# Patient Record
Sex: Female | Born: 1935
Health system: Southern US, Community
[De-identification: ages and names within clinical notes are randomized; demographics above are authoritative.]

## PROBLEM LIST (undated history)

## (undated) DIAGNOSIS — M1990 Unspecified osteoarthritis, unspecified site: Secondary | ICD-10-CM

## (undated) DIAGNOSIS — I5022 Chronic systolic (congestive) heart failure: Secondary | ICD-10-CM

## (undated) DIAGNOSIS — N289 Disorder of kidney and ureter, unspecified: Secondary | ICD-10-CM

## (undated) DIAGNOSIS — N183 Chronic kidney disease, stage 3 unspecified: Secondary | ICD-10-CM

## (undated) DIAGNOSIS — K219 Gastro-esophageal reflux disease without esophagitis: Secondary | ICD-10-CM

## (undated) DIAGNOSIS — K5710 Diverticulosis of small intestine without perforation or abscess without bleeding: Secondary | ICD-10-CM

## (undated) DIAGNOSIS — I480 Paroxysmal atrial fibrillation: Secondary | ICD-10-CM

## (undated) DIAGNOSIS — R569 Unspecified convulsions: Secondary | ICD-10-CM

## (undated) DIAGNOSIS — E119 Type 2 diabetes mellitus without complications: Secondary | ICD-10-CM

## (undated) DIAGNOSIS — E669 Obesity, unspecified: Secondary | ICD-10-CM

## (undated) DIAGNOSIS — I10 Essential (primary) hypertension: Secondary | ICD-10-CM

## (undated) DIAGNOSIS — R600 Localized edema: Secondary | ICD-10-CM

## (undated) DIAGNOSIS — K5790 Diverticulosis of intestine, part unspecified, without perforation or abscess without bleeding: Secondary | ICD-10-CM

## (undated) DIAGNOSIS — K8050 Calculus of bile duct without cholangitis or cholecystitis without obstruction: Secondary | ICD-10-CM

## (undated) DIAGNOSIS — M8580 Other specified disorders of bone density and structure, unspecified site: Secondary | ICD-10-CM

## (undated) DIAGNOSIS — D126 Benign neoplasm of colon, unspecified: Secondary | ICD-10-CM

## (undated) DIAGNOSIS — Z8719 Personal history of other diseases of the digestive system: Secondary | ICD-10-CM

## (undated) HISTORY — DX: Diverticulosis of intestine, part unspecified, without perforation or abscess without bleeding: K57.90

## (undated) HISTORY — DX: Chronic kidney disease, stage 3 (moderate): N18.3

## (undated) HISTORY — DX: Chronic kidney disease, stage 3 unspecified: N18.30

## (undated) HISTORY — DX: Localized edema: R60.0

## (undated) HISTORY — DX: Benign neoplasm of colon, unspecified: D12.6

## (undated) HISTORY — DX: Gastro-esophageal reflux disease without esophagitis: K21.9

## (undated) HISTORY — DX: Essential (primary) hypertension: I10

## (undated) HISTORY — DX: Obesity, unspecified: E66.9

## (undated) HISTORY — PX: APPENDECTOMY: SHX54

## (undated) HISTORY — DX: Unspecified convulsions: R56.9

## (undated) HISTORY — DX: Type 2 diabetes mellitus without complications: E11.9

## (undated) HISTORY — PX: TONSILLECTOMY: SUR1361

## (undated) HISTORY — DX: Other specified disorders of bone density and structure, unspecified site: M85.80

## (undated) HISTORY — PX: ABDOMINAL HYSTERECTOMY: SHX81

---

## 1975-07-05 HISTORY — PX: CHOLECYSTECTOMY OPEN: SUR202

## 1998-05-20 ENCOUNTER — Ambulatory Visit (HOSPITAL_COMMUNITY): Admission: RE | Admit: 1998-05-20 | Discharge: 1998-05-20 | Payer: Self-pay | Admitting: Family Medicine

## 1999-07-13 ENCOUNTER — Encounter: Admission: RE | Admit: 1999-07-13 | Discharge: 1999-07-13 | Payer: Self-pay | Admitting: Family Medicine

## 1999-07-13 ENCOUNTER — Encounter: Payer: Self-pay | Admitting: Family Medicine

## 2001-07-19 ENCOUNTER — Encounter: Admission: RE | Admit: 2001-07-19 | Discharge: 2001-07-19 | Payer: Self-pay | Admitting: Family Medicine

## 2001-07-19 ENCOUNTER — Encounter: Payer: Self-pay | Admitting: Family Medicine

## 2001-11-30 ENCOUNTER — Encounter: Payer: Self-pay | Admitting: Family Medicine

## 2001-11-30 ENCOUNTER — Encounter: Admission: RE | Admit: 2001-11-30 | Discharge: 2001-11-30 | Payer: Self-pay | Admitting: Family Medicine

## 2002-05-14 ENCOUNTER — Encounter: Admission: RE | Admit: 2002-05-14 | Discharge: 2002-05-14 | Payer: Self-pay | Admitting: Family Medicine

## 2002-05-14 ENCOUNTER — Encounter: Payer: Self-pay | Admitting: Family Medicine

## 2003-07-17 ENCOUNTER — Encounter: Admission: RE | Admit: 2003-07-17 | Discharge: 2003-07-17 | Payer: Self-pay | Admitting: Family Medicine

## 2004-08-17 ENCOUNTER — Encounter: Admission: RE | Admit: 2004-08-17 | Discharge: 2004-08-17 | Payer: Self-pay | Admitting: Family Medicine

## 2005-09-14 ENCOUNTER — Encounter: Admission: RE | Admit: 2005-09-14 | Discharge: 2005-09-14 | Payer: Self-pay | Admitting: Family Medicine

## 2005-09-30 ENCOUNTER — Encounter: Admission: RE | Admit: 2005-09-30 | Discharge: 2005-09-30 | Payer: Self-pay | Admitting: Orthopaedic Surgery

## 2005-10-14 ENCOUNTER — Encounter: Admission: RE | Admit: 2005-10-14 | Discharge: 2005-10-14 | Payer: Self-pay | Admitting: Orthopaedic Surgery

## 2005-10-19 ENCOUNTER — Encounter: Admission: RE | Admit: 2005-10-19 | Discharge: 2005-10-19 | Payer: Self-pay | Admitting: Orthopaedic Surgery

## 2005-11-02 ENCOUNTER — Encounter: Admission: RE | Admit: 2005-11-02 | Discharge: 2005-11-02 | Payer: Self-pay | Admitting: Orthopaedic Surgery

## 2006-09-14 ENCOUNTER — Encounter: Admission: RE | Admit: 2006-09-14 | Discharge: 2006-09-14 | Payer: Self-pay | Admitting: Family Medicine

## 2007-06-09 ENCOUNTER — Encounter: Admission: RE | Admit: 2007-06-09 | Discharge: 2007-06-09 | Payer: Self-pay | Admitting: Family Medicine

## 2007-07-05 DIAGNOSIS — D126 Benign neoplasm of colon, unspecified: Secondary | ICD-10-CM

## 2007-07-05 HISTORY — DX: Benign neoplasm of colon, unspecified: D12.6

## 2007-10-09 ENCOUNTER — Ambulatory Visit: Payer: Self-pay | Admitting: Gastroenterology

## 2007-11-01 ENCOUNTER — Ambulatory Visit: Payer: Self-pay | Admitting: Gastroenterology

## 2007-11-01 ENCOUNTER — Encounter: Payer: Self-pay | Admitting: Gastroenterology

## 2007-11-07 ENCOUNTER — Encounter: Payer: Self-pay | Admitting: Gastroenterology

## 2008-11-27 ENCOUNTER — Ambulatory Visit: Payer: Self-pay

## 2008-12-09 ENCOUNTER — Ambulatory Visit: Payer: Self-pay | Admitting: Family Medicine

## 2009-06-22 ENCOUNTER — Ambulatory Visit: Payer: Self-pay | Admitting: Family Medicine

## 2012-10-10 ENCOUNTER — Encounter: Payer: Self-pay | Admitting: Gastroenterology

## 2013-09-10 ENCOUNTER — Telehealth: Payer: Self-pay | Admitting: Cardiovascular Disease

## 2013-09-10 ENCOUNTER — Ambulatory Visit (INDEPENDENT_AMBULATORY_CARE_PROVIDER_SITE_OTHER): Payer: Commercial Managed Care - HMO | Admitting: Cardiovascular Disease

## 2013-09-10 ENCOUNTER — Encounter: Payer: Self-pay | Admitting: Cardiovascular Disease

## 2013-09-10 VITALS — BP 142/70 | HR 84 | Ht 62.0 in | Wt 290.0 lb

## 2013-09-10 DIAGNOSIS — I10 Essential (primary) hypertension: Secondary | ICD-10-CM | POA: Insufficient documentation

## 2013-09-10 DIAGNOSIS — R609 Edema, unspecified: Secondary | ICD-10-CM

## 2013-09-10 DIAGNOSIS — R600 Localized edema: Secondary | ICD-10-CM | POA: Insufficient documentation

## 2013-09-10 DIAGNOSIS — E119 Type 2 diabetes mellitus without complications: Secondary | ICD-10-CM

## 2013-09-10 MED ORDER — FUROSEMIDE 20 MG PO TABS: 20.0000 mg | ORAL_TABLET | Freq: Every day | ORAL | Status: DC

## 2013-09-10 MED ORDER — POTASSIUM CHLORIDE ER 10 MEQ PO TBCR: 10.0000 meq | EXTENDED_RELEASE_TABLET | Freq: Every day | ORAL | Status: DC

## 2013-09-10 NOTE — Progress Notes (Signed)
09/10/2013 Pamela [name]   1935-06-12  [id number]  Primary Physician Orpah Melter, MD Primary Cardiologist: Lorretta Harp MD Renae Gloss   HPI:  Pamela [name] is a delightful 79 year old severely overweight married Caucasian female mother of 2 children, grandmother and 2 grandchildren referred by Dr. Trellis Paganini at [facility] for cardiovascular evaluation of left greater than right lower extremity edema. Her cardiac risk factor profile is remarkable for treated diabetes and hypertension. Otherwise she is a remote smoker having stopped 25 years ago. There is no family history of heart disease. She's never had a heart attack or stroke. She denies chest pain but does get some dyspnea on exertion. She developed left greater than right lower extremity edema over the last 3-4 months along with some dyspnea. She also has a superimposed cellulitis on the left which is somewhat pruritic. Dr. Geroge Baseman treated her with an oral antibiotic and compression stockings. She does admit to some dietary indiscretion with regards to salt.   Current Outpatient Prescriptions  Medication Sig Dispense Refill  . Ascorbic Acid (VITAMIN C) 1000 MG tablet Take 1,000 mg by mouth daily.      . Calcium Carb-Cholecalciferol (CALCIUM + D3 PO) Take 1,000 mg by mouth daily.      Marland Kitchen losartan-hydrochlorothiazide (HYZAAR) 50-12.5 MG per tablet Take 1 tablet by mouth daily.      . Omega-3 Fatty Acids (FISH OIL PO) Take 2,000 mg by mouth 2 (two) times daily.      . pioglitazone-metformin (ACTOPLUS MET) 15-500 MG per tablet Take 1 tablet by mouth 2 (two) times daily with a meal.       No current facility-administered medications for this visit.    Not on File  History   Social History  . Marital Status: Married    Spouse Name: N/A    Number of Children: N/A  . Years of Education: N/A   Occupational History  . Not on file.   Social History Main Topics  . Smoking status: Former Research scientist (life sciences)  .  Smokeless tobacco: Not on file  . Alcohol Use: Not on file  . Drug Use: Not on file  . Sexual Activity: Not on file   Other Topics Concern  . Not on file   Social History Narrative  . No narrative on file     Review of Systems: General: negative for chills, fever, night sweats or weight changes.  Cardiovascular: negative for chest pain, dyspnea on exertion, edema, orthopnea, palpitations, paroxysmal nocturnal dyspnea or shortness of breath Dermatological: negative for rash Respiratory: negative for cough or wheezing Urologic: negative for hematuria Abdominal: negative for nausea, vomiting, diarrhea, bright red blood per rectum, melena, or hematemesis Neurologic: negative for visual changes, syncope, or dizziness All other systems reviewed and are otherwise negative except as noted above.    Blood pressure 142/70, pulse 84, height _0  (1.575 m), weight 131.543 kg (290 lb).  General appearance: alert and no distress Neck: no adenopathy, no carotid bruit, no JVD, supple, symmetrical, trachea midline and thyroid not enlarged, symmetric, no tenderness/mass/nodules Lungs: clear to auscultation bilaterally Heart: regular rate and rhythm, S1, S2 normal, no murmur, click, rub or gallop Extremities: 3+ edema left, 1-2+ edema on the right. Some penicillins in the left. ddiminished pedal pulses  EKG normal sinus rhythm at 91 without ST or T wave changes  ASSESSMENT AND PLAN:   Essential hypertension Under good control on current medications  Bilateral lower extremity edema The patient was referred by  Dr. Trellis Paganini at [facility]  center for evaluation of left greater than right lower extremity edema with superimposed cellulitis on the left. Edema began 3-4 months ago. She does have some dyspnea on exertion. She denies claudication. Intimate is worse on the left. She does admit to dietary indiscretion with regard to salt. She does have a superimposed cellulitis of the left for which  he was being treated with an antibiotic by Dr. Geroge Baseman. Compression stockings were also prescribed period I'm going to obtain a 2-D echo as well as lower extremity venous Dopplers to rule out DVT and/or reflux. I'm also going to add a low-dose diuretic.      Lorretta Harp MD FACP,FACC,FAHA, [facility] 09/10/2013 9:41 AM

## 2013-09-10 NOTE — Telephone Encounter (Signed)
Returned call and pt verified x 2 w/ Thayer Jew, pt's husband.  Stated pt seems to think the prescription dosages were switched around.  Stated pt should be on 10 mg of one and 20 mg of the other and the way they were sent to pharmacy is wrong.    RN reviewed OV note from today and directions stated pt to take Lasix 20 mg daily and K+ 10 mEq daily.  Confirmed directions w/ Curt Bears, RN and informed pt/husband.  Verbalized understanding and agreed w/ plan.

## 2013-09-10 NOTE — Telephone Encounter (Signed)
Pt went to pick up her medicine it was the wrong prescription.

## 2013-09-10 NOTE — Assessment & Plan Note (Signed)
The patient was referred by Dr. Trellis Paganini at [facility]  center for evaluation of left greater than right lower extremity edema with superimposed cellulitis on the left. Edema began 3-4 months ago. She does have some dyspnea on exertion. She denies claudication. Intimate is worse on the left. She does admit to dietary indiscretion with regard to salt. She does have a superimposed cellulitis of the left for which he was being treated with an antibiotic by Dr. Geroge Baseman. Compression stockings were also prescribed period I'm going to obtain a 2-D echo as well as lower extremity venous Dopplers to rule out DVT and/or reflux. I'm also going to add a low-dose diuretic.

## 2013-09-10 NOTE — Patient Instructions (Signed)
  Your physician wants you to follow-up with him after the tests.                                                                 You will receive a reminder letter in the mail one month in advance. If you don't receive a letter, please call our office to schedule the follow-up appointment.    Your physician has recommended you make the following change in your medication: Start lasix 20mg  daily and potassium choloride 40meg daily.    Your physician has ordered the following tests: echocardiogram and lower extremity venous dopplers with reflux study.

## 2013-09-10 NOTE — Assessment & Plan Note (Signed)
Under good control on current medications 

## 2013-09-19 ENCOUNTER — Ambulatory Visit (HOSPITAL_COMMUNITY)
Admission: RE | Admit: 2013-09-19 | Discharge: 2013-09-19 | Disposition: A | Discharge status: Home / Self Care | Payer: Medicare HMO | Source: Ambulatory Visit | Attending: Cardiovascular Disease | Admitting: Cardiovascular Disease

## 2013-09-19 ENCOUNTER — Ambulatory Visit (HOSPITAL_BASED_OUTPATIENT_CLINIC_OR_DEPARTMENT_OTHER)
Admission: RE | Admit: 2013-09-19 | Discharge: 2013-09-19 | Disposition: A | Discharge status: Home / Self Care | Payer: Medicare HMO | Source: Ambulatory Visit | Attending: Cardiovascular Disease | Admitting: Cardiovascular Disease

## 2013-09-19 DIAGNOSIS — R0989 Other specified symptoms and signs involving the circulatory and respiratory systems: Principal | ICD-10-CM | POA: Insufficient documentation

## 2013-09-19 DIAGNOSIS — R600 Localized edema: Secondary | ICD-10-CM

## 2013-09-19 DIAGNOSIS — I517 Cardiomegaly: Secondary | ICD-10-CM

## 2013-09-19 DIAGNOSIS — R609 Edema, unspecified: Secondary | ICD-10-CM

## 2013-09-19 DIAGNOSIS — R0609 Other forms of dyspnea: Secondary | ICD-10-CM | POA: Insufficient documentation

## 2013-09-19 DIAGNOSIS — M7989 Other specified soft tissue disorders: Secondary | ICD-10-CM

## 2013-09-19 NOTE — Progress Notes (Signed)
2D Echo Performed 09/19/2013    Tashan Kreitzer, RCS  

## 2013-09-19 NOTE — Progress Notes (Signed)
Venous Duplex Lower Ext. Completed. Negative for DVT and venous insuffiencey. Oda Cogan, BS, RDMS, RVT

## 2013-09-23 ENCOUNTER — Other Ambulatory Visit: Payer: Self-pay | Admitting: Dermatology

## 2014-06-06 ENCOUNTER — Encounter: Payer: Self-pay | Admitting: Gastroenterology

## 2014-07-14 ENCOUNTER — Telehealth: Payer: Self-pay

## 2014-07-14 NOTE — Telephone Encounter (Signed)
Prefer office visit with me or APP to evaluate. I don't see any GI records in Epic.

## 2014-07-14 NOTE — Telephone Encounter (Signed)
Last BMI >50.  If pt still over do you want direct hospital appt for procedure or OV?

## 2014-07-16 ENCOUNTER — Encounter: Payer: Self-pay | Admitting: Gastroenterology

## 2014-07-16 ENCOUNTER — Ambulatory Visit: Payer: Medicare HMO | Admitting: Gastroenterology

## 2014-07-25 ENCOUNTER — Encounter: Payer: Medicare HMO | Admitting: Gastroenterology

## 2014-09-03 ENCOUNTER — Encounter: Payer: Self-pay | Admitting: Gastroenterology

## 2014-09-03 ENCOUNTER — Ambulatory Visit (INDEPENDENT_AMBULATORY_CARE_PROVIDER_SITE_OTHER): Payer: Commercial Managed Care - HMO | Admitting: Gastroenterology

## 2014-09-03 VITALS — BP 132/76 | HR 85 | Ht 62.0 in | Wt 285.0 lb

## 2014-09-03 DIAGNOSIS — Z86010 Personal history of colonic polyps: Secondary | ICD-10-CM | POA: Diagnosis not present

## 2014-09-03 DIAGNOSIS — R194 Change in bowel habit: Secondary | ICD-10-CM

## 2014-09-03 NOTE — Progress Notes (Signed)
History of Present Illness: This is a 80 year old female referred by Orpah Melter, MD for a history of adenomatous colon polyps.  She is accompanied by her husband. She relates a change in bowel habits last week with slightly looser stools for several days. Her bowel habits have now returned to their normal pattern. Denies weight loss, abdominal pain, constipation, diarrhea, change in stool caliber, melena, hematochezia, nausea, vomiting, dysphagia, reflux symptoms, chest pain.   Allergies  Allergen Reactions  . Levaquin [Levofloxacin In D5w] Nausea And Vomiting   Outpatient Prescriptions Prior to Visit  Medication Sig Dispense Refill  . Ascorbic Acid (VITAMIN C) 1000 MG tablet Take 1,000 mg by mouth daily.    . Calcium Carb-Cholecalciferol (CALCIUM + D3 PO) Take 1,000 mg by mouth daily.    Marland Kitchen losartan-hydrochlorothiazide (HYZAAR) 50-12.5 MG per tablet Take 1 tablet by mouth daily.    . Omega-3 Fatty Acids (FISH OIL PO) Take 2,000 mg by mouth 2 (two) times daily.    . pioglitazone-metformin (ACTOPLUS MET) 15-500 MG per tablet Take 1 tablet by mouth 2 (two) times daily with a meal.    . potassium chloride (K-DUR) 10 MEQ tablet Take 1 tablet (10 mEq total) by mouth daily. 30 tablet 11  . furosemide (LASIX) 20 MG tablet Take 1 tablet (20 mg total) by mouth daily. 30 tablet 11   No facility-administered medications prior to visit.   Past Medical History  Diagnosis Date  . Bilateral lower extremity edema     left lower extremity cellulitis  . Hypertension   . Diabetes   . Obesity   . Tubular adenoma of colon 2009  . Kidney disease, chronic, stage III (GFR 30-59 ml/min)   . Degenerative joint disease   . GERD (gastroesophageal reflux disease)   . Osteopenia   . Diverticulosis   . Seizures    History reviewed. No pertinent past surgical history. History   Social History  . Marital Status: Married    Spouse Name: N/A  . Number of Children: N/A  . Years of Education: N/A    Social History Main Topics  . Smoking status: Former Research scientist (life sciences)  . Smokeless tobacco: Not on file  . Alcohol Use: No  . Drug Use: No  . Sexual Activity: Not on file   Other Topics Concern  . None   Social History Narrative   History reviewed. No pertinent family history.     Review of Systems: Pertinent positive and negative review of systems were noted in the above HPI section. All other review of systems were otherwise negative.    Physical Exam: General: Well developed, well nourished, obese, no acute distress Head: Normocephalic and atraumatic Eyes:  sclerae anicteric, EOMI Ears: Normal auditory acuity Mouth: No deformity or lesions Neck: Supple, no masses or thyromegaly Lungs: Clear throughout to auscultation Heart: Regular rate and rhythm; no murmurs, rubs or bruits Abdomen: Soft, non tender and non distended. No masses, hepatosplenomegaly or hernias noted. Normal Bowel sounds Rectal: deferred to colonoscopy Musculoskeletal: Symmetrical with no gross deformities  Skin: No lesions on visible extremities Pulses:  Normal pulses noted Extremities: No clubbing, cyanosis, edema or deformities noted Neurological: Alert oriented x 4, grossly nonfocal Cervical Nodes:  No significant cervical adenopathy Inguinal Nodes: No significant inguinal adenopathy Psychological:  Alert and cooperative. Normal mood and affect  Assessment and Recommendations:  1. Personal history of adenomatous colon polyp. We discussed the option of proceeding with surveillance colonoscopy. Given that she is 43 and had  only one small adenomatous polyp a 5-10 year surveillance interval would be reasonable. She would be 83 at the 10 year interval and she has passed the 5 year interval. After considering the options she would like to proceed with colonoscopy. The risks (including bleeding, perforation, infection, missed lesions, medication reactions and possible hospitalization or surgery if complications  occur), benefits, and alternatives to colonoscopy with possible biopsy and possible polypectomy were discussed with the patient and they consent to proceed.   2. Morbid obesity, BMI=52. Encouraged long-term weight loss program supervised by her PCP. Colonoscopy scheduled at the hospital due to BMI over 50.  3. Transient, self-limited change in bowel habits likely related to viral illness or food sensitivity. These symptoms do not warrant colonoscopy.  cc: Dr. Orpah Melter

## 2014-09-03 NOTE — Patient Instructions (Signed)
You have been scheduled for a colonoscopy. Please follow written instructions given to you at your visit today.  Please pick up your prep supplies at the pharmacy within the next 1-3 days. If you use inhalers (even only as needed), please bring them with you on the day of your procedure.  Thank you for choosing me and [facility].  Malcolm T. Stark, Jr., MD., FACG  

## 2014-09-15 ENCOUNTER — Ambulatory Visit (HOSPITAL_BASED_OUTPATIENT_CLINIC_OR_DEPARTMENT_OTHER)
Admission: RE | Admit: 2014-09-15 | Discharge: 2014-09-15 | Disposition: A | Discharge status: Home / Self Care | Payer: Commercial Managed Care - HMO | Source: Ambulatory Visit | Attending: Family Medicine | Admitting: Family Medicine

## 2014-09-15 ENCOUNTER — Other Ambulatory Visit (HOSPITAL_BASED_OUTPATIENT_CLINIC_OR_DEPARTMENT_OTHER): Payer: Self-pay | Admitting: Family Medicine

## 2014-09-15 DIAGNOSIS — R1030 Lower abdominal pain, unspecified: Secondary | ICD-10-CM | POA: Diagnosis not present

## 2014-09-15 DIAGNOSIS — K625 Hemorrhage of anus and rectum: Secondary | ICD-10-CM | POA: Diagnosis not present

## 2014-09-15 DIAGNOSIS — R109 Unspecified abdominal pain: Secondary | ICD-10-CM | POA: Diagnosis not present

## 2014-09-15 DIAGNOSIS — R140 Abdominal distension (gaseous): Secondary | ICD-10-CM | POA: Insufficient documentation

## 2014-09-15 DIAGNOSIS — R194 Change in bowel habit: Secondary | ICD-10-CM | POA: Diagnosis not present

## 2014-09-23 DIAGNOSIS — R194 Change in bowel habit: Secondary | ICD-10-CM | POA: Diagnosis not present

## 2014-10-03 ENCOUNTER — Encounter (HOSPITAL_COMMUNITY): Payer: Self-pay | Admitting: *Deleted

## 2014-10-16 ENCOUNTER — Encounter (HOSPITAL_COMMUNITY): Payer: Self-pay | Admitting: *Deleted

## 2014-10-16 ENCOUNTER — Ambulatory Visit (HOSPITAL_COMMUNITY): Payer: Commercial Managed Care - HMO | Admitting: Anesthesiology

## 2014-10-16 ENCOUNTER — Encounter (HOSPITAL_COMMUNITY)
Admission: RE | Disposition: A | Discharge status: Home / Self Care | Payer: Self-pay | Source: Ambulatory Visit | Attending: Gastroenterology

## 2014-10-16 ENCOUNTER — Ambulatory Visit (HOSPITAL_COMMUNITY)
Admission: RE | Admit: 2014-10-16 | Discharge: 2014-10-16 | Disposition: A | Discharge status: Home / Self Care | Payer: Commercial Managed Care - HMO | Source: Ambulatory Visit | Attending: Gastroenterology | Admitting: Gastroenterology

## 2014-10-16 DIAGNOSIS — K219 Gastro-esophageal reflux disease without esophagitis: Secondary | ICD-10-CM | POA: Insufficient documentation

## 2014-10-16 DIAGNOSIS — N183 Chronic kidney disease, stage 3 (moderate): Secondary | ICD-10-CM | POA: Diagnosis not present

## 2014-10-16 DIAGNOSIS — Z1211 Encounter for screening for malignant neoplasm of colon: Secondary | ICD-10-CM | POA: Diagnosis not present

## 2014-10-16 DIAGNOSIS — K5790 Diverticulosis of intestine, part unspecified, without perforation or abscess without bleeding: Secondary | ICD-10-CM | POA: Diagnosis not present

## 2014-10-16 DIAGNOSIS — Z86010 Personal history of colonic polyps: Secondary | ICD-10-CM | POA: Diagnosis not present

## 2014-10-16 DIAGNOSIS — Z860101 Personal history of adenomatous and serrated colon polyps: Secondary | ICD-10-CM | POA: Insufficient documentation

## 2014-10-16 DIAGNOSIS — E119 Type 2 diabetes mellitus without complications: Secondary | ICD-10-CM | POA: Insufficient documentation

## 2014-10-16 DIAGNOSIS — I129 Hypertensive chronic kidney disease with stage 1 through stage 4 chronic kidney disease, or unspecified chronic kidney disease: Secondary | ICD-10-CM | POA: Diagnosis not present

## 2014-10-16 DIAGNOSIS — K648 Other hemorrhoids: Secondary | ICD-10-CM | POA: Diagnosis not present

## 2014-10-16 DIAGNOSIS — K640 First degree hemorrhoids: Secondary | ICD-10-CM | POA: Insufficient documentation

## 2014-10-16 DIAGNOSIS — K5730 Diverticulosis of large intestine without perforation or abscess without bleeding: Secondary | ICD-10-CM | POA: Diagnosis not present

## 2014-10-16 DIAGNOSIS — Z87891 Personal history of nicotine dependence: Secondary | ICD-10-CM | POA: Insufficient documentation

## 2014-10-16 DIAGNOSIS — Z6841 Body Mass Index (BMI) 40.0 and over, adult: Secondary | ICD-10-CM | POA: Diagnosis not present

## 2014-10-16 DIAGNOSIS — E669 Obesity, unspecified: Secondary | ICD-10-CM | POA: Diagnosis not present

## 2014-10-16 HISTORY — PX: COLONOSCOPY WITH PROPOFOL: SHX5780

## 2014-10-16 LAB — GLUCOSE, CAPILLARY: GLUCOSE-CAPILLARY: 106 mg/dL — AB (ref 70–99)

## 2014-10-16 SURGERY — COLONOSCOPY WITH PROPOFOL
Anesthesia: Monitor Anesthesia Care

## 2014-10-16 MED ORDER — PROPOFOL 10 MG/ML IV BOLUS
INTRAVENOUS | Status: AC
  Filled 2014-10-16: qty 20

## 2014-10-16 MED ORDER — SODIUM CHLORIDE 0.9 % IV SOLN: INTRAVENOUS | Status: DC

## 2014-10-16 MED ORDER — LACTATED RINGERS IV SOLN
INTRAVENOUS | Status: DC
  Administered 2014-10-16: 10:00:00 via INTRAVENOUS
  Administered 2014-10-16: 1000 mL via INTRAVENOUS

## 2014-10-16 MED ORDER — MIDAZOLAM HCL 5 MG/5ML IJ SOLN
INTRAMUSCULAR | Status: DC | PRN
  Administered 2014-10-16 (×2): 1 mg via INTRAVENOUS

## 2014-10-16 MED ORDER — PROPOFOL 10 MG/ML IV BOLUS
INTRAVENOUS | Status: DC | PRN
  Administered 2014-10-16 (×4): 60 mg via INTRAVENOUS

## 2014-10-16 MED ORDER — MIDAZOLAM HCL 2 MG/2ML IJ SOLN
INTRAMUSCULAR | Status: AC
  Filled 2014-10-16: qty 2

## 2014-10-16 SURGICAL SUPPLY — 22 items

## 2014-10-16 NOTE — Op Note (Signed)
[place], [id number]   COLONOSCOPY PROCEDURE REPORT  PATIENT: Pamela [name], Pamela [name]  MR#: [id number] BIRTHDATE: 11-26-1934 , 4  yrs. old GENDER: female ENDOSCOPIST: [name], MD, [facility] REFERRED YT:[id number] Olen Pel, M.D. PROCEDURE DATE:  10/16/2014 PROCEDURE:   Colonoscopy, surveillance First Screening Colonoscopy - Avg.  risk and is 50 yrs.  old or older - No.  Prior Negative Screening - Now for repeat screening. N/A  History of Adenoma - Now for follow-up colonoscopy & has been > or = to 3 yrs.  Yes hx of adenoma.  Has been 3 or more years since last colonoscopy. ASA CLASS:   Class III INDICATIONS:Surveillance due to prior colonic neoplasia and PH Colon Adenoma. MEDICATIONS: Monitored anesthesia care and Per Anesthesia DESCRIPTION OF PROCEDURE:   After the risks benefits and alternatives of the procedure were thoroughly explained, informed consent was obtained.  The digital rectal exam revealed no abnormalities of the rectum.   The Pentax Ped Colon [id number] endoscope was introduced through the anus and advanced to the cecum, which was identified by both the appendix and ileocecal valve. No adverse events experienced.   The quality of the prep was good.  (MoviPrep was used)  The instrument was then slowly withdrawn as the colon was fully examined.    COLON FINDINGS: There was moderate diverticulosis noted in the sigmoid colon and descending colon.   There was mild diverticulosis noted in the transverse colon.   The examination was otherwise normal.  Retroflexed views revealed internal Grade I hemorrhoids. The time to cecum = 2.9 Withdrawal time = 9.2   The scope was withdrawn and the procedure completed. COMPLICATIONS: There were no immediate complications.  ENDOSCOPIC IMPRESSION: 1.   Moderate diverticulosis in the sigmoid colon and descending colon 2.   Mild diverticulosis in the transverse colon 3.   Grade l internal  hemorrhoids  RECOMMENDATIONS: 1.  High fiber diet with liberal fluid intake. 2.  Given your age, you will not need another colonoscopy for colon cancer screening or polyp surveillance.  These types of tests usually stop around the age 16.  eSigned:  [name], MD, [facility] 10/16/2014 10:31 AM

## 2014-10-16 NOTE — Anesthesia Preprocedure Evaluation (Addendum)
Anesthesia Evaluation  Patient identified by MRN, date of birth, ID band Patient awake    Reviewed: Allergy & Precautions, NPO status , Patient's Chart, lab work & pertinent test results  Airway Mallampati: II  TM Distance: >3 FB Neck ROM: Full    Dental   Pulmonary former smoker,  breath sounds clear to auscultation        Cardiovascular hypertension, Rhythm:Regular Rate:Normal     Neuro/Psych Seizures -,     GI/Hepatic negative GI ROS, Neg liver ROS,   Endo/Other  diabetes  Renal/GU Renal disease     Musculoskeletal   Abdominal   Peds  Hematology   Anesthesia Other Findings   Reproductive/Obstetrics                            Anesthesia Physical Anesthesia Plan  ASA: III  Anesthesia Plan: MAC   Post-op Pain Management:    Induction: Intravenous  Airway Management Planned: Simple Face Mask  Additional Equipment:   Intra-op Plan:   Post-operative Plan:   Informed Consent:   Dental advisory given  Plan Discussed with: Anesthesiologist and CRNA  Anesthesia Plan Comments:         Anesthesia Quick Evaluation

## 2014-10-16 NOTE — Transfer of Care (Signed)
Immediate Anesthesia Transfer of Care Note  Patient: Pamela [name]  Procedure(s) Performed: Procedure(s): COLONOSCOPY WITH PROPOFOL (N/A)  Patient Location: PACU and Endoscopy Unit  Anesthesia Type:MAC  Level of Consciousness: awake, oriented, patient cooperative, lethargic and responds to stimulation  Airway & Oxygen Therapy: Patient Spontanous Breathing and Patient connected to face mask oxygen  Post-op Assessment: Report given to RN, Post -op Vital signs reviewed and stable and Patient moving all extremities  Post vital signs: Reviewed and stable  Last Vitals:  Filed Vitals:   10/16/14 0912  BP: 168/68  Temp: 36.5 C  Resp: 20    Complications: No apparent anesthesia complications

## 2014-10-16 NOTE — H&P (Signed)
History of Present Illness: This is a 81 year old female referred by Orpah Melter, MD for a history of adenomatous colon polyps. She is accompanied by her husband. She relates a change in bowel habits last week with slightly looser stools for several days. Her bowel habits have now returned to their normal pattern. Denies weight loss, abdominal pain, constipation, diarrhea, change in stool caliber, melena, hematochezia, nausea, vomiting, dysphagia, reflux symptoms, chest pain.   Allergies  Allergen Reactions  . Levaquin [Levofloxacin In D5w] Nausea And Vomiting   Outpatient Prescriptions Prior to Visit  Medication Sig Dispense Refill  . Ascorbic Acid (VITAMIN C) 1000 MG tablet Take 1,000 mg by mouth daily.    . Calcium Carb-Cholecalciferol (CALCIUM + D3 PO) Take 1,000 mg by mouth daily.    Marland Kitchen losartan-hydrochlorothiazide (HYZAAR) 50-12.5 MG per tablet Take 1 tablet by mouth daily.    . Omega-3 Fatty Acids (FISH OIL PO) Take 2,000 mg by mouth 2 (two) times daily.    . pioglitazone-metformin (ACTOPLUS MET) 15-500 MG per tablet Take 1 tablet by mouth 2 (two) times daily with a meal.    . potassium chloride (K-DUR) 10 MEQ tablet Take 1 tablet (10 mEq total) by mouth daily. 30 tablet 11  . furosemide (LASIX) 20 MG tablet Take 1 tablet (20 mg total) by mouth daily. 30 tablet 11   No facility-administered medications prior to visit.   Past Medical History  Diagnosis Date  . Bilateral lower extremity edema     left lower extremity cellulitis  . Hypertension   . Diabetes   . Obesity   . Tubular adenoma of colon 2009  . Kidney disease, chronic, stage III (GFR 30-59 ml/min)   . Degenerative joint disease   . GERD (gastroesophageal reflux disease)   . Osteopenia   . Diverticulosis   . Seizures    History reviewed. No pertinent past surgical history. History   Social History  . Marital Status:  Married    Spouse Name: N/A  . Number of Children: N/A  . Years of Education: N/A   Social History Main Topics  . Smoking status: Former Research scientist (life sciences)  . Smokeless tobacco: Not on file  . Alcohol Use: No  . Drug Use: No  . Sexual Activity: Not on file   Other Topics Concern  . None   Social History Narrative   History reviewed. No pertinent family history.    Review of Systems: Pertinent positive and negative review of systems were noted in the above HPI section. All other review of systems were otherwise negative.    Physical Exam: General: Well developed, well nourished, obese, no acute distress Head: Normocephalic and atraumatic Eyes: sclerae anicteric, EOMI Ears: Normal auditory acuity Mouth: No deformity or lesions Neck: Supple, no masses or thyromegaly Lungs: Clear throughout to auscultation Heart: Regular rate and rhythm; no murmurs, rubs or bruits Abdomen: Soft, non tender and non distended. No masses, hepatosplenomegaly or hernias noted. Normal Bowel sounds Rectal: deferred to colonoscopy Musculoskeletal: Symmetrical with no gross deformities  Skin: No lesions on visible extremities Pulses: Normal pulses noted Extremities: No clubbing, cyanosis, edema or deformities noted Neurological: Alert oriented x 4, grossly nonfocal Cervical Nodes: No significant cervical adenopathy Inguinal Nodes: No significant inguinal adenopathy Psychological: Alert and cooperative. Normal mood and affect  Assessment and Recommendations:  1. Personal history of adenomatous colon polyp. We discussed the option of proceeding with surveillance colonoscopy. Given that she is 39 and had only one small adenomatous polyp a 5-10 year  surveillance interval would be reasonable. She would be 83 at the 10 year interval and she has passed the 5 year interval. After considering the options she would like to proceed with colonoscopy. The risks (including  bleeding, perforation, infection, missed lesions, medication reactions and possible hospitalization or surgery if complications occur), benefits, and alternatives to colonoscopy with possible biopsy and possible polypectomy were discussed with the patient and they consent to proceed.   2. Morbid obesity, BMI=52. Encouraged long-term weight loss program supervised by her PCP. Colonoscopy scheduled at the hospital due to BMI over 50.  3. Transient, self-limited change in bowel habits likely related to viral illness or food sensitivity. These symptoms do not warrant colonoscopy.

## 2014-10-16 NOTE — Anesthesia Postprocedure Evaluation (Signed)
  Anesthesia Post-op Note  Patient: Pamela [name]  Procedure(s) Performed: Procedure(s): COLONOSCOPY WITH PROPOFOL (N/A)  Patient Location: PACU  Anesthesia Type:MAC  Level of Consciousness: awake  Airway and Oxygen Therapy: Patient Spontanous Breathing  Post-op Pain: mild  Post-op Assessment: Post-op Vital signs reviewed  Post-op Vital Signs: Reviewed  Last Vitals:  Filed Vitals:   10/16/14 1100  BP: 141/62  Pulse: 74  Temp:   Resp: 16    Complications: No apparent anesthesia complications

## 2014-10-17 ENCOUNTER — Encounter (HOSPITAL_COMMUNITY): Payer: Self-pay | Admitting: Gastroenterology

## 2014-10-20 DIAGNOSIS — N390 Urinary tract infection, site not specified: Secondary | ICD-10-CM | POA: Diagnosis not present

## 2014-10-23 DIAGNOSIS — E119 Type 2 diabetes mellitus without complications: Secondary | ICD-10-CM | POA: Diagnosis not present

## 2014-10-23 DIAGNOSIS — I10 Essential (primary) hypertension: Secondary | ICD-10-CM | POA: Diagnosis not present

## 2014-10-23 DIAGNOSIS — R197 Diarrhea, unspecified: Secondary | ICD-10-CM | POA: Diagnosis not present

## 2014-10-23 DIAGNOSIS — R10817 Generalized abdominal tenderness: Secondary | ICD-10-CM | POA: Diagnosis not present

## 2014-10-23 DIAGNOSIS — R1084 Generalized abdominal pain: Secondary | ICD-10-CM | POA: Diagnosis not present

## 2014-10-23 DIAGNOSIS — Z9049 Acquired absence of other specified parts of digestive tract: Secondary | ICD-10-CM | POA: Diagnosis not present

## 2014-10-23 DIAGNOSIS — K529 Noninfective gastroenteritis and colitis, unspecified: Secondary | ICD-10-CM | POA: Diagnosis not present

## 2014-10-23 DIAGNOSIS — I2510 Atherosclerotic heart disease of native coronary artery without angina pectoris: Secondary | ICD-10-CM | POA: Diagnosis not present

## 2014-10-23 DIAGNOSIS — Z881 Allergy status to other antibiotic agents status: Secondary | ICD-10-CM | POA: Diagnosis not present

## 2014-10-27 DIAGNOSIS — R197 Diarrhea, unspecified: Secondary | ICD-10-CM | POA: Diagnosis not present

## 2014-10-27 DIAGNOSIS — N390 Urinary tract infection, site not specified: Secondary | ICD-10-CM | POA: Diagnosis not present

## 2014-11-03 ENCOUNTER — Telehealth: Payer: Self-pay | Admitting: Gastroenterology

## 2014-11-03 NOTE — Telephone Encounter (Signed)
Patient had a colonoscopy with Dr. Fuller Plan on 10/16/14.  Went on a vacation to [place] after that and was seen at an Urgent care for diarrhea and UTI.  She was treated for a UTI for approx 2 weeks and stopped the antibiotics last week Thursday.  She has seen her primary care and he repeated a culture and it was negative.  She reports that she is having bloating, not able to eat, and "I just feel bad".  It was recommended by her primary to come back here.  She will bring the records from the Urgent Cares in [place].  She will ask her primary care to send copies of their office note from last week (not in Care Everywhere).  She will come in and see Tye Savoy RNP tomorrow at 10:30

## 2014-11-04 ENCOUNTER — Encounter: Payer: Self-pay | Admitting: Nurse Practitioner

## 2014-11-04 ENCOUNTER — Ambulatory Visit (INDEPENDENT_AMBULATORY_CARE_PROVIDER_SITE_OTHER): Payer: Commercial Managed Care - HMO | Admitting: Nurse Practitioner

## 2014-11-04 VITALS — BP 120/60 | HR 84 | Ht 62.0 in | Wt 273.1 lb

## 2014-11-04 DIAGNOSIS — R140 Abdominal distension (gaseous): Secondary | ICD-10-CM | POA: Diagnosis not present

## 2014-11-04 MED ORDER — METRONIDAZOLE 500 MG PO TABS: 500.0000 mg | ORAL_TABLET | Freq: Three times a day (TID) | ORAL | Status: DC

## 2014-11-04 NOTE — Patient Instructions (Signed)
We have sent the following medications to your pharmacy for you to pick up at your convenience: Flagyl  Please purchase the following medications over the counter and take as directed: Align  Please call our office in 10 days with an update on your condition.

## 2014-11-04 NOTE — Progress Notes (Signed)
     History of Present Illness:  Patient is a 80 year old female known to Dr. Fuller Plan. She underwent surveillance colonoscopy for history of polyps on 10/16/14. Findings included revealed diverticulosis and grade 1 internal hemorrhoids.   Patient comes in today for evaluation of bowel changes she believes began following colonoscopy. Two days following colonoscopy patient began having diarrhea followed by lower abdominal pain and bloating. She was vacationing in [place], went to an Urgent Care. Diagnosed with UTI and given Macrobid.  Diarrhea and abdominal pain persisted, patient was seen at [facility] ED in in [place] 10/23/14. Plain films of the abdomen were unremarkable (report reviewed). White count normal but mildly elevated absolute neutrophil count of 12 . Creatinine mildly elevated at 1.55. Liver function studies were normal.  Patient was prescribed Lomotil. Of note, urine culture showed 50-100,000 Escherichia coli sensitive to nitrofurantoin. At some point she was also given Bactrim A urine culture by PCP was negative so Bactrim stopped. Patient has had no diarrhea or abdominal pain in 5 days now. Her main complaint is that of bloating. No nausea or vomiting. Her appetite is poor and her back hurts.  Current Medications, Allergies, Past Medical History, Past Surgical History, Family History and Social History were reviewed in [employer] record.  Physical Exam: General: Pleasant, well developed , white female in no acute distress Head: Normocephalic and atraumatic Eyes:  sclerae anicteric, conjunctiva pink  Ears: Normal auditory acuity Lungs: Clear throughout to auscultation Heart: Regular rate and rhythm Abdomen: Soft, obese, non distended, non-tender. No masses, no hepatomegaly. Normal bowel sounds Musculoskeletal: Symmetrical with no gross deformities  Extremities: No edema  Neurological: Alert oriented x 4, grossly nonfocal Psychological:   Alert and cooperative. Normal mood and affect  Assessment and Recommendations:   80 year old female here for evaluation of diarrhea, abdominal pain and bloating following colonoscopy for 10/16/14.  Of note, I have records from PCP in [facility]. She had a negative stool C. difficile toxin and stool culture  09/27/14. Studies predated colonoscopy and patient does now recall that she was having some loose stools prior to colonoscopy. In fact, at her March appointment with [place] she mentioned loose stools. Diarrhea and abdominal pain have resolved but patient complaining of significant bloating.  Etiology of bloating unclear, post-infectious IBS? Patient is bothered enough by the bloating so will treat empirically with metronidazole. Patient will call us in a week to 10 days if bloating.doesn't improve

## 2014-11-05 ENCOUNTER — Encounter: Payer: Self-pay | Admitting: Nurse Practitioner

## 2014-11-05 NOTE — Progress Notes (Signed)
Reviewed and agree with management plan.  Malcolm T. Stark, MD FACG 

## 2014-11-07 ENCOUNTER — Telehealth: Payer: Self-pay | Admitting: Nurse Practitioner

## 2014-11-07 NOTE — Telephone Encounter (Signed)
I reviewed a bland diet with her husband.  They will call back for any additional questions or concerns

## 2014-11-07 NOTE — Telephone Encounter (Signed)
Patient's husband reports that she is not improved on flagyl.  I explained that they need to give the flagyl a few more days.  She has only taken it for 2 full days.  I advised that she try a bland diet and if no better by next Wed please call back.

## 2014-11-07 NOTE — Telephone Encounter (Signed)
Phone line is busy

## 2014-11-09 NOTE — Telephone Encounter (Signed)
Agree. If not better, please get her in to see primary GI doc. Thanks

## 2014-11-14 ENCOUNTER — Telehealth: Payer: Self-pay | Admitting: Nurse Practitioner

## 2014-11-14 NOTE — Telephone Encounter (Signed)
Patient's husband reports that she has improved with flagyl. The bloating has improved but she has no energy.  She took her last dose today.  All the cramping and bloating has resolved.

## 2014-11-17 NOTE — Telephone Encounter (Signed)
Pamela [name], glad GI symptoms better. I don't why she is tired. Thanks

## 2014-11-21 DIAGNOSIS — E1122 Type 2 diabetes mellitus with diabetic chronic kidney disease: Secondary | ICD-10-CM | POA: Diagnosis not present

## 2014-11-21 DIAGNOSIS — R609 Edema, unspecified: Secondary | ICD-10-CM | POA: Diagnosis not present

## 2014-11-21 DIAGNOSIS — E780 Pure hypercholesterolemia: Secondary | ICD-10-CM | POA: Diagnosis not present

## 2014-11-21 DIAGNOSIS — I129 Hypertensive chronic kidney disease with stage 1 through stage 4 chronic kidney disease, or unspecified chronic kidney disease: Secondary | ICD-10-CM | POA: Diagnosis not present

## 2014-11-21 DIAGNOSIS — N183 Chronic kidney disease, stage 3 (moderate): Secondary | ICD-10-CM | POA: Diagnosis not present

## 2014-11-21 DIAGNOSIS — D631 Anemia in chronic kidney disease: Secondary | ICD-10-CM | POA: Diagnosis not present

## 2014-11-21 DIAGNOSIS — N2581 Secondary hyperparathyroidism of renal origin: Secondary | ICD-10-CM | POA: Diagnosis not present

## 2014-12-17 DIAGNOSIS — N2581 Secondary hyperparathyroidism of renal origin: Secondary | ICD-10-CM | POA: Diagnosis not present

## 2014-12-17 DIAGNOSIS — N189 Chronic kidney disease, unspecified: Secondary | ICD-10-CM | POA: Diagnosis not present

## 2014-12-17 DIAGNOSIS — N183 Chronic kidney disease, stage 3 (moderate): Secondary | ICD-10-CM | POA: Diagnosis not present

## 2014-12-29 ENCOUNTER — Other Ambulatory Visit: Payer: Self-pay

## 2014-12-29 DIAGNOSIS — N2581 Secondary hyperparathyroidism of renal origin: Secondary | ICD-10-CM | POA: Diagnosis not present

## 2014-12-29 DIAGNOSIS — D631 Anemia in chronic kidney disease: Secondary | ICD-10-CM | POA: Diagnosis not present

## 2014-12-29 DIAGNOSIS — N183 Chronic kidney disease, stage 3 (moderate): Secondary | ICD-10-CM | POA: Diagnosis not present

## 2014-12-29 DIAGNOSIS — I129 Hypertensive chronic kidney disease with stage 1 through stage 4 chronic kidney disease, or unspecified chronic kidney disease: Secondary | ICD-10-CM | POA: Diagnosis not present

## 2015-01-28 DIAGNOSIS — E780 Pure hypercholesterolemia: Secondary | ICD-10-CM | POA: Diagnosis not present

## 2015-04-07 DIAGNOSIS — Z23 Encounter for immunization: Secondary | ICD-10-CM | POA: Diagnosis not present

## 2015-04-13 DIAGNOSIS — M1990 Unspecified osteoarthritis, unspecified site: Secondary | ICD-10-CM | POA: Diagnosis not present

## 2015-04-13 DIAGNOSIS — M84375A Stress fracture, left foot, initial encounter for fracture: Secondary | ICD-10-CM | POA: Diagnosis not present

## 2015-04-13 DIAGNOSIS — M659 Synovitis and tenosynovitis, unspecified: Secondary | ICD-10-CM | POA: Diagnosis not present

## 2015-04-13 DIAGNOSIS — M84374A Stress fracture, right foot, initial encounter for fracture: Secondary | ICD-10-CM | POA: Diagnosis not present

## 2015-04-21 DIAGNOSIS — M84375D Stress fracture, left foot, subsequent encounter for fracture with routine healing: Secondary | ICD-10-CM | POA: Diagnosis not present

## 2015-04-21 DIAGNOSIS — M659 Synovitis and tenosynovitis, unspecified: Secondary | ICD-10-CM | POA: Diagnosis not present

## 2015-04-27 DIAGNOSIS — E1122 Type 2 diabetes mellitus with diabetic chronic kidney disease: Secondary | ICD-10-CM | POA: Diagnosis not present

## 2015-05-05 DIAGNOSIS — M84375D Stress fracture, left foot, subsequent encounter for fracture with routine healing: Secondary | ICD-10-CM | POA: Diagnosis not present

## 2015-05-05 DIAGNOSIS — M659 Synovitis and tenosynovitis, unspecified: Secondary | ICD-10-CM | POA: Diagnosis not present

## 2015-05-11 DIAGNOSIS — S8991XA Unspecified injury of right lower leg, initial encounter: Secondary | ICD-10-CM | POA: Diagnosis not present

## 2015-05-11 DIAGNOSIS — I129 Hypertensive chronic kidney disease with stage 1 through stage 4 chronic kidney disease, or unspecified chronic kidney disease: Secondary | ICD-10-CM | POA: Diagnosis not present

## 2015-05-11 DIAGNOSIS — S8391XA Sprain of unspecified site of right knee, initial encounter: Secondary | ICD-10-CM | POA: Diagnosis not present

## 2015-05-20 DIAGNOSIS — Z1389 Encounter for screening for other disorder: Secondary | ICD-10-CM | POA: Diagnosis not present

## 2015-05-20 DIAGNOSIS — I129 Hypertensive chronic kidney disease with stage 1 through stage 4 chronic kidney disease, or unspecified chronic kidney disease: Secondary | ICD-10-CM | POA: Diagnosis not present

## 2015-05-20 DIAGNOSIS — R05 Cough: Secondary | ICD-10-CM | POA: Diagnosis not present

## 2015-05-20 DIAGNOSIS — E1122 Type 2 diabetes mellitus with diabetic chronic kidney disease: Secondary | ICD-10-CM | POA: Diagnosis not present

## 2015-05-20 DIAGNOSIS — E7800 Pure hypercholesterolemia, unspecified: Secondary | ICD-10-CM | POA: Diagnosis not present

## 2015-05-20 DIAGNOSIS — N183 Chronic kidney disease, stage 3 (moderate): Secondary | ICD-10-CM | POA: Diagnosis not present

## 2015-05-25 DIAGNOSIS — M84374D Stress fracture, right foot, subsequent encounter for fracture with routine healing: Secondary | ICD-10-CM | POA: Diagnosis not present

## 2015-05-25 DIAGNOSIS — M84375D Stress fracture, left foot, subsequent encounter for fracture with routine healing: Secondary | ICD-10-CM | POA: Diagnosis not present

## 2015-05-25 DIAGNOSIS — M659 Synovitis and tenosynovitis, unspecified: Secondary | ICD-10-CM | POA: Diagnosis not present

## 2015-06-01 DIAGNOSIS — M659 Synovitis and tenosynovitis, unspecified: Secondary | ICD-10-CM | POA: Diagnosis not present

## 2015-06-01 DIAGNOSIS — M84375D Stress fracture, left foot, subsequent encounter for fracture with routine healing: Secondary | ICD-10-CM | POA: Diagnosis not present

## 2015-06-01 DIAGNOSIS — M84374D Stress fracture, right foot, subsequent encounter for fracture with routine healing: Secondary | ICD-10-CM | POA: Diagnosis not present

## 2015-06-22 DIAGNOSIS — M84375D Stress fracture, left foot, subsequent encounter for fracture with routine healing: Secondary | ICD-10-CM | POA: Diagnosis not present

## 2015-06-22 DIAGNOSIS — M7752 Other enthesopathy of left foot: Secondary | ICD-10-CM | POA: Diagnosis not present

## 2015-06-22 DIAGNOSIS — M7751 Other enthesopathy of right foot: Secondary | ICD-10-CM | POA: Diagnosis not present

## 2015-06-22 DIAGNOSIS — M84374D Stress fracture, right foot, subsequent encounter for fracture with routine healing: Secondary | ICD-10-CM | POA: Diagnosis not present

## 2015-06-22 DIAGNOSIS — M659 Synovitis and tenosynovitis, unspecified: Secondary | ICD-10-CM | POA: Diagnosis not present

## 2015-07-01 DIAGNOSIS — R109 Unspecified abdominal pain: Secondary | ICD-10-CM | POA: Diagnosis not present

## 2015-07-20 DIAGNOSIS — M659 Synovitis and tenosynovitis, unspecified: Secondary | ICD-10-CM | POA: Diagnosis not present

## 2015-08-18 DIAGNOSIS — W19XXXA Unspecified fall, initial encounter: Secondary | ICD-10-CM | POA: Diagnosis not present

## 2015-08-18 DIAGNOSIS — M25562 Pain in left knee: Secondary | ICD-10-CM | POA: Diagnosis not present

## 2015-08-18 DIAGNOSIS — S8990XA Unspecified injury of unspecified lower leg, initial encounter: Secondary | ICD-10-CM | POA: Diagnosis not present

## 2015-08-19 DIAGNOSIS — M659 Synovitis and tenosynovitis, unspecified: Secondary | ICD-10-CM | POA: Diagnosis not present

## 2015-08-27 DIAGNOSIS — B029 Zoster without complications: Secondary | ICD-10-CM | POA: Diagnosis not present

## 2015-10-27 DIAGNOSIS — M20011 Mallet finger of right finger(s): Secondary | ICD-10-CM | POA: Diagnosis not present

## 2015-11-03 DIAGNOSIS — E1142 Type 2 diabetes mellitus with diabetic polyneuropathy: Secondary | ICD-10-CM | POA: Diagnosis not present

## 2015-11-03 DIAGNOSIS — M2012 Hallux valgus (acquired), left foot: Secondary | ICD-10-CM | POA: Diagnosis not present

## 2015-11-03 DIAGNOSIS — L84 Corns and callosities: Secondary | ICD-10-CM | POA: Diagnosis not present

## 2015-11-16 DIAGNOSIS — I700 Atherosclerosis of aorta: Secondary | ICD-10-CM | POA: Diagnosis not present

## 2015-11-16 DIAGNOSIS — M8580 Other specified disorders of bone density and structure, unspecified site: Secondary | ICD-10-CM | POA: Diagnosis not present

## 2015-11-16 DIAGNOSIS — I129 Hypertensive chronic kidney disease with stage 1 through stage 4 chronic kidney disease, or unspecified chronic kidney disease: Secondary | ICD-10-CM | POA: Diagnosis not present

## 2015-11-16 DIAGNOSIS — E7800 Pure hypercholesterolemia, unspecified: Secondary | ICD-10-CM | POA: Diagnosis not present

## 2015-11-16 DIAGNOSIS — N183 Chronic kidney disease, stage 3 (moderate): Secondary | ICD-10-CM | POA: Diagnosis not present

## 2015-11-16 DIAGNOSIS — N2581 Secondary hyperparathyroidism of renal origin: Secondary | ICD-10-CM | POA: Diagnosis not present

## 2015-11-16 DIAGNOSIS — E1122 Type 2 diabetes mellitus with diabetic chronic kidney disease: Secondary | ICD-10-CM | POA: Diagnosis not present

## 2015-11-16 DIAGNOSIS — M20011 Mallet finger of right finger(s): Secondary | ICD-10-CM | POA: Diagnosis not present

## 2016-02-02 DIAGNOSIS — R05 Cough: Secondary | ICD-10-CM | POA: Diagnosis not present

## 2016-02-02 DIAGNOSIS — R350 Frequency of micturition: Secondary | ICD-10-CM | POA: Diagnosis not present

## 2016-02-06 DIAGNOSIS — R5383 Other fatigue: Secondary | ICD-10-CM | POA: Diagnosis not present

## 2016-02-06 DIAGNOSIS — F411 Generalized anxiety disorder: Secondary | ICD-10-CM | POA: Diagnosis not present

## 2016-02-08 DIAGNOSIS — K529 Noninfective gastroenteritis and colitis, unspecified: Secondary | ICD-10-CM | POA: Diagnosis not present

## 2016-02-08 DIAGNOSIS — R195 Other fecal abnormalities: Secondary | ICD-10-CM | POA: Diagnosis not present

## 2016-02-08 DIAGNOSIS — E1122 Type 2 diabetes mellitus with diabetic chronic kidney disease: Secondary | ICD-10-CM | POA: Diagnosis not present

## 2016-02-08 DIAGNOSIS — F419 Anxiety disorder, unspecified: Secondary | ICD-10-CM | POA: Diagnosis not present

## 2016-02-08 DIAGNOSIS — R143 Flatulence: Secondary | ICD-10-CM | POA: Diagnosis not present

## 2016-02-08 DIAGNOSIS — R109 Unspecified abdominal pain: Secondary | ICD-10-CM | POA: Diagnosis not present

## 2016-02-09 DIAGNOSIS — R195 Other fecal abnormalities: Secondary | ICD-10-CM | POA: Diagnosis not present

## 2016-02-09 DIAGNOSIS — R109 Unspecified abdominal pain: Secondary | ICD-10-CM | POA: Diagnosis not present

## 2016-02-09 DIAGNOSIS — R143 Flatulence: Secondary | ICD-10-CM | POA: Diagnosis not present

## 2016-02-11 DIAGNOSIS — N289 Disorder of kidney and ureter, unspecified: Secondary | ICD-10-CM | POA: Diagnosis not present

## 2016-02-15 DIAGNOSIS — E1122 Type 2 diabetes mellitus with diabetic chronic kidney disease: Secondary | ICD-10-CM | POA: Diagnosis not present

## 2016-02-15 DIAGNOSIS — N179 Acute kidney failure, unspecified: Secondary | ICD-10-CM | POA: Diagnosis not present

## 2016-03-22 DIAGNOSIS — N183 Chronic kidney disease, stage 3 (moderate): Secondary | ICD-10-CM | POA: Diagnosis not present

## 2016-03-22 DIAGNOSIS — N2581 Secondary hyperparathyroidism of renal origin: Secondary | ICD-10-CM | POA: Diagnosis not present

## 2016-03-22 DIAGNOSIS — N189 Chronic kidney disease, unspecified: Secondary | ICD-10-CM | POA: Diagnosis not present

## 2016-03-31 DIAGNOSIS — N2581 Secondary hyperparathyroidism of renal origin: Secondary | ICD-10-CM | POA: Diagnosis not present

## 2016-03-31 DIAGNOSIS — N183 Chronic kidney disease, stage 3 (moderate): Secondary | ICD-10-CM | POA: Diagnosis not present

## 2016-03-31 DIAGNOSIS — I129 Hypertensive chronic kidney disease with stage 1 through stage 4 chronic kidney disease, or unspecified chronic kidney disease: Secondary | ICD-10-CM | POA: Diagnosis not present

## 2016-03-31 DIAGNOSIS — Z6841 Body Mass Index (BMI) 40.0 and over, adult: Secondary | ICD-10-CM | POA: Diagnosis not present

## 2016-03-31 DIAGNOSIS — D631 Anemia in chronic kidney disease: Secondary | ICD-10-CM | POA: Diagnosis not present

## 2016-03-31 DIAGNOSIS — N3281 Overactive bladder: Secondary | ICD-10-CM | POA: Diagnosis not present

## 2016-03-31 DIAGNOSIS — Z23 Encounter for immunization: Secondary | ICD-10-CM | POA: Diagnosis not present

## 2016-05-20 DIAGNOSIS — N183 Chronic kidney disease, stage 3 (moderate): Secondary | ICD-10-CM | POA: Diagnosis not present

## 2016-05-20 DIAGNOSIS — I129 Hypertensive chronic kidney disease with stage 1 through stage 4 chronic kidney disease, or unspecified chronic kidney disease: Secondary | ICD-10-CM | POA: Diagnosis not present

## 2016-05-20 DIAGNOSIS — E7800 Pure hypercholesterolemia, unspecified: Secondary | ICD-10-CM | POA: Diagnosis not present

## 2016-05-20 DIAGNOSIS — E1122 Type 2 diabetes mellitus with diabetic chronic kidney disease: Secondary | ICD-10-CM | POA: Diagnosis not present

## 2016-05-20 DIAGNOSIS — G4700 Insomnia, unspecified: Secondary | ICD-10-CM | POA: Diagnosis not present

## 2016-06-20 DIAGNOSIS — M1990 Unspecified osteoarthritis, unspecified site: Secondary | ICD-10-CM | POA: Diagnosis not present

## 2016-06-20 DIAGNOSIS — M5441 Lumbago with sciatica, right side: Secondary | ICD-10-CM | POA: Diagnosis not present

## 2016-08-15 DIAGNOSIS — G4700 Insomnia, unspecified: Secondary | ICD-10-CM | POA: Diagnosis not present

## 2016-08-15 DIAGNOSIS — R05 Cough: Secondary | ICD-10-CM | POA: Diagnosis not present

## 2016-08-15 DIAGNOSIS — M25551 Pain in right hip: Secondary | ICD-10-CM | POA: Diagnosis not present

## 2016-08-15 DIAGNOSIS — G8929 Other chronic pain: Secondary | ICD-10-CM | POA: Diagnosis not present

## 2016-08-15 DIAGNOSIS — M545 Low back pain: Secondary | ICD-10-CM | POA: Diagnosis not present

## 2016-08-18 ENCOUNTER — Other Ambulatory Visit: Payer: Self-pay | Admitting: Family Medicine

## 2016-08-18 ENCOUNTER — Ambulatory Visit
Admission: RE | Admit: 2016-08-18 | Discharge: 2016-08-18 | Disposition: A | Discharge status: Home / Self Care | Payer: Commercial Managed Care - HMO | Source: Ambulatory Visit | Attending: Family Medicine | Admitting: Family Medicine

## 2016-08-18 DIAGNOSIS — M25551 Pain in right hip: Secondary | ICD-10-CM

## 2016-08-18 DIAGNOSIS — M545 Low back pain: Secondary | ICD-10-CM

## 2016-08-26 DIAGNOSIS — M47816 Spondylosis without myelopathy or radiculopathy, lumbar region: Secondary | ICD-10-CM | POA: Diagnosis not present

## 2016-09-06 DIAGNOSIS — M47816 Spondylosis without myelopathy or radiculopathy, lumbar region: Secondary | ICD-10-CM | POA: Diagnosis not present

## 2016-10-06 DIAGNOSIS — Z961 Presence of intraocular lens: Secondary | ICD-10-CM | POA: Diagnosis not present

## 2016-10-06 DIAGNOSIS — H26492 Other secondary cataract, left eye: Secondary | ICD-10-CM | POA: Diagnosis not present

## 2016-10-06 DIAGNOSIS — H524 Presbyopia: Secondary | ICD-10-CM | POA: Diagnosis not present

## 2016-11-10 DIAGNOSIS — M47816 Spondylosis without myelopathy or radiculopathy, lumbar region: Secondary | ICD-10-CM | POA: Diagnosis not present

## 2016-11-23 DIAGNOSIS — N183 Chronic kidney disease, stage 3 (moderate): Secondary | ICD-10-CM | POA: Diagnosis not present

## 2016-11-23 DIAGNOSIS — M8580 Other specified disorders of bone density and structure, unspecified site: Secondary | ICD-10-CM | POA: Diagnosis not present

## 2016-11-23 DIAGNOSIS — G4700 Insomnia, unspecified: Secondary | ICD-10-CM | POA: Diagnosis not present

## 2016-11-23 DIAGNOSIS — Z0000 Encounter for general adult medical examination without abnormal findings: Secondary | ICD-10-CM | POA: Diagnosis not present

## 2016-11-23 DIAGNOSIS — I10 Essential (primary) hypertension: Secondary | ICD-10-CM | POA: Diagnosis not present

## 2016-11-23 DIAGNOSIS — E1122 Type 2 diabetes mellitus with diabetic chronic kidney disease: Secondary | ICD-10-CM | POA: Diagnosis not present

## 2016-11-23 DIAGNOSIS — E7800 Pure hypercholesterolemia, unspecified: Secondary | ICD-10-CM | POA: Diagnosis not present

## 2016-11-23 DIAGNOSIS — M545 Low back pain: Secondary | ICD-10-CM | POA: Diagnosis not present

## 2016-11-23 DIAGNOSIS — Z1159 Encounter for screening for other viral diseases: Secondary | ICD-10-CM | POA: Diagnosis not present

## 2016-12-01 ENCOUNTER — Other Ambulatory Visit: Payer: Self-pay | Admitting: Family Medicine

## 2016-12-01 DIAGNOSIS — Z1231 Encounter for screening mammogram for malignant neoplasm of breast: Secondary | ICD-10-CM

## 2016-12-07 DIAGNOSIS — M47816 Spondylosis without myelopathy or radiculopathy, lumbar region: Secondary | ICD-10-CM | POA: Diagnosis not present

## 2016-12-13 ENCOUNTER — Other Ambulatory Visit: Payer: Self-pay | Admitting: Physical Medicine and Rehabilitation

## 2016-12-13 DIAGNOSIS — M545 Low back pain: Secondary | ICD-10-CM

## 2016-12-23 ENCOUNTER — Ambulatory Visit
Admission: RE | Admit: 2016-12-23 | Discharge: 2016-12-23 | Disposition: A | Discharge status: Home / Self Care | Payer: Commercial Managed Care - HMO | Source: Ambulatory Visit | Attending: Family Medicine | Admitting: Family Medicine

## 2016-12-23 DIAGNOSIS — Z1231 Encounter for screening mammogram for malignant neoplasm of breast: Secondary | ICD-10-CM

## 2016-12-27 ENCOUNTER — Ambulatory Visit
Admission: RE | Admit: 2016-12-27 | Discharge: 2016-12-27 | Disposition: A | Discharge status: Home / Self Care | Payer: Commercial Managed Care - HMO | Source: Ambulatory Visit | Attending: Physical Medicine and Rehabilitation | Admitting: Physical Medicine and Rehabilitation

## 2016-12-27 DIAGNOSIS — M545 Low back pain: Secondary | ICD-10-CM

## 2016-12-27 DIAGNOSIS — M5126 Other intervertebral disc displacement, lumbar region: Secondary | ICD-10-CM | POA: Diagnosis not present

## 2017-01-05 DIAGNOSIS — G4700 Insomnia, unspecified: Secondary | ICD-10-CM | POA: Diagnosis not present

## 2017-01-13 DIAGNOSIS — M47816 Spondylosis without myelopathy or radiculopathy, lumbar region: Secondary | ICD-10-CM | POA: Diagnosis not present

## 2017-01-26 DIAGNOSIS — M47816 Spondylosis without myelopathy or radiculopathy, lumbar region: Secondary | ICD-10-CM | POA: Diagnosis not present

## 2017-02-09 DIAGNOSIS — M47816 Spondylosis without myelopathy or radiculopathy, lumbar region: Secondary | ICD-10-CM | POA: Diagnosis not present

## 2017-02-24 DIAGNOSIS — M47816 Spondylosis without myelopathy or radiculopathy, lumbar region: Secondary | ICD-10-CM | POA: Diagnosis not present

## 2017-03-09 DIAGNOSIS — M47816 Spondylosis without myelopathy or radiculopathy, lumbar region: Secondary | ICD-10-CM | POA: Diagnosis not present

## 2017-03-30 DIAGNOSIS — Z23 Encounter for immunization: Secondary | ICD-10-CM | POA: Diagnosis not present

## 2017-04-04 DIAGNOSIS — M47816 Spondylosis without myelopathy or radiculopathy, lumbar region: Secondary | ICD-10-CM | POA: Diagnosis not present

## 2017-04-19 DIAGNOSIS — N183 Chronic kidney disease, stage 3 (moderate): Secondary | ICD-10-CM | POA: Diagnosis not present

## 2017-04-24 DIAGNOSIS — I129 Hypertensive chronic kidney disease with stage 1 through stage 4 chronic kidney disease, or unspecified chronic kidney disease: Secondary | ICD-10-CM | POA: Diagnosis not present

## 2017-04-24 DIAGNOSIS — N183 Chronic kidney disease, stage 3 (moderate): Secondary | ICD-10-CM | POA: Diagnosis not present

## 2017-04-24 DIAGNOSIS — N2581 Secondary hyperparathyroidism of renal origin: Secondary | ICD-10-CM | POA: Diagnosis not present

## 2017-04-24 DIAGNOSIS — D631 Anemia in chronic kidney disease: Secondary | ICD-10-CM | POA: Diagnosis not present

## 2017-04-29 ENCOUNTER — Encounter (HOSPITAL_BASED_OUTPATIENT_CLINIC_OR_DEPARTMENT_OTHER): Payer: Self-pay

## 2017-04-29 ENCOUNTER — Emergency Department (HOSPITAL_BASED_OUTPATIENT_CLINIC_OR_DEPARTMENT_OTHER): Payer: Medicare HMO

## 2017-04-29 ENCOUNTER — Emergency Department (HOSPITAL_BASED_OUTPATIENT_CLINIC_OR_DEPARTMENT_OTHER)
Admission: EM | Admit: 2017-04-29 | Discharge: 2017-04-29 | Disposition: A | Discharge status: Home / Self Care | Payer: Medicare HMO | Attending: Emergency Medicine | Admitting: Emergency Medicine

## 2017-04-29 DIAGNOSIS — Z87891 Personal history of nicotine dependence: Secondary | ICD-10-CM | POA: Insufficient documentation

## 2017-04-29 DIAGNOSIS — R1013 Epigastric pain: Secondary | ICD-10-CM | POA: Diagnosis not present

## 2017-04-29 DIAGNOSIS — E1122 Type 2 diabetes mellitus with diabetic chronic kidney disease: Secondary | ICD-10-CM | POA: Diagnosis not present

## 2017-04-29 DIAGNOSIS — I129 Hypertensive chronic kidney disease with stage 1 through stage 4 chronic kidney disease, or unspecified chronic kidney disease: Secondary | ICD-10-CM | POA: Diagnosis not present

## 2017-04-29 DIAGNOSIS — Z79899 Other long term (current) drug therapy: Secondary | ICD-10-CM | POA: Diagnosis not present

## 2017-04-29 DIAGNOSIS — N183 Chronic kidney disease, stage 3 (moderate): Secondary | ICD-10-CM | POA: Insufficient documentation

## 2017-04-29 DIAGNOSIS — Z7984 Long term (current) use of oral hypoglycemic drugs: Secondary | ICD-10-CM | POA: Insufficient documentation

## 2017-04-29 DIAGNOSIS — R109 Unspecified abdominal pain: Secondary | ICD-10-CM | POA: Diagnosis not present

## 2017-04-29 HISTORY — DX: Disorder of kidney and ureter, unspecified: N28.9

## 2017-04-29 LAB — COMPREHENSIVE METABOLIC PANEL
ALBUMIN: 3.6 g/dL (ref 3.5–5.0)
ALK PHOS: 160 U/L — AB (ref 38–126)
ALT: 472 U/L — ABNORMAL HIGH (ref 14–54)
ANION GAP: 8 (ref 5–15)
AST: 663 U/L — AB (ref 15–41)
BILIRUBIN TOTAL: 1.8 mg/dL — AB (ref 0.3–1.2)
BUN: 59 mg/dL — AB (ref 6–20)
CO2: 25 mmol/L (ref 22–32)
Calcium: 9 mg/dL (ref 8.9–10.3)
Chloride: 104 mmol/L (ref 101–111)
Creatinine, Ser: 1.59 mg/dL — ABNORMAL HIGH (ref 0.44–1.00)
GFR calc Af Amer: 34 mL/min — ABNORMAL LOW (ref >60)
GFR calc non Af Amer: 30 mL/min — ABNORMAL LOW (ref >60)
GLUCOSE: 179 mg/dL — AB (ref 65–99)
POTASSIUM: 3.9 mmol/L (ref 3.5–5.1)
Sodium: 137 mmol/L (ref 135–145)
TOTAL PROTEIN: 7.5 g/dL (ref 6.5–8.1)

## 2017-04-29 LAB — CBC WITH DIFFERENTIAL/PLATELET
BASOS ABS: 0 10*3/uL (ref 0.0–0.1)
BASOS PCT: 0 %
EOS ABS: 0 10*3/uL (ref 0.0–0.7)
Eosinophils Relative: 0 %
HEMATOCRIT: 39.7 % (ref 36.0–46.0)
HEMOGLOBIN: 13.2 g/dL (ref 12.0–15.0)
Lymphocytes Relative: 4 %
Lymphs Abs: 0.5 10*3/uL — ABNORMAL LOW (ref 0.7–4.0)
MCH: 29.7 pg (ref 26.0–34.0)
MCHC: 33.2 g/dL (ref 30.0–36.0)
MCV: 89.4 fL (ref 78.0–100.0)
Monocytes Absolute: 0.2 10*3/uL (ref 0.1–1.0)
Monocytes Relative: 2 %
NEUTROS ABS: 12.1 10*3/uL — AB (ref 1.7–7.7)
NEUTROS PCT: 94 %
Platelets: 204 10*3/uL (ref 150–400)
RBC: 4.44 MIL/uL (ref 3.87–5.11)
RDW: 14.9 % (ref 11.5–15.5)
WBC: 12.8 10*3/uL — AB (ref 4.0–10.5)

## 2017-04-29 LAB — I-STAT CHEM 8, ED
BUN: 52 mg/dL — ABNORMAL HIGH (ref 6–20)
CALCIUM ION: 1.13 mmol/L — AB (ref 1.15–1.40)
CHLORIDE: 103 mmol/L (ref 101–111)
Creatinine, Ser: 1.5 mg/dL — ABNORMAL HIGH (ref 0.44–1.00)
GLUCOSE: 178 mg/dL — AB (ref 65–99)
HCT: 42 % (ref 36.0–46.0)
HEMOGLOBIN: 14.3 g/dL (ref 12.0–15.0)
POTASSIUM: 3.9 mmol/L (ref 3.5–5.1)
Sodium: 141 mmol/L (ref 135–145)
TCO2: 25 mmol/L (ref 22–32)

## 2017-04-29 LAB — TROPONIN I: Troponin I: <0.03 ng/mL (ref <0.03)

## 2017-04-29 LAB — URINALYSIS, MICROSCOPIC (REFLEX)

## 2017-04-29 LAB — URINALYSIS, ROUTINE W REFLEX MICROSCOPIC
Bilirubin Urine: NEGATIVE
GLUCOSE, UA: NEGATIVE mg/dL
KETONES UR: NEGATIVE mg/dL
Leukocytes, UA: NEGATIVE
NITRITE: NEGATIVE
PROTEIN: NEGATIVE mg/dL
pH: 7 (ref 5.0–8.0)

## 2017-04-29 LAB — I-STAT CG4 LACTIC ACID, ED: Lactic Acid, Venous: 1.6 mmol/L (ref 0.5–1.9)

## 2017-04-29 LAB — LIPASE, BLOOD: LIPASE: 33 U/L (ref 11–51)

## 2017-04-29 MED ORDER — PANTOPRAZOLE SODIUM 40 MG IV SOLR
40.0000 mg | Freq: Once | INTRAVENOUS | Status: AC
  Administered 2017-04-29: 40 mg via INTRAVENOUS
  Filled 2017-04-29: qty 40

## 2017-04-29 MED ORDER — HYDROCODONE-ACETAMINOPHEN 5-325 MG PO TABS: 1.0000 | ORAL_TABLET | ORAL | 0 refills | Status: DC | PRN

## 2017-04-29 MED ORDER — ONDANSETRON 4 MG PO TBDP: 4.0000 mg | ORAL_TABLET | Freq: Three times a day (TID) | ORAL | 0 refills | Status: DC | PRN

## 2017-04-29 MED ORDER — IOPAMIDOL (ISOVUE-370) INJECTION 76%
100.0000 mL | Freq: Once | INTRAVENOUS | Status: AC | PRN
  Administered 2017-04-29: 80 mL via INTRAVENOUS

## 2017-04-29 MED ORDER — FENTANYL CITRATE (PF) 100 MCG/2ML IJ SOLN
25.0000 ug | Freq: Once | INTRAMUSCULAR | Status: AC
  Administered 2017-04-29: 25 ug via INTRAVENOUS
  Filled 2017-04-29: qty 2

## 2017-04-29 MED ORDER — SODIUM CHLORIDE 0.9 % IV BOLUS (SEPSIS): 1000.0000 mL | Freq: Once | INTRAVENOUS | Status: DC

## 2017-04-29 MED ORDER — PANTOPRAZOLE SODIUM 20 MG PO TBEC: 20.0000 mg | DELAYED_RELEASE_TABLET | Freq: Every day | ORAL | 0 refills | Status: DC

## 2017-04-29 MED ORDER — SODIUM CHLORIDE 0.9 % IV BOLUS (SEPSIS)
1000.0000 mL | Freq: Once | INTRAVENOUS | Status: AC
  Administered 2017-04-29: 1000 mL via INTRAVENOUS

## 2017-04-29 NOTE — ED Triage Notes (Signed)
Pt reports abd. pain in umbulicus region that radiates straight through to her back, onset last PM. Pt took pepto with no relief. Pt reports nausea, denies vomiting. Pt looks uncomfortable on assessment.

## 2017-04-29 NOTE — ED Notes (Signed)
ED Provider at bedside. 

## 2017-04-29 NOTE — ED Notes (Signed)
Attempted IV stick to right AC without success

## 2017-04-29 NOTE — ED Notes (Signed)
Was in room getting patient to the restroom - patient urinated on herself. Modesty protect, and patient cleaned up returning her to bed

## 2017-04-29 NOTE — ED Provider Notes (Signed)
Pt signed out by Dr. Thomasene Lot pending lab results and CT scan results.  Labs show some mild renal insufficiency, so pt given 1L NS and reduced dose IV contrast.  Thankfully CT scan was ok.  Pt is feeling much better and is ready to go home.  She will be d/c with lortab (#10), zofran, and protonix.  She is given the number of GI to f/u.  She knows to return if worse.   Isla Pence, MD 04/29/17 [phone]

## 2017-04-29 NOTE — ED Provider Notes (Signed)
[facility]PART[facility] Provider Note   CSN: [id number] Arrival date & time: 04/29/17  0601     History   Chief Complaint Chief Complaint  Patient presents with  . Abdominal Pain    HPI Pamela [name] is a 82 y.o. female.  HPI   Patient is an 83 year old female presenting with abdominal pain radiating to her back.  It started at 10 PM last night.  Patient has history of diverticulosis, normal colonoscopy 2 years ago.  Patient has history of GERD.  She reports this is much more painful.  She has tried [employer] x3 and the pain has gotten worse.  Patient has chronic back pain, multiple steroid injections.  Surgical history significant for appendectomy, cholecystectomy, and hysterectomy.  No vomiting.  Just epigastric pain rating to the back mild nausea no diarrhea.  Past Medical History:  Diagnosis Date  . Bilateral lower extremity edema    left lower extremity cellulitis  . Diabetes ([place])   . Diverticulosis   . Hypertension   . Kidney disease, chronic, stage III (GFR 30-59 ml/min) (HCC)   . Obesity   . Osteopenia   . Seizures ([place]) 22 yrs ago   no cause found  . Tubular adenoma of colon 2009    Patient Active Problem List   Diagnosis Date Noted  . Bloating 11/04/2014  . Hx of adenomatous colonic polyps   . Bilateral lower extremity edema 09/10/2013  . Essential hypertension 09/10/2013  . Diabetes ([place]) 09/10/2013    Past Surgical History:  Procedure Laterality Date  . ABDOMINAL HYSTERECTOMY    . APPENDECTOMY    . CHOLECYSTECTOMY    . COLONOSCOPY WITH PROPOFOL N/A 10/16/2014   Procedure: COLONOSCOPY WITH PROPOFOL;  Surgeon: [name], MD;  Location: [facility];  Service: Endoscopy;  Laterality: N/A;  . TONSILLECTOMY      OB History    No data available       Home Medications    Prior to Admission medications   Medication Sig Start Date End Date Taking? Authorizing Provider  lovastatin (MEVACOR) 20 MG tablet Take 20 mg by  mouth at bedtime.   Yes [provider]  metroNIDAZOLE (FLAGYL) 500 MG tablet Take 1 tablet (500 mg total) by mouth 3 (three) times daily. For 10 days 11/04/14  Yes Willia Craze, NP  omeprazole (PRILOSEC) 10 MG capsule Take 10 mg by mouth daily.   Yes [provider]  potassium chloride (K-DUR) 10 MEQ tablet Take 1 tablet (10 mEq total) by mouth daily. 09/10/13  Yes Lorretta Harp, MD  Calcium Carb-Cholecalciferol (CALCIUM + D3 PO) Take 1,000 mg by mouth 2 (two) times daily.     [provider]  losartan-hydrochlorothiazide (HYZAAR) 50-12.5 MG per tablet Take 1 tablet by mouth every morning.     [provider]  metFORMIN (GLUCOPHAGE) 500 MG tablet Take 500 mg by mouth 2 (two) times daily.    [provider]  Omega-3 Fatty Acids (FISH OIL PO) Take 2,000 mg by mouth every morning.     [provider]    Family History No family history on file.  Social History Social History  Substance Use Topics  . Smoking status: Former Smoker    Packs/day: 1.00    Years: 20.00    Types: Cigarettes  . Smokeless tobacco: Never Used     Comment: quit smoking 25 yrs ago  . Alcohol use No     Allergies   Levaquin [levofloxacin in  d5w]   Review of Systems Review of Systems  Constitutional: Negative for activity change, fatigue and fever.  Respiratory: Negative for shortness of breath.   Cardiovascular: Negative for chest pain.  Gastrointestinal: Positive for abdominal pain and nausea.     Physical Exam Updated Vital Signs BP (!) 165/82 (BP Location: Right Arm)   Pulse 96   Temp (!) 97.5 F (36.4 C) (Oral)   Resp (!) 22   Ht 5\' 2"  (1.575 m)   Wt 128.4 kg (283 lb)   BMI 51.76 kg/m   Physical Exam  Constitutional: She is oriented to person, place, and time. She appears well-developed and well-nourished.  Patient in obvious discomfort, moaning.  HENT:  Head: Normocephalic and atraumatic.  Eyes: Right eye exhibits no discharge.  Left eye exhibits no discharge.  Cardiovascular: Normal rate and regular rhythm.   Pulmonary/Chest: Effort normal.  Abdominal: Soft. There is no tenderness.  Obese abdomen.  Scars present.  She has no real tenderness on exam.  Neurological: She is oriented to person, place, and time.  Skin: Skin is warm and dry. She is not diaphoretic.  Psychiatric: She has a normal mood and affect.  Nursing note and vitals reviewed.    ED Treatments / Results  Labs (all labs ordered are listed, but only abnormal results are displayed) Labs Reviewed  CBC WITH DIFFERENTIAL/PLATELET  COMPREHENSIVE METABOLIC PANEL  TROPONIN I  URINALYSIS, ROUTINE W REFLEX MICROSCOPIC  LIPASE, BLOOD  I-STAT CG4 LACTIC ACID, ED  I-STAT CHEM 8, ED    EKG  EKG Interpretation  Date/Time:  Saturday April 29 2017 06:44:13 EDT Ventricular Rate:  103 PR Interval:  176 QRS Duration: 72 QT Interval:  352 QTC Calculation: 461 R Axis:   -48 Text Interpretation:  Sinus tachycardia with occasional Premature ventricular complexes no evidence of acute ichemia Confirmed by Thomasene Lot, [place] ([id number]) on 04/29/2017 6:44:30 AM       Radiology No results found.  Procedures Procedures (including critical care time)  Medications Ordered in ED Medications - No data to display   Initial Impression / Assessment and Plan / ED Course  I have reviewed the triage vital signs and the nursing notes.  Pertinent labs & imaging results that were available during my care of the patient were reviewed by me and considered in my medical decision making (see chart for details).    Patient is an 82 year old female presenting with abdominal pain radiating to her back.  It started at 10 PM last night.  Patient has history of diverticulosis, normal colonoscopy 2 years ago.  Patient has history of GERD.  She reports this is much more painful.  She has tried [employer] x3 and the pain has gotten worse.  Patient has chronic back pain, multiple  steroid injections.  Surgical history significant for appendectomy, cholecystectomy, and hysterectomy.  No vomiting.  Just epigastric pain rating to the back mild nausea no diarrhea.  6:44 AM  Blood pressures in bilateral arms were roughly comparable.  Good pulses bilaterally.  Given her amount of discomfort ordered CT dissection study after patient was seen. Signed out to oncoming provider  Final Clinical Impressions(s) / ED Diagnoses   Final diagnoses:  None    New Prescriptions New Prescriptions   No medications on file     Macarthur Critchley, MD 05/02/17 2351

## 2017-04-29 NOTE — ED Notes (Signed)
Patient transported to CT 

## 2017-04-30 LAB — URINE CULTURE: Culture: >=100000 — AB

## 2017-05-01 ENCOUNTER — Telehealth: Payer: Self-pay | Admitting: Emergency Medicine

## 2017-05-01 DIAGNOSIS — I129 Hypertensive chronic kidney disease with stage 1 through stage 4 chronic kidney disease, or unspecified chronic kidney disease: Secondary | ICD-10-CM | POA: Diagnosis not present

## 2017-05-01 DIAGNOSIS — R1013 Epigastric pain: Secondary | ICD-10-CM | POA: Diagnosis not present

## 2017-05-01 DIAGNOSIS — I700 Atherosclerosis of aorta: Secondary | ICD-10-CM | POA: Diagnosis not present

## 2017-05-01 DIAGNOSIS — R51 Headache: Secondary | ICD-10-CM | POA: Diagnosis not present

## 2017-05-01 DIAGNOSIS — E1122 Type 2 diabetes mellitus with diabetic chronic kidney disease: Secondary | ICD-10-CM | POA: Diagnosis not present

## 2017-05-01 DIAGNOSIS — Z7984 Long term (current) use of oral hypoglycemic drugs: Secondary | ICD-10-CM | POA: Diagnosis not present

## 2017-05-01 DIAGNOSIS — R7989 Other specified abnormal findings of blood chemistry: Secondary | ICD-10-CM | POA: Diagnosis not present

## 2017-05-01 NOTE — Progress Notes (Signed)
ED Antimicrobial Stewardship Positive Culture Follow Up   Pamela [name] is an 82 y.o. female who presented to [facility] on 04/29/2017 with a chief complaint of  Chief Complaint  Patient presents with  . Abdominal Pain    Recent Results (from the past 720 hour(s))  Urine culture     Status: Abnormal   Collection Time: 04/29/17  8:36 AM  Result Value Ref Range Status   Specimen Description URINE, RANDOM  Final   Special Requests NONE  Final   Culture >=100,000 COLONIES/mL VIRIDANS STREPTOCOCCUS (A)  Final   Report Status 04/30/2017 FINAL  Final   No abx indicated  ED Provider: Gifford Shave 05/01/2017, 8:49 AM Infectious Diseases Pharmacist Phone# [phone]

## 2017-05-01 NOTE — Telephone Encounter (Signed)
Post ED Visit - Positive Culture Follow-up  Culture report reviewed by antimicrobial stewardship pharmacist:  []  Elenor Quinones, Pharm.D. []  Heide Guile, Pharm.D., BCPS AQ-ID [x]  Parks Neptune, Pharm.D., BCPS []  Alycia Rossetti, Pharm.D., BCPS []  [place], Pharm.D., BCPS, AAHIVP []  Legrand Como, Pharm.D., BCPS, AAHIVP []  Salome Arnt, PharmD, BCPS []  Dimitri Ped, PharmD, BCPS []  Vincenza Hews, PharmD, BCPS  Positive urine culture Treated with none, followup with GI,no further patient follow-up is required at this time.  Hazle Nordmann 05/01/2017, 12:04 PM

## 2017-05-16 ENCOUNTER — Other Ambulatory Visit (INDEPENDENT_AMBULATORY_CARE_PROVIDER_SITE_OTHER): Payer: Medicare HMO

## 2017-05-16 ENCOUNTER — Encounter: Payer: Self-pay | Admitting: Nurse Practitioner

## 2017-05-16 ENCOUNTER — Ambulatory Visit: Payer: Medicare HMO | Admitting: Nurse Practitioner

## 2017-05-16 VITALS — BP 134/74 | HR 87 | Ht 62.0 in | Wt 275.0 lb

## 2017-05-16 DIAGNOSIS — R1010 Upper abdominal pain, unspecified: Secondary | ICD-10-CM | POA: Diagnosis not present

## 2017-05-16 DIAGNOSIS — R945 Abnormal results of liver function studies: Secondary | ICD-10-CM

## 2017-05-16 DIAGNOSIS — R7989 Other specified abnormal findings of blood chemistry: Secondary | ICD-10-CM

## 2017-05-16 LAB — CBC WITH DIFFERENTIAL/PLATELET
BASOS ABS: 0.1 10*3/uL (ref 0.0–0.1)
Basophils Relative: 0.9 % (ref 0.0–3.0)
Eosinophils Absolute: 0.2 10*3/uL (ref 0.0–0.7)
Eosinophils Relative: 2.8 % (ref 0.0–5.0)
HCT: 42.3 % (ref 36.0–46.0)
Hemoglobin: 13.9 g/dL (ref 12.0–15.0)
LYMPHS ABS: 1.4 10*3/uL (ref 0.7–4.0)
Lymphocytes Relative: 16.5 % (ref 12.0–46.0)
MCHC: 32.9 g/dL (ref 30.0–36.0)
MCV: 92 fl (ref 78.0–100.0)
MONO ABS: 0.7 10*3/uL (ref 0.1–1.0)
Monocytes Relative: 7.8 % (ref 3.0–12.0)
NEUTROS ABS: 6.1 10*3/uL (ref 1.4–7.7)
Neutrophils Relative %: 72 % (ref 43.0–77.0)
PLATELETS: 313 10*3/uL (ref 150.0–400.0)
RBC: 4.6 Mil/uL (ref 3.87–5.11)
RDW: 15.8 % — ABNORMAL HIGH (ref 11.5–15.5)
WBC: 8.4 10*3/uL (ref 4.0–10.5)

## 2017-05-16 LAB — COMPREHENSIVE METABOLIC PANEL
ALT: 75 U/L — AB (ref 0–35)
AST: 34 U/L (ref 0–37)
Albumin: 3.9 g/dL (ref 3.5–5.2)
Alkaline Phosphatase: 208 U/L — ABNORMAL HIGH (ref 39–117)
BILIRUBIN TOTAL: 1 mg/dL (ref 0.2–1.2)
BUN: 45 mg/dL — AB (ref 6–23)
CO2: 28 meq/L (ref 19–32)
Calcium: 9.9 mg/dL (ref 8.4–10.5)
Chloride: 105 mEq/L (ref 96–112)
Creatinine, Ser: 1.47 mg/dL — ABNORMAL HIGH (ref 0.40–1.20)
GFR: 36.28 mL/min — AB (ref >60.00)
GLUCOSE: 155 mg/dL — AB (ref 70–99)
POTASSIUM: 4.7 meq/L (ref 3.5–5.1)
SODIUM: 141 meq/L (ref 135–145)
TOTAL PROTEIN: 7.8 g/dL (ref 6.0–8.3)

## 2017-05-16 NOTE — Progress Notes (Signed)
Chief Complaint:  Abdominal pain and abnormal liver labs   HPI: Patient is an 82 year old female known to Dr. Fuller Plan for history of adenomatous colon polyps.  She has aged out of polyp surveillance . Patient was seen in the ED 04/29/2017 for acute upper abdominal pain radiating through to her back.  Pain was constant.  She had never had this type of pain before.  She recalls having some associated chills and a few episodes of nausea in the days preceding the pain.  In ED her white count was 12.8, hemoglobin normal.  Lipase normal.  CT angio chest /abdomen / pelvis was negative for acute abnormalities.  There was mild intra-and extrahepatic biliary duct dilation as what might be expected postcholecystectomy. Her LFTs were markedly abnormal with AST of 633 and ALT of 472.  Bili was 1.8, alk phos 160. Patient did not know about this until she had an ED follow-up with her PCP.Discharged from [employer], Zofran and hydrocodone.    Repeat labs were done by PCP at ED follow up.  Her AST was down to112, ALT down to 217.  Her total bili was up from 1.8 to 2.4.  Alkaline phosphatase not reported.  Patient has not had any further abdominal pain  Patient has several general medical complaints, especially significant daytime sleepiness.  She is not sleeping well but did choose to stop her Elavil for fear of dependence.   She is waking up several times during the night, many times to urinate on diuretics.  She is not necessarily fatigued and is not having any shortness of breath.  She has recently developed a productive cough.  Her appetite is poor.  Past Medical History:  Diagnosis Date  . Bilateral lower extremity edema    left lower extremity cellulitis  . Diabetes ([place])   . Diverticulosis   . Hypertension   . Kidney disease, chronic, stage III (GFR 30-59 ml/min) (HCC)   . Obesity   . Osteopenia   . Renal insufficiency   . Seizures ([place]) 22 yrs ago   no cause found  . Tubular adenoma of  colon 2009    Patient's surgical history, family medical history, social history, medications and allergies were all reviewed in Epic    Physical Exam: BP 134/74   Pulse 87   Ht _0  (1.575 m)   Wt 275 lb (124.7 kg)   BMI 50.30 kg/m   GENERAL:  Obese white female in NAD PSYCH: :Pleasant, cooperative, normal affect EENT:  conjunctiva pink, mucous membranes moist, neck supple without masses CARDIAC:  RRR, no murmur heard, no peripheral edema PULM: Normal respiratory effort, lungs CTA bilaterally, no wheezing ABDOMEN:  Nondistended, soft, nontender. No obvious masses, no hepatomegaly,  normal bowel sounds SKIN:  turgor, no lesions seen Musculoskeletal:  Normal muscle tone, normal strength NEURO: Alert and oriented x 3, no focal neurologic deficits  ASSESSMENT and PLAN:  Pleasant 82 year old female with recent episode of severe upper abdominal pain radiating through to back. Pain has resolved. Abnormal liver chemistries in ED, predominantly transaminitis with AST of 663, ALT of 472.  Mild intra-and extra hepatic biliary duct dilation on CT scan. Normal lipase. Repeat labs at PCP's office -transaminases trending down but further increase (mild) in bilirubin.  Remote cholecystectomy -History and labs sound like she experience temporary biliary obstruction. Passed gallstone possible though she is s/p very remote cholecystectomy at age 64.  -cbc, LFTs today. Further recommendations pending above.    Nevin Bloodgood  Chester [name] , NP 05/16/2017, 9:10 AM

## 2017-05-16 NOTE — H&P (View-Only) (Signed)
Chief Complaint:  Abdominal pain and abnormal liver labs   HPI: Patient is an 82 year old female known to Dr. Fuller Plan for history of adenomatous colon polyps.  She has aged out of polyp surveillance . Patient was seen in the ED 04/29/2017 for acute upper abdominal pain radiating through to her back.  Pain was constant.  She had never had this type of pain before.  She recalls having some associated chills and a few episodes of nausea in the days preceding the pain.  In ED her white count was 12.8, hemoglobin normal.  Lipase normal.  CT angio chest /abdomen / pelvis was negative for acute abnormalities.  There was mild intra-and extrahepatic biliary duct dilation as what might be expected postcholecystectomy. Her LFTs were markedly abnormal with AST of 633 and ALT of 472.  Bili was 1.8, alk phos 160. Patient did not know about this until she had an ED follow-up with her PCP.Discharged from [employer], Zofran and hydrocodone.    Repeat labs were done by PCP at ED follow up.  Her AST was down to112, ALT down to 217.  Her total bili was up from 1.8 to 2.4.  Alkaline phosphatase not reported.  Patient has not had any further abdominal pain  Patient has several general medical complaints, especially significant daytime sleepiness.  She is not sleeping well but did choose to stop her Elavil for fear of dependence.   She is waking up several times during the night, many times to urinate on diuretics.  She is not necessarily fatigued and is not having any shortness of breath.  She has recently developed a productive cough.  Her appetite is poor.  Past Medical History:  Diagnosis Date  . Bilateral lower extremity edema    left lower extremity cellulitis  . Diabetes ([place])   . Diverticulosis   . Hypertension   . Kidney disease, chronic, stage III (GFR 30-59 ml/min) (HCC)   . Obesity   . Osteopenia   . Renal insufficiency   . Seizures ([place]) 22 yrs ago   no cause found  . Tubular adenoma of  colon 2009    Patient's surgical history, family medical history, social history, medications and allergies were all reviewed in Epic    Physical Exam: BP 134/74   Pulse 87   Ht _0  (1.575 m)   Wt 275 lb (124.7 kg)   BMI 50.30 kg/m   GENERAL:  Obese white female in NAD PSYCH: :Pleasant, cooperative, normal affect EENT:  conjunctiva pink, mucous membranes moist, neck supple without masses CARDIAC:  RRR, no murmur heard, no peripheral edema PULM: Normal respiratory effort, lungs CTA bilaterally, no wheezing ABDOMEN:  Nondistended, soft, nontender. No obvious masses, no hepatomegaly,  normal bowel sounds SKIN:  turgor, no lesions seen Musculoskeletal:  Normal muscle tone, normal strength NEURO: Alert and oriented x 3, no focal neurologic deficits  ASSESSMENT and PLAN:  Pleasant 82 year old female with recent episode of severe upper abdominal pain radiating through to back. Pain has resolved. Abnormal liver chemistries in ED, predominantly transaminitis with AST of 663, ALT of 472.  Mild intra-and extra hepatic biliary duct dilation on CT scan. Normal lipase. Repeat labs at PCP's office -transaminases trending down but further increase (mild) in bilirubin.  Remote cholecystectomy -History and labs sound like she experience temporary biliary obstruction. Passed gallstone possible though she is s/p very remote cholecystectomy at age 64.  -cbc, LFTs today. Further recommendations pending above.    Nevin Bloodgood  Chester [name] , NP 05/16/2017, 9:10 AM

## 2017-05-16 NOTE — Patient Instructions (Signed)
If you are age 80 or older, your body mass index should be between 23-30. Your Body mass index is 50.3 kg/m. If this is out of the aforementioned range listed, please consider follow up with your Primary Care Provider.  If you are age 73 or younger, your body mass index should be between 19-25. Your Body mass index is 50.3 kg/m. If this is out of the aformentioned range listed, please consider follow up with your Primary Care Provider.   Your physician has requested that you go to the basement for the following lab work before leaving today: CMET CBC w/Diff  We will call you with results.  Thank you for choosing me and [facility].   Tye Savoy, NP

## 2017-05-17 ENCOUNTER — Encounter: Payer: Self-pay | Admitting: Nurse Practitioner

## 2017-05-17 NOTE — Progress Notes (Signed)
Reviewed and agree with management plan. In addition, recommend MRCP to further evaluate.   Pricilla Riffle. Fuller Plan, MD [facility]

## 2017-05-17 NOTE — H&P (View-Only) (Signed)
Reviewed and agree with management plan. In addition, recommend MRCP to further evaluate.   Pricilla Riffle. Fuller Plan, MD [facility]

## 2017-05-18 ENCOUNTER — Telehealth: Payer: Self-pay | Admitting: Nurse Practitioner

## 2017-05-19 NOTE — Telephone Encounter (Signed)
Pamela [name], can you let patient know that her liver enzymes have markedly improved. Alk phos is still up though. She needs an MRCP to evaluate bile ducts to rule out obstructive process. Can you arrange for her to have this please?  Thanks

## 2017-05-19 NOTE — Telephone Encounter (Signed)
Patient husband has questions regarding lab results. Best # [phone]

## 2017-05-19 NOTE — Telephone Encounter (Signed)
Patient can see the results through "My Chart." Waiting for the recommendations.

## 2017-05-22 NOTE — Telephone Encounter (Signed)
I spoke with the patient about her results. She refuses an MRCP. She is "terrified" of that. She declines sedation for anxiety. She wants another option.

## 2017-05-23 ENCOUNTER — Telehealth: Payer: Self-pay | Admitting: Gastroenterology

## 2017-05-23 NOTE — Telephone Encounter (Signed)
Pt is returning your call

## 2017-05-23 NOTE — Telephone Encounter (Signed)
ERCP is only other option to evaluate bile ducts which has the risk of general anesthesia, pancreatitis, etc.  MRCP is simply and MRI so it is much safer as the next step in this evaluation and she should reconsider proceeding with MRCP. I recommend she has an MRCP to further evaluate.

## 2017-05-23 NOTE — Telephone Encounter (Signed)
Message to [facility] and to Dr Fuller Plan.

## 2017-05-23 NOTE — Telephone Encounter (Signed)
The patient's husband is calling now. Are there any other treatment options. She is complaining of abdominal discomfort ongoing.

## 2017-05-23 NOTE — Telephone Encounter (Signed)
Left message on machine to call back  

## 2017-05-24 ENCOUNTER — Other Ambulatory Visit: Payer: Self-pay

## 2017-05-24 ENCOUNTER — Telehealth: Payer: Self-pay

## 2017-05-24 DIAGNOSIS — R945 Abnormal results of liver function studies: Secondary | ICD-10-CM

## 2017-05-24 DIAGNOSIS — R7989 Other specified abnormal findings of blood chemistry: Secondary | ICD-10-CM

## 2017-05-24 MED ORDER — ALPRAZOLAM 0.25 MG PO TABS: ORAL_TABLET | ORAL | 0 refills | Status: DC

## 2017-05-24 NOTE — Telephone Encounter (Signed)
Xanax 0.25 mg po 30-60 min prior to MRCP. No driving or operating any machinery for 8 hours post Xanax.

## 2017-05-24 NOTE — Telephone Encounter (Signed)
Spoke with the husband. The patient will have the MRCP if you can prescribe anti-anxiety for her to take prior to imaging. Her husband will be driving her to and from the appointment. Please advise.

## 2017-05-24 NOTE — Telephone Encounter (Signed)
Agrees to this plan and instruction. Asking for the open bore MRI which is available at [facility]. Insurance is [employer]

## 2017-05-24 NOTE — Telephone Encounter (Signed)
If insurance will pay for the open MRI

## 2017-05-24 NOTE — Telephone Encounter (Signed)
Received fax that order needed to be changed to with and without contrast for patients upcoming MRI abdomen  With MRCP set up to be done 05/29/17 at [facility] on [facility], [place].  Spoke with the scheduling person Kenney Houseman and she told me to fax the order to both of the following #'s: [phone] and [phone].  I spoke with [facility], LPN to make sure this was ok to change since she put in the original order.  I got confirmation that the fax went to both #'s.

## 2017-05-29 DIAGNOSIS — K838 Other specified diseases of biliary tract: Secondary | ICD-10-CM | POA: Diagnosis not present

## 2017-05-29 DIAGNOSIS — K8050 Calculus of bile duct without cholangitis or cholecystitis without obstruction: Secondary | ICD-10-CM | POA: Diagnosis not present

## 2017-05-30 ENCOUNTER — Other Ambulatory Visit: Payer: Self-pay

## 2017-05-30 DIAGNOSIS — K8050 Calculus of bile duct without cholangitis or cholecystitis without obstruction: Secondary | ICD-10-CM

## 2017-05-30 NOTE — Telephone Encounter (Signed)
MRCP shows CBD stone Please schedule ERCP which I could do Wed or Thurs this week at [facility] or [facility]

## 2017-05-30 NOTE — Telephone Encounter (Signed)
The patient has had the MRCP and the results are in Dr Lynne Leader office. The report is in [place].Marland Kitchen

## 2017-05-30 NOTE — Telephone Encounter (Signed)
Left message for patient to call back ERCP arranged for 06/02/17 8:00 am arrival at [facility] (Dr. Fuller Plan aware of change in dates).

## 2017-05-30 NOTE — Telephone Encounter (Signed)
Patient notified of the need for ERCP and risks.  She is notified to arrive at [facility] admitting at 6:00 am on 06/02/17 and be NPO after midnight.  She is also notified to also hold her metformin after midnight

## 2017-06-01 ENCOUNTER — Encounter (HOSPITAL_COMMUNITY): Payer: Self-pay | Admitting: *Deleted

## 2017-06-01 ENCOUNTER — Other Ambulatory Visit: Payer: Self-pay

## 2017-06-01 NOTE — Progress Notes (Signed)
Anesthesia Chart Review:  Pt is a same day work up.   Pt is an 82 year old female scheduled for ERCP on 06/02/2017 with Lucio Edward, MD  - PCP is Orpah Melter, MD  PMH includes:  HTN, DM, CKD (stage III), remote hx seizures. Former smoker.   - ED visit 04/29/17 for abdominal pain radiating to back. AST 663/ALT 472 at that time. Temporary biliary obstruction suspected and is the reason for scheduled ERCP.   Medications include: losartan-hctz, lovastatin, metformin, prilosec, pioglitazone  Labs, if needed, will be obtained day of surgery.   EKG 04/29/17: Sinus tachycardia (103 bpm) with occasional PVCs.    CT angio chest 04/29/17:  1. No evidence of acute aortic syndrome. No dissection or aneurysm. Calcified and noncalcified aortic atherosclerotic vascular disease with prominent noncalcified plaque at the diaphragmatic hiatus. No evidence of penetrating ulcer. 2. No acute intrathoracic or intra-abdominal process.  Echo 09/19/13:  - Left ventricle: The cavity size was normal. Systolicfunction was normal. The estimated ejection fraction wasin the range of 60% to 65%. Doppler parameters areconsistent with abnormal left ventricular relaxation(grade 1 diastolic dysfunction). - Left atrium: The atrium was mildly dilated.  If no changes, I anticipate pt can proceed with surgery as scheduled.   Willeen Cass, FNP-BC [facility]/Anesthesiology Phone: [phone] 06/01/2017 2:56 PM

## 2017-06-01 NOTE — Progress Notes (Signed)
Pt denies SOB, chest pain, and being under the care of a cardiologist. Pt denies having a stress test and cardiac cath. Pt denies having a chest x ray within the last year. Pt made aware to stop taking vitamins, fish oil and herbal medications. Do not take any NSAIDs ie: Ibuprofen, Advil, Naproxen(Aleve), Motrin, BC and Goody Powder. Pt made aware to not take Metformin and Actos DOS. Pt made aware to check BG every 2 hours prior to arrival to hospital on DOS. Pt made aware to treat a BG < 70 with  4 ounces of apple or cranberry juice, wait 15 minutes after drinking juice to recheck BG, if BG remains < 70, call the Endoscopy Unit unit to speak with a nurse. Pt verbalized understanding of all pre-op instructions. Please review anesthesia note.

## 2017-06-01 NOTE — Anesthesia Preprocedure Evaluation (Addendum)
Anesthesia Evaluation  Patient identified by MRN, date of birth, ID band Patient awake    Reviewed: Allergy & Precautions, H&P , NPO status , Patient's Chart, lab work & pertinent test results  Airway Mallampati: II  TM Distance: >3 FB Neck ROM: Full    Dental no notable dental hx. (+) Teeth Intact, Dental Advisory Given   Pulmonary neg pulmonary ROS, former smoker,    Pulmonary exam normal breath sounds clear to auscultation       Cardiovascular Exercise Tolerance: Good hypertension, Pt. on medications  Rhythm:Regular Rate:Normal     Neuro/Psych Seizures -, Well Controlled,  negative psych ROS   GI/Hepatic Neg liver ROS, hiatal hernia, GERD  Controlled,  Endo/Other  diabetes, Type 2, Oral Hypoglycemic Agents  Renal/GU Renal InsufficiencyRenal disease  negative genitourinary   Musculoskeletal  (+) Arthritis , Osteoarthritis,    Abdominal   Peds  Hematology negative hematology ROS (+)   Anesthesia Other Findings   Reproductive/Obstetrics negative OB ROS                            Anesthesia Physical Anesthesia Plan  ASA: III  Anesthesia Plan: General   Post-op Pain Management:    Induction: Intravenous  PONV Risk Score and Plan: 3 and Ondansetron and Dexamethasone  Airway Management Planned: Oral ETT  Additional Equipment:   Intra-op Plan:   Post-operative Plan: Extubation in OR  Informed Consent: I have reviewed the patients History and Physical, chart, labs and discussed the procedure including the risks, benefits and alternatives for the proposed anesthesia with the patient or authorized representative who has indicated his/her understanding and acceptance.   Dental advisory given  Plan Discussed with: CRNA  Anesthesia Plan Comments:         Anesthesia Quick Evaluation

## 2017-06-02 ENCOUNTER — Ambulatory Visit (HOSPITAL_COMMUNITY): Payer: Medicare HMO | Admitting: Emergency Medicine

## 2017-06-02 ENCOUNTER — Ambulatory Visit (HOSPITAL_COMMUNITY): Payer: Medicare HMO

## 2017-06-02 ENCOUNTER — Encounter (HOSPITAL_COMMUNITY)
Admission: RE | Disposition: A | Discharge status: Home / Self Care | Payer: Self-pay | Source: Ambulatory Visit | Attending: Gastroenterology

## 2017-06-02 ENCOUNTER — Encounter (HOSPITAL_COMMUNITY): Payer: Self-pay | Admitting: Certified Registered Nurse Anesthetist

## 2017-06-02 ENCOUNTER — Ambulatory Visit (HOSPITAL_COMMUNITY)
Admission: RE | Admit: 2017-06-02 | Discharge: 2017-06-02 | Disposition: A | Discharge status: Home / Self Care | Payer: Medicare HMO | Source: Ambulatory Visit | Attending: Gastroenterology | Admitting: Gastroenterology

## 2017-06-02 DIAGNOSIS — N183 Chronic kidney disease, stage 3 (moderate): Secondary | ICD-10-CM | POA: Diagnosis not present

## 2017-06-02 DIAGNOSIS — K8050 Calculus of bile duct without cholangitis or cholecystitis without obstruction: Secondary | ICD-10-CM | POA: Diagnosis not present

## 2017-06-02 DIAGNOSIS — Z881 Allergy status to other antibiotic agents status: Secondary | ICD-10-CM | POA: Diagnosis not present

## 2017-06-02 DIAGNOSIS — K449 Diaphragmatic hernia without obstruction or gangrene: Secondary | ICD-10-CM | POA: Diagnosis not present

## 2017-06-02 DIAGNOSIS — Z538 Procedure and treatment not carried out for other reasons: Secondary | ICD-10-CM | POA: Diagnosis not present

## 2017-06-02 DIAGNOSIS — K8020 Calculus of gallbladder without cholecystitis without obstruction: Secondary | ICD-10-CM | POA: Diagnosis not present

## 2017-06-02 DIAGNOSIS — R933 Abnormal findings on diagnostic imaging of other parts of digestive tract: Secondary | ICD-10-CM

## 2017-06-02 DIAGNOSIS — Z7984 Long term (current) use of oral hypoglycemic drugs: Secondary | ICD-10-CM | POA: Diagnosis not present

## 2017-06-02 DIAGNOSIS — R1011 Right upper quadrant pain: Secondary | ICD-10-CM | POA: Diagnosis not present

## 2017-06-02 DIAGNOSIS — R748 Abnormal levels of other serum enzymes: Secondary | ICD-10-CM | POA: Diagnosis not present

## 2017-06-02 DIAGNOSIS — I129 Hypertensive chronic kidney disease with stage 1 through stage 4 chronic kidney disease, or unspecified chronic kidney disease: Secondary | ICD-10-CM | POA: Insufficient documentation

## 2017-06-02 DIAGNOSIS — E119 Type 2 diabetes mellitus without complications: Secondary | ICD-10-CM | POA: Diagnosis not present

## 2017-06-02 DIAGNOSIS — Z86010 Personal history of colonic polyps: Secondary | ICD-10-CM | POA: Diagnosis not present

## 2017-06-02 DIAGNOSIS — Z8719 Personal history of other diseases of the digestive system: Secondary | ICD-10-CM | POA: Insufficient documentation

## 2017-06-02 DIAGNOSIS — R945 Abnormal results of liver function studies: Secondary | ICD-10-CM | POA: Diagnosis not present

## 2017-06-02 DIAGNOSIS — R7989 Other specified abnormal findings of blood chemistry: Secondary | ICD-10-CM

## 2017-06-02 DIAGNOSIS — K5710 Diverticulosis of small intestine without perforation or abscess without bleeding: Secondary | ICD-10-CM | POA: Insufficient documentation

## 2017-06-02 DIAGNOSIS — I10 Essential (primary) hypertension: Secondary | ICD-10-CM | POA: Diagnosis not present

## 2017-06-02 DIAGNOSIS — E1122 Type 2 diabetes mellitus with diabetic chronic kidney disease: Secondary | ICD-10-CM | POA: Insufficient documentation

## 2017-06-02 DIAGNOSIS — K219 Gastro-esophageal reflux disease without esophagitis: Secondary | ICD-10-CM | POA: Insufficient documentation

## 2017-06-02 DIAGNOSIS — Z87891 Personal history of nicotine dependence: Secondary | ICD-10-CM | POA: Diagnosis not present

## 2017-06-02 DIAGNOSIS — Z6841 Body Mass Index (BMI) 40.0 and over, adult: Secondary | ICD-10-CM | POA: Insufficient documentation

## 2017-06-02 DIAGNOSIS — M8580 Other specified disorders of bone density and structure, unspecified site: Secondary | ICD-10-CM | POA: Insufficient documentation

## 2017-06-02 DIAGNOSIS — E669 Obesity, unspecified: Secondary | ICD-10-CM | POA: Diagnosis not present

## 2017-06-02 DIAGNOSIS — M1990 Unspecified osteoarthritis, unspecified site: Secondary | ICD-10-CM | POA: Diagnosis not present

## 2017-06-02 HISTORY — PX: ENDOSCOPIC RETROGRADE CHOLANGIOPANCREATOGRAPHY (ERCP) WITH PROPOFOL: SHX5810

## 2017-06-02 HISTORY — DX: Personal history of other diseases of the digestive system: Z87.19

## 2017-06-02 HISTORY — DX: Unspecified osteoarthritis, unspecified site: M19.90

## 2017-06-02 LAB — GLUCOSE, CAPILLARY: Glucose-Capillary: 98 mg/dL (ref 65–99)

## 2017-06-02 SURGERY — ENDOSCOPIC RETROGRADE CHOLANGIOPANCREATOGRAPHY (ERCP) WITH PROPOFOL
Anesthesia: General

## 2017-06-02 MED ORDER — LACTATED RINGERS IV SOLN
INTRAVENOUS | Status: DC | PRN
  Administered 2017-06-02: 07:00:00 via INTRAVENOUS

## 2017-06-02 MED ORDER — DEXAMETHASONE SODIUM PHOSPHATE 10 MG/ML IJ SOLN
INTRAMUSCULAR | Status: DC | PRN
  Administered 2017-06-02: 5 mg via INTRAVENOUS

## 2017-06-02 MED ORDER — INDOMETHACIN 50 MG RE SUPP
RECTAL | Status: AC
  Filled 2017-06-02: qty 2

## 2017-06-02 MED ORDER — IOPAMIDOL (ISOVUE-300) INJECTION 61%
INTRAVENOUS | Status: AC
  Filled 2017-06-02: qty 50

## 2017-06-02 MED ORDER — ROCURONIUM BROMIDE 100 MG/10ML IV SOLN
INTRAVENOUS | Status: DC | PRN
  Administered 2017-06-02: 40 mg via INTRAVENOUS
  Administered 2017-06-02: 10 mg via INTRAVENOUS

## 2017-06-02 MED ORDER — PHENYLEPHRINE HCL 10 MG/ML IJ SOLN
INTRAMUSCULAR | Status: DC | PRN
  Administered 2017-06-02: 120 ug via INTRAVENOUS
  Administered 2017-06-02 (×2): 80 ug via INTRAVENOUS

## 2017-06-02 MED ORDER — SUGAMMADEX SODIUM 200 MG/2ML IV SOLN
INTRAVENOUS | Status: DC | PRN
  Administered 2017-06-02: 400 mg via INTRAVENOUS

## 2017-06-02 MED ORDER — GLUCAGON HCL RDNA (DIAGNOSTIC) 1 MG IJ SOLR
INTRAMUSCULAR | Status: AC
  Filled 2017-06-02: qty 1

## 2017-06-02 MED ORDER — INDOMETHACIN 50 MG RE SUPP: 100.0000 mg | Freq: Once | RECTAL | Status: DC

## 2017-06-02 MED ORDER — LIDOCAINE HCL (CARDIAC) 20 MG/ML IV SOLN
INTRAVENOUS | Status: DC | PRN
  Administered 2017-06-02: 60 mg via INTRAVENOUS

## 2017-06-02 MED ORDER — SODIUM CHLORIDE 0.9 % IV SOLN: INTRAVENOUS | Status: DC

## 2017-06-02 MED ORDER — AMPICILLIN-SULBACTAM SODIUM 1.5 (1-0.5) G IJ SOLR
1.5000 g | INTRAMUSCULAR | Status: AC
  Administered 2017-06-02: 1.5 g via INTRAVENOUS
  Filled 2017-06-02: qty 1.5

## 2017-06-02 MED ORDER — PROPOFOL 10 MG/ML IV BOLUS
INTRAVENOUS | Status: DC | PRN
  Administered 2017-06-02: 120 mg via INTRAVENOUS
  Administered 2017-06-02: 60 mg via INTRAVENOUS

## 2017-06-02 MED ORDER — FENTANYL CITRATE (PF) 100 MCG/2ML IJ SOLN
INTRAMUSCULAR | Status: DC | PRN
  Administered 2017-06-02: 100 ug via INTRAVENOUS

## 2017-06-02 MED ORDER — ONDANSETRON HCL 4 MG/2ML IJ SOLN
INTRAMUSCULAR | Status: DC | PRN
  Administered 2017-06-02: 4 mg via INTRAVENOUS

## 2017-06-02 MED ORDER — GLUCAGON HCL RDNA (DIAGNOSTIC) 1 MG IJ SOLR
INTRAMUSCULAR | Status: DC | PRN
  Administered 2017-06-02: .25 mg via INTRAVENOUS

## 2017-06-02 NOTE — Discharge Instructions (Signed)
YOU HAD AN ENDOSCOPIC PROCEDURE TODAY: Refer to the procedure report and other information in the discharge instructions given to you for any specific questions about what was found during the examination. If this information does not answer your questions, please call [place] office at [phone] to clarify.   YOU SHOULD EXPECT: Some feelings of bloating in the abdomen. Passage of more gas than usual. Walking can help get rid of the air that was put into your GI tract during the procedure and reduce the bloating. If you had a lower endoscopy (such as a colonoscopy or flexible sigmoidoscopy) you may notice spotting of blood in your stool or on the toilet paper. Some abdominal soreness may be present for a day or two, also.  DIET: Your first meal following the procedure should be a light meal and then it is ok to progress to your normal diet. A half-sandwich or bowl of soup is an example of a good first meal. Heavy or fried foods are harder to digest and may make you feel nauseous or bloated. Drink plenty of fluids but you should avoid alcoholic beverages for 24 hours. If you had a esophageal dilation, please see attached instructions for diet.    ACTIVITY: Your care partner should take you home directly after the procedure. You should plan to take it easy, moving slowly for the rest of the day. You can resume normal activity the day after the procedure however YOU SHOULD NOT DRIVE, use power tools, machinery or perform tasks that involve climbing or major physical exertion for 24 hours (because of the sedation medicines used during the test).   SYMPTOMS TO REPORT IMMEDIATELY: A gastroenterologist can be reached at any hour. Please call [phone]  for any of the following symptoms:  Following lower endoscopy (colonoscopy, flexible sigmoidoscopy) Excessive amounts of blood in the stool  Significant tenderness, worsening of abdominal pains  Swelling of the abdomen that is new, acute  Fever of 100 or  higher  Following upper endoscopy (EGD, EUS, ERCP, esophageal dilation) Vomiting of blood or coffee ground material  New, significant abdominal pain  New, significant chest pain or pain under the shoulder blades  Painful or persistently difficult swallowing  New shortness of breath  Black, tarry-looking or red, bloody stools  FOLLOW UP:  If any biopsies were taken you will be contacted by phone or by letter within the next 1-3 weeks. Call [phone]  if you have not heard about the biopsies in 3 weeks.  Please also call with any specific questions about appointments or follow up tests.    General Anesthesia, Adult, Care After These instructions provide you with information about caring for yourself after your procedure. Your health care provider may also give you more specific instructions. Your treatment has been planned according to current medical practices, but problems sometimes occur. Call your health care provider if you have any problems or questions after your procedure. What can I expect after the procedure? After the procedure, it is common to have:  Vomiting.  A sore throat.  Mental slowness.  It is common to feel:  Nauseous.  Cold or shivery.  Sleepy.  Tired.  Sore or achy, even in parts of your body where you did not have surgery.  Follow these instructions at home: For at least 24 hours after the procedure:  Do not: ? Participate in activities where you could fall or become injured. ? Drive. ? Use heavy machinery. ? Drink alcohol. ? Take sleeping pills or medicines that  cause drowsiness. ? Make important decisions or sign legal documents. ? Take care of children on your own.  Rest. Eating and drinking  If you vomit, drink water, juice, or soup when you can drink without vomiting.  Drink enough fluid to keep your urine clear or pale yellow.  Make sure you have little or no nausea before eating solid foods.  Follow the diet recommended by your  health care provider. General instructions  Have a responsible adult stay with you until you are awake and alert.  Return to your normal activities as told by your health care provider. Ask your health care provider what activities are safe for you.  Take over-the-counter and prescription medicines only as told by your health care provider.  If you smoke, do not smoke without supervision.  Keep all follow-up visits as told by your health care provider. This is important. Contact a health care provider if:  You continue to have nausea or vomiting at home, and medicines are not helpful.  You cannot drink fluids or start eating again.  You cannot urinate after 8-12 hours.  You develop a skin rash.  You have fever.  You have increasing redness at the site of your procedure. Get help right away if:  You have difficulty breathing.  You have chest pain.  You have unexpected bleeding.  You feel that you are having a life-threatening or urgent problem. This information is not intended to replace advice given to you by your health care provider. Make sure you discuss any questions you have with your health care provider. Document Released: 09/26/2000 Document Revised: 11/23/2015 Document Reviewed: 06/04/2015 Elsevier Interactive Patient Education  [employer].

## 2017-06-02 NOTE — Op Note (Signed)
[facility] Patient Name: Pamela [name] Procedure Date : 06/02/2017 MRN: [id number] Attending MD: [name] , MD Date of Birth: 08-30-1934 CSN: [id number] Age: 82 Admit Type: Outpatient Procedure:                ERCP Indications:              Abdominal pain of suspected biliary origin, Bile                            duct stone(s) on MRCP, Dilated biliary tree,                            Elevated liver enzymes Providers:                Pricilla Riffle. Fuller Plan, MD, Zenon Mayo, RN, Elspeth Cho Tech., Technician, Lavona Mound, CRNA Referring MD:              Medicines:                General Anesthesia Complications:            No immediate complications. Estimated Blood Loss:     Estimated blood loss: none. Procedure:                Pre-Anesthesia Assessment:                           - Prior to the procedure, a History and Physical                            was performed, and patient medications and                            allergies were reviewed. The patient's tolerance of                            previous anesthesia was also reviewed. The risks                            and benefits of the procedure and the sedation                            options and risks were discussed with the patient.                            All questions were answered, and informed consent                            was obtained. Prior Anticoagulants: The patient has                            taken no previous anticoagulant or antiplatelet  agents. ASA Grade Assessment: III - A patient with                            severe systemic disease. After reviewing the risks                            and benefits, the patient was deemed in                            satisfactory condition to undergo the procedure.                           After obtaining informed consent, the scope was                            passed under direct  vision. Throughout the                            procedure, the patient's blood pressure, pulse, and                            oxygen saturations were monitored continuously. The                            [id number] scope was introduced through the                            mouth, and used to locate the major papilla. The                            ERCP was technically difficult and complex due to                            inability to visualize the major papilla. The                            patient tolerated the procedure well. Scope In: Scope Out: Findings:      A scout film of the abdomen was obtained. Surgical clips, consistent       with a previous cholecystectomy, were seen in the area of the right       upper quadrant of the abdomen. The esophagus was successfully intubated       under direct vision. The scope was advanced to the descending duodenum       without detailed examination of the pharynx, larynx and associated       structures, and upper GI tract. The upper GI tract was grossly normal       except for a large periampullary diverticulum. Debris in the       diverticulum was removed and the duodenum and diverticulum were       carefully inspected however I could not locate the major papilla. I       repositioned the scope multiple times and used the sphincterotome to       move folds and manipulate the edges of the diverticulum. I had  several       partial views of the base of the diverticulum and very good views of the       entire rim of the diverticulum. The procedure was terminated when the       major papilla could not be located. Impression:               - Large periampullary diverticulum.                           - Unable to locate the major papilla due to                            abnormal anatomy. Recommendation:           - The patient will be observed post-procedure,                            until all discharge criteria are met.                            - Patient has a contact number available for                            emergencies. The signs and symptoms of potential                            delayed complications were discussed with the                            patient. Return to normal activities tomorrow.                            Written discharge instructions were provided to the                            patient.                           - Resume previous diet today.                           - Refer to a biliary endoscopist at a tertiary                            center or PTC at the next available appointment.                            Will discuss options with patient/family. Procedure Code(s):        --- Professional ---                           [phone], Esophagogastroduodenoscopy, flexible,                            transoral; diagnostic, including collection of  specimen(s) by brushing or washing, when performed                            (separate procedure) Diagnosis Code(s):        --- Professional ---                           Z53.8, Procedure and treatment not carried out for                            other reasons                           R10.9, Unspecified abdominal pain                           K80.50, Calculus of bile duct without cholangitis                            or cholecystitis without obstruction                           R74.8, Abnormal levels of other serum enzymes CPT copyright 2016 American Medical Association. All rights reserved. The codes documented in this report are preliminary and upon coder review may  be revised to meet current compliance requirements. [name], MD 06/02/2017 9:05:43 AM This report has been signed electronically. Number of Addenda: 0

## 2017-06-02 NOTE — Interval H&P Note (Signed)
History and Physical Interval Note:  06/02/2017 8:02 AM  Pamela [name] Pain  has presented today for surgery, with the diagnosis of choledocholithiasis  The various methods of treatment have been discussed with the patient and family. After consideration of risks, benefits and other options for treatment, the patient has consented to  Procedure(s): ENDOSCOPIC RETROGRADE CHOLANGIOPANCREATOGRAPHY (ERCP) WITH PROPOFOL (N/A) as a surgical intervention .  The patient's history has been reviewed, patient examined, no change in status, stable for surgery.  I have reviewed the patient's chart and labs.  Questions were answered to the patient's satisfaction.     Pricilla Riffle. Fuller Plan

## 2017-06-02 NOTE — Interval H&P Note (Signed)
History and Physical Interval Note:  06/02/2017 8:03 AM  Pamela [name]  has presented today for surgery, with the diagnosis of choledocholithiasis  The various methods of treatment have been discussed with the patient and family. After consideration of risks, benefits and other options for treatment, the patient has consented to  Procedure(s): ENDOSCOPIC RETROGRADE CHOLANGIOPANCREATOGRAPHY (ERCP) WITH PROPOFOL (N/A) as a surgical intervention .  The patient's history has been reviewed, patient examined, no change in status, stable for surgery.  I have reviewed the patient's chart and labs.  Questions were answered to the patient's satisfaction.     Pricilla Riffle. Fuller Plan

## 2017-06-02 NOTE — Anesthesia Postprocedure Evaluation (Signed)
Anesthesia Post Note  Patient: Pamela [name]  Procedure(s) Performed: ENDOSCOPIC RETROGRADE CHOLANGIOPANCREATOGRAPHY (ERCP) WITH PROPOFOL (N/A )     Patient location during evaluation: PACU Anesthesia Type: General Level of consciousness: awake and alert Pain management: pain level controlled Vital Signs Assessment: post-procedure vital signs reviewed and stable Respiratory status: spontaneous breathing, nonlabored ventilation and respiratory function stable Cardiovascular status: blood pressure returned to baseline and stable Postop Assessment: no apparent nausea or vomiting Anesthetic complications: no    Last Vitals:  Vitals:   06/02/17 0930 06/02/17 0945  BP: (!) 140/45 (!) 152/65  Pulse: 88 (!) 37  Resp: 19 (!) 22  Temp:    SpO2: 92% 94%    Last Pain:  Vitals:   06/02/17 0857  TempSrc: Oral                 Everli Rother,W. EDMOND

## 2017-06-02 NOTE — Anesthesia Procedure Notes (Signed)
Procedure Name: Intubation Date/Time: 06/02/2017 8:11 AM Performed by: White, Amedeo Plenty, CRNA Pre-anesthesia Checklist: Patient identified, Emergency Drugs available, Suction available and Patient being monitored Patient Re-evaluated:Patient Re-evaluated prior to induction Oxygen Delivery Method: Circle System Utilized Preoxygenation: Pre-oxygenation with 100% oxygen Induction Type: IV induction Ventilation: Mask ventilation without difficulty and Oral airway inserted - appropriate to patient size Laryngoscope Size: Mac and 3 Grade View: Grade I Tube type: Oral Tube size: 7.0 mm Number of attempts: 1 Airway Equipment and Method: Stylet and Oral airway Placement Confirmation: ETT inserted through vocal cords under direct vision,  positive ETCO2 and breath sounds checked- equal and bilateral Secured at: 21 cm Tube secured with: Tape Dental Injury: Teeth and Oropharynx as per pre-operative assessment

## 2017-06-02 NOTE — Transfer of Care (Signed)
Immediate Anesthesia Transfer of Care Note  Patient: Pamela [name]  Procedure(s) Performed: ENDOSCOPIC RETROGRADE CHOLANGIOPANCREATOGRAPHY (ERCP) WITH PROPOFOL (N/A )  Patient Location: Endoscopy Unit  Anesthesia Type:General  Level of Consciousness: awake, alert , oriented and patient cooperative  Airway & Oxygen Therapy: Patient Spontanous Breathing and Patient connected to nasal cannula oxygen  Post-op Assessment: Report given to RN and Post -op Vital signs reviewed and stable  Post vital signs: Reviewed and stable  Last Vitals:  Vitals:   06/02/17 0714  BP: (!) 173/85  Pulse: 100  Resp: 20  Temp: 36.6 C  SpO2: 95%    Last Pain:  Vitals:   06/02/17 0714  TempSrc: Oral         Complications: No apparent anesthesia complications

## 2017-06-03 ENCOUNTER — Encounter (HOSPITAL_COMMUNITY): Payer: Self-pay | Admitting: Gastroenterology

## 2017-06-05 ENCOUNTER — Telehealth: Payer: Self-pay

## 2017-06-05 NOTE — Telephone Encounter (Signed)
Patient agrees for referral to [facility].  She is notified that I will fax the referral and they will contact her directly with appt.

## 2017-06-05 NOTE — Telephone Encounter (Signed)
-----  Message from [name], MD sent at 06/02/2017 12:12 PM EST ----- Please call the patient on Monday to determine their choice for the next step.  At ERCP I was unable to find the major papilla due to a large periampullary diverticulum. Recommended ERCP at [facility] or [facility]. I told them I [facility] is my recommendation however it is their choice.  Second best option is PTC here.

## 2017-06-20 ENCOUNTER — Telehealth: Payer: Self-pay | Admitting: Gastroenterology

## 2017-06-20 NOTE — Telephone Encounter (Signed)
I spoke with Pamela [name] at [facility] she needs a copy of the MRI/MRCP performed at [facility].  I obtained a copy and faxed it to her at the requested fax number

## 2017-06-22 DIAGNOSIS — N183 Chronic kidney disease, stage 3 unspecified: Secondary | ICD-10-CM | POA: Diagnosis present

## 2017-06-22 DIAGNOSIS — Z6841 Body Mass Index (BMI) 40.0 and over, adult: Secondary | ICD-10-CM

## 2017-06-22 DIAGNOSIS — E782 Mixed hyperlipidemia: Secondary | ICD-10-CM | POA: Diagnosis present

## 2017-06-22 DIAGNOSIS — E66813 Obesity, class 3: Secondary | ICD-10-CM

## 2017-06-22 DIAGNOSIS — G8929 Other chronic pain: Secondary | ICD-10-CM | POA: Diagnosis present

## 2017-06-22 DIAGNOSIS — R569 Unspecified convulsions: Secondary | ICD-10-CM | POA: Insufficient documentation

## 2017-06-22 DIAGNOSIS — E119 Type 2 diabetes mellitus without complications: Secondary | ICD-10-CM

## 2017-06-22 DIAGNOSIS — K8050 Calculus of bile duct without cholangitis or cholecystitis without obstruction: Secondary | ICD-10-CM

## 2017-06-22 DIAGNOSIS — E785 Hyperlipidemia, unspecified: Secondary | ICD-10-CM | POA: Diagnosis present

## 2017-06-22 HISTORY — DX: Calculus of bile duct without cholangitis or cholecystitis without obstruction: K80.50

## 2017-07-12 DIAGNOSIS — E7800 Pure hypercholesterolemia, unspecified: Secondary | ICD-10-CM | POA: Diagnosis not present

## 2017-07-12 DIAGNOSIS — E1122 Type 2 diabetes mellitus with diabetic chronic kidney disease: Secondary | ICD-10-CM | POA: Diagnosis not present

## 2017-07-12 DIAGNOSIS — R7989 Other specified abnormal findings of blood chemistry: Secondary | ICD-10-CM | POA: Diagnosis not present

## 2017-07-12 DIAGNOSIS — N183 Chronic kidney disease, stage 3 (moderate): Secondary | ICD-10-CM | POA: Diagnosis not present

## 2017-07-12 DIAGNOSIS — I10 Essential (primary) hypertension: Secondary | ICD-10-CM | POA: Diagnosis not present

## 2017-07-12 DIAGNOSIS — G4700 Insomnia, unspecified: Secondary | ICD-10-CM | POA: Diagnosis not present

## 2017-10-26 DIAGNOSIS — Z961 Presence of intraocular lens: Secondary | ICD-10-CM | POA: Diagnosis not present

## 2017-10-26 DIAGNOSIS — H524 Presbyopia: Secondary | ICD-10-CM | POA: Diagnosis not present

## 2017-10-26 DIAGNOSIS — H26491 Other secondary cataract, right eye: Secondary | ICD-10-CM | POA: Diagnosis not present

## 2017-11-29 DIAGNOSIS — I10 Essential (primary) hypertension: Secondary | ICD-10-CM | POA: Diagnosis not present

## 2017-11-29 DIAGNOSIS — N183 Chronic kidney disease, stage 3 (moderate): Secondary | ICD-10-CM | POA: Diagnosis not present

## 2017-11-29 DIAGNOSIS — M1990 Unspecified osteoarthritis, unspecified site: Secondary | ICD-10-CM | POA: Diagnosis not present

## 2017-11-29 DIAGNOSIS — G4700 Insomnia, unspecified: Secondary | ICD-10-CM | POA: Diagnosis not present

## 2017-11-29 DIAGNOSIS — D631 Anemia in chronic kidney disease: Secondary | ICD-10-CM | POA: Diagnosis not present

## 2017-11-29 DIAGNOSIS — Z0000 Encounter for general adult medical examination without abnormal findings: Secondary | ICD-10-CM | POA: Diagnosis not present

## 2017-11-29 DIAGNOSIS — E7800 Pure hypercholesterolemia, unspecified: Secondary | ICD-10-CM | POA: Diagnosis not present

## 2017-11-29 DIAGNOSIS — E1122 Type 2 diabetes mellitus with diabetic chronic kidney disease: Secondary | ICD-10-CM | POA: Diagnosis not present

## 2018-04-16 DIAGNOSIS — R110 Nausea: Secondary | ICD-10-CM | POA: Diagnosis not present

## 2018-04-16 DIAGNOSIS — R109 Unspecified abdominal pain: Secondary | ICD-10-CM | POA: Diagnosis not present

## 2018-05-17 DIAGNOSIS — Z23 Encounter for immunization: Secondary | ICD-10-CM | POA: Diagnosis not present

## 2018-06-04 DIAGNOSIS — N183 Chronic kidney disease, stage 3 (moderate): Secondary | ICD-10-CM | POA: Diagnosis not present

## 2018-06-04 DIAGNOSIS — E1122 Type 2 diabetes mellitus with diabetic chronic kidney disease: Secondary | ICD-10-CM | POA: Diagnosis not present

## 2018-06-04 DIAGNOSIS — I129 Hypertensive chronic kidney disease with stage 1 through stage 4 chronic kidney disease, or unspecified chronic kidney disease: Secondary | ICD-10-CM | POA: Diagnosis not present

## 2018-06-04 DIAGNOSIS — M1990 Unspecified osteoarthritis, unspecified site: Secondary | ICD-10-CM | POA: Diagnosis not present

## 2018-06-04 DIAGNOSIS — E7800 Pure hypercholesterolemia, unspecified: Secondary | ICD-10-CM | POA: Diagnosis not present

## 2018-06-04 DIAGNOSIS — D631 Anemia in chronic kidney disease: Secondary | ICD-10-CM | POA: Diagnosis not present

## 2018-06-04 DIAGNOSIS — N2581 Secondary hyperparathyroidism of renal origin: Secondary | ICD-10-CM | POA: Diagnosis not present

## 2018-06-04 DIAGNOSIS — I700 Atherosclerosis of aorta: Secondary | ICD-10-CM | POA: Diagnosis not present

## 2018-08-01 DIAGNOSIS — R69 Illness, unspecified: Secondary | ICD-10-CM | POA: Diagnosis not present

## 2018-08-07 DIAGNOSIS — R69 Illness, unspecified: Secondary | ICD-10-CM | POA: Diagnosis not present

## 2018-08-22 DIAGNOSIS — N183 Chronic kidney disease, stage 3 (moderate): Secondary | ICD-10-CM | POA: Diagnosis not present

## 2018-08-22 DIAGNOSIS — N189 Chronic kidney disease, unspecified: Secondary | ICD-10-CM | POA: Diagnosis not present

## 2018-08-22 DIAGNOSIS — N2581 Secondary hyperparathyroidism of renal origin: Secondary | ICD-10-CM | POA: Diagnosis not present

## 2018-08-31 DIAGNOSIS — N183 Chronic kidney disease, stage 3 (moderate): Secondary | ICD-10-CM | POA: Diagnosis not present

## 2018-08-31 DIAGNOSIS — N2581 Secondary hyperparathyroidism of renal origin: Secondary | ICD-10-CM | POA: Diagnosis not present

## 2018-08-31 DIAGNOSIS — D631 Anemia in chronic kidney disease: Secondary | ICD-10-CM | POA: Diagnosis not present

## 2018-08-31 DIAGNOSIS — I129 Hypertensive chronic kidney disease with stage 1 through stage 4 chronic kidney disease, or unspecified chronic kidney disease: Secondary | ICD-10-CM | POA: Diagnosis not present

## 2018-11-03 ENCOUNTER — Encounter (HOSPITAL_BASED_OUTPATIENT_CLINIC_OR_DEPARTMENT_OTHER): Payer: Self-pay | Admitting: Emergency Medicine

## 2018-11-03 ENCOUNTER — Inpatient Hospital Stay (HOSPITAL_BASED_OUTPATIENT_CLINIC_OR_DEPARTMENT_OTHER)
Admission: EM | Admit: 2018-11-03 | Discharge: 2018-11-07 | DRG: 392 | Disposition: A | Discharge status: Home / Self Care | Payer: Medicare HMO | Attending: Internal Medicine | Admitting: Internal Medicine

## 2018-11-03 ENCOUNTER — Emergency Department (HOSPITAL_BASED_OUTPATIENT_CLINIC_OR_DEPARTMENT_OTHER): Payer: Medicare HMO

## 2018-11-03 ENCOUNTER — Other Ambulatory Visit: Payer: Self-pay

## 2018-11-03 DIAGNOSIS — E119 Type 2 diabetes mellitus without complications: Secondary | ICD-10-CM | POA: Diagnosis not present

## 2018-11-03 DIAGNOSIS — R0602 Shortness of breath: Secondary | ICD-10-CM

## 2018-11-03 DIAGNOSIS — Z87891 Personal history of nicotine dependence: Secondary | ICD-10-CM | POA: Diagnosis not present

## 2018-11-03 DIAGNOSIS — K631 Perforation of intestine (nontraumatic): Secondary | ICD-10-CM | POA: Diagnosis not present

## 2018-11-03 DIAGNOSIS — K219 Gastro-esophageal reflux disease without esophagitis: Secondary | ICD-10-CM | POA: Diagnosis present

## 2018-11-03 DIAGNOSIS — R918 Other nonspecific abnormal finding of lung field: Secondary | ICD-10-CM | POA: Diagnosis not present

## 2018-11-03 DIAGNOSIS — K5710 Diverticulosis of small intestine without perforation or abscess without bleeding: Secondary | ICD-10-CM | POA: Diagnosis not present

## 2018-11-03 DIAGNOSIS — K567 Ileus, unspecified: Secondary | ICD-10-CM

## 2018-11-03 DIAGNOSIS — E876 Hypokalemia: Secondary | ICD-10-CM | POA: Diagnosis present

## 2018-11-03 DIAGNOSIS — M1711 Unilateral primary osteoarthritis, right knee: Secondary | ICD-10-CM | POA: Diagnosis not present

## 2018-11-03 DIAGNOSIS — K8050 Calculus of bile duct without cholangitis or cholecystitis without obstruction: Secondary | ICD-10-CM | POA: Diagnosis not present

## 2018-11-03 DIAGNOSIS — N183 Chronic kidney disease, stage 3 unspecified: Secondary | ICD-10-CM | POA: Diagnosis present

## 2018-11-03 DIAGNOSIS — Z79899 Other long term (current) drug therapy: Secondary | ICD-10-CM | POA: Diagnosis not present

## 2018-11-03 DIAGNOSIS — R911 Solitary pulmonary nodule: Secondary | ICD-10-CM | POA: Diagnosis present

## 2018-11-03 DIAGNOSIS — M25561 Pain in right knee: Secondary | ICD-10-CM | POA: Diagnosis not present

## 2018-11-03 DIAGNOSIS — Z6841 Body Mass Index (BMI) 40.0 and over, adult: Secondary | ICD-10-CM | POA: Diagnosis not present

## 2018-11-03 DIAGNOSIS — K5700 Diverticulitis of small intestine with perforation and abscess without bleeding: Principal | ICD-10-CM | POA: Diagnosis present

## 2018-11-03 DIAGNOSIS — I129 Hypertensive chronic kidney disease with stage 1 through stage 4 chronic kidney disease, or unspecified chronic kidney disease: Secondary | ICD-10-CM | POA: Diagnosis present

## 2018-11-03 DIAGNOSIS — Z1159 Encounter for screening for other viral diseases: Secondary | ICD-10-CM

## 2018-11-03 DIAGNOSIS — E782 Mixed hyperlipidemia: Secondary | ICD-10-CM | POA: Diagnosis present

## 2018-11-03 DIAGNOSIS — Z86010 Personal history of colonic polyps: Secondary | ICD-10-CM

## 2018-11-03 DIAGNOSIS — Z9049 Acquired absence of other specified parts of digestive tract: Secondary | ICD-10-CM | POA: Diagnosis not present

## 2018-11-03 DIAGNOSIS — Z66 Do not resuscitate: Secondary | ICD-10-CM | POA: Diagnosis present

## 2018-11-03 DIAGNOSIS — E66813 Obesity, class 3: Secondary | ICD-10-CM

## 2018-11-03 DIAGNOSIS — E785 Hyperlipidemia, unspecified: Secondary | ICD-10-CM | POA: Diagnosis present

## 2018-11-03 DIAGNOSIS — E1169 Type 2 diabetes mellitus with other specified complication: Secondary | ICD-10-CM | POA: Diagnosis not present

## 2018-11-03 DIAGNOSIS — K5792 Diverticulitis of intestine, part unspecified, without perforation or abscess without bleeding: Secondary | ICD-10-CM | POA: Diagnosis not present

## 2018-11-03 DIAGNOSIS — Z90710 Acquired absence of both cervix and uterus: Secondary | ICD-10-CM

## 2018-11-03 DIAGNOSIS — G8929 Other chronic pain: Secondary | ICD-10-CM | POA: Diagnosis present

## 2018-11-03 DIAGNOSIS — Z860101 Personal history of adenomatous and serrated colon polyps: Secondary | ICD-10-CM

## 2018-11-03 DIAGNOSIS — K5730 Diverticulosis of large intestine without perforation or abscess without bleeding: Secondary | ICD-10-CM | POA: Diagnosis not present

## 2018-11-03 DIAGNOSIS — M25461 Effusion, right knee: Secondary | ICD-10-CM | POA: Diagnosis not present

## 2018-11-03 DIAGNOSIS — K5720 Diverticulitis of large intestine with perforation and abscess without bleeding: Secondary | ICD-10-CM | POA: Diagnosis not present

## 2018-11-03 DIAGNOSIS — Z7984 Long term (current) use of oral hypoglycemic drugs: Secondary | ICD-10-CM

## 2018-11-03 DIAGNOSIS — I10 Essential (primary) hypertension: Secondary | ICD-10-CM | POA: Diagnosis present

## 2018-11-03 HISTORY — DX: Diverticulosis of small intestine without perforation or abscess without bleeding: K57.10

## 2018-11-03 HISTORY — DX: Calculus of bile duct without cholangitis or cholecystitis without obstruction: K80.50

## 2018-11-03 LAB — LIPASE, BLOOD: Lipase: 26 U/L (ref 11–51)

## 2018-11-03 LAB — URINALYSIS, MICROSCOPIC (REFLEX): WBC, UA: NONE SEEN WBC/hpf (ref 0–5)

## 2018-11-03 LAB — CBC WITH DIFFERENTIAL/PLATELET
Abs Immature Granulocytes: 0.04 10*3/uL (ref 0.00–0.07)
Basophils Absolute: 0 10*3/uL (ref 0.0–0.1)
Basophils Relative: 0 %
Eosinophils Absolute: 0.1 10*3/uL (ref 0.0–0.5)
Eosinophils Relative: 0 %
HCT: 39.6 % (ref 36.0–46.0)
Hemoglobin: 12.8 g/dL (ref 12.0–15.0)
Immature Granulocytes: 0 %
Lymphocytes Relative: 12 %
Lymphs Abs: 1.3 10*3/uL (ref 0.7–4.0)
MCH: 30 pg (ref 26.0–34.0)
MCHC: 32.3 g/dL (ref 30.0–36.0)
MCV: 92.7 fL (ref 80.0–100.0)
Monocytes Absolute: 1 10*3/uL (ref 0.1–1.0)
Monocytes Relative: 9 %
Neutro Abs: 8.7 10*3/uL — ABNORMAL HIGH (ref 1.7–7.7)
Neutrophils Relative %: 79 %
Platelets: 196 10*3/uL (ref 150–400)
RBC: 4.27 MIL/uL (ref 3.87–5.11)
RDW: 14.2 % (ref 11.5–15.5)
WBC: 11.1 10*3/uL — ABNORMAL HIGH (ref 4.0–10.5)
nRBC: 0 % (ref 0.0–0.2)

## 2018-11-03 LAB — URINALYSIS, ROUTINE W REFLEX MICROSCOPIC
Bilirubin Urine: NEGATIVE
Glucose, UA: NEGATIVE mg/dL
Ketones, ur: NEGATIVE mg/dL
Leukocytes,Ua: NEGATIVE
Nitrite: NEGATIVE
Protein, ur: 30 mg/dL — AB
Specific Gravity, Urine: 1.01 (ref 1.005–1.030)
pH: 6 (ref 5.0–8.0)

## 2018-11-03 LAB — COMPREHENSIVE METABOLIC PANEL
ALT: 11 U/L (ref 0–44)
AST: 14 U/L — ABNORMAL LOW (ref 15–41)
Albumin: 3.4 g/dL — ABNORMAL LOW (ref 3.5–5.0)
Alkaline Phosphatase: 50 U/L (ref 38–126)
Anion gap: 10 (ref 5–15)
BUN: 38 mg/dL — ABNORMAL HIGH (ref 8–23)
CO2: 24 mmol/L (ref 22–32)
Calcium: 9 mg/dL (ref 8.9–10.3)
Chloride: 102 mmol/L (ref 98–111)
Creatinine, Ser: 1.6 mg/dL — ABNORMAL HIGH (ref 0.44–1.00)
GFR calc Af Amer: 34 mL/min — ABNORMAL LOW (ref >60)
GFR calc non Af Amer: 30 mL/min — ABNORMAL LOW (ref >60)
Glucose, Bld: 109 mg/dL — ABNORMAL HIGH (ref 70–99)
Potassium: 4.1 mmol/L (ref 3.5–5.1)
Sodium: 136 mmol/L (ref 135–145)
Total Bilirubin: 0.8 mg/dL (ref 0.3–1.2)
Total Protein: 7.6 g/dL (ref 6.5–8.1)

## 2018-11-03 LAB — TROPONIN I: Troponin I: <0.03 ng/mL (ref <0.03)

## 2018-11-03 LAB — SARS CORONAVIRUS 2 AG (30 MIN TAT): SARS Coronavirus 2 Ag: NEGATIVE

## 2018-11-03 LAB — GLUCOSE, CAPILLARY: Glucose-Capillary: 92 mg/dL (ref 70–99)

## 2018-11-03 MED ORDER — METHOCARBAMOL 1000 MG/10ML IJ SOLN
1000.0000 mg | Freq: Four times a day (QID) | INTRAVENOUS | Status: DC | PRN
  Filled 2018-11-03 (×2): qty 10

## 2018-11-03 MED ORDER — LIP MEDEX EX OINT
TOPICAL_OINTMENT | CUTANEOUS | Status: AC
  Filled 2018-11-03: qty 7

## 2018-11-03 MED ORDER — LIP MEDEX EX OINT
1.0000 "application " | TOPICAL_OINTMENT | Freq: Two times a day (BID) | CUTANEOUS | Status: DC
  Administered 2018-11-04 – 2018-11-07 (×6): 1 via TOPICAL
  Filled 2018-11-03 (×3): qty 7

## 2018-11-03 MED ORDER — ACETAMINOPHEN 325 MG PO TABS: 650.0000 mg | ORAL_TABLET | Freq: Four times a day (QID) | ORAL | Status: DC | PRN

## 2018-11-03 MED ORDER — PIPERACILLIN-TAZOBACTAM 3.375 G IVPB 30 MIN
3.3750 g | Freq: Once | INTRAVENOUS | Status: AC
  Administered 2018-11-03: 3.375 g via INTRAVENOUS
  Filled 2018-11-03 (×2): qty 50

## 2018-11-03 MED ORDER — ALUM & MAG HYDROXIDE-SIMETH 200-200-20 MG/5ML PO SUSP: 30.0000 mL | Freq: Four times a day (QID) | ORAL | Status: DC | PRN

## 2018-11-03 MED ORDER — SODIUM CHLORIDE 0.9 % IV BOLUS
1000.0000 mL | Freq: Once | INTRAVENOUS | Status: AC
  Administered 2018-11-03: 1000 mL via INTRAVENOUS

## 2018-11-03 MED ORDER — PIPERACILLIN-TAZOBACTAM 3.375 G IVPB
3.3750 g | Freq: Three times a day (TID) | INTRAVENOUS | Status: DC
  Administered 2018-11-03 – 2018-11-07 (×10): 3.375 g via INTRAVENOUS
  Filled 2018-11-03 (×10): qty 50

## 2018-11-03 MED ORDER — MAGIC MOUTHWASH
15.0000 mL | Freq: Four times a day (QID) | ORAL | Status: DC | PRN
  Filled 2018-11-03: qty 15

## 2018-11-03 MED ORDER — SODIUM CHLORIDE 0.9 % IV SOLN
2.0000 g | Freq: Once | INTRAVENOUS | Status: DC
  Filled 2018-11-03: qty 20

## 2018-11-03 MED ORDER — HYDROCODONE-ACETAMINOPHEN 5-325 MG PO TABS
1.0000 | ORAL_TABLET | ORAL | Status: DC | PRN
  Administered 2018-11-06 – 2018-11-07 (×5): 1 via ORAL
  Filled 2018-11-03 (×5): qty 1

## 2018-11-03 MED ORDER — METRONIDAZOLE IN NACL 5-0.79 MG/ML-% IV SOLN
500.0000 mg | Freq: Once | INTRAVENOUS | Status: DC
  Filled 2018-11-03: qty 100

## 2018-11-03 MED ORDER — SODIUM CHLORIDE 0.9 % IV SOLN
INTRAVENOUS | Status: DC | PRN
  Administered 2018-11-05: 1000 mL via INTRAVENOUS

## 2018-11-03 MED ORDER — HYDROCORTISONE 1 % EX CREA
1.0000 "application " | TOPICAL_CREAM | Freq: Three times a day (TID) | CUTANEOUS | Status: DC | PRN
  Filled 2018-11-03: qty 28

## 2018-11-03 MED ORDER — HYDROCHLOROTHIAZIDE 12.5 MG PO CAPS
12.5000 mg | ORAL_CAPSULE | Freq: Every day | ORAL | Status: DC
  Administered 2018-11-03 – 2018-11-07 (×5): 12.5 mg via ORAL
  Filled 2018-11-03 (×5): qty 1

## 2018-11-03 MED ORDER — ONDANSETRON HCL 4 MG/2ML IJ SOLN: 4.0000 mg | Freq: Four times a day (QID) | INTRAMUSCULAR | Status: DC | PRN

## 2018-11-03 MED ORDER — ACETAMINOPHEN 650 MG RE SUPP: 650.0000 mg | Freq: Four times a day (QID) | RECTAL | Status: DC | PRN

## 2018-11-03 MED ORDER — CALCIUM CARBONATE 1250 (500 CA) MG PO TABS
1250.0000 mg | ORAL_TABLET | Freq: Two times a day (BID) | ORAL | Status: DC
  Administered 2018-11-05 – 2018-11-07 (×5): 1250 mg via ORAL
  Filled 2018-11-03 (×7): qty 1

## 2018-11-03 MED ORDER — ENOXAPARIN SODIUM 60 MG/0.6ML ~~LOC~~ SOLN
60.0000 mg | SUBCUTANEOUS | Status: DC
  Administered 2018-11-03 – 2018-11-06 (×4): 60 mg via SUBCUTANEOUS
  Filled 2018-11-03 (×4): qty 0.6

## 2018-11-03 MED ORDER — FENTANYL CITRATE (PF) 100 MCG/2ML IJ SOLN: 25.0000 ug | INTRAMUSCULAR | Status: DC | PRN

## 2018-11-03 MED ORDER — NYSTATIN 100000 UNIT/GM EX POWD
Freq: Two times a day (BID) | CUTANEOUS | Status: DC
  Administered 2018-11-03 – 2018-11-07 (×6): via TOPICAL
  Filled 2018-11-03: qty 15

## 2018-11-03 MED ORDER — LACTATED RINGERS IV BOLUS: 1000.0000 mL | Freq: Once | INTRAVENOUS | Status: DC

## 2018-11-03 MED ORDER — LACTATED RINGERS IV SOLN
INTRAVENOUS | Status: DC
  Administered 2018-11-03: 19:00:00 via INTRAVENOUS

## 2018-11-03 MED ORDER — CALCIUM CARBONATE 1500 (600 CA) MG PO TABS
1500.0000 mg | ORAL_TABLET | Freq: Two times a day (BID) | ORAL | Status: DC
  Filled 2018-11-03: qty 1

## 2018-11-03 MED ORDER — GUAIFENESIN-DM 100-10 MG/5ML PO SYRP: 10.0000 mL | ORAL_SOLUTION | ORAL | Status: DC | PRN

## 2018-11-03 MED ORDER — PROCHLORPERAZINE EDISYLATE 10 MG/2ML IJ SOLN: 5.0000 mg | INTRAMUSCULAR | Status: DC | PRN

## 2018-11-03 MED ORDER — LACTATED RINGERS IV BOLUS: 1000.0000 mL | Freq: Three times a day (TID) | INTRAVENOUS | Status: AC | PRN

## 2018-11-03 MED ORDER — PRAVASTATIN SODIUM 20 MG PO TABS
20.0000 mg | ORAL_TABLET | Freq: Every day | ORAL | Status: DC
  Administered 2018-11-04 – 2018-11-06 (×3): 20 mg via ORAL
  Filled 2018-11-03 (×3): qty 1

## 2018-11-03 MED ORDER — PHENOL 1.4 % MT LIQD: 1.0000 | OROMUCOSAL | Status: DC | PRN

## 2018-11-03 MED ORDER — LOSARTAN POTASSIUM-HCTZ 50-12.5 MG PO TABS: 1.0000 | ORAL_TABLET | Freq: Every morning | ORAL | Status: DC

## 2018-11-03 MED ORDER — SODIUM CHLORIDE 0.9 % IV SOLN
8.0000 mg | Freq: Four times a day (QID) | INTRAVENOUS | Status: DC | PRN
  Filled 2018-11-03: qty 4

## 2018-11-03 MED ORDER — MENTHOL 3 MG MT LOZG: 1.0000 | LOZENGE | OROMUCOSAL | Status: DC | PRN

## 2018-11-03 MED ORDER — HYDROCORTISONE (PERIANAL) 2.5 % EX CREA: 1.0000 "application " | TOPICAL_CREAM | Freq: Four times a day (QID) | CUTANEOUS | Status: DC | PRN

## 2018-11-03 MED ORDER — LOSARTAN POTASSIUM 50 MG PO TABS
50.0000 mg | ORAL_TABLET | Freq: Every day | ORAL | Status: DC
  Administered 2018-11-03 – 2018-11-07 (×5): 50 mg via ORAL
  Filled 2018-11-03 (×5): qty 1

## 2018-11-03 NOTE — Consult Note (Addendum)
Pamela [name]  07/24/1934 [id number]  CARE TEAM:  PCP: Orpah Melter, MD  Outpatient Care Team: Patient Care Team: Orpah Melter, MD as PCP - General (Family Medicine) [name], MD as Consulting Physician (Gastroenterology) Lorretta Harp, MD as Consulting Physician (Cardiology)  Inpatient Treatment Team: Treatment Team: Attending Provider: Desiree Hane, MD; Registered Nurse: Ezequiel Ganser, RN; Consulting Physician: Edison Pace, Md, MD   This patient is a 84 y.o.female who presents today for surgical evaluation at the request of Dr Melina Copa, [facility] ED.   Chief complaint / Reason for evaluation: Abdominal pain with probable contained perforation abdomen.  Probable small bowel diverticulitis   Morbidly obese woman with multiple medical problems including diabetes, chronic kidney disease, hypertension, orthopnea.  Complaint of abdominal pain persistent for several days.  Back pain as well.  Some nausea and bowel changes.  Worsening symptoms.  Came to emergency room.  CT scan shows central pocket of gas and solid material suspicious for perforation.  Primarily contiguous with small bowel, raising suspicion of Meckel's diverticulum versus ileal diverticulum.  Patient normally moves her bowels every day.  History of colon polyps.  Last colonoscopy 2016.  She has had some loose bowel movements the past few days since the pain started 2 days ago.  Pain seem to be more in the right lower abdomen but has been central and upper abdomen as well.  She figured she had gastroenteritis and was surprised with this diagnosis.  She does not recall any prior histories of diverticulitis.  She lives with her husband.  Patient also had episode of increased liver function tests in 2018 despite history of open cholecystectomy in the 1970s.  Ultimately required an open MRI/MRCP.  That did raise suspicion of a distal common bile duct stone.  ERCP attempted by Dr. Fuller Plan with [facility] gastroenterology.   It was aborted due to a large duodenal diverticulum making it technically difficult.  Patient was referred to [facility] to consider repeat attempt.  They desired cardiac clearance.  That did not happen.  Last note from February 2019 notes recommend follow-up if clearance done.  She refused to do that.  Never had anything else done.  Denies any symptoms liver function tests more normal now.  Seems consistent with the stone spontaneously passed  Assessment  [name]  84 y.o. female       Problem List:  Active Problems:   Intestine with microperforation (Sigmoid vs Meckel's)   Essential hypertension   Hx of adenomatous colonic polyps   Common bile duct stone   HLD (hyperlipidemia)   Chronic pain   Morbid obesity with BMI of 50.0-59.9, adult (HCC)   Type 2 diabetes mellitus, without long-term current use of insulin (HCC)   Duodenal diverticulum, periampullary   Diverticulosis of sigmoid colon   Probable contained microperforation in central abdomen, favor Meckels diverticulitis over sigmoid diverticulitis.  Plan:  Hospital admission.  Medical stabilization.  IV antibiotics. IV Zosyn.    Surgery will follow.   I reviewed the films with radiology.  To my view, concerned that this could perhaps be a large sigmoid diverticulum seen in 2018 that is now perforated with SB trying to wall off.  Dr. Polly Cobia with radiology favors small bowel diverticulitis like a Meckel's diverticulitis.    If it is more of a small bowel etiology, usually will require surgery to ultimately treat as risk for recurrence imminent with small bowel diverticulitis.  Ideally try to cool down this episode  and planned elective resection later to decrease it chance of leak or ostomy.  Hopefully could be just resection if Meckel's diverticulum versus segmental small bowel resection.    If this is more sigmoid, still may even need to consider colectomy as well given his complicated attack but that would be a more  morbid operation would definitely want medical and cardiac clearance before even considering that.   Ideally would try and cool this off with antibiotics and maybe even a drain to let this episode calm down and plan elective resection in the future.  Better chance of doing minimal invasive approach with anastomosis and avoid a temporary ostomy.  Ideally patient would get repeat colonoscopy prior to any colon surgery since it is been 4 years.  Obviously not do it now.  For now I would treat with IV antibiotics and bowel rest.  Try and hold off on any emergent surgical intervention if possible.  She is not appear to be in shock from this.  Hopefully avoid Hartmann resection.  if current inflammation can be controlled, could plan elective resection when risks of leak, abscess, ostomy are decreased.  That may not be possible.  She has not seen her cardiologist, Dr Gwenlyn Found,  in some time.  I think it may be wise to consider cardiac clearance or at the very least medical clearance to get a sense of what operative options are available for this woman.  Of note,elevated liver function tests with distal common bile duct stone was noted in 2018 by open outside MRCP/MRI.  ERCP was attempted by [facility] gastroenterology, but Dr. Fuller Plan was not able to safely do it given to a large periampullary duodenal diverticulum.  Sent to [facility].  They were awaiting cardiac clearance which never happened.  After 3 months, they waited to get feedback from the patient.  I do not know if anything happened.  She does not have elevated liver function test now.  She has very mild ductal dilatation that would be expected postcholecystectomy.  No major concerns on CAT scan review with Dr. Polly Cobia with radiology.  Diabetes per medicine service.  Assume sliding scale insulin.  Elevated creatinine.  Most likely within usual chronic kidney disease.  Defer to medicine.  VTE prophylaxis- SCDs, etc  mobilize as tolerated to help  recovery  Nystatin powders under panniculus  45 minutes spent in review, evaluation, examination, counseling, and coordination of care.  More than 50% of that time was spent in counseling.  Adin Hector, MD, FACS, MASCRS Gastrointestinal and Minimally Invasive Surgery    [address], [place], [place] [address] [phone] Main / Paging [phone] Fax   11/03/2018      Past Medical History:  Diagnosis Date   Arthritis    Bilateral lower extremity edema    left lower extremity cellulitis   Common bile duct stone 06/22/2017   Diabetes ([place])    Diverticulosis    Duodenal diverticulum, periampullary 11/03/2018   Aborted ERCP 2016   GERD (gastroesophageal reflux disease)    History of hiatal hernia    Hypertension    Kidney disease, chronic, stage III (GFR 30-59 ml/min) (HCC)    Obesity    Osteopenia    Seizures ([place]) 22 yrs ago   no cause found   Tubular adenoma of colon 2009    Past Surgical History:  Procedure Laterality Date   ABDOMINAL HYSTERECTOMY     APPENDECTOMY     CHOLECYSTECTOMY OPEN  1977  COLONOSCOPY WITH PROPOFOL N/A 10/16/2014   Procedure: COLONOSCOPY WITH PROPOFOL;  Surgeon: [name], MD;  Location: [facility] ENDOSCOPY;  Service: Endoscopy;  Laterality: N/A;   ENDOSCOPIC RETROGRADE CHOLANGIOPANCREATOGRAPHY (ERCP) WITH PROPOFOL N/A 06/02/2017   Procedure: ENDOSCOPIC RETROGRADE CHOLANGIOPANCREATOGRAPHY (ERCP) WITH PROPOFOL;  Surgeon: [name], MD;  Location: [facility] ENDOSCOPY;  Service: Endoscopy;  Laterality: N/A;   TONSILLECTOMY      Social History   Socioeconomic History   Marital status: Married    Spouse name: Not on file   Number of children: Not on file   Years of education: Not on file   Highest education level: Not on file  Occupational History   Not on file  Social Needs   Financial resource strain: Not on file   Food insecurity:    Worry: Not on file    Inability: Not on file    Transportation needs:    Medical: Not on file    Non-medical: Not on file  Tobacco Use   Smoking status: Former Smoker    Packs/day: 1.00    Years: 20.00    Pack years: 20.00    Types: Cigarettes   Smokeless tobacco: Never Used   Tobacco comment: quit smoking 30 yrs ago  Substance and Sexual Activity   Alcohol use: No   Drug use: No   Sexual activity: Not on file  Lifestyle   Physical activity:    Days per week: Not on file    Minutes per session: Not on file   Stress: Not on file  Relationships   Social connections:    Talks on phone: Not on file    Gets together: Not on file    Attends religious service: Not on file    Active member of club or organization: Not on file    Attends meetings of clubs or organizations: Not on file    Relationship status: Not on file   Intimate partner violence:    Fear of current or ex partner: Not on file    Emotionally abused: Not on file    Physically abused: Not on file    Forced sexual activity: Not on file  Other Topics Concern   Not on file  Social History Narrative   Not on file    No family history on file.  Current Facility-Administered Medications  Medication Dose Route Frequency Provider Last Rate Last Dose   0.9 %  sodium chloride infusion   Intravenous PRN Hayden Rasmussen, MD       piperacillin-tazobactam (ZOSYN) IVPB 3.375 g  3.375 g Intravenous Once Hayden Rasmussen, MD       Current Outpatient Medications  Medication Sig Dispense Refill   ibuprofen (ADVIL,MOTRIN) 200 MG tablet Take 400 mg by mouth every 6 (six) hours as needed for headache or moderate pain.     losartan-hydrochlorothiazide (HYZAAR) 50-12.5 MG per tablet Take 1 tablet by mouth every morning.      lovastatin (MEVACOR) 20 MG tablet Take 20 mg by mouth at bedtime.     metFORMIN (GLUCOPHAGE) 500 MG tablet Take 500 mg by mouth 2 (two) times daily.     omeprazole (PRILOSEC OTC) 20 MG tablet Take 20 mg by mouth daily.      pioglitazone (ACTOS) 30 MG tablet Take 30 mg by mouth daily.       Allergies  Allergen Reactions   Levaquin [Levofloxacin In D5w] Nausea And Vomiting    ROS:   All other systems reviewed & are  negative except per HPI or as noted below: Constitutional:  No fevers, chills, sweats.  Weight stable Eyes:  No vision changes, No discharge HENT:  No sore throats, nasal drainage Lymph: No neck swelling, No bruising easily Pulmonary:  No cough, productive sputum CV: No orthopnea, PND  Patient walks 5 minutes with difficulty.  No exertional chest/neck/shoulder/arm pain. GI: No personal nor family history of GI/colon cancer, inflammatory bowel disease, irritable bowel syndrome, allergy such as Celiac Sprue, dietary/dairy problems, colitis, ulcers nor gastritis.  No recent sick contacts/gastroenteritis.  No travel outside the country.  No changes in diet. +polyps Renal: No UTIs, No hematuria Genital:  No drainage, bleeding, masses Musculoskeletal: Does have chronic joint pain but nothing new recently.  Decent ROM major joints Skin:  No sores or lesions.  No rashes Heme/Lymph:  No easy bleeding.  No swollen lymph nodes Neuro: No focal weakness/numbness.  No seizures Psych: No suicidal ideation.  No hallucinations  BP (!) 148/88    Pulse 98    Temp 98 F (36.7 C) (Oral)    Resp (!) 26    Ht 5\' 2"  (1.575 m)    Wt 127 kg    SpO2 (!) 89%    BMI 51.21 kg/m   Physical Exam: General: Pt awake/alert/oriented x4 in no major acute distress Eyes: PERRL, normal EOM. Sclera nonicteric Neuro: CN II-XII intact w/o focal sensory/motor deficits. Lymph: No head/neck/groin lymphadenopathy Psych:  No delerium/psychosis/paranoia HENT: Normocephalic, Mucus membranes moist.  No thrush Neck: Supple, No tracheal deviation Chest: No pain.  Good respiratory excursion. CV:  Pulses intact.  Regular rhythm Abdomen: Morbidly obese but soft.  Right subcostal and Pfannenstiel incisions.  Vague central abdominal discomfort  more than right side.  Moderate-sized panniculus clean with slight malodor underneath but hygiene otherwise okay okay., Nondistended.  No incarcerated hernias. Gen:  No major inguinal hernias.  No inguinal lymphadenopathy.   Ext:  SCDs BLE.  No significant edema.  No cyanosis Skin: No petechiae / purpurea.  No major sores Musculoskeletal: No severe joint pain.  Good ROM major joints   Results:   Labs: Results for orders placed or performed during the hospital encounter of 11/03/18 (from the past 48 hour(s))  CBC with Differential     Status: Abnormal   Collection Time: 11/03/18 11:48 AM  Result Value Ref Range   WBC 11.1 (H) 4.0 - 10.5 K/uL   RBC 4.27 3.87 - 5.11 MIL/uL   Hemoglobin 12.8 12.0 - 15.0 g/dL   HCT 39.6 36.0 - 46.0 %   MCV 92.7 80.0 - 100.0 fL   MCH 30.0 26.0 - 34.0 pg   MCHC 32.3 30.0 - 36.0 g/dL   RDW 14.2 11.5 - 15.5 %   Platelets 196 150 - 400 K/uL   nRBC 0.0 0.0 - 0.2 %   Neutrophils Relative % 79 %   Neutro Abs 8.7 (H) 1.7 - 7.7 K/uL   Lymphocytes Relative 12 %   Lymphs Abs 1.3 0.7 - 4.0 K/uL   Monocytes Relative 9 %   Monocytes Absolute 1.0 0.1 - 1.0 K/uL   Eosinophils Relative 0 %   Eosinophils Absolute 0.1 0.0 - 0.5 K/uL   Basophils Relative 0 %   Basophils Absolute 0.0 0.0 - 0.1 K/uL   Immature Granulocytes 0 %   Abs Immature Granulocytes 0.04 0.00 - 0.07 K/uL    Comment: Performed at [facility], [address] [place], [place] [address]  Comprehensive metabolic panel  Status: Abnormal   Collection Time: 11/03/18 11:48 AM  Result Value Ref Range   Sodium 136 135 - 145 mmol/L   Potassium 4.1 3.5 - 5.1 mmol/L   Chloride 102 98 - 111 mmol/L   CO2 24 22 - 32 mmol/L   Glucose, Bld 109 (H) 70 - 99 mg/dL   BUN 38 (H) 8 - 23 mg/dL   Creatinine, Ser 1.60 (H) 0.44 - 1.00 mg/dL   Calcium 9.0 8.9 - 10.3 mg/dL   Total Protein 7.6 6.5 - 8.1 g/dL   Albumin 3.4 (L) 3.5 - 5.0 g/dL   AST 14 (L) 15 - 41 U/L   ALT 11 0 - 44 U/L   Alkaline  Phosphatase 50 38 - 126 U/L   Total Bilirubin 0.8 0.3 - 1.2 mg/dL   GFR calc non Af Amer 30 (L) >60 mL/min   GFR calc Af Amer 34 (L) >60 mL/min   Anion gap 10 5 - 15    Comment: Performed at [facility], [address] [place], [place] [address]  Lipase, blood     Status: None   Collection Time: 11/03/18 11:48 AM  Result Value Ref Range   Lipase 26 11 - 51 U/L    Comment: Performed at [facility], [address] [place], [place] [address]  Troponin I - Once     Status: None   Collection Time: 11/03/18 11:48 AM  Result Value Ref Range   Troponin I <0.03 <0.03 ng/mL    Comment: Performed at [facility], [address] [place], [place] [address]  Urinalysis, Routine w reflex microscopic     Status: Abnormal   Collection Time: 11/03/18 11:51 AM  Result Value Ref Range   Color, Urine YELLOW YELLOW   APPearance CLEAR CLEAR   Specific Gravity, Urine 1.010 1.005 - 1.030   pH 6.0 5.0 - 8.0   Glucose, UA NEGATIVE NEGATIVE mg/dL   Hgb urine dipstick TRACE (A) NEGATIVE   Bilirubin Urine NEGATIVE NEGATIVE   Ketones, ur NEGATIVE NEGATIVE mg/dL   Protein, ur 30 (A) NEGATIVE mg/dL   Nitrite NEGATIVE NEGATIVE   Leukocytes,Ua NEGATIVE NEGATIVE    Comment: Performed at [facility], [address] [place], [place] [address]  Urinalysis, Microscopic (reflex)     Status: Abnormal   Collection Time: 11/03/18 11:51 AM  Result Value Ref Range   RBC / HPF 0-5 0 - 5 RBC/hpf   WBC, UA NONE SEEN 0 - 5 WBC/hpf   Bacteria, UA RARE (A) NONE SEEN   Squamous Epithelial / LPF 0-5 0 - 5    Comment: Performed at [facility], [address] [place], [place] [address]    Imaging / Studies: Ct Abdomen Pelvis Wo Contrast  Result Date: 11/03/2018 CLINICAL DATA:  Generalized abdominal pain, nausea and vomiting for 2 days. Prior appendectomy, hysterectomy and cholecystectomy. EXAM: CT ABDOMEN AND PELVIS WITHOUT CONTRAST TECHNIQUE: Multidetector CT  imaging of the abdomen and pelvis was performed following the standard protocol without IV contrast. COMPARISON:  [id number] abdominal radiograph. 04/29/2017 CT abdomen/pelvis. FINDINGS: Lower chest: Vague 6 mm nodular opacity at peripheral left lung base (series 4/image 17), not definitely seen on 04/29/2017 CT. Right coronary atherosclerosis. Hepatobiliary: Normal liver size. No liver mass. Cholecystectomy. Bile ducts are stable and within normal post cholecystectomy limits with CBD diameter 7 mm. Moderate to large periampullary duodenal diverticulum, unchanged. Pancreas: Normal, with no mass or duct  dilation. Spleen: Normal size. No mass. Adrenals/Urinary Tract: Normal adrenals. No renal stones. No hydronephrosis. Several simple right renal cysts, largest 2.1 cm in the lateral upper right kidney. No contour deforming left renal masses. Normal bladder. Stomach/Bowel: Normal non-distended stomach. There is a 4.1 cm diameter diverticulum within the midline lower abdomen, which appears to arise from a small bowel loop (series 2/image 69), which demonstrates wall thickening and surrounding fat stranding, compatible with acute small bowel diverticulitis, potentially Meckel's diverticulitis. A few tiny foci surrounding free air indicate microperforation. No associated abscess. Oral contrast transits to the colon. Appendectomy. Moderate left colonic diverticulosis, most prominent in the sigmoid colon, with no large bowel wall thickening or acute pericolonic fat stranding. Vascular/Lymphatic: Atherosclerotic abdominal aorta with ectatic 2.7 cm infrarenal abdominal aorta, not appreciably changed. No pathologically enlarged lymph nodes in the abdomen or pelvis. Reproductive: Status post hysterectomy, with no abnormal findings at the vaginal cuff. No adnexal mass. Other: No ascites. No focal fluid collection. Small fat containing umbilical hernia. Musculoskeletal: No aggressive appearing focal osseous lesions. Marked  thoracolumbar spondylosis. IMPRESSION: 1. Inflamed 4.1 cm diameter diverticulum within the midline lower abdomen appears to arise from a small bowel loop, compatible with acute small bowel diverticulitis, potentially Meckel's diverticulitis. Tiny foci of surrounding free air indicate microperforation. No abscess. 2. Moderate sigmoid diverticulosis, with no evidence of acute sigmoid diverticulitis. 3. Vague 6 mm nodular opacity at the peripheral left lung base. Non-contrast chest CT at 6-12 months is recommended. If the nodule is stable at time of repeat CT, then future CT at 18-24 months (from today's scan) is considered optional for low-risk patients, but is recommended for high-risk patients. This recommendation follows the consensus statement: Guidelines for Management of Incidental Pulmonary Nodules Detected on CT Images:From the Fleischner Society 2017; published online before print (10.1148/radiol.6720947096). 4. Ectatic 2.7 cm infrarenal abdominal aorta, at risk for aneurysm development. Recommend follow-up aortic ultrasound in 5 years. This recommendation follows ACR consensus guidelines: White Paper of the ACR Incidental Findings Committee II on Vascular Findings. J Am Coll Radiol 2013; 28:366-294. 5.  Aortic Atherosclerosis (ICD10-I70.0). Electronically Signed   By: Ilona Sorrel M.D.   On: 11/03/2018 13:53   Dg Chest 1 View  Result Date: 11/03/2018 CLINICAL DATA:  SOB and abnormal finding on CT scan. EXAM: CHEST  1 VIEW COMPARISON:  CT earlier today; radiograph 02/06/2016 FINDINGS: Low volumes with resultant crowding of bronchovascular structures. Ill-defined airspace opacity in the left mid lung. The 6 mm nodule seen at the left lung base on CT earlier today is not conspicuous on low volume portable chest radiography. Heart size upper limits normal for technique. Aortic Atherosclerosis (ICD10-170.0). No effusion. Visualized bones unremarkable. IMPRESSION: Low lung volumes with ill-defined left mid lung  airspace opacity, may represent atelectasis, scar, or infiltrate. Electronically Signed   By: Lucrezia Europe M.D.   On: 11/03/2018 14:33    Medications / Allergies: per chart  Antibiotics: Anti-infectives (From admission, onward)   Start     Dose/Rate Route Frequency Ordered Stop   11/03/18 1445  piperacillin-tazobactam (ZOSYN) IVPB 3.375 g     3.375 g 100 mL/hr over 30 Minutes Intravenous  Once 11/03/18 1443     11/03/18 1430  cefTRIAXone (ROCEPHIN) 2 g in sodium chloride 0.9 % 100 mL IVPB  Status:  Discontinued     2 g 200 mL/hr over 30 Minutes Intravenous  Once 11/03/18 1420 11/03/18 1443   11/03/18 1430  metroNIDAZOLE (FLAGYL) IVPB 500 mg  Status:  Discontinued  500 mg 100 mL/hr over 60 Minutes Intravenous  Once 11/03/18 1420 11/03/18 1443        Note: Portions of this report may have been transcribed using voice recognition software. Every effort was made to ensure accuracy; however, inadvertent computerized transcription errors may be present.   Any transcriptional errors that result from this process are unintentional.    Adin Hector, MD, FACS, MASCRS Gastrointestinal and Minimally Invasive Surgery    [address] [place], [place] [address] [phone] Main / Paging [phone] Fax   11/03/2018

## 2018-11-03 NOTE — ED Provider Notes (Signed)
[facility] EMERGENCY DEPARTMENT Provider Note   CSN: [id number] Arrival date & time: 11/03/18  1010    History   Chief Complaint Chief Complaint  Patient presents with   Abdominal Pain    HPI Pamela [name] is a 84 y.o. female.  She has a history of diabetes CKD.  She presents to the emergency department complaint of abdominal pain that started 2 or 3 days ago.  She said initially it was right-sided but is now more center and upper abdomen.  Is associated with some nausea and one episode of vomiting.  She also has some back pain with it but she has chronic back pain.  She is noticed some increased urinary frequency.  She talked to her PCPs office yesterday and they told her she might be dehydrated.  She had to loose bowel movements yesterday but none today.  No blood from above or below.  No known fever.  No sick contacts or recent travel.  She feels generally weak all over.     The history is provided by the patient.  Abdominal Pain  Pain location:  Epigastric Pain quality: aching   Pain radiates to:  Does not radiate Pain severity:  Moderate Onset quality:  Gradual Duration:  3 days Timing:  Constant Progression:  Waxing and waning Chronicity:  New Context: not recent illness, not recent travel, not sick contacts, not suspicious food intake and not trauma   Relieved by:  Nothing Worsened by:  Nothing Ineffective treatments:  None tried Associated symptoms: diarrhea, dysuria, fatigue, nausea and vomiting   Associated symptoms: no chest pain, no constipation, no cough, no fever, no hematemesis, no hematochezia, no hematuria, no melena, no shortness of breath, no sore throat and no vaginal bleeding   Risk factors: being elderly, multiple surgeries and obesity     Past Medical History:  Diagnosis Date   Arthritis    Bilateral lower extremity edema    left lower extremity cellulitis   Diabetes (HCC)    Diverticulosis    GERD (gastroesophageal reflux  disease)    History of hiatal hernia    Hypertension    Kidney disease, chronic, stage III (GFR 30-59 ml/min) (HCC)    Obesity    Osteopenia    Renal insufficiency    Seizures ([place]) 22 yrs ago   no cause found   Tubular adenoma of colon 2009    Patient Active Problem List   Diagnosis Date Noted   Common bile duct calculi    Elevated LFTs    Abnormal magnetic resonance cholangiopancreatography (MRCP)    RUQ pain    Bloating 11/04/2014   Hx of adenomatous colonic polyps    Bilateral lower extremity edema 09/10/2013   Essential hypertension 09/10/2013   Diabetes ([place]) 09/10/2013    Past Surgical History:  Procedure Laterality Date   ABDOMINAL HYSTERECTOMY     APPENDECTOMY     CHOLECYSTECTOMY     COLONOSCOPY WITH PROPOFOL N/A 10/16/2014   Procedure: COLONOSCOPY WITH PROPOFOL;  Surgeon: [name], MD;  Location: [facility] ENDOSCOPY;  Service: Endoscopy;  Laterality: N/A;   ENDOSCOPIC RETROGRADE CHOLANGIOPANCREATOGRAPHY (ERCP) WITH PROPOFOL N/A 06/02/2017   Procedure: ENDOSCOPIC RETROGRADE CHOLANGIOPANCREATOGRAPHY (ERCP) WITH PROPOFOL;  Surgeon: [name], MD;  Location: [facility] ENDOSCOPY;  Service: Endoscopy;  Laterality: N/A;   TONSILLECTOMY       OB History   No obstetric history on file.      Home Medications    Prior to Admission medications  Medication Sig Start Date End Date Taking? Authorizing Provider  ibuprofen (ADVIL,MOTRIN) 200 MG tablet Take 400 mg by mouth every 6 (six) hours as needed for headache or moderate pain.    [provider]  losartan-hydrochlorothiazide (HYZAAR) 50-12.5 MG per tablet Take 1 tablet by mouth every morning.     [provider]  lovastatin (MEVACOR) 20 MG tablet Take 20 mg by mouth at bedtime.    [provider]  metFORMIN (GLUCOPHAGE) 500 MG tablet Take 500 mg by mouth 2 (two) times daily.    [provider]  omeprazole (PRILOSEC OTC) 20 MG tablet Take 20 mg by mouth daily.     [provider]  pioglitazone (ACTOS) 30 MG tablet Take 30 mg by mouth daily.    [provider]    Family History No family history on file.  Social History Social History   Tobacco Use   Smoking status: Former Smoker    Packs/day: 1.00    Years: 20.00    Pack years: 20.00    Types: Cigarettes   Smokeless tobacco: Never Used   Tobacco comment: quit smoking 30 yrs ago  Substance Use Topics   Alcohol use: No   Drug use: No     Allergies   Levaquin [levofloxacin in d5w]   Review of Systems Review of Systems  Constitutional: Positive for fatigue. Negative for fever.  HENT: Negative for sore throat.   Eyes: Negative for visual disturbance.  Respiratory: Negative for cough and shortness of breath.   Cardiovascular: Negative for chest pain.  Gastrointestinal: Positive for abdominal pain, diarrhea, nausea and vomiting. Negative for constipation, hematemesis, hematochezia and melena.  Genitourinary: Positive for dysuria and frequency. Negative for hematuria and vaginal bleeding.  Musculoskeletal: Positive for back pain.  Skin: Negative for rash.  Neurological: Positive for weakness and headaches.     Physical Exam Updated Vital Signs BP (!) 190/83 (BP Location: Right Arm)    Pulse 99    Temp 98 F (36.7 C) (Oral)    Resp 20    Ht 5\' 2"  (1.575 m)    Wt 127 kg    SpO2 94%    BMI 51.21 kg/m   Physical Exam Vitals signs and nursing note reviewed.  Constitutional:      General: She is not in acute distress.    Appearance: She is well-developed. She is obese.  HENT:     Head: Normocephalic and atraumatic.  Eyes:     Conjunctiva/sclera: Conjunctivae normal.  Neck:     Musculoskeletal: Neck supple.  Cardiovascular:     Rate and Rhythm: Normal rate and regular rhythm.     Heart sounds: No murmur.  Pulmonary:     Effort: Pulmonary effort is normal. No respiratory distress.     Breath sounds: Normal breath sounds.  Abdominal:     Palpations:  Abdomen is soft.     Tenderness: There is no abdominal tenderness. There is no guarding.     Hernia: No hernia is present.  Musculoskeletal: Normal range of motion.        General: No deformity or signs of injury.  Skin:    General: Skin is warm and dry.     Capillary Refill: Capillary refill takes less than 2 seconds.  Neurological:     General: No focal deficit present.     Mental Status: She is alert and oriented to person, place, and time.      ED Treatments / Results  Labs (all labs  ordered are listed, but only abnormal results are displayed) Labs Reviewed  CBC WITH DIFFERENTIAL/PLATELET - Abnormal; Notable for the following components:      Result Value   WBC 11.1 (*)    Neutro Abs 8.7 (*)    All other components within normal limits  COMPREHENSIVE METABOLIC PANEL - Abnormal; Notable for the following components:   Glucose, Bld 109 (*)    BUN 38 (*)    Creatinine, Ser 1.60 (*)    Albumin 3.4 (*)    AST 14 (*)    GFR calc non Af Amer 30 (*)    GFR calc Af Amer 34 (*)    All other components within normal limits  URINALYSIS, ROUTINE W REFLEX MICROSCOPIC - Abnormal; Notable for the following components:   Hgb urine dipstick TRACE (*)    Protein, ur 30 (*)    All other components within normal limits  URINALYSIS, MICROSCOPIC (REFLEX) - Abnormal; Notable for the following components:   Bacteria, UA RARE (*)    All other components within normal limits  SARS CORONAVIRUS 2 (HOSP ORDER, PERFORMED IN [facility] VIA ABBOTT ID)  LIPASE, BLOOD  TROPONIN I  GLUCOSE, CAPILLARY  BASIC METABOLIC PANEL  CBC    EKG EKG Interpretation  Date/Time:  Saturday Nov 03 2018 10:53:04 EDT Ventricular Rate:  86 PR Interval:    QRS Duration: 144 QT Interval:  404 QTC Calculation: 484 R Axis:   -50 Text Interpretation:  Sinus rhythm Left bundle branch block new BBB since prior 10/18 Confirmed by Aletta Edouard [phone]) on 11/03/2018 10:55:41 AM   Radiology Ct Abdomen Pelvis  Wo Contrast  Result Date: 11/03/2018 CLINICAL DATA:  Generalized abdominal pain, nausea and vomiting for 2 days. Prior appendectomy, hysterectomy and cholecystectomy. EXAM: CT ABDOMEN AND PELVIS WITHOUT CONTRAST TECHNIQUE: Multidetector CT imaging of the abdomen and pelvis was performed following the standard protocol without IV contrast. COMPARISON:  [id number] abdominal radiograph. 04/29/2017 CT abdomen/pelvis. FINDINGS: Lower chest: Vague 6 mm nodular opacity at peripheral left lung base (series 4/image 17), not definitely seen on 04/29/2017 CT. Right coronary atherosclerosis. Hepatobiliary: Normal liver size. No liver mass. Cholecystectomy. Bile ducts are stable and within normal post cholecystectomy limits with CBD diameter 7 mm. Moderate to large periampullary duodenal diverticulum, unchanged. Pancreas: Normal, with no mass or duct dilation. Spleen: Normal size. No mass. Adrenals/Urinary Tract: Normal adrenals. No renal stones. No hydronephrosis. Several simple right renal cysts, largest 2.1 cm in the lateral upper right kidney. No contour deforming left renal masses. Normal bladder. Stomach/Bowel: Normal non-distended stomach. There is a 4.1 cm diameter diverticulum within the midline lower abdomen, which appears to arise from a small bowel loop (series 2/image 69), which demonstrates wall thickening and surrounding fat stranding, compatible with acute small bowel diverticulitis, potentially Meckel's diverticulitis. A few tiny foci surrounding free air indicate microperforation. No associated abscess. Oral contrast transits to the colon. Appendectomy. Moderate left colonic diverticulosis, most prominent in the sigmoid colon, with no large bowel wall thickening or acute pericolonic fat stranding. Vascular/Lymphatic: Atherosclerotic abdominal aorta with ectatic 2.7 cm infrarenal abdominal aorta, not appreciably changed. No pathologically enlarged lymph nodes in the abdomen or pelvis. Reproductive: Status post  hysterectomy, with no abnormal findings at the vaginal cuff. No adnexal mass. Other: No ascites. No focal fluid collection. Small fat containing umbilical hernia. Musculoskeletal: No aggressive appearing focal osseous lesions. Marked thoracolumbar spondylosis. IMPRESSION: 1. Inflamed 4.1 cm diameter diverticulum within the midline lower abdomen appears to arise from a small  bowel loop, compatible with acute small bowel diverticulitis, potentially Meckel's diverticulitis. Tiny foci of surrounding free air indicate microperforation. No abscess. 2. Moderate sigmoid diverticulosis, with no evidence of acute sigmoid diverticulitis. 3. Vague 6 mm nodular opacity at the peripheral left lung base. Non-contrast chest CT at 6-12 months is recommended. If the nodule is stable at time of repeat CT, then future CT at 18-24 months (from today's scan) is considered optional for low-risk patients, but is recommended for high-risk patients. This recommendation follows the consensus statement: Guidelines for Management of Incidental Pulmonary Nodules Detected on CT Images:From the Fleischner Society 2017; published online before print (10.1148/radiol.3825053976). 4. Ectatic 2.7 cm infrarenal abdominal aorta, at risk for aneurysm development. Recommend follow-up aortic ultrasound in 5 years. This recommendation follows ACR consensus guidelines: White Paper of the ACR Incidental Findings Committee II on Vascular Findings. J Am Coll Radiol 2013; 73:419-379. 5.  Aortic Atherosclerosis (ICD10-I70.0). Electronically Signed   By: Ilona Sorrel M.D.   On: 11/03/2018 13:53   Dg Chest 1 View  Result Date: 11/03/2018 CLINICAL DATA:  SOB and abnormal finding on CT scan. EXAM: CHEST  1 VIEW COMPARISON:  CT earlier today; radiograph 02/06/2016 FINDINGS: Low volumes with resultant crowding of bronchovascular structures. Ill-defined airspace opacity in the left mid lung. The 6 mm nodule seen at the left lung base on CT earlier today is not  conspicuous on low volume portable chest radiography. Heart size upper limits normal for technique. Aortic Atherosclerosis (ICD10-170.0). No effusion. Visualized bones unremarkable. IMPRESSION: Low lung volumes with ill-defined left mid lung airspace opacity, may represent atelectasis, scar, or infiltrate. Electronically Signed   By: Lucrezia Europe M.D.   On: 11/03/2018 14:33    Procedures Procedures (including critical care time)  Medications Ordered in ED Medications  sodium chloride 0.9 % bolus 1,000 mL (has no administration in time range)     Initial Impression / Assessment and Plan / ED Course  I have reviewed the triage vital signs and the nursing notes.  Pertinent labs & imaging results that were available during my care of the patient were reviewed by me and considered in my medical decision making (see chart for details).  Clinical Course as of Nov 03 1838  Sat Nov 03, 6226  5114 84 year old female with complaint of a few days of abdominal pain along with generalized weakness.  She has multiple prior abdominal surgeries.  Differential diagnosis includes P SBO, gastritis, kidney stone, UTI, viral syndrome, metabolic derangement   [MB]  1411 Patient had a desaturation when transferring from bed to commode.  She said she normally gets short of breath when she does this.  She says maybe this is a little worse than normal.  Staff noted her to be satting in the 72s and so put her on nasal cannula 2 L.  She is feeling back to normal again.  She does not use oxygen at home.  I reviewed the results of her CAT scan with her and have placed a consult into general surgery regarding recommendations.   [MB]  67 Discussed with Dr. gross from general surgery at [facility] long who feels the patient needs a medical admission IV antibiotics bowel rest and surgery will follow along for a possible surgical intervention if she does not improve.   [MB]  0240 Patient updated with plan and agreeable to  admission.  We will send off a Covid screening testing per protocol.   [MB]  1500 Discussed with Dr. Lonny Prude from [facility] long hospitalist  [facility].  She is accepting the patient in transfer for admission.   [MB]    Clinical Course User Index [MB] Hayden Rasmussen, MD   Mckaylah Bettendorf Wolden was evaluated in Emergency Department on 11/03/2018 for the symptoms described in the history of present illness. She was evaluated in the context of the global COVID-19 pandemic, which necessitated consideration that the patient might be at risk for infection with the SARS-CoV-2 virus that causes COVID-19. Institutional protocols and algorithms that pertain to the evaluation of patients at risk for COVID-19 are in a state of rapid change based on information released by regulatory bodies including the CDC and federal and state organizations. These policies and algorithms were followed during the patient's care in the ED.      Final Clinical Impressions(s) / ED Diagnoses   Final diagnoses:  Acute diverticulitis  SOB (shortness of breath) on exertion    ED Discharge Orders    None       Hayden Rasmussen, MD 11/03/18 1840

## 2018-11-03 NOTE — ED Notes (Signed)
Patient transported to CT 

## 2018-11-03 NOTE — H&P (Signed)
History and Physical    Pamela [name] EQA:[id number] DOB: 05/11/1935 DOA: 11/03/2018  PCP: Orpah Melter, MD  Patient coming from: Home  Chief Complaint: Abdominal pain  HPI: Pamela [name] is a 84 y.o. female with medical history significant of CKD stage III, hypertension, diabetes mellitus, type 2, diverticulosis. Symptoms started three days ago with dull right sided abdominal pain that migrated centrally. She took Tylenol which helped a little bit with the pain. She also tried Pepto Bismol for the last two days which did not help with symptoms. She has never had symptoms like this in the past.   ED Course: Vitals: Afebrile, normal pulse, respirations of 20s, hypertensive with BP of 158/79, 100% on 2L Labs: Creatinine of 1.6, WBC of 11.1k Imaging: CT abdomen/pelvis significant for diverticulitis, diverticulosis, 6 mm left lung base nodule, ectatic 2.7 infrarenal abdominal aorta Medications/Course: Zosyn, 2L ns bolus  Review of Systems: Review of Systems  Constitutional: Positive for chills. Negative for diaphoresis, fever and malaise/fatigue.  Respiratory: Positive for shortness of breath (chronic). Negative for cough and wheezing.   Cardiovascular: Negative for chest pain.  Gastrointestinal: Positive for abdominal pain, diarrhea and vomiting. Negative for blood in stool, constipation, melena and nausea.  All other systems reviewed and are negative.   Past Medical History:  Diagnosis Date   Arthritis    Bilateral lower extremity edema    left lower extremity cellulitis   Common bile duct stone 06/22/2017   Diabetes ([place])    Diverticulosis    Duodenal diverticulum, periampullary 11/03/2018   Aborted ERCP 2016   GERD (gastroesophageal reflux disease)    History of hiatal hernia    Hypertension    Kidney disease, chronic, stage III (GFR 30-59 ml/min) (HCC)    Obesity    Osteopenia    Seizures ([place]) 22 yrs ago   no cause found   Tubular adenoma of colon 2009     Past Surgical History:  Procedure Laterality Date   ABDOMINAL HYSTERECTOMY     APPENDECTOMY     CHOLECYSTECTOMY OPEN  1977   COLONOSCOPY WITH PROPOFOL N/A 10/16/2014   Procedure: COLONOSCOPY WITH PROPOFOL;  Surgeon: [name], MD;  Location: [facility] ENDOSCOPY;  Service: Endoscopy;  Laterality: N/A;   ENDOSCOPIC RETROGRADE CHOLANGIOPANCREATOGRAPHY (ERCP) WITH PROPOFOL N/A 06/02/2017   Procedure: ENDOSCOPIC RETROGRADE CHOLANGIOPANCREATOGRAPHY (ERCP) WITH PROPOFOL;  Surgeon: [name], MD;  Location: [facility] ENDOSCOPY;  Service: Endoscopy;  Laterality: N/A;   TONSILLECTOMY       reports that she has quit smoking. Her smoking use included cigarettes. She has a 20.00 pack-year smoking history. She has never used smokeless tobacco. She reports that she does not drink alcohol or use drugs.  Allergies  Allergen Reactions   Levaquin [Levofloxacin In D5w] Nausea And Vomiting    History reviewed. No pertinent family history.  Prior to Admission medications   Medication Sig Start Date End Date Taking? Authorizing Provider  acetaminophen (TYLENOL) 500 MG tablet Take 1,000 mg by mouth every 6 (six) hours as needed for moderate pain.   Yes [provider]  calcium carbonate (OSCAL) 1500 (600 Ca) MG TABS tablet Take 1,500 mg by mouth 2 (two) times daily with a meal.   Yes [provider]  ibuprofen (ADVIL,MOTRIN) 200 MG tablet Take 400 mg by mouth every 6 (six) hours as needed for headache or moderate pain.   Yes [provider]  losartan-hydrochlorothiazide (HYZAAR) 50-12.5 MG per tablet Take 1 tablet by mouth every morning.  Yes [provider]  lovastatin (MEVACOR) 20 MG tablet Take 20 mg by mouth at bedtime.   Yes [provider]  metFORMIN (GLUCOPHAGE) 500 MG tablet Take 500 mg by mouth 2 (two) times daily.   Yes [provider]  Omega-3 Fatty Acids (FISH OIL PO) Take 2 capsules by mouth daily.   Yes [provider]   omeprazole (PRILOSEC) 20 MG capsule Take 20 mg by mouth daily.   Yes [provider]    Physical Exam:  Physical Exam Constitutional:      General: She is not in acute distress.    Appearance: She is well-developed. She is not diaphoretic.  Eyes:     Conjunctiva/sclera: Conjunctivae normal.     Pupils: Pupils are equal, round, and reactive to light.  Neck:     Musculoskeletal: Normal range of motion.  Cardiovascular:     Rate and Rhythm: Normal rate and regular rhythm.     Heart sounds: Normal heart sounds. No murmur.  Pulmonary:     Effort: Pulmonary effort is normal. No respiratory distress.     Breath sounds: Normal breath sounds. No wheezing or rales.  Abdominal:     General: Bowel sounds are normal. There is no distension.     Palpations: Abdomen is soft.     Tenderness: There is no abdominal tenderness. There is no guarding or rebound.  Musculoskeletal: Normal range of motion.        General: No tenderness.  Lymphadenopathy:     Cervical: No cervical adenopathy.  Skin:    General: Skin is warm and dry.  Neurological:     Mental Status: She is alert and oriented to person, place, and time.     Labs on Admission: I have personally reviewed following labs and imaging studies  CBC: Recent Labs  Lab 11/03/18 1148  WBC 11.1*  NEUTROABS 8.7*  HGB 12.8  HCT 39.6  MCV 92.7  PLT 034    Basic Metabolic Panel: Recent Labs  Lab 11/03/18 1148  NA 136  K 4.1  CL 102  CO2 24  GLUCOSE 109*  BUN 38*  CREATININE 1.60*  CALCIUM 9.0    GFR: Estimated Creatinine Clearance: 34.6 mL/min (A) (by C-G formula based on SCr of 1.6 mg/dL (H)).  Liver Function Tests: Recent Labs  Lab 11/03/18 1148  AST 14*  ALT 11  ALKPHOS 50  BILITOT 0.8  PROT 7.6  ALBUMIN 3.4*   Recent Labs  Lab 11/03/18 1148  LIPASE 26   No results for input(s): AMMONIA in the last 168 hours.  Coagulation Profile: No results for input(s): INR, PROTIME in the last 168  hours.  Cardiac Enzymes: Recent Labs  Lab 11/03/18 1148  TROPONINI <0.03    BNP (last 3 results) No results for input(s): PROBNP in the last 8760 hours.  HbA1C: No results for input(s): HGBA1C in the last 72 hours.  CBG: No results for input(s): GLUCAP in the last 168 hours.  Lipid Profile: No results for input(s): CHOL, HDL, LDLCALC, TRIG, CHOLHDL, LDLDIRECT in the last 72 hours.  Thyroid Function Tests: No results for input(s): TSH, T4TOTAL, FREET4, T3FREE, THYROIDAB in the last 72 hours.  Anemia Panel: No results for input(s): VITAMINB12, FOLATE, FERRITIN, TIBC, IRON, RETICCTPCT in the last 72 hours.  Urine analysis:    Component Value Date/Time   COLORURINE YELLOW 11/03/2018 1151   APPEARANCEUR CLEAR 11/03/2018 1151   LABSPEC 1.010 11/03/2018 1151   PHURINE 6.0 11/03/2018 1151   GLUCOSEU  NEGATIVE 11/03/2018 1151   HGBUR TRACE (A) 11/03/2018 [place] 11/03/2018 [place] 11/03/2018 1151   PROTEINUR 30 (A) 11/03/2018 1151   NITRITE NEGATIVE 11/03/2018 [place] 11/03/2018 1151     Radiological Exams on Admission: Ct Abdomen Pelvis Wo Contrast  Result Date: 11/03/2018 CLINICAL DATA:  Generalized abdominal pain, nausea and vomiting for 2 days. Prior appendectomy, hysterectomy and cholecystectomy. EXAM: CT ABDOMEN AND PELVIS WITHOUT CONTRAST TECHNIQUE: Multidetector CT imaging of the abdomen and pelvis was performed following the standard protocol without IV contrast. COMPARISON:  [id number] abdominal radiograph. 04/29/2017 CT abdomen/pelvis. FINDINGS: Lower chest: Vague 6 mm nodular opacity at peripheral left lung base (series 4/image 17), not definitely seen on 04/29/2017 CT. Right coronary atherosclerosis. Hepatobiliary: Normal liver size. No liver mass. Cholecystectomy. Bile ducts are stable and within normal post cholecystectomy limits with CBD diameter 7 mm. Moderate to large periampullary duodenal diverticulum,  unchanged. Pancreas: Normal, with no mass or duct dilation. Spleen: Normal size. No mass. Adrenals/Urinary Tract: Normal adrenals. No renal stones. No hydronephrosis. Several simple right renal cysts, largest 2.1 cm in the lateral upper right kidney. No contour deforming left renal masses. Normal bladder. Stomach/Bowel: Normal non-distended stomach. There is a 4.1 cm diameter diverticulum within the midline lower abdomen, which appears to arise from a small bowel loop (series 2/image 69), which demonstrates wall thickening and surrounding fat stranding, compatible with acute small bowel diverticulitis, potentially Meckel's diverticulitis. A few tiny foci surrounding free air indicate microperforation. No associated abscess. Oral contrast transits to the colon. Appendectomy. Moderate left colonic diverticulosis, most prominent in the sigmoid colon, with no large bowel wall thickening or acute pericolonic fat stranding. Vascular/Lymphatic: Atherosclerotic abdominal aorta with ectatic 2.7 cm infrarenal abdominal aorta, not appreciably changed. No pathologically enlarged lymph nodes in the abdomen or pelvis. Reproductive: Status post hysterectomy, with no abnormal findings at the vaginal cuff. No adnexal mass. Other: No ascites. No focal fluid collection. Small fat containing umbilical hernia. Musculoskeletal: No aggressive appearing focal osseous lesions. Marked thoracolumbar spondylosis. IMPRESSION: 1. Inflamed 4.1 cm diameter diverticulum within the midline lower abdomen appears to arise from a small bowel loop, compatible with acute small bowel diverticulitis, potentially Meckel's diverticulitis. Tiny foci of surrounding free air indicate microperforation. No abscess. 2. Moderate sigmoid diverticulosis, with no evidence of acute sigmoid diverticulitis. 3. Vague 6 mm nodular opacity at the peripheral left lung base. Non-contrast chest CT at 6-12 months is recommended. If the nodule is stable at time of repeat CT,  then future CT at 18-24 months (from today's scan) is considered optional for low-risk patients, but is recommended for high-risk patients. This recommendation follows the consensus statement: Guidelines for Management of Incidental Pulmonary Nodules Detected on CT Images:From the Fleischner Society 2017; published online before print (10.1148/radiol.[id number]). 4. Ectatic 2.7 cm infrarenal abdominal aorta, at risk for aneurysm development. Recommend follow-up aortic ultrasound in 5 years. This recommendation follows ACR consensus guidelines: White Paper of the ACR Incidental Findings Committee II on Vascular Findings. J Am Coll Radiol 2013; 54:650-354. 5.  Aortic Atherosclerosis (ICD10-I70.0). Electronically Signed   By: Ilona Sorrel M.D.   On: 11/03/2018 13:53   Dg Chest 1 View  Result Date: 11/03/2018 CLINICAL DATA:  SOB and abnormal finding on CT scan. EXAM: CHEST  1 VIEW COMPARISON:  CT earlier today; radiograph 02/06/2016 FINDINGS: Low volumes with resultant crowding of bronchovascular structures. Ill-defined airspace opacity in the left mid lung. The 6 mm nodule seen at the  left lung base on CT earlier today is not conspicuous on low volume portable chest radiography. Heart size upper limits normal for technique. Aortic Atherosclerosis (ICD10-170.0). No effusion. Visualized bones unremarkable. IMPRESSION: Low lung volumes with ill-defined left mid lung airspace opacity, may represent atelectasis, scar, or infiltrate. Electronically Signed   By: Lucrezia Europe M.D.   On: 11/03/2018 14:33    EKG: Independently reviewed. LBBB. Sinus rhythm  Assessment/Plan Principal Problem:   Intestine with microperforation (Sigmoid vs Meckel's) Active Problems:   Essential hypertension   Hx of adenomatous colonic polyps   Common bile duct stone   HLD (hyperlipidemia)   CKD (chronic kidney disease) stage 3, GFR 30-59 ml/min (HCC)   Chronic pain   Morbid obesity with BMI of 50.0-59.9, adult (HCC)   Type 2  diabetes mellitus, without long-term current use of insulin (HCC)   Duodenal diverticulum, periampullary   Diverticulosis of sigmoid colon    Diverticulitis with perforation Noted on CT abdomen/pelvis. General surgery consulted and recommends medical management at this time -Ongoing general surgery recommendations -Continue Zosyn  CKD stage III Baseline creatinine of 1.5-1.6. Stable. Avoid nephrotoxic mediations  Essential hypertension Uncontrolled -Restart home losartan-hctz  Diabetes mellitis type 2 Patient on metformin and another medication of which she is unsure. No previous hemoglobin A1C available -SSI  Diverticulosis Chronic. No GI bleeding.  LBBB New from last EKG back in 2018. No current chest pain. -Outpatient follow-up  Morbid obesity Body mass index is 51.21 kg/m.  Incidental findings: -Pulmonary nodule: 6 mm, left lung base. Repeat non-contrast chest CT at 6-12 months -Ectatic infrarenal abdominal aorta: 2.7 cm. Follow-up ultrasound in 5 years.   DVT prophylaxis: Lovenox Code Status: DNR Family Communication: None Disposition Plan: Medical floor Consults called: General surgery Admission status: Inpatient   Cordelia Poche, MD [facility] 11/03/2018, 5:44 PM  If 7PM-7AM, please contact night-coverage [email or web address] Password [email or web address]

## 2018-11-03 NOTE — ED Triage Notes (Signed)
Pt c/o RLQ pain that has now moved to mid-lower abd pain with radiation to umbilicus

## 2018-11-03 NOTE — ED Provider Notes (Signed)
Patient under care of Dr Melina Copa. Difficult IV access, unable to obtain blood work.  Physical Exam  BP (!) 190/83 (BP Location: Right Arm)   Pulse 99   Temp 98 F (36.7 C) (Oral)   Resp 20   Ht 5\' 2"  (1.575 m)   Wt 127 kg   SpO2 94%   BMI 51.21 kg/m   ED Course/Procedures   Clinical Course as of Nov 02 1157  Sat Nov 02, 1773  5933 84 year old female with complaint of a few days of abdominal pain along with generalized weakness.  She has multiple prior abdominal surgeries.  Differential diagnosis includes P SBO, gastritis, kidney stone, UTI, viral syndrome, metabolic derangement   [MB]    Clinical Course User Index [MB] Hayden Rasmussen, MD    Ultrasound ED Peripheral IV (Provider) Date/Time: 11/03/2018 12:00 PM Performed by: Kinnie Feil, PA-C Authorized by: Kinnie Feil, PA-C   Procedure details:    Indications: hydration, multiple failed IV attempts and poor IV access     Skin Prep: chlorhexidine gluconate     Location:  Left AC (left distal medial arm)   Angiocath:  20 G   Bedside Ultrasound Guided: Yes     Images: not archived     Patient tolerated procedure without complications: Yes     Dressing applied: Yes   Comments:     10 cc blood drawn for labs, flushed with 10 cc NS flush without swelling, pain.     MDM   US guided IV in left distal medial arm without immediate complications.       Kinnie Feil, PA-C 11/03/18 1201    Hayden Rasmussen, MD 11/05/18 1340

## 2018-11-03 NOTE — ED Notes (Signed)
Attempted to obtain IV/blood x 2 without success.

## 2018-11-03 NOTE — ED Notes (Signed)
Assisted pt to BR via w/c. 

## 2018-11-03 NOTE — ED Notes (Signed)
Report to Amy, RN at [facility].

## 2018-11-04 DIAGNOSIS — Z6841 Body Mass Index (BMI) 40.0 and over, adult: Secondary | ICD-10-CM

## 2018-11-04 LAB — CBC
HCT: 39.8 % (ref 36.0–46.0)
Hemoglobin: 12.7 g/dL (ref 12.0–15.0)
MCH: 31 pg (ref 26.0–34.0)
MCHC: 31.9 g/dL (ref 30.0–36.0)
MCV: 97.1 fL (ref 80.0–100.0)
Platelets: 193 10*3/uL (ref 150–400)
RBC: 4.1 MIL/uL (ref 3.87–5.11)
RDW: 14.3 % (ref 11.5–15.5)
WBC: 8.6 10*3/uL (ref 4.0–10.5)
nRBC: 0 % (ref 0.0–0.2)

## 2018-11-04 LAB — BASIC METABOLIC PANEL
Anion gap: 11 (ref 5–15)
BUN: 31 mg/dL — ABNORMAL HIGH (ref 8–23)
CO2: 22 mmol/L (ref 22–32)
Calcium: 8.8 mg/dL — ABNORMAL LOW (ref 8.9–10.3)
Chloride: 107 mmol/L (ref 98–111)
Creatinine, Ser: 1.36 mg/dL — ABNORMAL HIGH (ref 0.44–1.00)
GFR calc Af Amer: 42 mL/min — ABNORMAL LOW (ref >60)
GFR calc non Af Amer: 36 mL/min — ABNORMAL LOW (ref >60)
Glucose, Bld: 86 mg/dL (ref 70–99)
Potassium: 3.8 mmol/L (ref 3.5–5.1)
Sodium: 140 mmol/L (ref 135–145)

## 2018-11-04 MED ORDER — ALPRAZOLAM 0.25 MG PO TABS
0.2500 mg | ORAL_TABLET | Freq: Once | ORAL | Status: AC
  Administered 2018-11-04: 0.25 mg via ORAL
  Filled 2018-11-04: qty 1

## 2018-11-04 MED ORDER — ZOLPIDEM TARTRATE 5 MG PO TABS
5.0000 mg | ORAL_TABLET | Freq: Every evening | ORAL | Status: DC | PRN
  Administered 2018-11-04 – 2018-11-06 (×2): 5 mg via ORAL
  Filled 2018-11-04 (×2): qty 1

## 2018-11-04 MED ORDER — TRAZODONE HCL 50 MG PO TABS: 50.0000 mg | ORAL_TABLET | Freq: Every evening | ORAL | Status: DC | PRN

## 2018-11-04 NOTE — Progress Notes (Signed)
PROGRESS NOTE    Pamela [name]  YPP:[id number] DOB: April 20, 1935 DOA: 11/03/2018 PCP: Orpah Melter, MD   Brief Narrative: Pamela [name] is a 84 y.o. female with medical history significant of CKD stage III, hypertension, diabetes mellitus, type 2, diverticulosis. Patient presented with abdominal pain found to have evidence of diverticulitis with perforation.   Assessment & Plan:   Principal Problem:   Intestine with microperforation (Sigmoid vs Meckel's) Active Problems:   Essential hypertension   Hx of adenomatous colonic polyps 2016   Common bile duct stone   HLD (hyperlipidemia)   CKD (chronic kidney disease) stage 3, GFR 30-59 ml/min (HCC)   Chronic pain   Morbid obesity with BMI of 50.0-59.9, adult (HCC)   Type 2 diabetes mellitus, without long-term current use of insulin (HCC)   Duodenal diverticulum, periampullary   Diverticulosis of sigmoid colon   Diverticulitis with perforation Noted on CT abdomen/pelvis. General surgery consulted and recommends medical management at this time -General surgery recommendations: ambulate, NPO, abx -Continue Zosyn  CKD stage III Baseline creatinine of 1.5-1.6. Stable. Avoid nephrotoxic mediations  Essential hypertension Uncontrolled -Continue home losartan-hctz  Diabetes mellitis type 2 Patient on metformin and another medication of which she is unsure. No previous hemoglobin A1C available -SSI  Diverticulosis Chronic. No GI bleeding.  LBBB New from last EKG back in 2018. No current chest pain. -Outpatient follow-up  Morbid obesity Body mass index is 51.21 kg/m.  Diarrhea Will likely not treat secondary to presenting problem. Supportive care for now  Incidental findings: -Pulmonary nodule: 6 mm, left lung base. Repeat non-contrast chest CT at 6-12 months -Ectatic infrarenal abdominal aorta: 2.7 cm. Follow-up ultrasound in 5 years.   DVT prophylaxis: Lovenox Code Status:   Code Status: DNR Family  Communication: None at bedside Disposition Plan: Discharge pending general surgery recommendations and improvement of diverticulitis   Consultants:   General surgery  Procedures:   None  Antimicrobials:  Zosyn (5/2>>    Subjective: No abdominal pain. Frequent diarrhea.  Objective: Vitals:   11/03/18 1600 11/03/18 1713 11/03/18 2103 11/04/18 0432  BP:  (!) 158/79 (!) 177/69 (!) 154/69  Pulse: 89 90 86 85  Resp: (!) 23  18 18   Temp:  97.8 F (36.6 C) 97.6 F (36.4 C) 98.3 F (36.8 C)  TempSrc:  Oral Oral Oral  SpO2: 95% 100% 96% 96%  Weight:      Height:        Intake/Output Summary (Last 24 hours) at 11/04/2018 1019 Last data filed at 11/04/2018 0837 Gross per 24 hour  Intake 1567.45 ml  Output 2602 ml  Net -1034.55 ml   Filed Weights   11/03/18 1030  Weight: 127 kg    Examination:  General exam: Appears calm and comfortable Respiratory system: Clear to auscultation with some mildly diminishes sounds on right. Respiratory effort normal. Cardiovascular system: S1 & S2 heard, RRR. No murmurs, rubs, gallops or clicks. Gastrointestinal system: Abdomen is obese, soft and nontender. Normal bowel sounds heard. Central nervous system: Alert and oriented. No focal neurological deficits. Extremities: No edema. No calf tenderness Skin: No cyanosis. No rashes Psychiatry: Judgement and insight appear normal. Mood & affect appropriate.     Data Reviewed: I have personally reviewed following labs and imaging studies  CBC: Recent Labs  Lab 11/03/18 1148 11/04/18 0353  WBC 11.1* 8.6  NEUTROABS 8.7*  --   HGB 12.8 12.7  HCT 39.6 39.8  MCV 92.7 97.1  PLT 196 458   Basic Metabolic Panel:  Recent Labs  Lab 11/03/18 1148 11/04/18 0353  NA 136 140  K 4.1 3.8  CL 102 107  CO2 24 22  GLUCOSE 109* 86  BUN 38* 31*  CREATININE 1.60* 1.36*  CALCIUM 9.0 8.8*   GFR: Estimated Creatinine Clearance: 40.7 mL/min (A) (by C-G formula based on SCr of 1.36 mg/dL (H)).  Liver Function Tests: Recent Labs  Lab 11/03/18 1148  AST 14*  ALT 11  ALKPHOS 50  BILITOT 0.8  PROT 7.6  ALBUMIN 3.4*   Recent Labs  Lab 11/03/18 1148  LIPASE 26   No results for input(s): AMMONIA in the last 168 hours. Coagulation Profile: No results for input(s): INR, PROTIME in the last 168 hours. Cardiac Enzymes: Recent Labs  Lab 11/03/18 1148  TROPONINI <0.03   BNP (last 3 results) No results for input(s): PROBNP in the last 8760 hours. HbA1C: No results for input(s): HGBA1C in the last 72 hours. CBG: Recent Labs  Lab 11/03/18 1757  GLUCAP 92   Lipid Profile: No results for input(s): CHOL, HDL, LDLCALC, TRIG, CHOLHDL, LDLDIRECT in the last 72 hours. Thyroid Function Tests: No results for input(s): TSH, T4TOTAL, FREET4, T3FREE, THYROIDAB in the last 72 hours. Anemia Panel: No results for input(s): VITAMINB12, FOLATE, FERRITIN, TIBC, IRON, RETICCTPCT in the last 72 hours. Sepsis Labs: No results for input(s): PROCALCITON, LATICACIDVEN in the last 168 hours.  Recent Results (from the past 240 hour(s))  SARS Coronavirus 2 (Hosp order,Performed in [facility] lab via [employer] [facility])     Status: None   Collection Time: 11/03/18  2:41 PM  Result Value Ref Range Status   SARS Coronavirus 2 ([employer] Now) NEGATIVE NEGATIVE Final    Comment: (NOTE) Interpretive Result Comment(s): COVID 19 Positive SARS CoV 2 target nucleic acids are DETECTED. The SARS CoV 2 RNA is generally detectable in upper and lower respiratory specimens during the acute phase of infection.  Positive results are indicative of active infection with SARS CoV 2.  Clinical correlation with patient history and other diagnostic information is necessary to determine patient infection status.  Positive results do not rule out bacterial infection or coinfection with other viruses. The expected result is Negative. COVID 19 Negative SARS CoV 2 target nucleic acids are NOT DETECTED. The SARS CoV 2 RNA  is generally detectable in upper and lower respiratory specimens during the acute phase of infection.  Negative results do not preclude SARS CoV 2 infection, do not rule out coinfections with other pathogens, and should not be used as the sole basis for treatment or other patient management decisions.  Negative results must be combined with clinical  observations, patient history, and epidemiological information. The expected result is Negative. Invalid Presence or absence of SARS CoV 2 nucleic acids cannot be determined. Repeat testing was performed on the submitted specimen and repeated Invalid results were obtained.  If clinically indicated, additional testing on a new specimen with an alternate test methodology [phone]) is advised.  The SARS CoV 2 RNA is generally detectable in upper and lower respiratory specimens during the acute phase of infection. The expected result is Negative. Fact Sheet for Patients:  GolfingFamily.no Fact Sheet for Healthcare Providers: https://www.hernandez-brewer.com/ This test is not yet approved or cleared by the [place] FDA and has been authorized for detection and/or diagnosis of SARS CoV 2 by FDA under an Emergency Use Authorization (EUA).  This EUA will remain in effect (meaning this test can be used) for the duration of the COVID19 d eclaration  under Section 564(b)(1) of the Act, 21 U.S.C. section 4301018473 3(b)(1), unless the authorization is terminated or revoked sooner. Performed at [facility], [address], [place], [place] [address]          Radiology Studies: Ct Abdomen Pelvis Wo Contrast  Result Date: 11/03/2018 CLINICAL DATA:  Generalized abdominal pain, nausea and vomiting for 2 days. Prior appendectomy, hysterectomy and cholecystectomy. EXAM: CT ABDOMEN AND PELVIS WITHOUT CONTRAST TECHNIQUE: Multidetector CT imaging of the abdomen and pelvis was performed following the  standard protocol without IV contrast. COMPARISON:  [id number] abdominal radiograph. 04/29/2017 CT abdomen/pelvis. FINDINGS: Lower chest: Vague 6 mm nodular opacity at peripheral left lung base (series 4/image 17), not definitely seen on 04/29/2017 CT. Right coronary atherosclerosis. Hepatobiliary: Normal liver size. No liver mass. Cholecystectomy. Bile ducts are stable and within normal post cholecystectomy limits with CBD diameter 7 mm. Moderate to large periampullary duodenal diverticulum, unchanged. Pancreas: Normal, with no mass or duct dilation. Spleen: Normal size. No mass. Adrenals/Urinary Tract: Normal adrenals. No renal stones. No hydronephrosis. Several simple right renal cysts, largest 2.1 cm in the lateral upper right kidney. No contour deforming left renal masses. Normal bladder. Stomach/Bowel: Normal non-distended stomach. There is a 4.1 cm diameter diverticulum within the midline lower abdomen, which appears to arise from a small bowel loop (series 2/image 69), which demonstrates wall thickening and surrounding fat stranding, compatible with acute small bowel diverticulitis, potentially Meckel's diverticulitis. A few tiny foci surrounding free air indicate microperforation. No associated abscess. Oral contrast transits to the colon. Appendectomy. Moderate left colonic diverticulosis, most prominent in the sigmoid colon, with no large bowel wall thickening or acute pericolonic fat stranding. Vascular/Lymphatic: Atherosclerotic abdominal aorta with ectatic 2.7 cm infrarenal abdominal aorta, not appreciably changed. No pathologically enlarged lymph nodes in the abdomen or pelvis. Reproductive: Status post hysterectomy, with no abnormal findings at the vaginal cuff. No adnexal mass. Other: No ascites. No focal fluid collection. Small fat containing umbilical hernia. Musculoskeletal: No aggressive appearing focal osseous lesions. Marked thoracolumbar spondylosis. IMPRESSION: 1. Inflamed 4.1 cm diameter  diverticulum within the midline lower abdomen appears to arise from a small bowel loop, compatible with acute small bowel diverticulitis, potentially Meckel's diverticulitis. Tiny foci of surrounding free air indicate microperforation. No abscess. 2. Moderate sigmoid diverticulosis, with no evidence of acute sigmoid diverticulitis. 3. Vague 6 mm nodular opacity at the peripheral left lung base. Non-contrast chest CT at 6-12 months is recommended. If the nodule is stable at time of repeat CT, then future CT at 18-24 months (from today's scan) is considered optional for low-risk patients, but is recommended for high-risk patients. This recommendation follows the consensus statement: Guidelines for Management of Incidental Pulmonary Nodules Detected on CT Images:From the Fleischner Society 2017; published online before print (10.1148/[id number]). 4. Ectatic 2.7 cm infrarenal abdominal aorta, at risk for aneurysm development. Recommend follow-up aortic ultrasound in 5 years. This recommendation follows ACR consensus guidelines: White Paper of the ACR Incidental Findings Committee II on Vascular Findings. J Am Coll Radiol 2013; 47:425-956. 5.  Aortic Atherosclerosis (ICD10-I70.0). Electronically Signed   By: Ilona Sorrel M.D.   On: 11/03/2018 13:53   Dg Chest 1 View  Result Date: 11/03/2018 CLINICAL DATA:  SOB and abnormal finding on CT scan. EXAM: CHEST  1 VIEW COMPARISON:  CT earlier today; radiograph 02/06/2016 FINDINGS: Low volumes with resultant crowding of bronchovascular structures. Ill-defined airspace opacity in the left mid lung. The 6 mm nodule seen at the left lung base on CT  earlier today is not conspicuous on low volume portable chest radiography. Heart size upper limits normal for technique. Aortic Atherosclerosis (ICD10-170.0). No effusion. Visualized bones unremarkable. IMPRESSION: Low lung volumes with ill-defined left mid lung airspace opacity, may represent atelectasis, scar, or infiltrate.  Electronically Signed   By: Lucrezia Europe M.D.   On: 11/03/2018 14:33        Scheduled Meds: . calcium carbonate  1,250 mg Oral BID WC  . enoxaparin (LOVENOX) injection  60 mg Subcutaneous Q24H  . losartan  50 mg Oral Daily   And  . hydrochlorothiazide  12.5 mg Oral Daily  . lip balm  1 application Topical BID  . nystatin   Topical BID  . pravastatin  20 mg Oral q1800   Continuous Infusions: . sodium chloride    . lactated ringers    . lactated ringers    . lactated ringers 75 mL/hr at 11/04/18 0600  . methocarbamol (ROBAXIN) IV    . ondansetron (ZOFRAN) IV    . piperacillin-tazobactam (ZOSYN)  IV 3.375 g (11/04/18 0708)     LOS: 1 day     Cordelia Poche, MD [facility] 11/04/2018, 10:19 AM  If 7PM-7AM, please contact night-coverage [email or web address]

## 2018-11-04 NOTE — Progress Notes (Signed)
Assessment & Plan: HD#2 - Abdominal pain with contained perforation probable small bowel diverticulum  NPO, IV hydration  IV abx  Encouraged OOB, ambulation Diarrhea  Patient reports 3 loose BM's overnight  Will not Rx at present - observe        Pamela Gemma, MD       [facility]       Office: [phone]   Chief Complaint: Abdominal pain  Subjective: Patient seated at bedside, comfortable, denies abd pain this AM.  Loose BM's overnight X 3.  Objective: Vital signs in last 24 hours: Temp:  [97.6 F (36.4 C)-98.3 F (36.8 C)] 98.3 F (36.8 C) (05/03 0432) Pulse Rate:  [85-99] 85 (05/03 0432) Resp:  [17-26] 18 (05/03 0432) BP: (148-190)/(69-88) 154/69 (05/03 0432) SpO2:  [89 %-100 %] 96 % (05/03 0432) Weight:  [294 kg] 127 kg (05/02 1030) Last BM Date: 11/03/18  Intake/Output from previous day: 05/02 0701 - 05/03 0700 In: 1567.5 [I.V.:514.5; IV Piggyback:1053] Out: 2502 [Urine:2500; Stool:2] Intake/Output this shift: No intake/output data recorded.  Physical Exam: HEENT - sclerae clear, mucous membranes moist Neck - soft Abdomen - soft, obese; non-tender; BS present Ext - non-tender Neuro - alert & oriented, no focal deficits  Lab Results:  Recent Labs    11/03/18 1148 11/04/18 0353  WBC 11.1* 8.6  HGB 12.8 12.7  HCT 39.6 39.8  PLT 196 193   BMET Recent Labs    11/03/18 1148 11/04/18 0353  NA 136 140  K 4.1 3.8  CL 102 107  CO2 24 22  GLUCOSE 109* 86  BUN 38* 31*  CREATININE 1.60* 1.36*  CALCIUM 9.0 8.8*   PT/INR No results for input(s): LABPROT, INR in the last 72 hours. Comprehensive Metabolic Panel:    Component Value Date/Time   NA 140 11/04/2018 0353   NA 136 11/03/2018 1148   K 3.8 11/04/2018 0353   K 4.1 11/03/2018 1148   CL 107 11/04/2018 0353   CL 102 11/03/2018 1148   CO2 22 11/04/2018 0353   CO2 24 11/03/2018 1148   BUN 31 (H) 11/04/2018 0353   BUN 38 (H) 11/03/2018 1148   CREATININE 1.36 (H)  11/04/2018 0353   CREATININE 1.60 (H) 11/03/2018 1148   GLUCOSE 86 11/04/2018 0353   GLUCOSE 109 (H) 11/03/2018 1148   CALCIUM 8.8 (L) 11/04/2018 0353   CALCIUM 9.0 11/03/2018 1148   AST 14 (L) 11/03/2018 1148   AST 34 05/16/2017 0921   ALT 11 11/03/2018 1148   ALT 75 (H) 05/16/2017 0921   ALKPHOS 50 11/03/2018 1148   ALKPHOS 208 (H) 05/16/2017 0921   BILITOT 0.8 11/03/2018 1148   BILITOT 1.0 05/16/2017 0921   PROT 7.6 11/03/2018 1148   PROT 7.8 05/16/2017 0921   ALBUMIN 3.4 (L) 11/03/2018 1148   ALBUMIN 3.9 05/16/2017 0921    Studies/Results: Ct Abdomen Pelvis Wo Contrast  Result Date: 11/03/2018 CLINICAL DATA:  Generalized abdominal pain, nausea and vomiting for 2 days. Prior appendectomy, hysterectomy and cholecystectomy. EXAM: CT ABDOMEN AND PELVIS WITHOUT CONTRAST TECHNIQUE: Multidetector CT imaging of the abdomen and pelvis was performed following the standard protocol without IV contrast. COMPARISON:  [id number] abdominal radiograph. 04/29/2017 CT abdomen/pelvis. FINDINGS: Lower chest: Vague 6 mm nodular opacity at peripheral left lung base (series 4/image 17), not definitely seen on 04/29/2017 CT. Right coronary atherosclerosis. Hepatobiliary: Normal liver size. No liver mass. Cholecystectomy. Bile ducts are stable and within normal post cholecystectomy limits with CBD diameter 7  mm. Moderate to large periampullary duodenal diverticulum, unchanged. Pancreas: Normal, with no mass or duct dilation. Spleen: Normal size. No mass. Adrenals/Urinary Tract: Normal adrenals. No renal stones. No hydronephrosis. Several simple right renal cysts, largest 2.1 cm in the lateral upper right kidney. No contour deforming left renal masses. Normal bladder. Stomach/Bowel: Normal non-distended stomach. There is a 4.1 cm diameter diverticulum within the midline lower abdomen, which appears to arise from a small bowel loop (series 2/image 69), which demonstrates wall thickening and surrounding fat stranding,  compatible with acute small bowel diverticulitis, potentially Meckel's diverticulitis. A few tiny foci surrounding free air indicate microperforation. No associated abscess. Oral contrast transits to the colon. Appendectomy. Moderate left colonic diverticulosis, most prominent in the sigmoid colon, with no large bowel wall thickening or acute pericolonic fat stranding. Vascular/Lymphatic: Atherosclerotic abdominal aorta with ectatic 2.7 cm infrarenal abdominal aorta, not appreciably changed. No pathologically enlarged lymph nodes in the abdomen or pelvis. Reproductive: Status post hysterectomy, with no abnormal findings at the vaginal cuff. No adnexal mass. Other: No ascites. No focal fluid collection. Small fat containing umbilical hernia. Musculoskeletal: No aggressive appearing focal osseous lesions. Marked thoracolumbar spondylosis. IMPRESSION: 1. Inflamed 4.1 cm diameter diverticulum within the midline lower abdomen appears to arise from a small bowel loop, compatible with acute small bowel diverticulitis, potentially Meckel's diverticulitis. Tiny foci of surrounding free air indicate microperforation. No abscess. 2. Moderate sigmoid diverticulosis, with no evidence of acute sigmoid diverticulitis. 3. Vague 6 mm nodular opacity at the peripheral left lung base. Non-contrast chest CT at 6-12 months is recommended. If the nodule is stable at time of repeat CT, then future CT at 18-24 months (from today's scan) is considered optional for low-risk patients, but is recommended for high-risk patients. This recommendation follows the consensus statement: Guidelines for Management of Incidental Pulmonary Nodules Detected on CT Images:From the Fleischner Society 2017; published online before print (10.1148/[id number]). 4. Ectatic 2.7 cm infrarenal abdominal aorta, at risk for aneurysm development. Recommend follow-up aortic ultrasound in 5 years. This recommendation follows ACR consensus guidelines: White Paper  of the ACR Incidental Findings Committee II on Vascular Findings. J Am Coll Radiol 2013; 09:326-712. 5.  Aortic Atherosclerosis (ICD10-I70.0). Electronically Signed   By: Pamela [name] M.D.   On: 11/03/2018 13:53   Dg Chest 1 View  Result Date: 11/03/2018 CLINICAL DATA:  SOB and abnormal finding on CT scan. EXAM: CHEST  1 VIEW COMPARISON:  CT earlier today; radiograph 02/06/2016 FINDINGS: Low volumes with resultant crowding of bronchovascular structures. Ill-defined airspace opacity in the left mid lung. The 6 mm nodule seen at the left lung base on CT earlier today is not conspicuous on low volume portable chest radiography. Heart size upper limits normal for technique. Aortic Atherosclerosis (ICD10-170.0). No effusion. Visualized bones unremarkable. IMPRESSION: Low lung volumes with ill-defined left mid lung airspace opacity, may represent atelectasis, scar, or infiltrate. Electronically Signed   By: Lucrezia Europe M.D.   On: 11/03/2018 14:33      Pamela [name] 11/04/2018  Patient ID: Pamela [name], female   DOB: [birth year], 84 y.o.   MRN: [id number]

## 2018-11-05 MED ORDER — DEXTROSE-NACL 5-0.45 % IV SOLN
INTRAVENOUS | Status: DC
  Administered 2018-11-05 – 2018-11-07 (×3): via INTRAVENOUS

## 2018-11-05 MED ORDER — HYDRALAZINE HCL 20 MG/ML IJ SOLN
5.0000 mg | Freq: Four times a day (QID) | INTRAMUSCULAR | Status: DC | PRN
  Administered 2018-11-07: 5 mg via INTRAVENOUS
  Filled 2018-11-05: qty 1

## 2018-11-05 NOTE — Progress Notes (Signed)
[facility]/Trauma Progress Note      Assessment/Plan Principal Problem:   Intestine with microperforation (Sigmoid vs Meckel's) Active Problems:   Essential hypertension   Hx of adenomatous colonic polyps 2016   Common bile duct stone   HLD (hyperlipidemia)   CKD (chronic kidney disease) stage 3, GFR 30-59 ml/min (HCC)   Chronic pain   Morbid obesity with BMI of 50.0-59.9, adult (HCC)   Type 2 diabetes mellitus, without long-term current use of insulin (HCC)   Duodenal diverticulum, periampullary   Diverticulosis of sigmoid colon  Possible small bowel diverticulitis with contained perforation - pain resolved, having diarrhea, no nausea or vomiting - continue IV abx  FEN: CLD VTE: SCD's, lovenox ID: Zosyn 05/02>>    WBC 8.6, afebrile Foley: none Follow up: TBD   DISPO: pt is overall improving, try CLD, no indication for surgery at this time. We will continue to follow. No indication for repeat CT scan unless pt worsens clinically.     LOS: 2 days    Subjective: CC: loose stools  Pt denies abdominal pain, nausea or vomiting. No fever or chills. She is having flatus and loose stools.   Objective: Vital signs in last 24 hours: Temp:  [97.4 F (36.3 C)-97.9 F (36.6 C)] 97.4 F (36.3 C) (05/04 0600) Pulse Rate:  [86-95] 86 (05/04 0600) Resp:  [16-20] 20 (05/04 0600) BP: (152-184)/(74-82) 170/74 (05/04 0600) SpO2:  [91 %-96 %] 91 % (05/04 0600) Last BM Date: 11/04/18  Intake/Output from previous day: 05/03 0701 - 05/04 0700 In: 967.8 [I.V.:768.5; IV Piggyback:199.3] Out: 1501 [Urine:1500; Stool:1] Intake/Output this shift: No intake/output data recorded.  PE: Gen:  Alert, NAD, pleasant, cooperative Pulm:  Rate and effort normal Abd: Soft, obese, NT/ND, +BS Skin: no rashes noted, warm and dry   Anti-infectives: Anti-infectives (From admission, onward)   Start     Dose/Rate Route Frequency Ordered Stop   11/03/18 1530   piperacillin-tazobactam (ZOSYN) IVPB 3.375 g     3.375 g 12.5 mL/hr over 240 Minutes Intravenous Every 8 hours 11/03/18 1524     11/03/18 1445  piperacillin-tazobactam (ZOSYN) IVPB 3.375 g     3.375 g 100 mL/hr over 30 Minutes Intravenous  Once 11/03/18 1443 11/03/18 1545   11/03/18 1430  cefTRIAXone (ROCEPHIN) 2 g in sodium chloride 0.9 % 100 mL IVPB  Status:  Discontinued     2 g 200 mL/hr over 30 Minutes Intravenous  Once 11/03/18 1420 11/03/18 1443   11/03/18 1430  metroNIDAZOLE (FLAGYL) IVPB 500 mg  Status:  Discontinued     500 mg 100 mL/hr over 60 Minutes Intravenous  Once 11/03/18 1420 11/03/18 1443      Lab Results:  Recent Labs    11/03/18 1148 11/04/18 0353  WBC 11.1* 8.6  HGB 12.8 12.7  HCT 39.6 39.8  PLT 196 193   BMET Recent Labs    11/03/18 1148 11/04/18 0353  NA 136 140  K 4.1 3.8  CL 102 107  CO2 24 22  GLUCOSE 109* 86  BUN 38* 31*  CREATININE 1.60* 1.36*  CALCIUM 9.0 8.8*   PT/INR No results for input(s): LABPROT, INR in the last 72 hours. CMP     Component Value Date/Time   NA 140 11/04/2018 0353   K 3.8 11/04/2018 0353   CL 107 11/04/2018 0353   CO2 22 11/04/2018 0353   GLUCOSE 86 11/04/2018 0353   BUN 31 (H) 11/04/2018 0353   CREATININE 1.36 (H) 11/04/2018 8563  CALCIUM 8.8 (L) 11/04/2018 0353   PROT 7.6 11/03/2018 1148   ALBUMIN 3.4 (L) 11/03/2018 1148   AST 14 (L) 11/03/2018 1148   ALT 11 11/03/2018 1148   ALKPHOS 50 11/03/2018 1148   BILITOT 0.8 11/03/2018 1148   GFRNONAA 36 (L) 11/04/2018 0353   GFRAA 42 (L) 11/04/2018 0353   Lipase     Component Value Date/Time   LIPASE 26 11/03/2018 1148    Studies/Results: Ct Abdomen Pelvis Wo Contrast  Result Date: 11/03/2018 CLINICAL DATA:  Generalized abdominal pain, nausea and vomiting for 2 days. Prior appendectomy, hysterectomy and cholecystectomy. EXAM: CT ABDOMEN AND PELVIS WITHOUT CONTRAST TECHNIQUE: Multidetector CT imaging of the abdomen and pelvis was performed following the  standard protocol without IV contrast. COMPARISON:  [id number] abdominal radiograph. 04/29/2017 CT abdomen/pelvis. FINDINGS: Lower chest: Vague 6 mm nodular opacity at peripheral left lung base (series 4/image 17), not definitely seen on 04/29/2017 CT. Right coronary atherosclerosis. Hepatobiliary: Normal liver size. No liver mass. Cholecystectomy. Bile ducts are stable and within normal post cholecystectomy limits with CBD diameter 7 mm. Moderate to large periampullary duodenal diverticulum, unchanged. Pancreas: Normal, with no mass or duct dilation. Spleen: Normal size. No mass. Adrenals/Urinary Tract: Normal adrenals. No renal stones. No hydronephrosis. Several simple right renal cysts, largest 2.1 cm in the lateral upper right kidney. No contour deforming left renal masses. Normal bladder. Stomach/Bowel: Normal non-distended stomach. There is a 4.1 cm diameter diverticulum within the midline lower abdomen, which appears to arise from a small bowel loop (series 2/image 69), which demonstrates wall thickening and surrounding fat stranding, compatible with acute small bowel diverticulitis, potentially Meckel's diverticulitis. A few tiny foci surrounding free air indicate microperforation. No associated abscess. Oral contrast transits to the colon. Appendectomy. Moderate left colonic diverticulosis, most prominent in the sigmoid colon, with no large bowel wall thickening or acute pericolonic fat stranding. Vascular/Lymphatic: Atherosclerotic abdominal aorta with ectatic 2.7 cm infrarenal abdominal aorta, not appreciably changed. No pathologically enlarged lymph nodes in the abdomen or pelvis. Reproductive: Status post hysterectomy, with no abnormal findings at the vaginal cuff. No adnexal mass. Other: No ascites. No focal fluid collection. Small fat containing umbilical hernia. Musculoskeletal: No aggressive appearing focal osseous lesions. Marked thoracolumbar spondylosis. IMPRESSION: 1. Inflamed 4.1 cm diameter  diverticulum within the midline lower abdomen appears to arise from a small bowel loop, compatible with acute small bowel diverticulitis, potentially Meckel's diverticulitis. Tiny foci of surrounding free air indicate microperforation. No abscess. 2. Moderate sigmoid diverticulosis, with no evidence of acute sigmoid diverticulitis. 3. Vague 6 mm nodular opacity at the peripheral left lung base. Non-contrast chest CT at 6-12 months is recommended. If the nodule is stable at time of repeat CT, then future CT at 18-24 months (from today's scan) is considered optional for low-risk patients, but is recommended for high-risk patients. This recommendation follows the consensus statement: Guidelines for Management of Incidental Pulmonary Nodules Detected on CT Images:From the Fleischner Society 2017; published online before print (10.1148/[id number]). 4. Ectatic 2.7 cm infrarenal abdominal aorta, at risk for aneurysm development. Recommend follow-up aortic ultrasound in 5 years. This recommendation follows ACR consensus guidelines: White Paper of the ACR Incidental Findings Committee II on Vascular Findings. J Am Coll Radiol 2013; 66:599-357. 5.  Aortic Atherosclerosis (ICD10-I70.0). Electronically Signed   By: Ilona Sorrel M.D.   On: 11/03/2018 13:53   Dg Chest 1 View  Result Date: 11/03/2018 CLINICAL DATA:  SOB and abnormal finding on CT scan. EXAM: CHEST  1 VIEW COMPARISON:  CT  earlier today; radiograph 02/06/2016 FINDINGS: Low volumes with resultant crowding of bronchovascular structures. Ill-defined airspace opacity in the left mid lung. The 6 mm nodule seen at the left lung base on CT earlier today is not conspicuous on low volume portable chest radiography. Heart size upper limits normal for technique. Aortic Atherosclerosis (ICD10-170.0). No effusion. Visualized bones unremarkable. IMPRESSION: Low lung volumes with ill-defined left mid lung airspace opacity, may represent atelectasis, scar, or infiltrate.  Electronically Signed   By: Lucrezia Europe M.D.   On: 11/03/2018 14:33      Kalman Drape , [facility] 11/05/2018, 9:04 AM  Pager: [phone] Mon-Wed, Friday 7:00am-4:30pm Thurs 7am-11:30am  Consults: [phone]

## 2018-11-05 NOTE — Progress Notes (Signed)
PROGRESS NOTE    Pamela [name]  TMH:[id number] DOB: 1934-08-21 DOA: 11/03/2018 PCP: Orpah Melter, MD   Brief Narrative: Pamela [name] is a 84 y.o. female with medical history significant of CKD stage III, hypertension, diabetes mellitus, type 2, diverticulosis. Patient presented with abdominal pain found to have evidence of diverticulitis with perforation.   Assessment & Plan:   Principal Problem:   Intestine with microperforation (Sigmoid vs Meckel's) Active Problems:   Essential hypertension   Hx of adenomatous colonic polyps 2016   Common bile duct stone   HLD (hyperlipidemia)   CKD (chronic kidney disease) stage 3, GFR 30-59 ml/min (HCC)   Chronic pain   Morbid obesity with BMI of 50.0-59.9, adult (HCC)   Type 2 diabetes mellitus, without long-term current use of insulin (HCC)   Duodenal diverticulum, periampullary   Diverticulosis of sigmoid colon   Diverticulitis with perforation Noted on CT abdomen/pelvis. General surgery consulted and recommends medical management at this time -General surgery recommendations: ambulate, Clear liquid diet, abx -Continue Zosyn  CKD stage III Baseline creatinine of 1.5-1.6. Stable. Avoid nephrotoxic mediations  Essential hypertension Uncontrolled -Continue home losartan-hydrochlorothiazide -Hydralazine IV prn  Diabetes mellitis type 2 Patient on metformin and another medication of which she is unsure. No previous hemoglobin A1C available -SSI  Diverticulosis Chronic. No GI bleeding.  LBBB New from last EKG back in 2018. No current chest pain. -Outpatient follow-up  Morbid obesity Body mass index is 51.21 kg/m.  Diarrhea Will likely not treat secondary to presenting problem. Supportive care for now. Slightly improved.  Incidental findings: -Pulmonary nodule: 6 mm, left lung base. Repeat non-contrast chest CT at 6-12 months -Ectatic infrarenal abdominal aorta: 2.7 cm. Follow-up ultrasound in 5 years.   DVT  prophylaxis: Lovenox Code Status:   Code Status: DNR Family Communication: None at bedside Disposition Plan: Discharge pending general surgery recommendations and improvement of diverticulitis   Consultants:   General surgery  Procedures:   None  Antimicrobials:  Zosyn (5/2>>    Subjective: No abdominal pain. Diarrhea this morning.  Objective: Vitals:   11/04/18 0432 11/04/18 1341 11/04/18 2136 11/05/18 0600  BP: (!) 154/69 (!) 152/82 (!) 184/82 (!) 170/74  Pulse: 85 92 95 86  Resp: 18 16 18 20   Temp: 98.3 F (36.8 C) 97.9 F (36.6 C) 97.8 F (36.6 C) (!) 97.4 F (36.3 C)  TempSrc: Oral Oral Oral Oral  SpO2: 96% 96% 93% 91%  Weight:      Height:        Intake/Output Summary (Last 24 hours) at 11/05/2018 1059 Last data filed at 11/05/2018 1000 Gross per 24 hour  Intake 967.81 ml  Output 1651 ml  Net -683.19 ml   Filed Weights   11/03/18 1030  Weight: 127 kg    Examination:  General exam: Appears calm and comfortable Respiratory system: Clear to auscultation. Respiratory effort normal. Cardiovascular system: S1 & S2 heard, RRR. No murmurs, rubs, gallops or clicks. Gastrointestinal system: Abdomen is nondistended, soft and nontender. No organomegaly or masses felt. Normal bowel sounds heard. Central nervous system: Alert and oriented. No focal neurological deficits. Extremities: No edema. No calf tenderness Skin: No cyanosis. No rashes Psychiatry: Judgement and insight appear normal. Mood & affect appropriate.     Data Reviewed: I have personally reviewed following labs and imaging studies  CBC: Recent Labs  Lab 11/03/18 1148 11/04/18 0353  WBC 11.1* 8.6  NEUTROABS 8.7*  --   HGB 12.8 12.7  HCT 39.6 39.8  MCV 92.7  97.1  PLT 196 993   Basic Metabolic Panel: Recent Labs  Lab 11/03/18 1148 11/04/18 0353  NA 136 140  K 4.1 3.8  CL 102 107  CO2 24 22  GLUCOSE 109* 86  BUN 38* 31*  CREATININE 1.60* 1.36*  CALCIUM 9.0 8.8*   GFR:  Estimated Creatinine Clearance: 40.7 mL/min (A) (by C-G formula based on SCr of 1.36 mg/dL (H)). Liver Function Tests: Recent Labs  Lab 11/03/18 1148  AST 14*  ALT 11  ALKPHOS 50  BILITOT 0.8  PROT 7.6  ALBUMIN 3.4*   Recent Labs  Lab 11/03/18 1148  LIPASE 26   No results for input(s): AMMONIA in the last 168 hours. Coagulation Profile: No results for input(s): INR, PROTIME in the last 168 hours. Cardiac Enzymes: Recent Labs  Lab 11/03/18 1148  TROPONINI <0.03   BNP (last 3 results) No results for input(s): PROBNP in the last 8760 hours. HbA1C: No results for input(s): HGBA1C in the last 72 hours. CBG: Recent Labs  Lab 11/03/18 1757  GLUCAP 92   Lipid Profile: No results for input(s): CHOL, HDL, LDLCALC, TRIG, CHOLHDL, LDLDIRECT in the last 72 hours. Thyroid Function Tests: No results for input(s): TSH, T4TOTAL, FREET4, T3FREE, THYROIDAB in the last 72 hours. Anemia Panel: No results for input(s): VITAMINB12, FOLATE, FERRITIN, TIBC, IRON, RETICCTPCT in the last 72 hours. Sepsis Labs: No results for input(s): PROCALCITON, LATICACIDVEN in the last 168 hours.  Recent Results (from the past 240 hour(s))  SARS Coronavirus 2 (Hosp order,Performed in [facility] lab via [employer] [facility])     Status: None   Collection Time: 11/03/18  2:41 PM  Result Value Ref Range Status   SARS Coronavirus 2 ([employer] Now) NEGATIVE NEGATIVE Final    Comment: (NOTE) Interpretive Result Comment(s): COVID 19 Positive SARS CoV 2 target nucleic acids are DETECTED. The SARS CoV 2 RNA is generally detectable in upper and lower respiratory specimens during the acute phase of infection.  Positive results are indicative of active infection with SARS CoV 2.  Clinical correlation with patient history and other diagnostic information is necessary to determine patient infection status.  Positive results do not rule out bacterial infection or coinfection with other viruses. The expected result is  Negative. COVID 19 Negative SARS CoV 2 target nucleic acids are NOT DETECTED. The SARS CoV 2 RNA is generally detectable in upper and lower respiratory specimens during the acute phase of infection.  Negative results do not preclude SARS CoV 2 infection, do not rule out coinfections with other pathogens, and should not be used as the sole basis for treatment or other patient management decisions.  Negative results must be combined with clinical  observations, patient history, and epidemiological information. The expected result is Negative. Invalid Presence or absence of SARS CoV 2 nucleic acids cannot be determined. Repeat testing was performed on the submitted specimen and repeated Invalid results were obtained.  If clinically indicated, additional testing on a new specimen with an alternate test methodology [phone]) is advised.  The SARS CoV 2 RNA is generally detectable in upper and lower respiratory specimens during the acute phase of infection. The expected result is Negative. Fact Sheet for Patients:  GolfingFamily.no Fact Sheet for Healthcare Providers: https://www.hernandez-brewer.com/ This test is not yet approved or cleared by the [place] FDA and has been authorized for detection and/or diagnosis of SARS CoV 2 by FDA under an Emergency Use Authorization (EUA).  This EUA will remain in effect (meaning this test can  be used) for the duration of the COVID19 d eclaration under Section 564(b)(1) of the Act, 21 U.S.C. section [phone] 3(b)(1), unless the authorization is terminated or revoked sooner. Performed at [facility], [address], [place], [place] [address]          Radiology Studies: Ct Abdomen Pelvis Wo Contrast  Result Date: 11/03/2018 CLINICAL DATA:  Generalized abdominal pain, nausea and vomiting for 2 days. Prior appendectomy, hysterectomy and cholecystectomy. EXAM: CT ABDOMEN AND PELVIS WITHOUT  CONTRAST TECHNIQUE: Multidetector CT imaging of the abdomen and pelvis was performed following the standard protocol without IV contrast. COMPARISON:  [id number] abdominal radiograph. 04/29/2017 CT abdomen/pelvis. FINDINGS: Lower chest: Vague 6 mm nodular opacity at peripheral left lung base (series 4/image 17), not definitely seen on 04/29/2017 CT. Right coronary atherosclerosis. Hepatobiliary: Normal liver size. No liver mass. Cholecystectomy. Bile ducts are stable and within normal post cholecystectomy limits with CBD diameter 7 mm. Moderate to large periampullary duodenal diverticulum, unchanged. Pancreas: Normal, with no mass or duct dilation. Spleen: Normal size. No mass. Adrenals/Urinary Tract: Normal adrenals. No renal stones. No hydronephrosis. Several simple right renal cysts, largest 2.1 cm in the lateral upper right kidney. No contour deforming left renal masses. Normal bladder. Stomach/Bowel: Normal non-distended stomach. There is a 4.1 cm diameter diverticulum within the midline lower abdomen, which appears to arise from a small bowel loop (series 2/image 69), which demonstrates wall thickening and surrounding fat stranding, compatible with acute small bowel diverticulitis, potentially Meckel's diverticulitis. A few tiny foci surrounding free air indicate microperforation. No associated abscess. Oral contrast transits to the colon. Appendectomy. Moderate left colonic diverticulosis, most prominent in the sigmoid colon, with no large bowel wall thickening or acute pericolonic fat stranding. Vascular/Lymphatic: Atherosclerotic abdominal aorta with ectatic 2.7 cm infrarenal abdominal aorta, not appreciably changed. No pathologically enlarged lymph nodes in the abdomen or pelvis. Reproductive: Status post hysterectomy, with no abnormal findings at the vaginal cuff. No adnexal mass. Other: No ascites. No focal fluid collection. Small fat containing umbilical hernia. Musculoskeletal: No aggressive appearing  focal osseous lesions. Marked thoracolumbar spondylosis. IMPRESSION: 1. Inflamed 4.1 cm diameter diverticulum within the midline lower abdomen appears to arise from a small bowel loop, compatible with acute small bowel diverticulitis, potentially Meckel's diverticulitis. Tiny foci of surrounding free air indicate microperforation. No abscess. 2. Moderate sigmoid diverticulosis, with no evidence of acute sigmoid diverticulitis. 3. Vague 6 mm nodular opacity at the peripheral left lung base. Non-contrast chest CT at 6-12 months is recommended. If the nodule is stable at time of repeat CT, then future CT at 18-24 months (from today's scan) is considered optional for low-risk patients, but is recommended for high-risk patients. This recommendation follows the consensus statement: Guidelines for Management of Incidental Pulmonary Nodules Detected on CT Images:From the Fleischner Society 2017; published online before print (10.1148/radiol.[id number]). 4. Ectatic 2.7 cm infrarenal abdominal aorta, at risk for aneurysm development. Recommend follow-up aortic ultrasound in 5 years. This recommendation follows ACR consensus guidelines: White Paper of the ACR Incidental Findings Committee II on Vascular Findings. J Am Coll Radiol 2013; 22:297-989. 5.  Aortic Atherosclerosis (ICD10-I70.0). Electronically Signed   By: Ilona Sorrel M.D.   On: 11/03/2018 13:53   Dg Chest 1 View  Result Date: 11/03/2018 CLINICAL DATA:  SOB and abnormal finding on CT scan. EXAM: CHEST  1 VIEW COMPARISON:  CT earlier today; radiograph 02/06/2016 FINDINGS: Low volumes with resultant crowding of bronchovascular structures. Ill-defined airspace opacity in the left mid lung. The 6  mm nodule seen at the left lung base on CT earlier today is not conspicuous on low volume portable chest radiography. Heart size upper limits normal for technique. Aortic Atherosclerosis (ICD10-170.0). No effusion. Visualized bones unremarkable. IMPRESSION: Low lung volumes  with ill-defined left mid lung airspace opacity, may represent atelectasis, scar, or infiltrate. Electronically Signed   By: Lucrezia Europe M.D.   On: 11/03/2018 14:33        Scheduled Meds: . calcium carbonate  1,250 mg Oral BID WC  . enoxaparin (LOVENOX) injection  60 mg Subcutaneous Q24H  . losartan  50 mg Oral Daily   And  . hydrochlorothiazide  12.5 mg Oral Daily  . lip balm  1 application Topical BID  . nystatin   Topical BID  . pravastatin  20 mg Oral q1800   Continuous Infusions: . sodium chloride Stopped (11/05/18 0514)  . dextrose 5 % and 0.45% NaCl 75 mL/hr at 11/05/18 0904  . lactated ringers    . lactated ringers    . lactated ringers Stopped (11/05/18 0508)  . methocarbamol (ROBAXIN) IV    . ondansetron (ZOFRAN) IV    . piperacillin-tazobactam (ZOSYN)  IV 12.5 mL/hr at 11/05/18 0600     LOS: 2 days     Cordelia Poche, MD [facility] 11/05/2018, 10:59 AM  If 7PM-7AM, please contact night-coverage [email or web address]

## 2018-11-06 ENCOUNTER — Inpatient Hospital Stay (HOSPITAL_COMMUNITY): Payer: Medicare HMO

## 2018-11-06 DIAGNOSIS — M25561 Pain in right knee: Secondary | ICD-10-CM

## 2018-11-06 LAB — BASIC METABOLIC PANEL
Anion gap: 13 (ref 5–15)
BUN: 34 mg/dL — ABNORMAL HIGH (ref 8–23)
CO2: 21 mmol/L — ABNORMAL LOW (ref 22–32)
Calcium: 9.1 mg/dL (ref 8.9–10.3)
Chloride: 105 mmol/L (ref 98–111)
Creatinine, Ser: 1.73 mg/dL — ABNORMAL HIGH (ref 0.44–1.00)
GFR calc Af Amer: 31 mL/min — ABNORMAL LOW (ref >60)
GFR calc non Af Amer: 27 mL/min — ABNORMAL LOW (ref >60)
Glucose, Bld: 115 mg/dL — ABNORMAL HIGH (ref 70–99)
Potassium: 3.7 mmol/L (ref 3.5–5.1)
Sodium: 139 mmol/L (ref 135–145)

## 2018-11-06 LAB — CBC
HCT: 40.1 % (ref 36.0–46.0)
Hemoglobin: 13.1 g/dL (ref 12.0–15.0)
MCH: 30.8 pg (ref 26.0–34.0)
MCHC: 32.7 g/dL (ref 30.0–36.0)
MCV: 94.1 fL (ref 80.0–100.0)
Platelets: 189 10*3/uL (ref 150–400)
RBC: 4.26 MIL/uL (ref 3.87–5.11)
RDW: 13.9 % (ref 11.5–15.5)
WBC: 9.4 10*3/uL (ref 4.0–10.5)
nRBC: 0 % (ref 0.0–0.2)

## 2018-11-06 LAB — GLUCOSE, CAPILLARY: Glucose-Capillary: 93 mg/dL (ref 70–99)

## 2018-11-06 LAB — C DIFFICILE QUICK SCREEN W PCR REFLEX
C Diff antigen: NEGATIVE
C Diff interpretation: NOT DETECTED
C Diff toxin: NEGATIVE

## 2018-11-06 MED ORDER — DICLOFENAC SODIUM 1 % TD GEL
4.0000 g | Freq: Four times a day (QID) | TRANSDERMAL | Status: DC
  Administered 2018-11-06 – 2018-11-07 (×4): 4 g via TOPICAL
  Filled 2018-11-06: qty 100

## 2018-11-06 NOTE — Progress Notes (Signed)
Pamela [name]. Reidel JNW:[id number] DOB: Jun 07, 1935 DOA: 11/03/2018  Patient with diarrhea, x6 stool today, denies nausea, vomiting and abdominal pain. On call physician for [facility] notified. New order obtained.

## 2018-11-06 NOTE — Progress Notes (Signed)
[facility]/Trauma Progress Note      Assessment/Plan Principal Problem:   Intestine with microperforation (Sigmoid vs Meckel's) Active Problems:   Essential hypertension   Hx of adenomatous colonic polyps 2016   Common bile duct stone   HLD (hyperlipidemia)   CKD (chronic kidney disease) stage 3, GFR 30-59 ml/min (HCC)   Chronic pain   Morbid obesity with BMI of 50.0-59.9, adult (HCC)   Type 2 diabetes mellitus, without long-term current use of insulin (HCC)   Duodenal diverticulum, periampullary   Diverticulosis of sigmoid colon  Possible small bowel diverticulitis with contained perforation - pain resolved, having diarrhea, no nausea or vomiting - continue IV abx - c diff neg - advance diet to FLD and to soft as tolerated   FEN: FLD VTE: SCD's, lovenox ID: Zosyn 05/02>>    WBC 9.4, afebrile Foley: none Follow up: TBD   DISPO: FLD, no indication for surgery at this time. We will continue to follow. No indication for repeat CT scan unless pt worsens clinically.    LOS: 3 days    Subjective: CC: diarrhea overnight  Pt states she didn't sleep at all last night from diarrhea. No abdominal pain, nausea or vomiting. No fever or chills. She is complaining of new onset b/l knee pain that is affecting her ability to ambulate well. She states hx of intermittent knee pain she states she blames on "old age".   Objective: Vital signs in last 24 hours: Temp:  [97.6 F (36.4 C)-97.8 F (36.6 C)] 97.8 F (36.6 C) (05/05 0429) Pulse Rate:  [79-86] 86 (05/05 0429) Resp:  [16-20] 16 (05/05 0429) BP: (159-197)/(76-82) 159/79 (05/05 0429) SpO2:  [91 %-95 %] 91 % (05/05 0429) Last BM Date: 11/04/18  Intake/Output from previous day: 05/04 0701 - 05/05 0700 In: 1908.5 [P.O.:720; I.V.:1055.2; IV Piggyback:133.3] Out: 900 [Urine:900] Intake/Output this shift: No intake/output data recorded.  PE: Gen:  Alert, NAD, pleasant, cooperative Pulm:  Rate and effort  normal Abd: Soft, obese, NT/ND, +BS Extremities: no edema noted, difficult to assess knee joints due to body habitus but pt did state TTP with palpation of knees, no redness or increased warmth noted to b/l knees  Skin: no rashes noted, warm and dry   Anti-infectives: Anti-infectives (From admission, onward)   Start     Dose/Rate Route Frequency Ordered Stop   11/03/18 1530  piperacillin-tazobactam (ZOSYN) IVPB 3.375 g     3.375 g 12.5 mL/hr over 240 Minutes Intravenous Every 8 hours 11/03/18 1524     11/03/18 1445  piperacillin-tazobactam (ZOSYN) IVPB 3.375 g     3.375 g 100 mL/hr over 30 Minutes Intravenous  Once 11/03/18 1443 11/03/18 1545   11/03/18 1430  cefTRIAXone (ROCEPHIN) 2 g in sodium chloride 0.9 % 100 mL IVPB  Status:  Discontinued     2 g 200 mL/hr over 30 Minutes Intravenous  Once 11/03/18 1420 11/03/18 1443   11/03/18 1430  metroNIDAZOLE (FLAGYL) IVPB 500 mg  Status:  Discontinued     500 mg 100 mL/hr over 60 Minutes Intravenous  Once 11/03/18 1420 11/03/18 1443      Lab Results:  Recent Labs    11/04/18 0353 11/06/18 0341  WBC 8.6 9.4  HGB 12.7 13.1  HCT 39.8 40.1  PLT 193 189   BMET Recent Labs    11/04/18 0353 11/06/18 0341  NA 140 139  K 3.8 3.7  CL 107 105  CO2 22 21*  GLUCOSE 86 115*  BUN 31* 34*  CREATININE 1.36* 1.73*  CALCIUM 8.8* 9.1   PT/INR No results for input(s): LABPROT, INR in the last 72 hours. CMP     Component Value Date/Time   NA 139 11/06/2018 0341   K 3.7 11/06/2018 0341   CL 105 11/06/2018 0341   CO2 21 (L) 11/06/2018 0341   GLUCOSE 115 (H) 11/06/2018 0341   BUN 34 (H) 11/06/2018 0341   CREATININE 1.73 (H) 11/06/2018 0341   CALCIUM 9.1 11/06/2018 0341   PROT 7.6 11/03/2018 1148   ALBUMIN 3.4 (L) 11/03/2018 1148   AST 14 (L) 11/03/2018 1148   ALT 11 11/03/2018 1148   ALKPHOS 50 11/03/2018 1148   BILITOT 0.8 11/03/2018 1148   GFRNONAA 27 (L) 11/06/2018 0341   GFRAA 31 (L) 11/06/2018 0341   Lipase      Component Value Date/Time   LIPASE 26 11/03/2018 1148    Studies/Results: No results found.    Kalman Drape , [facility] Surgery 11/06/2018, 8:37 AM  Pager: [phone] Mon-Wed, Friday 7:00am-4:30pm Thurs 7am-11:30am  Consults: [phone]

## 2018-11-06 NOTE — Care Management Important Message (Signed)
Important Message  Patient Details IM Letter given to Servando Snare SW to present to the Patient Name: Pamela [name] MRN: [id number] Date of Birth: 10-Nov-1934   Medicare Important Message Given:  Yes    Kerin Salen 11/06/2018, 10:27 AM

## 2018-11-06 NOTE — Care Management Important Message (Signed)
Important Message  Patient Details  Name: Pamela [name] MRN: [id number] Date of Birth: Sep 10, 1934   Medicare Important Message Given:  Yes    Kerin Salen 11/06/2018, 10:27 AMImportant Message  Patient Details  Name: Pamela [name] MRN: [id number] Date of Birth: 03-Dec-1934   Medicare Important Message Given:  Yes    Kerin Salen 11/06/2018, 10:26 AM

## 2018-11-06 NOTE — Progress Notes (Signed)
PROGRESS NOTE    Pamela [name]  Pamela [name] DOB: [birth year] DOA: 11/03/2018 PCP: Orpah Melter, MD   Brief Narrative: Pamela [name] is a 84 y.o. female with medical history significant of CKD stage III, hypertension, diabetes mellitus, type 2, diverticulosis. Patient presented with abdominal pain found to have evidence of diverticulitis with perforation.   Assessment & Plan:   Principal Problem:   Intestine with microperforation (Sigmoid vs Meckel's) Active Problems:   Essential hypertension   Hx of adenomatous colonic polyps 2016   Common bile duct stone   HLD (hyperlipidemia)   CKD (chronic kidney disease) stage 3, GFR 30-59 ml/min (HCC)   Chronic pain   Morbid obesity with BMI of 50.0-59.9, adult (HCC)   Type 2 diabetes mellitus, without long-term current use of insulin (HCC)   Duodenal diverticulum, periampullary   Diverticulosis of sigmoid colon   Diverticulitis with perforation Noted on CT abdomen/pelvis. General surgery consulted and recommends medical management at this time -General surgery recommendations: ambulate, Full liquid, IV abx -Continue Zosyn  CKD stage III Baseline creatinine of 1.5-1.6. Stable. Initially lower, but trended back up to baseline. On losartan and hydrochlorothiazide for now -BMP in AM  Essential hypertension Uncontrolled -Continue home losartan-hydrochlorothiazide -Hydralazine IV prn  Diabetes mellitis type 2 Patient on metformin and another medication of which she is unsure. No previous hemoglobin A1C available -SSI  Diverticulosis Chronic. No GI bleeding.  LBBB New from last EKG back in 2018. No current chest pain. -Outpatient follow-up  Morbid obesity Body mass index is 51.21 kg/m.  Diarrhea Will likely not treat secondary to presenting problem. Supportive care for now. Slightly improved.  Right knee pain Unknown etiology -Right knee x-ray  Incidental findings: -Pulmonary nodule: 6 mm, left lung base.  Repeat non-contrast chest CT at 6-12 months -Ectatic infrarenal abdominal aorta: 2.7 cm. Follow-up ultrasound in 5 years.   DVT prophylaxis: Lovenox Code Status:   Code Status: DNR Family Communication: None at bedside Disposition Plan: Discharge pending general surgery recommendations and improvement of diverticulitis   Consultants:   General surgery  Procedures:   None  Antimicrobials:  Zosyn (5/2>>    Subjective: Right knee pain is new since yesterday. Norco helping with pain. Persistent since yesterday and keeping her from ambulating well. No eliciting factors.  Objective: Vitals:   11/05/18 0600 11/05/18 1411 11/05/18 2037 11/06/18 0429  BP: (!) 170/74 (!) 197/76 (!) 172/82 (!) 159/79  Pulse: 86 79 86 86  Resp: 20 18 20 16   Temp: (!) 97.4 F (36.3 C) 97.6 F (36.4 C) 97.7 F (36.5 C) 97.8 F (36.6 C)  TempSrc: Oral Oral Oral Oral  SpO2: 91% 95% 92% 91%  Weight:      Height:        Intake/Output Summary (Last 24 hours) at 11/06/2018 1232 Last data filed at 11/06/2018 1227 Gross per 24 hour  Intake 2692.91 ml  Output 1050 ml  Net 1642.91 ml   Filed Weights   11/03/18 1030  Weight: 127 kg    Examination:  General exam: Appears calm and comfortable Respiratory system: Clear to auscultation. Respiratory effort normal. Cardiovascular system: S1 & S2 heard, RRR. No murmurs, rubs, gallops or clicks. Gastrointestinal system: Abdomen is nondistended, soft and nontender. No organomegaly or masses felt. Normal bowel sounds heard. Central nervous system: Alert and oriented. No focal neurological deficits. Extremities: No edema. No calf tenderness. Right knee without obvious effusion, tenderness over tibial plateau and along medial joint line. No patellar tenderness. No overlying erythema. Skin:  No cyanosis. No rashes Psychiatry: Judgement and insight appear normal. Mood & affect appropriate.     Data Reviewed: I have personally reviewed following labs and  imaging studies  CBC: Recent Labs  Lab 11/03/18 1148 11/04/18 0353 11/06/18 0341  WBC 11.1* 8.6 9.4  NEUTROABS 8.7*  --   --   HGB 12.8 12.7 13.1  HCT 39.6 39.8 40.1  MCV 92.7 97.1 94.1  PLT 196 193 536   Basic Metabolic Panel: Recent Labs  Lab 11/03/18 1148 11/04/18 0353 11/06/18 0341  NA 136 140 139  K 4.1 3.8 3.7  CL 102 107 105  CO2 24 22 21*  GLUCOSE 109* 86 115*  BUN 38* 31* 34*  CREATININE 1.60* 1.36* 1.73*  CALCIUM 9.0 8.8* 9.1   GFR: Estimated Creatinine Clearance: 32 mL/min (A) (by C-G formula based on SCr of 1.73 mg/dL (H)). Liver Function Tests: Recent Labs  Lab 11/03/18 1148  AST 14*  ALT 11  ALKPHOS 50  BILITOT 0.8  PROT 7.6  ALBUMIN 3.4*   Recent Labs  Lab 11/03/18 1148  LIPASE 26   No results for input(s): AMMONIA in the last 168 hours. Coagulation Profile: No results for input(s): INR, PROTIME in the last 168 hours. Cardiac Enzymes: Recent Labs  Lab 11/03/18 1148  TROPONINI <0.03   BNP (last 3 results) No results for input(s): PROBNP in the last 8760 hours. HbA1C: No results for input(s): HGBA1C in the last 72 hours. CBG: Recent Labs  Lab 11/03/18 1637 11/03/18 1757  GLUCAP 93 92   Lipid Profile: No results for input(s): CHOL, HDL, LDLCALC, TRIG, CHOLHDL, LDLDIRECT in the last 72 hours. Thyroid Function Tests: No results for input(s): TSH, T4TOTAL, FREET4, T3FREE, THYROIDAB in the last 72 hours. Anemia Panel: No results for input(s): VITAMINB12, FOLATE, FERRITIN, TIBC, IRON, RETICCTPCT in the last 72 hours. Sepsis Labs: No results for input(s): PROCALCITON, LATICACIDVEN in the last 168 hours.  Recent Results (from the past 240 hour(s))  SARS Coronavirus 2 (Hosp order,Performed in [facility] lab via [employer])     Status: None   Collection Time: 11/03/18  2:41 PM  Result Value Ref Range Status   SARS Coronavirus 2 ([employer] Now) NEGATIVE NEGATIVE Final    Comment: (NOTE) Interpretive Result Comment(s): COVID 19  Positive SARS CoV 2 target nucleic acids are DETECTED. The SARS CoV 2 RNA is generally detectable in upper and lower respiratory specimens during the acute phase of infection.  Positive results are indicative of active infection with SARS CoV 2.  Clinical correlation with patient history and other diagnostic information is necessary to determine patient infection status.  Positive results do not rule out bacterial infection or coinfection with other viruses. The expected result is Negative. COVID 19 Negative SARS CoV 2 target nucleic acids are NOT DETECTED. The SARS CoV 2 RNA is generally detectable in upper and lower respiratory specimens during the acute phase of infection.  Negative results do not preclude SARS CoV 2 infection, do not rule out coinfections with other pathogens, and should not be used as the sole basis for treatment or other patient management decisions.  Negative results must be combined with clinical  observations, patient history, and epidemiological information. The expected result is Negative. Invalid Presence or absence of SARS CoV 2 nucleic acids cannot be determined. Repeat testing was performed on the submitted specimen and repeated Invalid results were obtained.  If clinically indicated, additional testing on a new specimen with an alternate test methodology [id number] is  advised.  The SARS CoV 2 RNA is generally detectable in upper and lower respiratory specimens during the acute phase of infection. The expected result is Negative. Fact Sheet for Patients:  GolfingFamily.no Fact Sheet for Healthcare Providers: https://www.hernandez-brewer.com/ This test is not yet approved or cleared by the [place] FDA and has been authorized for detection and/or diagnosis of SARS CoV 2 by FDA under an Emergency Use Authorization (EUA).  This EUA will remain in effect (meaning this test can be used) for the duration of the  COVID19 d eclaration under Section 564(b)(1) of the Act, 21 U.S.C. section [phone] 3(b)(1), unless the authorization is terminated or revoked sooner. Performed at [facility], [address] [place], [place] [address]   C difficile quick scan w PCR reflex     Status: None   Collection Time: 11/06/18 12:59 AM  Result Value Ref Range Status   C Diff antigen NEGATIVE NEGATIVE Final   C Diff toxin NEGATIVE NEGATIVE Final   C Diff interpretation No C. difficile detected.  Final    Comment: Performed at [facility], [place] [address] [place], [place] [address]         Radiology Studies: No results found.      Scheduled Meds: . calcium carbonate  1,250 mg Oral BID WC  . enoxaparin (LOVENOX) injection  60 mg Subcutaneous Q24H  . losartan  50 mg Oral Daily   And  . hydrochlorothiazide  12.5 mg Oral Daily  . lip balm  1 application Topical BID  . nystatin   Topical BID  . pravastatin  20 mg Oral q1800   Continuous Infusions: . sodium chloride Stopped (11/05/18 0514)  . dextrose 5 % and 0.45% NaCl 75 mL/hr at 11/06/18 0812  . lactated ringers    . lactated ringers Stopped (11/05/18 0508)  . methocarbamol (ROBAXIN) IV    . ondansetron (ZOFRAN) IV    . piperacillin-tazobactam (ZOSYN)  IV 3.375 g (11/05/18 2118)     LOS: 3 days     Cordelia Poche, MD [facility] 11/06/2018, 12:32 PM  If 7PM-7AM, please contact night-coverage [email or web address]

## 2018-11-07 DIAGNOSIS — G8929 Other chronic pain: Secondary | ICD-10-CM

## 2018-11-07 DIAGNOSIS — K5710 Diverticulosis of small intestine without perforation or abscess without bleeding: Secondary | ICD-10-CM

## 2018-11-07 DIAGNOSIS — E1169 Type 2 diabetes mellitus with other specified complication: Secondary | ICD-10-CM

## 2018-11-07 LAB — CBC
HCT: 38.1 % (ref 36.0–46.0)
Hemoglobin: 12.4 g/dL (ref 12.0–15.0)
MCH: 30.7 pg (ref 26.0–34.0)
MCHC: 32.5 g/dL (ref 30.0–36.0)
MCV: 94.3 fL (ref 80.0–100.0)
Platelets: 203 10*3/uL (ref 150–400)
RBC: 4.04 MIL/uL (ref 3.87–5.11)
RDW: 13.8 % (ref 11.5–15.5)
WBC: 7.6 10*3/uL (ref 4.0–10.5)
nRBC: 0 % (ref 0.0–0.2)

## 2018-11-07 LAB — BASIC METABOLIC PANEL
Anion gap: 11 (ref 5–15)
BUN: 31 mg/dL — ABNORMAL HIGH (ref 8–23)
CO2: 22 mmol/L (ref 22–32)
Calcium: 8.8 mg/dL — ABNORMAL LOW (ref 8.9–10.3)
Chloride: 106 mmol/L (ref 98–111)
Creatinine, Ser: 1.56 mg/dL — ABNORMAL HIGH (ref 0.44–1.00)
GFR calc Af Amer: 35 mL/min — ABNORMAL LOW (ref >60)
GFR calc non Af Amer: 31 mL/min — ABNORMAL LOW (ref >60)
Glucose, Bld: 113 mg/dL — ABNORMAL HIGH (ref 70–99)
Potassium: 3.3 mmol/L — ABNORMAL LOW (ref 3.5–5.1)
Sodium: 139 mmol/L (ref 135–145)

## 2018-11-07 LAB — MAGNESIUM: Magnesium: 1.7 mg/dL (ref 1.7–2.4)

## 2018-11-07 LAB — PHOSPHORUS: Phosphorus: 3.2 mg/dL (ref 2.5–4.6)

## 2018-11-07 MED ORDER — DICLOFENAC SODIUM 1 % TD GEL: 4.0000 g | Freq: Four times a day (QID) | TRANSDERMAL | 0 refills | Status: DC

## 2018-11-07 MED ORDER — POTASSIUM CHLORIDE CRYS ER 20 MEQ PO TBCR
40.0000 meq | EXTENDED_RELEASE_TABLET | Freq: Two times a day (BID) | ORAL | Status: DC
  Administered 2018-11-07: 40 meq via ORAL
  Filled 2018-11-07: qty 2

## 2018-11-07 MED ORDER — AMOXICILLIN-POT CLAVULANATE 875-125 MG PO TABS: 1.0000 | ORAL_TABLET | Freq: Two times a day (BID) | ORAL | 0 refills | Status: AC

## 2018-11-07 MED ORDER — NYSTATIN 100000 UNIT/GM EX POWD: Freq: Two times a day (BID) | CUTANEOUS | 0 refills | Status: DC

## 2018-11-07 MED ORDER — ACETAMINOPHEN 500 MG PO TABS: 500.0000 mg | ORAL_TABLET | Freq: Four times a day (QID) | ORAL | 0 refills | Status: DC | PRN

## 2018-11-07 MED ORDER — DICLOFENAC SODIUM 1 % TD GEL: 4.0000 g | Freq: Four times a day (QID) | TRANSDERMAL | Status: DC

## 2018-11-07 MED ORDER — HYDROCODONE-ACETAMINOPHEN 5-325 MG PO TABS: 1.0000 | ORAL_TABLET | ORAL | 0 refills | Status: DC | PRN

## 2018-11-07 MED ORDER — LIP MEDEX EX OINT: 1.0000 "application " | TOPICAL_OINTMENT | Freq: Two times a day (BID) | CUTANEOUS | 0 refills | Status: DC

## 2018-11-07 MED ORDER — AMOXICILLIN-POT CLAVULANATE 875-125 MG PO TABS
1.0000 | ORAL_TABLET | Freq: Two times a day (BID) | ORAL | Status: DC
  Administered 2018-11-07: 1 via ORAL
  Filled 2018-11-07: qty 1

## 2018-11-07 NOTE — Discharge Summary (Signed)
Physician Discharge Summary  [name]:[id number] DOB: [birth year] DOA: 11/03/2018  PCP: Orpah Melter, MD  Admit date: 11/03/2018 Discharge date: 11/07/2018  Admitted From: Home Disposition: Home  Recommendations for Outpatient Follow-up:  1. Follow up with PCP in 1-2 weeks 2. Follow up with General Surgery in 2 weeks 3. Follow up with Cardiology as an outpatient 4. If necessary follow up with Orthopedics for Right Knee 5. Please obtain CMP/CBC, Mag, Phos in one week 6. Please follow up on the following pending results:  Home Health: No Equipment/Devices: None    Discharge Condition: Stable CODE STATUS: DO NOT RESUSCITATE Diet recommendation: Soft Heart Healthy Carb Modified Diet  Brief/Interim Summary: Pamela [name] is a 84 y.o. femalewith medical history significant ofCKD stage III, hypertension, diabetes mellitus, type 2, diverticulosis. Patient presented with abdominal pain found to have evidence of diverticulitis with perforation.  She is placed on antibiotic coverage and improved and is able to tolerate diet without issues.  General surgery evaluated there is no indication for surgery at this time and her pain resolved with no nausea or vomiting.  She did have some diarrhea but she was C. difficile negative and was transitioned to p.o. antibiotics.  She will complete 10-day course of antibiotics with Augmentin and follow-up with PCP and general surgeon outpatient setting.  Is also recommended to her to follow-up with cardiology for new left bundle branch block as well as orthopedics if necessary for her right knee pain.  Discharge Diagnoses:  Principal Problem:   Intestine with microperforation (Sigmoid vs Meckel's) Active Problems:   Essential hypertension   Hx of adenomatous colonic polyps 2016   Common bile duct stone   HLD (hyperlipidemia)   CKD (chronic kidney disease) stage 3, GFR 30-59 ml/min (HCC)   Chronic pain   Morbid obesity with BMI of 50.0-59.9, adult  (HCC)   Type 2 diabetes mellitus, without long-term current use of insulin (HCC)   Duodenal diverticulum, periampullary   Diverticulosis of sigmoid colon  Small Bowel Diverticulitis with perforation -Noted on CT abdomen/pelvis.  -General surgery consulted and recommends medical management at this time -General surgery recommendations: ambulate, Full liquid now Soft,  -Continued Zosyn and transition to Augmentin for 10 day Course -Follow up with PCP and General Surgery within 1-2 Weeks   CKD stage III -Baseline creatinine of 1.5-1.6. Stable.  -On losartan and hydrochlorothiazide for now -CMP as an outpatient   Essential Hypertension -Uncontrolled -Continue home losartan-hydrochlorothiazide -Hydralazine IV prn  Diabetes mellitis type 2 -Patient on metformin and another medication of which she is unsure. No previous hemoglobin A1C available -SSI while hospitalized -Resume Home Medications at D/C  Diverticulosis -Chronic. No GI bleeding.  LBBB -New from last EKG back in 2018. No current chest pain. -Outpatient follow-up with [facility]; Sees Dr. Gwenlyn Found and Dr. Sallyanne Kuster  Morbid obesity -Estimated body mass index is 51.21 kg/m as calculated from the following:   Height as of this encounter: 5\' 2"  (1.575 m).   Weight as of this encounter: 127 kg. -Weight Loss and Dietary Counseling Given   Diarrhea -Will likely not treat secondary to presenting problem. Supportive care for now.  -Improved and C Difficile was Negative   Right knee pain Unknown etiology -Right knee x-ray showed Small suprapatellar right knee joint effusion. Moderate tricompartmental right knee osteoarthritis. -Started Diclofenac Gel and will D/C home with tube -Follow up with Orthopedic Surgery if needed -PT recommending no follow up   Incidental findings: -Pulmonary nodule: 6 mm, left lung base.  Repeat non-contrast chest CT at 6-12 months -Ectatic infrarenal abdominal aorta: 2.7 cm. Follow-up  ultrasound in 5 years.  Hypokalemia -Replete prior to discharge -Continue monitor replete as necessary -Repeat CMP in the outpatient setting with PCP  Discharge Instructions  Discharge Instructions    Call MD for:  difficulty breathing, headache or visual disturbances   Complete by:  As directed    Call MD for:  extreme fatigue   Complete by:  As directed    Call MD for:  hives   Complete by:  As directed    Call MD for:  persistant dizziness or light-headedness   Complete by:  As directed    Call MD for:  persistant nausea and vomiting   Complete by:  As directed    Call MD for:  redness, tenderness, or signs of infection (pain, swelling, redness, odor or green/yellow discharge around incision site)   Complete by:  As directed    Call MD for:  severe uncontrolled pain   Complete by:  As directed    Call MD for:  temperature >100.4   Complete by:  As directed    Diet - low sodium heart healthy   Complete by:  As directed    SOFT   Diet Carb Modified   Complete by:  As directed    Discharge instructions   Complete by:  As directed    You were cared for by a hospitalist during your hospital stay. If you have any questions about your discharge medications or the care you received while you were in the hospital after you are discharged, you can call the unit and ask to speak with the hospitalist on call if the hospitalist that took care of you is not available. Once you are discharged, your primary care physician will handle any further medical issues. Please note that NO REFILLS for any discharge medications will be authorized once you are discharged, as it is imperative that you return to your primary care physician (or establish a relationship with a primary care physician if you do not have one) for your aftercare needs so that they can reassess your need for medications and monitor your lab values.  Follow up with PCP, General Surgery, Cardiology, and Orthopedic Surgery in the  outpatient setting if needed. Take all medications as prescribed. If symptoms change or worsen please return to the ED for evaluation   Increase activity slowly   Complete by:  As directed      Allergies as of 11/07/2018      Reactions   Levaquin [levofloxacin In D5w] Nausea And Vomiting      Medication List    TAKE these medications   acetaminophen 500 MG tablet Commonly known as:  TYLENOL Take 1 tablet (500 mg total) by mouth every 6 (six) hours as needed for moderate pain. What changed:  how much to take   amoxicillin-clavulanate 875-125 MG tablet Commonly known as:  AUGMENTIN Take 1 tablet by mouth every 12 (twelve) hours for 7 days.   calcium carbonate 1500 (600 Ca) MG Tabs tablet Commonly known as:  OSCAL Take 1,500 mg by mouth 2 (two) times daily with a meal.   diclofenac sodium 1 % Gel Commonly known as:  VOLTAREN Apply 4 g topically 4 (four) times daily.   FISH OIL PO Take 2 capsules by mouth daily.   HYDROcodone-acetaminophen 5-325 MG tablet Commonly known as:  NORCO/VICODIN Take 1-2 tablets by mouth every 4 (four) hours as needed for moderate  pain.   lip balm ointment Apply 1 application topically 2 (two) times daily.   losartan-hydrochlorothiazide 50-12.5 MG tablet Commonly known as:  HYZAAR Take 1 tablet by mouth every morning.   lovastatin 20 MG tablet Commonly known as:  MEVACOR Take 20 mg by mouth at bedtime.   metFORMIN 500 MG tablet Commonly known as:  GLUCOPHAGE Take 500 mg by mouth 2 (two) times daily.   nystatin powder Commonly known as:  MYCOSTATIN/NYSTOP Apply topically 2 (two) times daily.      Follow-up Information    Michael Boston, MD. Schedule an appointment as soon as possible for a visit in 2 week(s).   Specialty:  General Surgery Contact information: [address] [place] [address] [phone]          Allergies  Allergen Reactions  . Levaquin [Levofloxacin In D5w] Nausea And Vomiting    Consultations:  General Surgery  Procedures/Studies: Ct Abdomen Pelvis Wo Contrast  Result Date: 11/03/2018 CLINICAL DATA:  Generalized abdominal pain, nausea and vomiting for 2 days. Prior appendectomy, hysterectomy and cholecystectomy. EXAM: CT ABDOMEN AND PELVIS WITHOUT CONTRAST TECHNIQUE: Multidetector CT imaging of the abdomen and pelvis was performed following the standard protocol without IV contrast. COMPARISON:  [id number] abdominal radiograph. 04/29/2017 CT abdomen/pelvis. FINDINGS: Lower chest: Vague 6 mm nodular opacity at peripheral left lung base (series 4/image 17), not definitely seen on 04/29/2017 CT. Right coronary atherosclerosis. Hepatobiliary: Normal liver size. No liver mass. Cholecystectomy. Bile ducts are stable and within normal post cholecystectomy limits with CBD diameter 7 mm. Moderate to large periampullary duodenal diverticulum, unchanged. Pancreas: Normal, with no mass or duct dilation. Spleen: Normal size. No mass. Adrenals/Urinary Tract: Normal adrenals. No renal stones. No hydronephrosis. Several simple right renal cysts, largest 2.1 cm in the lateral upper right kidney. No contour deforming left renal masses. Normal bladder. Stomach/Bowel: Normal non-distended stomach. There is a 4.1 cm diameter diverticulum within the midline lower abdomen, which appears to arise from a small bowel loop (series 2/image 69), which demonstrates wall thickening and surrounding fat stranding, compatible with acute small bowel diverticulitis, potentially Meckel's diverticulitis. A few tiny foci surrounding free air indicate microperforation. No associated abscess. Oral contrast transits to the colon. Appendectomy. Moderate left colonic diverticulosis, most prominent in the sigmoid colon, with no large bowel wall thickening or acute pericolonic fat stranding. Vascular/Lymphatic: Atherosclerotic abdominal aorta with ectatic 2.7 cm infrarenal abdominal aorta, not appreciably changed. No  pathologically enlarged lymph nodes in the abdomen or pelvis. Reproductive: Status post hysterectomy, with no abnormal findings at the vaginal cuff. No adnexal mass. Other: No ascites. No focal fluid collection. Small fat containing umbilical hernia. Musculoskeletal: No aggressive appearing focal osseous lesions. Marked thoracolumbar spondylosis. IMPRESSION: 1. Inflamed 4.1 cm diameter diverticulum within the midline lower abdomen appears to arise from a small bowel loop, compatible with acute small bowel diverticulitis, potentially Meckel's diverticulitis. Tiny foci of surrounding free air indicate microperforation. No abscess. 2. Moderate sigmoid diverticulosis, with no evidence of acute sigmoid diverticulitis. 3. Vague 6 mm nodular opacity at the peripheral left lung base. Non-contrast chest CT at 6-12 months is recommended. If the nodule is stable at time of repeat CT, then future CT at 18-24 months (from today's scan) is considered optional for low-risk patients, but is recommended for high-risk patients. This recommendation follows the consensus statement: Guidelines for Management of Incidental Pulmonary Nodules Detected on CT Images:From the Fleischner Society 2017; published online before print (10.1148/radiol.[id number]). 4. Ectatic 2.7 cm infrarenal abdominal aorta, at  risk for aneurysm development. Recommend follow-up aortic ultrasound in 5 years. This recommendation follows ACR consensus guidelines: White Paper of the ACR Incidental Findings Committee II on Vascular Findings. J Am Coll Radiol 2013; 70:623-762. 5.  Aortic Atherosclerosis (ICD10-I70.0). Electronically Signed   By: Ilona Sorrel M.D.   On: 11/03/2018 13:53   Dg Chest 1 View  Result Date: 11/03/2018 CLINICAL DATA:  SOB and abnormal finding on CT scan. EXAM: CHEST  1 VIEW COMPARISON:  CT earlier today; radiograph 02/06/2016 FINDINGS: Low volumes with resultant crowding of bronchovascular structures. Ill-defined airspace opacity in the left  mid lung. The 6 mm nodule seen at the left lung base on CT earlier today is not conspicuous on low volume portable chest radiography. Heart size upper limits normal for technique. Aortic Atherosclerosis (ICD10-170.0). No effusion. Visualized bones unremarkable. IMPRESSION: Low lung volumes with ill-defined left mid lung airspace opacity, may represent atelectasis, scar, or infiltrate. Electronically Signed   By: Lucrezia Europe M.D.   On: 11/03/2018 14:33   Dg Knee 3 View Right  Result Date: 11/06/2018 CLINICAL DATA:  Right knee pain for 2 weeks.  No reported injury. EXAM: RIGHT KNEE - 3 VIEW COMPARISON:  None. FINDINGS: No fracture. No dislocation. Probable small suprapatellar joint effusion. Moderate tricompartmental osteoarthritis, most prominent in the medial compartment. No suspicious focal osseous lesions. No radiopaque foreign bodies. IMPRESSION: Small suprapatellar right knee joint effusion. Moderate tricompartmental right knee osteoarthritis. Electronically Signed   By: Ilona Sorrel M.D.   On: 11/06/2018 13:54     Subjective: Seen and examined at bedside and was feeling better and able to tolerate a diet.  Denies chest pain, lightheadedness or dizziness.  No nausea, vomiting or abdominal pain.  States diarrhea is improved and she is passing gas and tolerated her for liquid diet and soft diet.  No other concerns or complaints at this time is ready to go home.  Discharge Exam: Vitals:   11/07/18 0532 11/07/18 1350  BP: (!) 168/77 (!) 159/75  Pulse: 78 77  Resp: 18 16  Temp: 97.6 F (36.4 C) (!) 97.5 F (36.4 C)  SpO2: 95% 93%   Vitals:   11/06/18 2218 11/07/18 0239 11/07/18 0532 11/07/18 1350  BP: (!) 185/80 (!) 158/76 (!) 168/77 (!) 159/75  Pulse: 95 84 78 77  Resp: 18  18 16   Temp: 97.8 F (36.6 C)  97.6 F (36.4 C) (!) 97.5 F (36.4 C)  TempSrc: Oral  Oral Oral  SpO2: 95% 95% 95% 93%  Weight:      Height:       General: Pt is alert, awake, not in acute distress Cardiovascular:  RRR, S1/S2 +, no rubs, no gallops Respiratory: Diminished bilaterally, no wheezing, no rhonchi Abdominal: Soft, NT, Distended 2/2 body habitus, bowel sounds + Extremities: Trace- 1+ edema, no cyanosis  The results of significant diagnostics from this hospitalization (including imaging, microbiology, ancillary and laboratory) are listed below for reference.    Microbiology: Recent Results (from the past 240 hour(s))  SARS Coronavirus 2 (Hosp order,Performed in [facility] lab via [employer] ID)     Status: None   Collection Time: 11/03/18  2:41 PM  Result Value Ref Range Status   SARS Coronavirus 2 ([employer] ID Now) NEGATIVE NEGATIVE Final    Comment: (NOTE) Interpretive Result Comment(s): COVID 19 Positive SARS CoV 2 target nucleic acids are DETECTED. The SARS CoV 2 RNA is generally detectable in upper and lower respiratory specimens during the acute phase of infection.  Positive  results are indicative of active infection with SARS CoV 2.  Clinical correlation with patient history and other diagnostic information is necessary to determine patient infection status.  Positive results do not rule out bacterial infection or coinfection with other viruses. The expected result is Negative. COVID 19 Negative SARS CoV 2 target nucleic acids are NOT DETECTED. The SARS CoV 2 RNA is generally detectable in upper and lower respiratory specimens during the acute phase of infection.  Negative results do not preclude SARS CoV 2 infection, do not rule out coinfections with other pathogens, and should not be used as the sole basis for treatment or other patient management decisions.  Negative results must be combined with clinical  observations, patient history, and epidemiological information. The expected result is Negative. Invalid Presence or absence of SARS CoV 2 nucleic acids cannot be determined. Repeat testing was performed on the submitted specimen and repeated Invalid results were obtained.   If clinically indicated, additional testing on a new specimen with an alternate test methodology [phone]) is advised.  The SARS CoV 2 RNA is generally detectable in upper and lower respiratory specimens during the acute phase of infection. The expected result is Negative. Fact Sheet for Patients:  GolfingFamily.no Fact Sheet for Healthcare Providers: https://www.hernandez-brewer.com/ This test is not yet approved or cleared by the [place] FDA and has been authorized for detection and/or diagnosis of SARS CoV 2 by FDA under an Emergency Use Authorization (EUA).  This EUA will remain in effect (meaning this test can be used) for the duration of the COVID19 d eclaration under Section 564(b)(1) of the Act, 21 U.S.C. section 260 641 5222 3(b)(1), unless the authorization is terminated or revoked sooner. Performed at [facility], [address] [place], [place] [address]   C difficile quick scan w PCR reflex     Status: None   Collection Time: 11/06/18 12:59 AM  Result Value Ref Range Status   C Diff antigen NEGATIVE NEGATIVE Final   C Diff toxin NEGATIVE NEGATIVE Final   C Diff interpretation No C. difficile detected.  Final    Comment: Performed at [facility], [place] [address] [place], [place] [address]    Labs: BNP (last 3 results) No results for input(s): BNP in the last 8760 hours. Basic Metabolic Panel: Recent Labs  Lab 11/03/18 1148 11/04/18 0353 11/06/18 0341 11/07/18 0424  NA 136 140 139 139  K 4.1 3.8 3.7 3.3*  CL 102 107 105 106  CO2 24 22 21* 22  GLUCOSE 109* 86 115* 113*  BUN 38* 31* 34* 31*  CREATININE 1.60* 1.36* 1.73* 1.56*  CALCIUM 9.0 8.8* 9.1 8.8*  MG  --   --   --  1.7  PHOS  --   --   --  3.2   Liver Function Tests: Recent Labs  Lab 11/03/18 1148  AST 14*  ALT 11  ALKPHOS 50  BILITOT 0.8  PROT 7.6  ALBUMIN 3.4*   Recent Labs  Lab 11/03/18 1148  LIPASE 26   No results for  input(s): AMMONIA in the last 168 hours. CBC: Recent Labs  Lab 11/03/18 1148 11/04/18 0353 11/06/18 0341 11/07/18 0424  WBC 11.1* 8.6 9.4 7.6  NEUTROABS 8.7*  --   --   --   HGB 12.8 12.7 13.1 12.4  HCT 39.6 39.8 40.1 38.1  MCV 92.7 97.1 94.1 94.3  PLT 196 193 189 203   Cardiac Enzymes: Recent Labs  Lab 11/03/18 1148  TROPONINI <  0.03   BNP: Invalid input(s): POCBNP CBG: Recent Labs  Lab 11/03/18 1637 11/03/18 1757  GLUCAP 93 92   D-Dimer No results for input(s): DDIMER in the last 72 hours. Hgb A1c No results for input(s): HGBA1C in the last 72 hours. Lipid Profile No results for input(s): CHOL, HDL, LDLCALC, TRIG, CHOLHDL, LDLDIRECT in the last 72 hours. Thyroid function studies No results for input(s): TSH, T4TOTAL, T3FREE, THYROIDAB in the last 72 hours.  Invalid input(s): FREET3 Anemia work up No results for input(s): VITAMINB12, FOLATE, FERRITIN, TIBC, IRON, RETICCTPCT in the last 72 hours. Urinalysis    Component Value Date/Time   COLORURINE YELLOW 11/03/2018 1151   APPEARANCEUR CLEAR 11/03/2018 1151   LABSPEC 1.010 11/03/2018 1151   PHURINE 6.0 11/03/2018 1151   GLUCOSEU NEGATIVE 11/03/2018 1151   HGBUR TRACE (A) 11/03/2018 [employer] 11/03/2018 1151   [place] 11/03/2018 1151   PROTEINUR 30 (A) 11/03/2018 1151   NITRITE NEGATIVE 11/03/2018 1151   LEUKOCYTESUR NEGATIVE 11/03/2018 1151   Sepsis Labs Invalid input(s): PROCALCITONIN,  WBC,  LACTICIDVEN Microbiology Recent Results (from the past 240 hour(s))  SARS Coronavirus 2 (Hosp order,Performed in [place] lab via [employer] ID)     Status: None   Collection Time: 11/03/18  2:41 PM  Result Value Ref Range Status   SARS Coronavirus 2 ([employer] ID Now) NEGATIVE NEGATIVE Final    Comment: (NOTE) Interpretive Result Comment(s): COVID 19 Positive SARS CoV 2 target nucleic acids are DETECTED. The SARS CoV 2 RNA is generally detectable in upper and lower respiratory  specimens during the acute phase of infection.  Positive results are indicative of active infection with SARS CoV 2.  Clinical correlation with patient history and other diagnostic information is necessary to determine patient infection status.  Positive results do not rule out bacterial infection or coinfection with other viruses. The expected result is Negative. COVID 19 Negative SARS CoV 2 target nucleic acids are NOT DETECTED. The SARS CoV 2 RNA is generally detectable in upper and lower respiratory specimens during the acute phase of infection.  Negative results do not preclude SARS CoV 2 infection, do not rule out coinfections with other pathogens, and should not be used as the sole basis for treatment or other patient management decisions.  Negative results must be combined with clinical  observations, patient history, and epidemiological information. The expected result is Negative. Invalid Presence or absence of SARS CoV 2 nucleic acids cannot be determined. Repeat testing was performed on the submitted specimen and repeated Invalid results were obtained.  If clinically indicated, additional testing on a new specimen with an alternate test methodology [id number]) is advised.  The SARS CoV 2 RNA is generally detectable in upper and lower respiratory specimens during the acute phase of infection. The expected result is Negative. Fact Sheet for Patients:  GolfingFamily.no Fact Sheet for Healthcare Providers: https://www.hernandez-brewer.com/ This test is not yet approved or cleared by the [place] FDA and has been authorized for detection and/or diagnosis of SARS CoV 2 by FDA under an Emergency Use Authorization (EUA).  This EUA will remain in effect (meaning this test can be used) for the duration of the COVID19 d eclaration under Section 564(b)(1) of the Act, 21 U.S.C. section 2513677837 3(b)(1), unless the authorization is terminated  or revoked sooner. Performed at [facility], [address] [place], [place] [address]   C difficile quick scan w PCR reflex     Status: None  Collection Time: 11/06/18 12:59 AM  Result Value Ref Range Status   C Diff antigen NEGATIVE NEGATIVE Final   C Diff toxin NEGATIVE NEGATIVE Final   C Diff interpretation No C. difficile detected.  Final    Comment: Performed at [facility], [place] [address] [place], [place] [address]   Time coordinating discharge: 35 minutes  SIGNED:  Kerney Elbe, DO [facility] 11/07/2018, 3:24 PM Pager is on [place]  If 7PM-7AM, please contact night-coverage [email or web address] Password [email or web address]

## 2018-11-07 NOTE — Discharge Instructions (Signed)
Low-Fiber Eating Plan Follow for 5-7 days then transition to a high fiber diet.  Fiber is found in fruits, vegetables, whole grains, and beans. Eating a diet low in fiber helps to reduce how often you have bowel movements and how much you produce during a bowel movement. A low-fiber eating plan may help your digestive system heal if:  You have certain conditions, such as Crohn's disease or diverticulitis.  You recently had radiation therapy on your pelvis or bowel.  You recently had intestinal surgery.  You have a new surgical opening in your abdomen (colostomy or ileostomy).  Your intestine is narrowed (stricture). Your health care provider will determine how long you need to stay on this diet. Your health care provider may recommend that you work with a diet and nutrition specialist (dietitian). What are tips for following this plan? General guidelines  Follow recommendations from your dietitian about how much fiber you should have each day.  Most people on this eating plan should try to eat less than 10 grams (g) of fiber each day. Your daily fiber goal is _________________ g.  Take vitamin and mineral supplements as told by your health care provider or dietitian. Chewable or liquid forms are best when on this eating plan. Reading food labels  Check food labels for the amount of dietary fiber.  Choose foods that have less than 2 grams of fiber in one serving. Cooking  Use white flour and other allowed grains for baking and cooking.  Cook meat using methods that keep it tender, such as braising or poaching.  Cook eggs until the yolk is completely solid.  Cook with healthy oils, such as olive oil or canola oil. Meal planning   Eat 5-6 small meals throughout the day instead of 3 large meals.  If you are lactose intolerant: ? Choose low-lactose dairy foods. ? Do not eat dairy foods, if told by your dietitian.  Limit fat and oils to less than 8 teaspoons a day.  Eat small  portions of desserts. What foods are allowed? The items listed below may not be a complete list. Talk with your dietitian about what dietary choices are best for you. Grains All bread and crackers made with white flour. Waffles, pancakes, and [place] toast. Bagels. Pretzels. Melba toast, zwieback, and matzoh. Cooked and dried cereals that do not contain whole grains, added fiber, seeds, or dried fruit. CornmealDomenick Gong. Hot and cold cereals made with refined corn, wheat, rice, or oats. Plain pasta and noodles. White rice. Vegetables Well-cooked or canned vegetables without skin, seeds, or stems. Cooked potatoes without skins. Vegetable juice. Fruits Soft-cooked or canned fruits without skin and seeds. Peeled ripe banana. Applesauce. Fruit juice without pulp. Meats and other protein foods Ground meat. Tender cuts of meat or poultry. Eggs. Fish, seafood, and shellfish. Smooth nut butters. Tofu. Dairy All milk products and drinks. Lactose-free milks, including rice, soy, and almond milks. Yogurt without fruit, nuts, chocolate, or granola mix-ins. Sour cream. Cottage cheese. Cheese. Beverages Decaf coffee. Fruit and vegetable juices or smoothies (in small amounts, with no pulp or skins, and with fruits from allowed list). Sports drinks. Herbal tea. Fats and oils Olive oil, canola oil, sunflower oil, flaxseed oil, and grapeseed oil. Mayonnaise. Cream cheese. Margarine. Butter. Sweets and desserts Plain cakes and cookies. Cream pies and pies made with allowed fruits. Pudding. Custard. Fruit gelatin. Sherbet. Popsicles. Ice cream without nuts. Plain hard candy. Honey. Jelly. Molasses. Syrups, including chocolate syrup. Chocolate. Marshmallows. Gumdrops. Seasoning and other foods  Bouillon. Broth. Cream soups made from allowed foods. Strained soup. Casseroles made with allowed foods. Ketchup. Mild mustard. Mild salad dressings. Plain gravies. Vinegar. Spices in moderation. Salt. Sugar. What foods are not  allowed? The items listed below may not be a complete list. Talk with your dietitian about what dietary choices are best for you. Grains Whole wheat and whole grain breads and crackers. Multigrain breads and crackers. Rye bread. Whole grain or multigrain cereals. Cereals with nuts, raisins, or coconut. Bran. Coarse wheat cereals. Granola. High-fiber cereals. Cornmeal or corn bread. Whole grain pasta. Wild or brown rice. Quinoa. Popcorn. Buckwheat. Wheat germ. Vegetables Potato skins. Raw or undercooked vegetables. All beans and bean sprouts. Cooked greens. Corn. Peas. Cabbage. Beets. Broccoli. Brussels sprouts. Cauliflower. Mushrooms. Onions. Peppers. Parsnips. Okra. Sauerkraut. Fruit Raw or dried fruit. Berries. Fruit juice with pulp. Prune juice. Meats and other protein foods Tough, fibrous meats with gristle. Fatty meat. Poultry with skin. Fried meat, Sales executive, or fish. Deli or lunch meats. Sausage, bacon, and hot dogs. Nuts and chunky nut butter. Dried peas, beans, and lentils. Dairy Yogurt with fruit, nuts, chocolate, or granola mix-ins. Beverages Caffeinated coffee and teas. Fats and oils Avocado. Coconut. Sweets and desserts Desserts, cookies, or candies that contain nuts or coconut. Dried fruit. Jams and preserves with seeds. Marmalade. Any dessert made with fruits or grains that are not allowed. Seasoning and other foods Corn tortilla chips. Soups made with vegetables or grains that are not allowed. Relish. Horseradish. Angie Fava. Olives. Summary  Most people on a low-fiber eating plan should eat less than 10 grams of fiber a day. Follow recommendations from your dietitian about how much fiber you should have each day.  Always check food labels to see the dietary fiber content of packaged foods. In general, a low-fiber food will have fewer than 2 grams of fiber per serving.  In general, try to avoid whole grains, raw fruits and vegetables, dried fruit, tough cuts of meat, nuts, and  seeds.  Take a vitamin and mineral supplement as told by your health care provider or dietitian. This information is not intended to replace advice given to you by your health care provider. Make sure you discuss any questions you have with your health care provider. Document Released: 12/10/2001 Document Revised: 08/23/2016 Document Reviewed: 08/23/2016 Elsevier Interactive Patient Education  2019 [employer].

## 2018-11-07 NOTE — Progress Notes (Signed)
Writer gave instructions to patient. Patient had no questions. Writer or NT will wheel patient downstairs to the front of the building once patient's husband comes

## 2018-11-07 NOTE — Progress Notes (Signed)
[facility]/Trauma Progress Note      Assessment/Plan Principal Problem: Intestine with microperforation (Sigmoid vs Meckel's) Active Problems: Essential hypertension Hx of adenomatous colonic polyps 2016 Common bile duct stone HLD (hyperlipidemia) CKD (chronic kidney disease) stage 3, GFR 30-59 ml/min (HCC) Chronic pain Morbid obesity with BMI of 50.0-59.9, adult (HCC) Type 2 diabetes mellitus, without long-term current use of insulin (HCC) Duodenal diverticulum, periampullary Diverticulosis of sigmoid colon  Possible small bowel diverticulitis with contained perforation - pain resolved, no nausea or vomiting, diarrhea resolved - okay for PO antibiotics - c diff neg  WUJ:WJXB diet VTE: SCD's, lovenox JY:NWGNF 05/02>>WBC 7.6, afebrile Foley:none Follow up:TBD  DISPO:no indication for surgery at this time. Pt is improved clinically and she is okay for discharge from our standpoint on PO antibiotics for a total of 10 days. Follow up in AVS.    LOS: 4 days    Subjective: CC: no complaints  No abdominal pain, nausea, vomiting, fever or chills. Diarrhea has resolved. Still having flatus. Tolerating FLD.   Objective: Vital signs in last 24 hours: Temp:  [97.6 F (36.4 C)-97.8 F (36.6 C)] 97.6 F (36.4 C) (05/06 0532) Pulse Rate:  [78-95] 78 (05/06 0532) Resp:  [18-20] 18 (05/06 0532) BP: (158-185)/(76-81) 168/77 (05/06 0532) SpO2:  [91 %-95 %] 95 % (05/06 0532) Last BM Date: 11/06/18  Intake/Output from previous day: 05/05 0701 - 05/06 0700 In: 2350.7 [P.O.:1200; I.V.:993.1; IV Piggyback:157.6] Out: 1300 [Urine:1300] Intake/Output this shift: Total I/O In: -  Out: 100 [Urine:100]  PE: Gen: Alert, NAD, pleasant, cooperative Pulm:Rate andeffort normal Abd: Soft,obese,NT/ND, +BS  Skin: no rashes noted, warm and dry  Anti-infectives: Anti-infectives (From admission, onward)   Start     Dose/Rate Route  Frequency Ordered Stop   11/03/18 1530  piperacillin-tazobactam (ZOSYN) IVPB 3.375 g     3.375 g 12.5 mL/hr over 240 Minutes Intravenous Every 8 hours 11/03/18 1524     11/03/18 1445  piperacillin-tazobactam (ZOSYN) IVPB 3.375 g     3.375 g 100 mL/hr over 30 Minutes Intravenous  Once 11/03/18 1443 11/03/18 1545   11/03/18 1430  cefTRIAXone (ROCEPHIN) 2 g in sodium chloride 0.9 % 100 mL IVPB  Status:  Discontinued     2 g 200 mL/hr over 30 Minutes Intravenous  Once 11/03/18 1420 11/03/18 1443   11/03/18 1430  metroNIDAZOLE (FLAGYL) IVPB 500 mg  Status:  Discontinued     500 mg 100 mL/hr over 60 Minutes Intravenous  Once 11/03/18 1420 11/03/18 1443      Lab Results:  Recent Labs    11/06/18 0341 11/07/18 0424  WBC 9.4 7.6  HGB 13.1 12.4  HCT 40.1 38.1  PLT 189 203   BMET Recent Labs    11/06/18 0341 11/07/18 0424  NA 139 139  K 3.7 3.3*  CL 105 106  CO2 21* 22  GLUCOSE 115* 113*  BUN 34* 31*  CREATININE 1.73* 1.56*  CALCIUM 9.1 8.8*   PT/INR No results for input(s): LABPROT, INR in the last 72 hours. CMP     Component Value Date/Time   NA 139 11/07/2018 0424   K 3.3 (L) 11/07/2018 0424   CL 106 11/07/2018 0424   CO2 22 11/07/2018 0424   GLUCOSE 113 (H) 11/07/2018 0424   BUN 31 (H) 11/07/2018 0424   CREATININE 1.56 (H) 11/07/2018 0424   CALCIUM 8.8 (L) 11/07/2018 0424   PROT 7.6 11/03/2018 1148   ALBUMIN 3.4 (L) 11/03/2018 1148   AST 14 (L) 11/03/2018  1148   ALT 11 11/03/2018 1148   ALKPHOS 50 11/03/2018 1148   BILITOT 0.8 11/03/2018 1148   GFRNONAA 31 (L) 11/07/2018 0424   GFRAA 35 (L) 11/07/2018 0424   Lipase     Component Value Date/Time   LIPASE 26 11/03/2018 1148    Studies/Results: Dg Knee 3 View Right  Result Date: 11/06/2018 CLINICAL DATA:  Right knee pain for 2 weeks.  No reported injury. EXAM: RIGHT KNEE - 3 VIEW COMPARISON:  None. FINDINGS: No fracture. No dislocation. Probable small suprapatellar joint effusion. Moderate tricompartmental  osteoarthritis, most prominent in the medial compartment. No suspicious focal osseous lesions. No radiopaque foreign bodies. IMPRESSION: Small suprapatellar right knee joint effusion. Moderate tricompartmental right knee osteoarthritis. Electronically Signed   By: Ilona Sorrel M.D.   On: 11/06/2018 13:54      Kalman Drape , [facility] 11/07/2018, 8:24 AM  Pager: [phone] Mon-Wed, Friday 7:00am-4:30pm Thurs 7am-11:30am  Consults: [phone]

## 2018-11-07 NOTE — Evaluation (Signed)
Physical Therapy Evaluation-1x Patient Details Name: Pamela [name] MRN: [id number] DOB: 1934-12-30 Today's Date: 11/07/2018   History of Present Illness  84 yo female admitted with bowel perforation, bil knee pain, R>L.  Hx of OA, DM, LE edema, obesity, osteopenia, Sz.   Clinical Impression  On eval, pt was Min guard assist for mobility. She walked ~60 feet with a rollator. Pt continues to report some bil knee pain that she attributes to arthritis. Pt tolerated short ambulation distance fairly well. She is somewhat unsteady even with RW use. Dyspnea 2/4. Discussed d/c plan-pt politely declines HHPT f/u. She stated her husband is able to assist her as needed. Pt is set to d/c home today per RN/chart. Made RN aware PT eval was completed. 1x eval.     Follow Up Recommendations Supervision for mobility/OOB(pt declines PT f/u )    Equipment Recommendations  None recommended by PT    Recommendations for Other Services       Precautions / Restrictions Precautions Precautions: Fall Restrictions Weight Bearing Restrictions: No      Mobility  Bed Mobility               General bed mobility comments: pt sitting EOB  Transfers Overall transfer level: Needs assistance Equipment used: 4-wheeled walker Transfers: Sit to/from Stand Sit to Stand: Min guard         General transfer comment: Close guard for safety. VCs hand placement  Ambulation/Gait Ambulation/Gait assistance: Min guard Gait Distance (Feet): 60 Feet Assistive device: 4-wheeled walker Gait Pattern/deviations: Step-through pattern;Decreased stride length     General Gait Details: close guard for safety. Mildly unsteady with RW use. Pt stated she could only walk a short distance-"I can't walk far."  Stairs            Wheelchair Mobility    Modified Rankin (Stroke Patients Only)       Balance Overall balance assessment: Needs assistance         Standing balance support: Bilateral upper extremity  supported Standing balance-Leahy Scale: Poor                               Pertinent Vitals/Pain Pain Assessment: Faces Faces Pain Scale: Hurts little more Pain Location: knees Pain Descriptors / Indicators: Sore;Aching Pain Intervention(s): Limited activity within patient's tolerance;Repositioned    Home Living Family/patient expects to be discharged to:: Private residence Living Arrangements: Spouse/significant other   Type of Home: House Home Access: Stairs to enter   Technical brewer of Steps: 1 Home Layout: One level Home Equipment: Environmental consultant - 4 wheels;Cane - single point      Prior Function Level of Independence: Independent with assistive device(s)         Comments: uses 3 wheeled walker     Hand Dominance        Extremity/Trunk Assessment   Upper Extremity Assessment Upper Extremity Assessment: Generalized weakness    Lower Extremity Assessment Lower Extremity Assessment: Generalized weakness    Cervical / Trunk Assessment Cervical / Trunk Assessment: Normal  Communication   Communication: No difficulties  Cognition Arousal/Alertness: Awake/alert Behavior During Therapy: WFL for tasks assessed/performed Overall Cognitive Status: Within Functional Limits for tasks assessed                                        General Comments  Exercises     Assessment/Plan    PT Assessment Patent does not need any further PT services(pt declines PT f/u)  PT Problem List Decreased balance;Decreased strength;Decreased activity tolerance;Decreased mobility;Pain;Decreased knowledge of use of DME       PT Treatment Interventions DME instruction;Functional mobility training;Balance training;Patient/family education;Therapeutic activities;Gait training;Therapeutic exercise    PT Goals (Current goals can be found in the Care Plan section)  Acute Rehab PT Goals Patient Stated Goal: home PT Goal Formulation: All assessment  and education complete, DC therapy    Frequency     Barriers to discharge        Co-evaluation               AM-PAC PT "6 Clicks" Mobility  Outcome Measure Help needed turning from your back to your side while in a flat bed without using bedrails?: A Little Help needed moving from lying on your back to sitting on the side of a flat bed without using bedrails?: A Little Help needed moving to and from a bed to a chair (including a wheelchair)?: A Little Help needed standing up from a chair using your arms (e.g., wheelchair or bedside chair)?: A Little Help needed to walk in hospital room?: A Little Help needed climbing 3-5 steps with a railing? : A Little 6 Click Score: 18    End of Session   Activity Tolerance: Patient limited by fatigue;Patient limited by pain Patient left: in bed;with call bell/phone within reach   PT Visit Diagnosis: Unsteadiness on feet (R26.81);Pain;Difficulty in walking, not elsewhere classified (R26.2) Pain - Right/Left: (bil) Pain - part of body: Knee    Time: 0300-9233 PT Time Calculation (min) (ACUTE ONLY): 10 min   Charges:   PT Evaluation $PT Eval Moderate Complexity: [place], PT Acute Rehabilitation Services Pager: [phone] Office: [phone]

## 2018-11-13 DIAGNOSIS — R69 Illness, unspecified: Secondary | ICD-10-CM | POA: Diagnosis not present

## 2018-11-20 DIAGNOSIS — K5792 Diverticulitis of intestine, part unspecified, without perforation or abscess without bleeding: Secondary | ICD-10-CM | POA: Diagnosis not present

## 2018-11-20 DIAGNOSIS — K5730 Diverticulosis of large intestine without perforation or abscess without bleeding: Secondary | ICD-10-CM | POA: Diagnosis not present

## 2018-12-04 DIAGNOSIS — E1122 Type 2 diabetes mellitus with diabetic chronic kidney disease: Secondary | ICD-10-CM | POA: Diagnosis not present

## 2018-12-04 DIAGNOSIS — E7800 Pure hypercholesterolemia, unspecified: Secondary | ICD-10-CM | POA: Diagnosis not present

## 2018-12-04 DIAGNOSIS — I700 Atherosclerosis of aorta: Secondary | ICD-10-CM | POA: Diagnosis not present

## 2018-12-04 DIAGNOSIS — N183 Chronic kidney disease, stage 3 (moderate): Secondary | ICD-10-CM | POA: Diagnosis not present

## 2018-12-04 DIAGNOSIS — K5792 Diverticulitis of intestine, part unspecified, without perforation or abscess without bleeding: Secondary | ICD-10-CM | POA: Diagnosis not present

## 2018-12-04 DIAGNOSIS — I129 Hypertensive chronic kidney disease with stage 1 through stage 4 chronic kidney disease, or unspecified chronic kidney disease: Secondary | ICD-10-CM | POA: Diagnosis not present

## 2018-12-06 DIAGNOSIS — E7800 Pure hypercholesterolemia, unspecified: Secondary | ICD-10-CM | POA: Diagnosis not present

## 2018-12-06 DIAGNOSIS — I129 Hypertensive chronic kidney disease with stage 1 through stage 4 chronic kidney disease, or unspecified chronic kidney disease: Secondary | ICD-10-CM | POA: Diagnosis not present

## 2018-12-06 DIAGNOSIS — E1122 Type 2 diabetes mellitus with diabetic chronic kidney disease: Secondary | ICD-10-CM | POA: Diagnosis not present

## 2019-01-29 DIAGNOSIS — R69 Illness, unspecified: Secondary | ICD-10-CM | POA: Diagnosis not present

## 2019-02-07 DIAGNOSIS — G8929 Other chronic pain: Secondary | ICD-10-CM | POA: Diagnosis not present

## 2019-02-07 DIAGNOSIS — Z833 Family history of diabetes mellitus: Secondary | ICD-10-CM | POA: Diagnosis not present

## 2019-02-07 DIAGNOSIS — E785 Hyperlipidemia, unspecified: Secondary | ICD-10-CM | POA: Diagnosis not present

## 2019-02-07 DIAGNOSIS — Z809 Family history of malignant neoplasm, unspecified: Secondary | ICD-10-CM | POA: Diagnosis not present

## 2019-02-07 DIAGNOSIS — E119 Type 2 diabetes mellitus without complications: Secondary | ICD-10-CM | POA: Diagnosis not present

## 2019-02-07 DIAGNOSIS — Z7984 Long term (current) use of oral hypoglycemic drugs: Secondary | ICD-10-CM | POA: Diagnosis not present

## 2019-02-07 DIAGNOSIS — Z7409 Other reduced mobility: Secondary | ICD-10-CM | POA: Diagnosis not present

## 2019-02-07 DIAGNOSIS — M1990 Unspecified osteoarthritis, unspecified site: Secondary | ICD-10-CM | POA: Diagnosis not present

## 2019-02-07 DIAGNOSIS — I10 Essential (primary) hypertension: Secondary | ICD-10-CM | POA: Diagnosis not present

## 2019-02-21 DIAGNOSIS — Z961 Presence of intraocular lens: Secondary | ICD-10-CM | POA: Diagnosis not present

## 2019-02-21 DIAGNOSIS — H26491 Other secondary cataract, right eye: Secondary | ICD-10-CM | POA: Diagnosis not present

## 2019-04-11 DIAGNOSIS — R69 Illness, unspecified: Secondary | ICD-10-CM | POA: Diagnosis not present

## 2019-04-11 DIAGNOSIS — Z23 Encounter for immunization: Secondary | ICD-10-CM | POA: Diagnosis not present

## 2019-05-28 DIAGNOSIS — M25562 Pain in left knee: Secondary | ICD-10-CM | POA: Diagnosis not present

## 2019-06-05 DIAGNOSIS — E1122 Type 2 diabetes mellitus with diabetic chronic kidney disease: Secondary | ICD-10-CM | POA: Diagnosis not present

## 2019-06-05 DIAGNOSIS — Z0000 Encounter for general adult medical examination without abnormal findings: Secondary | ICD-10-CM | POA: Diagnosis not present

## 2019-06-05 DIAGNOSIS — I129 Hypertensive chronic kidney disease with stage 1 through stage 4 chronic kidney disease, or unspecified chronic kidney disease: Secondary | ICD-10-CM | POA: Diagnosis not present

## 2019-06-05 DIAGNOSIS — I700 Atherosclerosis of aorta: Secondary | ICD-10-CM | POA: Diagnosis not present

## 2019-06-05 DIAGNOSIS — G4700 Insomnia, unspecified: Secondary | ICD-10-CM | POA: Diagnosis not present

## 2019-06-05 DIAGNOSIS — R54 Age-related physical debility: Secondary | ICD-10-CM | POA: Diagnosis not present

## 2019-06-05 DIAGNOSIS — M179 Osteoarthritis of knee, unspecified: Secondary | ICD-10-CM | POA: Diagnosis not present

## 2019-06-05 DIAGNOSIS — R351 Nocturia: Secondary | ICD-10-CM | POA: Diagnosis not present

## 2019-06-05 DIAGNOSIS — E7800 Pure hypercholesterolemia, unspecified: Secondary | ICD-10-CM | POA: Diagnosis not present

## 2019-06-26 ENCOUNTER — Other Ambulatory Visit: Payer: Self-pay

## 2019-06-26 ENCOUNTER — Other Ambulatory Visit (HOSPITAL_BASED_OUTPATIENT_CLINIC_OR_DEPARTMENT_OTHER): Payer: Self-pay | Admitting: Family Medicine

## 2019-06-26 ENCOUNTER — Ambulatory Visit (HOSPITAL_BASED_OUTPATIENT_CLINIC_OR_DEPARTMENT_OTHER)
Admission: RE | Admit: 2019-06-26 | Discharge: 2019-06-26 | Disposition: A | Discharge status: Home / Self Care | Payer: Medicare HMO | Source: Ambulatory Visit | Attending: Family Medicine | Admitting: Family Medicine

## 2019-06-26 DIAGNOSIS — L539 Erythematous condition, unspecified: Secondary | ICD-10-CM

## 2019-06-26 DIAGNOSIS — R609 Edema, unspecified: Secondary | ICD-10-CM

## 2019-06-26 DIAGNOSIS — M79662 Pain in left lower leg: Secondary | ICD-10-CM | POA: Diagnosis not present

## 2019-06-26 DIAGNOSIS — L03116 Cellulitis of left lower limb: Secondary | ICD-10-CM | POA: Diagnosis not present

## 2019-06-26 DIAGNOSIS — M7989 Other specified soft tissue disorders: Secondary | ICD-10-CM | POA: Diagnosis not present

## 2019-07-16 ENCOUNTER — Ambulatory Visit: Payer: Medicare Other | Attending: Internal Medicine

## 2019-07-16 DIAGNOSIS — Z23 Encounter for immunization: Secondary | ICD-10-CM | POA: Insufficient documentation

## 2019-07-16 NOTE — Progress Notes (Signed)
   Covid-19 Vaccination Clinic  Name:  Pamela [name]    MRN: [id number] DOB: [birth year]  07/16/2019  Ms. Pamela [name] was observed post Covid-19 immunization for 15 minutes without incidence. She was provided with Vaccine Information Sheet and instruction to access the V-Safe system.   Ms. Pamela [name] was instructed to call 911 with any severe reactions post vaccine: Marland Kitchen Difficulty breathing  . Swelling of your face and throat  . A fast heartbeat  . A bad rash all over your body  . Dizziness and weakness    Immunizations Administered    Name Date Dose VIS Date Route   Pfizer COVID-19 Vaccine 07/16/2019  8:31 AM 0.3 mL 06/14/2019 Intramuscular   Manufacturer: [employer], [employer]   Lot: [id number]   [place]: [id number]

## 2019-07-26 DIAGNOSIS — R69 Illness, unspecified: Secondary | ICD-10-CM | POA: Diagnosis not present

## 2019-07-30 IMAGING — CT CT ANGIO CHEST
2 of 9 series · 17 of 46 positions shown · IV contrast (APPLIED)
Comparison: None.

CLINICAL DATA: Acute onset severe abdominal pain radiating to the
back.

EXAM:
CT ANGIOGRAPHY CHEST, ABDOMEN AND PELVIS
TECHNIQUE: Multidetector CT imaging through the chest, abdomen and pelvis was
performed using the standard protocol during bolus administration of
intravenous contrast. Multiplanar reconstructed images and MIPs were
obtained and reviewed to evaluate the vascular anatomy.
CONTRAST:  80 cc Isovue 370 intravenous contrast.

[Series 5: axial arterial · axial · arterial · 0.93mm/px · z∈[-577,-43]mm · 14 of 198 slices shown]
[im 10/198  lung]
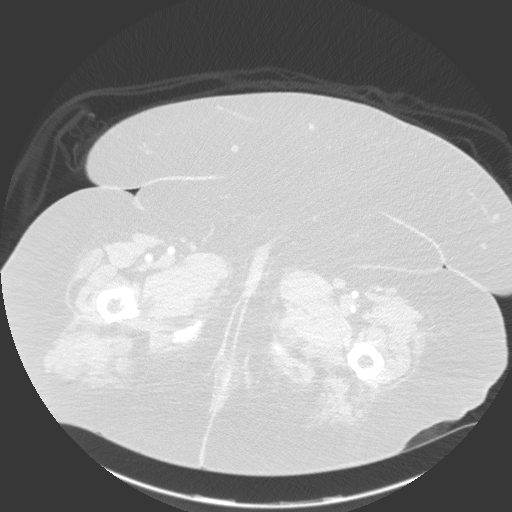
[im 30/198  soft-tissue]
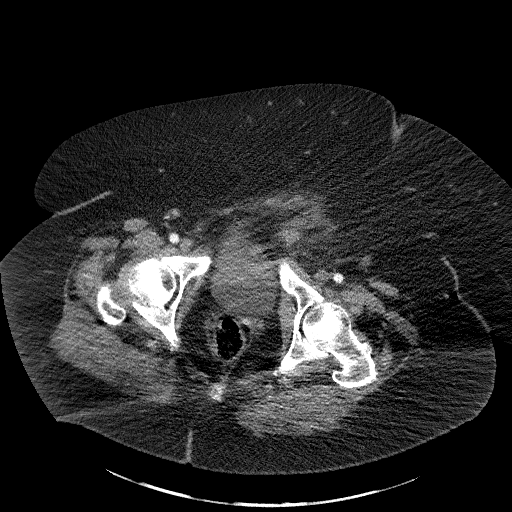
[im 40/198  lung]
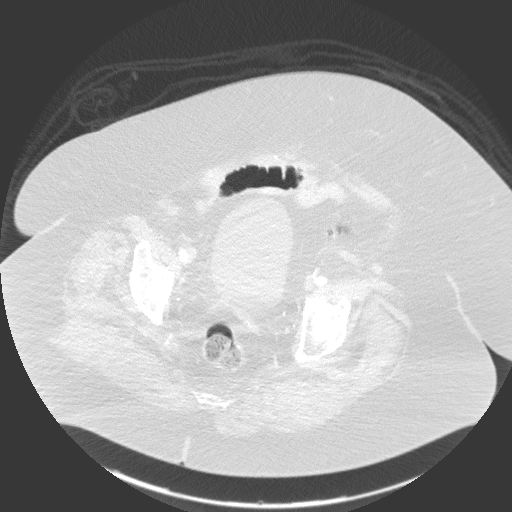
[im 50/198  soft-tissue]
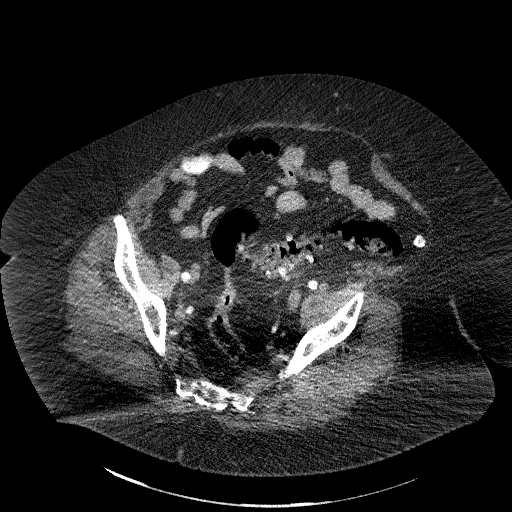
[im 69/198  lung]
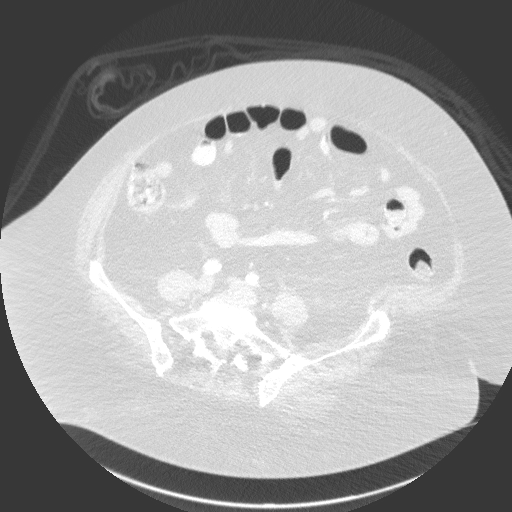
[im 79/198  soft-tissue]
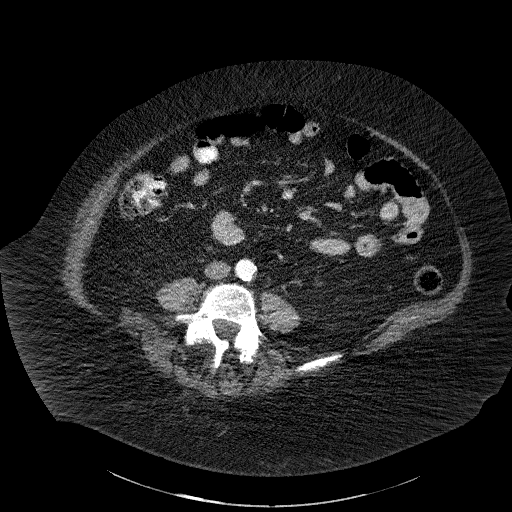
[im 89/198  lung]
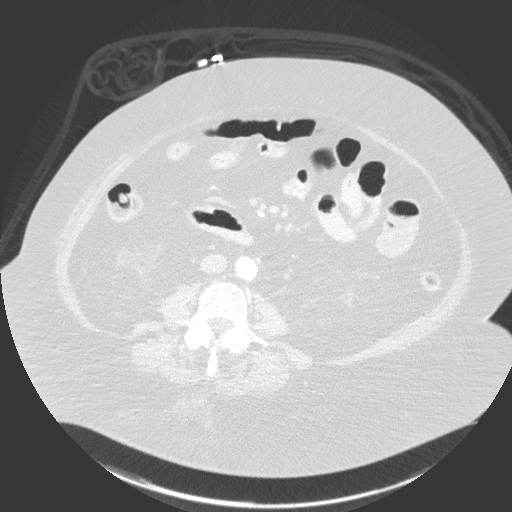
[im 109/198  soft-tissue]
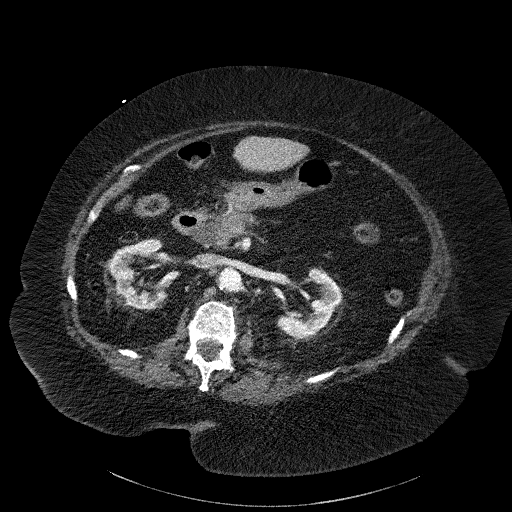
[im 119/198  lung]
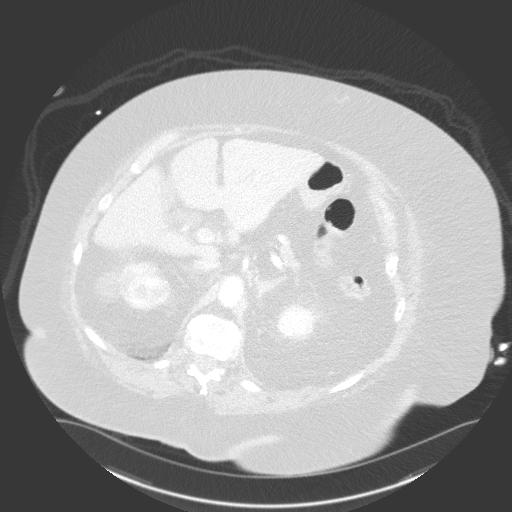
[im 129/198  soft-tissue]
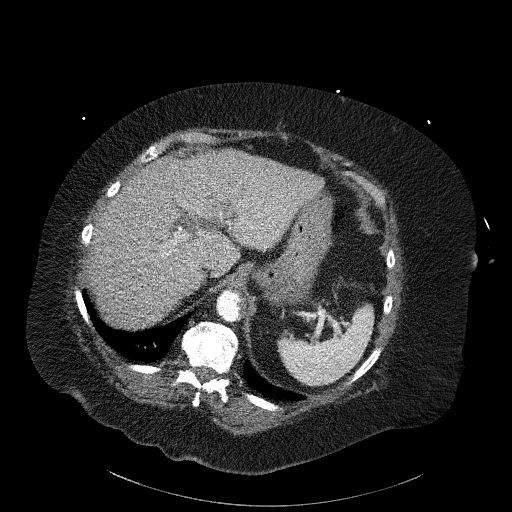
[im 148/198  lung]
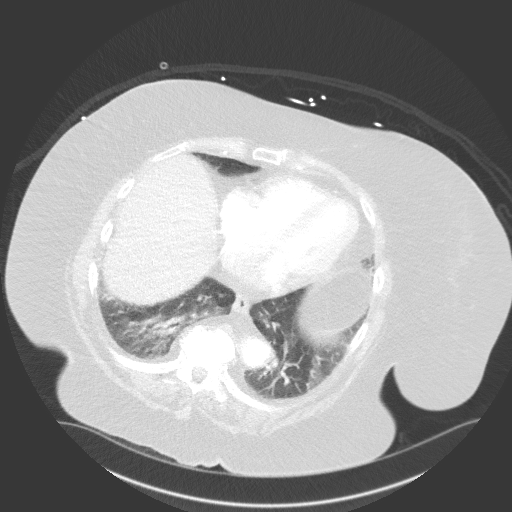
[im 158/198  soft-tissue]
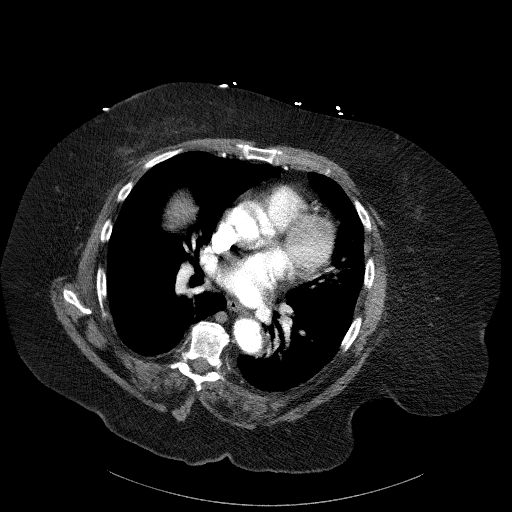
[im 168/198  lung]
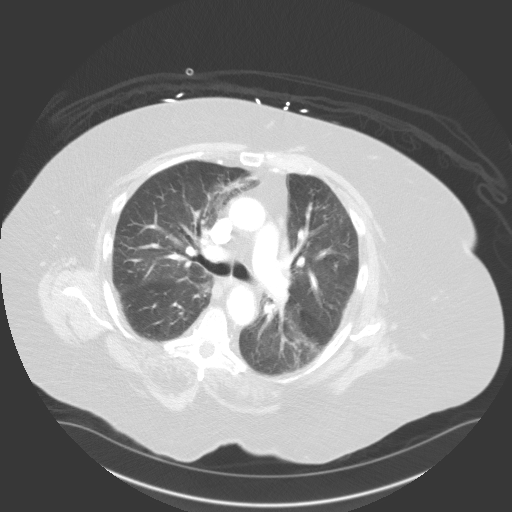
[im 188/198  soft-tissue]
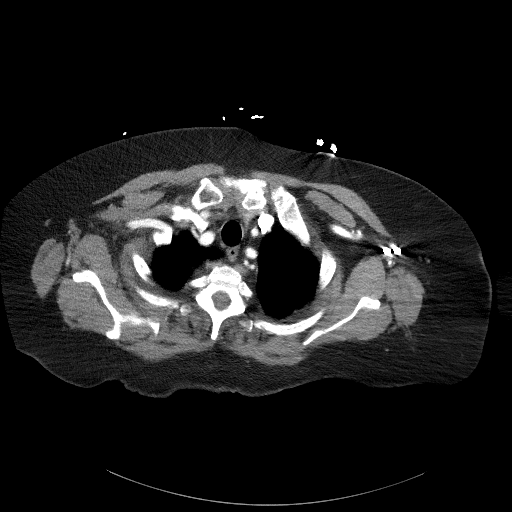

[Series 8: coronals · coronal · 1.01mm/px · 3 of 158 slices shown]
[im 40/158  soft-tissue]
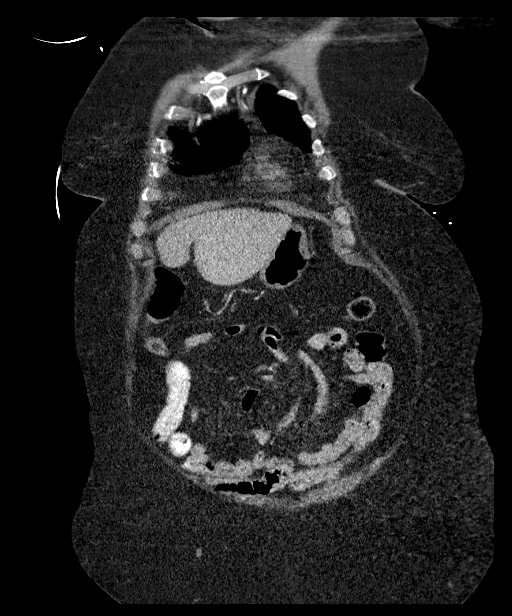
[im 79/158  soft-tissue]
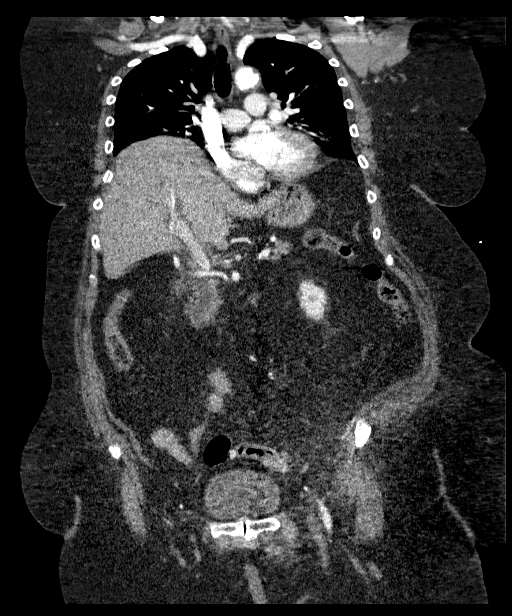
[im 118/158  soft-tissue]
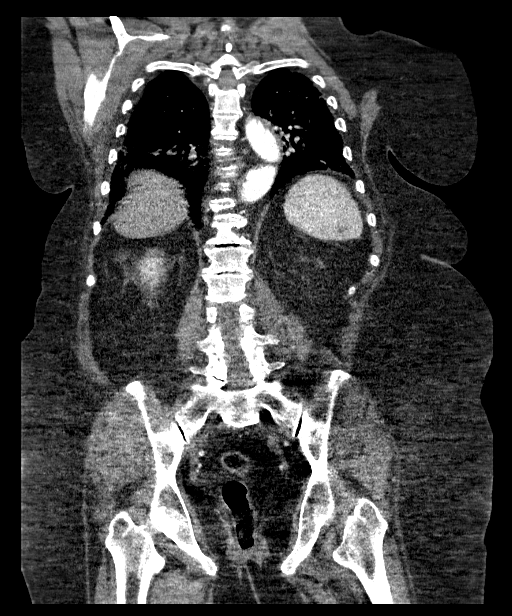

[17 of 46 positions shown; findings below may reference images not displayed]

FINDINGS: CTA CHEST FINDINGS

Cardiovascular: Preferential opacification of the thoracic aorta. No
evidence of thoracic aortic aneurysm or dissection, although
evaluation of the ascending thoracic aorta is limited by motion
artifact. Coronary, aortic, and branch vessel atherosclerotic
vascular disease. Prominent noncalcified atherosclerotic plaque at
the diaphragmatic hiatus. No evidence of penetrating ulcer.

Normal heart size. No pericardial effusion. No central pulmonary
embolism.

Mediastinum/Nodes: No enlarged mediastinal, hilar, or axillary lymph
nodes. Thyroid gland, trachea, and esophagus demonstrate no
significant findings.

Lungs/Pleura: Scattered areas of atelectasis and scarring. No
consolidation, pleural effusion or pneumothorax. No suspicious
pulmonary nodule.

Musculoskeletal: No chest wall abnormality. No acute or significant
osseous findings.

Review of the MIP images confirms the above findings.

CTA ABDOMEN AND PELVIS FINDINGS

VASCULAR

Aorta: Normal caliber aorta without aneurysm, dissection, vasculitis
or significant stenosis.

Celiac: Patent without evidence of aneurysm, dissection, vasculitis
or significant stenosis.

SMA: Patent without evidence of aneurysm, dissection, vasculitis or
significant stenosis.

Renals: Two left and single right renal arteries are patent without
evidence of aneurysm, dissection, vasculitis, fibromuscular
dysplasia or significant stenosis.

IMA: Patent without evidence of aneurysm, dissection, vasculitis or
significant stenosis.

Inflow: Patent without evidence of aneurysm, dissection, vasculitis
or significant stenosis.

Veins: No obvious venous abnormality within the limitations of this
arterial phase study.

Review of the MIP images confirms the above findings.

NON-VASCULAR

Hepatobiliary: No focal liver abnormality is seen. Status post
cholecystectomy. Mild intra and extrahepatic biliary dilatation is
likely related to post cholecystectomy state.

Pancreas: Unremarkable. No pancreatic ductal dilatation or
surrounding inflammatory changes.

Spleen: Normal in size without focal abnormality.

Adrenals/Urinary Tract: The adrenal glands are unremarkable. Mild
bilateral renal cortical atrophy. Several right renal simple cysts,
measuring up to 2.3 cm. Mild nonspecific bilateral perinephric fat
stranding. No renal calculi or hydronephrosis. The bladder is
unremarkable.

Stomach/Bowel: Stomach is within normal limits. Appendix is not
visualized. No evidence of bowel wall thickening, distention, or
inflammatory changes. Large duodenum diverticulum. Prominent sigmoid
diverticulosis.

Lymphatic: No enlarged abdominal or pelvic lymph nodes.

Reproductive: Status post hysterectomy. No adnexal masses.

Other: No free fluid or pneumoperitoneum.

Musculoskeletal: No acute or significant osseous findings.
Degenerative changes of the thoracolumbar spine.

Review of the MIP images confirms the above findings.
IMPRESSION: 1. No evidence of acute aortic syndrome. No dissection or aneurysm.
Calcified and noncalcified aortic atherosclerotic vascular disease
with prominent noncalcified plaque at the diaphragmatic hiatus
([id number]). No evidence of penetrating ulcer.
2. No acute intrathoracic or intra-abdominal process.

## 2019-08-05 ENCOUNTER — Ambulatory Visit: Payer: Medicare HMO | Attending: Internal Medicine

## 2019-08-05 DIAGNOSIS — Z23 Encounter for immunization: Secondary | ICD-10-CM | POA: Insufficient documentation

## 2019-08-05 NOTE — Progress Notes (Signed)
   Covid-19 Vaccination Clinic  Name:  Pamela [name]    MRN: [id number] DOB: [birth year]  08/05/2019  [name] was observed post Covid-19 immunization for 15 minutes without incidence. She was provided with Vaccine Information Sheet and instruction to access the V-Safe system.   [name] was instructed to call 911 with any severe reactions post vaccine: Marland Kitchen Difficulty breathing  . Swelling of your face and throat  . A fast heartbeat  . A bad rash all over your body  . Dizziness and weakness    Immunizations Administered    Name Date Dose VIS Date Route   Pfizer COVID-19 Vaccine 08/05/2019  8:25 AM 0.3 mL 06/14/2019 Intramuscular   Manufacturer: [place]   Lot: [id number]   [place]: [id number]

## 2019-09-05 DIAGNOSIS — L03115 Cellulitis of right lower limb: Secondary | ICD-10-CM | POA: Diagnosis not present

## 2019-10-01 DIAGNOSIS — I878 Other specified disorders of veins: Secondary | ICD-10-CM | POA: Diagnosis not present

## 2019-10-09 ENCOUNTER — Emergency Department (HOSPITAL_BASED_OUTPATIENT_CLINIC_OR_DEPARTMENT_OTHER): Payer: Medicare HMO

## 2019-10-09 ENCOUNTER — Other Ambulatory Visit: Payer: Self-pay

## 2019-10-09 ENCOUNTER — Inpatient Hospital Stay (HOSPITAL_BASED_OUTPATIENT_CLINIC_OR_DEPARTMENT_OTHER)
Admission: EM | Admit: 2019-10-09 | Discharge: 2019-10-11 | DRG: 291 | Disposition: A | Discharge status: Home / Self Care | Payer: Medicare HMO | Attending: Internal Medicine | Admitting: Internal Medicine

## 2019-10-09 ENCOUNTER — Encounter (HOSPITAL_BASED_OUTPATIENT_CLINIC_OR_DEPARTMENT_OTHER): Payer: Self-pay | Admitting: Emergency Medicine

## 2019-10-09 DIAGNOSIS — J810 Acute pulmonary edema: Secondary | ICD-10-CM

## 2019-10-09 DIAGNOSIS — E875 Hyperkalemia: Secondary | ICD-10-CM | POA: Diagnosis not present

## 2019-10-09 DIAGNOSIS — N1831 Chronic kidney disease, stage 3a: Secondary | ICD-10-CM | POA: Diagnosis present

## 2019-10-09 DIAGNOSIS — E1122 Type 2 diabetes mellitus with diabetic chronic kidney disease: Secondary | ICD-10-CM | POA: Diagnosis present

## 2019-10-09 DIAGNOSIS — R0602 Shortness of breath: Secondary | ICD-10-CM | POA: Diagnosis not present

## 2019-10-09 DIAGNOSIS — Z888 Allergy status to other drugs, medicaments and biological substances status: Secondary | ICD-10-CM

## 2019-10-09 DIAGNOSIS — R600 Localized edema: Secondary | ICD-10-CM | POA: Diagnosis not present

## 2019-10-09 DIAGNOSIS — Z20822 Contact with and (suspected) exposure to covid-19: Secondary | ICD-10-CM | POA: Diagnosis not present

## 2019-10-09 DIAGNOSIS — Z66 Do not resuscitate: Secondary | ICD-10-CM | POA: Diagnosis present

## 2019-10-09 DIAGNOSIS — J9601 Acute respiratory failure with hypoxia: Secondary | ICD-10-CM | POA: Diagnosis not present

## 2019-10-09 DIAGNOSIS — I447 Left bundle-branch block, unspecified: Secondary | ICD-10-CM | POA: Diagnosis not present

## 2019-10-09 DIAGNOSIS — Z6841 Body Mass Index (BMI) 40.0 and over, adult: Secondary | ICD-10-CM

## 2019-10-09 DIAGNOSIS — N179 Acute kidney failure, unspecified: Secondary | ICD-10-CM | POA: Diagnosis present

## 2019-10-09 DIAGNOSIS — I130 Hypertensive heart and chronic kidney disease with heart failure and stage 1 through stage 4 chronic kidney disease, or unspecified chronic kidney disease: Secondary | ICD-10-CM | POA: Diagnosis not present

## 2019-10-09 DIAGNOSIS — I5031 Acute diastolic (congestive) heart failure: Secondary | ICD-10-CM

## 2019-10-09 DIAGNOSIS — E785 Hyperlipidemia, unspecified: Secondary | ICD-10-CM | POA: Diagnosis present

## 2019-10-09 DIAGNOSIS — J9621 Acute and chronic respiratory failure with hypoxia: Secondary | ICD-10-CM | POA: Diagnosis present

## 2019-10-09 DIAGNOSIS — Z87891 Personal history of nicotine dependence: Secondary | ICD-10-CM | POA: Diagnosis not present

## 2019-10-09 DIAGNOSIS — Z7984 Long term (current) use of oral hypoglycemic drugs: Secondary | ICD-10-CM | POA: Diagnosis not present

## 2019-10-09 DIAGNOSIS — I110 Hypertensive heart disease with heart failure: Secondary | ICD-10-CM | POA: Diagnosis not present

## 2019-10-09 DIAGNOSIS — M8580 Other specified disorders of bone density and structure, unspecified site: Secondary | ICD-10-CM | POA: Diagnosis present

## 2019-10-09 DIAGNOSIS — Z79899 Other long term (current) drug therapy: Secondary | ICD-10-CM

## 2019-10-09 DIAGNOSIS — J811 Chronic pulmonary edema: Secondary | ICD-10-CM | POA: Diagnosis present

## 2019-10-09 DIAGNOSIS — K219 Gastro-esophageal reflux disease without esophagitis: Secondary | ICD-10-CM | POA: Diagnosis present

## 2019-10-09 LAB — URINALYSIS, MICROSCOPIC (REFLEX): WBC, UA: NONE SEEN WBC/hpf (ref 0–5)

## 2019-10-09 LAB — URINALYSIS, ROUTINE W REFLEX MICROSCOPIC
Bilirubin Urine: NEGATIVE
Glucose, UA: NEGATIVE mg/dL
Ketones, ur: NEGATIVE mg/dL
Leukocytes,Ua: NEGATIVE
Nitrite: NEGATIVE
Protein, ur: 30 mg/dL — AB
Specific Gravity, Urine: 1.01 (ref 1.005–1.030)
pH: 6 (ref 5.0–8.0)

## 2019-10-09 LAB — TROPONIN I (HIGH SENSITIVITY)
Troponin I (High Sensitivity): 10 ng/L
Troponin I (High Sensitivity): 13 ng/L

## 2019-10-09 LAB — COMPREHENSIVE METABOLIC PANEL
ALT: 12 U/L (ref 0–44)
AST: 17 U/L (ref 15–41)
Albumin: 3.8 g/dL (ref 3.5–5.0)
Alkaline Phosphatase: 60 U/L (ref 38–126)
Anion gap: 9 (ref 5–15)
BUN: 43 mg/dL — ABNORMAL HIGH (ref 8–23)
CO2: 24 mmol/L (ref 22–32)
Calcium: 9.5 mg/dL (ref 8.9–10.3)
Chloride: 104 mmol/L (ref 98–111)
Creatinine, Ser: 2.04 mg/dL — ABNORMAL HIGH (ref 0.44–1.00)
GFR calc Af Amer: 25 mL/min — ABNORMAL LOW (ref >60)
GFR calc non Af Amer: 22 mL/min — ABNORMAL LOW (ref >60)
Glucose, Bld: 91 mg/dL (ref 70–99)
Potassium: 6 mmol/L — ABNORMAL HIGH (ref 3.5–5.1)
Sodium: 137 mmol/L (ref 135–145)
Total Bilirubin: 0.5 mg/dL (ref 0.3–1.2)
Total Protein: 7.6 g/dL (ref 6.5–8.1)

## 2019-10-09 LAB — CBC WITH DIFFERENTIAL/PLATELET
Abs Immature Granulocytes: 0.02 10*3/uL (ref 0.00–0.07)
Basophils Absolute: 0.1 10*3/uL (ref 0.0–0.1)
Basophils Relative: 1 %
Eosinophils Absolute: 0.4 10*3/uL (ref 0.0–0.5)
Eosinophils Relative: 6 %
HCT: 40.4 % (ref 36.0–46.0)
Hemoglobin: 12.9 g/dL (ref 12.0–15.0)
Immature Granulocytes: 0 %
Lymphocytes Relative: 19 %
Lymphs Abs: 1.2 10*3/uL (ref 0.7–4.0)
MCH: 30.9 pg (ref 26.0–34.0)
MCHC: 31.9 g/dL (ref 30.0–36.0)
MCV: 96.9 fL (ref 80.0–100.0)
Monocytes Absolute: 0.7 10*3/uL (ref 0.1–1.0)
Monocytes Relative: 11 %
Neutro Abs: 4 10*3/uL (ref 1.7–7.7)
Neutrophils Relative %: 63 %
Platelets: 184 10*3/uL (ref 150–400)
RBC: 4.17 MIL/uL (ref 3.87–5.11)
RDW: 14.1 % (ref 11.5–15.5)
WBC: 6.3 10*3/uL (ref 4.0–10.5)
nRBC: 0 % (ref 0.0–0.2)

## 2019-10-09 LAB — BRAIN NATRIURETIC PEPTIDE: B Natriuretic Peptide: 91.9 pg/mL (ref 0.0–100.0)

## 2019-10-09 LAB — TSH: TSH: 5.135 u[IU]/mL — ABNORMAL HIGH (ref 0.350–4.500)

## 2019-10-09 LAB — GLUCOSE, CAPILLARY: Glucose-Capillary: 77 mg/dL (ref 70–99)

## 2019-10-09 LAB — CBG MONITORING, ED: Glucose-Capillary: 100 mg/dL — ABNORMAL HIGH (ref 70–99)

## 2019-10-09 LAB — SARS CORONAVIRUS 2 (TAT 6-24 HRS): SARS Coronavirus 2: NEGATIVE

## 2019-10-09 MED ORDER — INSULIN ASPART 100 UNIT/ML ~~LOC~~ SOLN: 0.0000 [IU] | Freq: Three times a day (TID) | SUBCUTANEOUS | Status: DC

## 2019-10-09 MED ORDER — LORAZEPAM 2 MG/ML IJ SOLN
1.0000 mg | Freq: Once | INTRAMUSCULAR | Status: AC
  Administered 2019-10-09: 22:00:00 1 mg via INTRAVENOUS
  Filled 2019-10-09: qty 1

## 2019-10-09 MED ORDER — SODIUM CHLORIDE 0.9 % IV SOLN
1.0000 g | Freq: Once | INTRAVENOUS | Status: AC
  Administered 2019-10-09: 15:00:00 1 g via INTRAVENOUS
  Filled 2019-10-09: qty 10

## 2019-10-09 MED ORDER — ENOXAPARIN SODIUM 30 MG/0.3ML ~~LOC~~ SOLN: 30.0000 mg | SUBCUTANEOUS | Status: DC

## 2019-10-09 MED ORDER — INSULIN REGULAR HUMAN 100 UNIT/ML IJ SOLN
5.0000 [IU] | Freq: Once | INTRAMUSCULAR | Status: DC
  Filled 2019-10-09: qty 3

## 2019-10-09 MED ORDER — INSULIN ASPART 100 UNIT/ML ~~LOC~~ SOLN
0.0000 [IU] | Freq: Every day | SUBCUTANEOUS | Status: DC
  Administered 2019-10-10: 2 [IU] via SUBCUTANEOUS

## 2019-10-09 MED ORDER — DEXTROSE 50 % IV SOLN
0.5000 | Freq: Once | INTRAVENOUS | Status: AC
  Administered 2019-10-09: 15:00:00 25 mL via INTRAVENOUS
  Filled 2019-10-09: qty 50

## 2019-10-09 MED ORDER — FUROSEMIDE 10 MG/ML IJ SOLN
40.0000 mg | Freq: Three times a day (TID) | INTRAMUSCULAR | Status: DC
  Administered 2019-10-09 – 2019-10-11 (×5): 40 mg via INTRAVENOUS
  Filled 2019-10-09 (×4): qty 4

## 2019-10-09 MED ORDER — INSULIN ASPART 100 UNIT/ML ~~LOC~~ SOLN: 5.0000 [IU] | Freq: Once | SUBCUTANEOUS | Status: DC

## 2019-10-09 MED ORDER — ENOXAPARIN SODIUM 40 MG/0.4ML ~~LOC~~ SOLN
30.0000 mg | SUBCUTANEOUS | Status: DC
  Filled 2019-10-09: qty 0.4

## 2019-10-09 MED ORDER — INSULIN ASPART 100 UNIT/ML ~~LOC~~ SOLN
SUBCUTANEOUS | Status: AC
  Filled 2019-10-09: qty 1

## 2019-10-09 MED ORDER — CALCIUM GLUCONATE-NACL 1-0.675 GM/50ML-% IV SOLN
INTRAVENOUS | Status: AC
  Filled 2019-10-09: qty 50

## 2019-10-09 MED ORDER — INSULIN REGULAR HUMAN 100 UNIT/ML IJ SOLN
5.0000 [IU] | Freq: Once | INTRAMUSCULAR | Status: AC
  Administered 2019-10-09: 15:00:00 5 [IU] via INTRAVENOUS
  Filled 2019-10-09: qty 3

## 2019-10-09 MED ORDER — FUROSEMIDE 10 MG/ML IJ SOLN
40.0000 mg | Freq: Once | INTRAMUSCULAR | Status: AC
  Administered 2019-10-09: 16:00:00 40 mg via INTRAVENOUS
  Filled 2019-10-09: qty 4

## 2019-10-09 MED ORDER — ENOXAPARIN SODIUM 40 MG/0.4ML ~~LOC~~ SOLN
30.0000 mg | SUBCUTANEOUS | Status: DC
  Administered 2019-10-09 – 2019-10-10 (×2): 30 mg via SUBCUTANEOUS
  Filled 2019-10-09 (×2): qty 0.4

## 2019-10-09 NOTE — ED Notes (Signed)
Pt ambulated with walker, O2 dropped to 80-82%. SOB on exertion noted.  Pt placed back in bed with N/C @ 2.5L

## 2019-10-09 NOTE — ED Triage Notes (Signed)
Pt c/o sob with ambulation x 2 weeks, with worsening sob at rest. Pt denies CP, recently taking abx for cellulitis

## 2019-10-09 NOTE — Plan of Care (Signed)
85 year old female comes from [place] lives at home admitted with gradually worsening shortness of breath for the last 2 weeks. Covid is pending. Chest x-ray shows pulmonary vascular congestion. She is found to have hyperkalemia, AKI on CKD and hypoxia on 2-1/2 L of oxygen.  Patient not on oxygen prior to admission to the hospital.  Low suspicion for COVID-19.  Her K was found to be 6.0 she was given calcium gluconate dextrose and insulin.  Lasix 40 mg x 1 dose given.  Will admit her to telemetry.  Please address CODE STATUS once she is in the hospital.  Looks like she was DNR in the past.

## 2019-10-09 NOTE — ED Notes (Signed)
Patient transported to CT 

## 2019-10-09 NOTE — ED Provider Notes (Signed)
Medical screening examination/treatment/procedure(s) were conducted as a shared visit with non-physician practitioner(s) and myself.  I personally evaluated the patient during the encounter.  EKG Interpretation  Date/Time:  Wednesday October 09 2019 12:06:27 EDT Ventricular Rate:  94 PR Interval:    QRS Duration: 140 QT Interval:  392 QTC Calculation: 491 R Axis:   -43 Text Interpretation: Sinus rhythm Left bundle branch block No significant change since last tracing Reconfirmed by Fredia Sorrow [phone]) on 10/09/2019 12:43:59 PM   Patient seen by me along with physician assistant.  Patient presenting today with complaint of shortness of breath with ambulation for 2 weeks.  Also some increased swelling to the right leg and redness.  Patient does not use oxygen at home.  Here on room air her oxygen sats are fine at rest but when she ambulates they dropped down significantly into the 80s.  Patient on 2 L of oxygen satting in the upper 90s at rest.  Work-up here today include a chest x-ray which showed some pulmonary vascular congestion but no frank congestive heart failure.  Labs showed elevated potassium at 6.0.  And worsening BUN and creatinine consistent with an acute kidney injury.  Patient is not on diuretics.  Patient's past medical history is significant for bilateral lower extremity.  But here today the right leg is more swollen than the left.  She also has a diabetes.  Patient does not have a history of congestive heart failure.  But does have left bundle branch block.  That has been present at least for the past year.  Patient denied any chest pain.  Initial troponin was 10.  Repeat troponin is pending.  BNP was 91.   We will get an ultrasound of the right lower extremity due to the increased swelling although there is redness on that extremity and it could just be due to the cellulitis.  But do not have a good explanation at this point in time for her hypoxia with exertion.  If patient does  have evidence of blood clot in the leg.  Probably will not require treatment and may be consideration for VQ scan.  Not a candidate for CT scan.  Patient will require admission.  Discussed discussed with the hospitalist by the physician assistant.  And they will admit her at [facility] long telemetry.  Covid testing is pending.   Fredia Sorrow, MD 10/09/19 [phone]

## 2019-10-09 NOTE — ED Notes (Signed)
Pharmacy and medications updated with patient 

## 2019-10-09 NOTE — ED Provider Notes (Signed)
[facility] EMERGENCY DEPARTMENT Provider Note   CSN: [id number] Arrival date & time: 10/09/19  1152     History Chief Complaint  Patient presents with  . Shortness of Breath    Pamela [name] is a 85 y.o. female.  HPI  Patient is a 85 year old female with a history of bilateral lower extremity edema, HTN, HLD, CKD 3, morbid obesity, DM 2  Patient presents today for 2 weeks of gradually worsening dyspnea on exertion.  She states that she initially was able to walk relatively fair distances with her walker however she states that now she is barely able to go 10 steps without feeling acutely short of breath.  She states that she has never been on oxygen before and does not have oxygen at home.  She states that she has no chest pain, nausea, vomiting, headache, dizziness.  However she does state that she does occasionally feel somewhat lightheaded on exertion as well.  She denies any new medications.  She states that she has been on Lasix in the past for lower extremity edema however was unable to continue Lasix as result of being all the time.  States that she is on antibiotics for lower extremity cellulitis however she states that she has no lower extremity pain or swelling.  She states that her cellulitis is dramatically improved.  She denies any fevers or chills.     Past Medical History:  Diagnosis Date  . Arthritis   . Bilateral lower extremity edema    left lower extremity cellulitis  . Common bile duct stone 06/22/2017  . Diabetes ([place])   . Diverticulosis   . Duodenal diverticulum, periampullary 11/03/2018   Aborted ERCP 2016  . GERD (gastroesophageal reflux disease)   . History of hiatal hernia   . Hypertension   . Kidney disease, chronic, stage III (GFR 30-59 ml/min)   . Obesity   . Osteopenia   . Seizures ([place]) 22 yrs ago   no cause found  . Tubular adenoma of colon 2009    Patient Active Problem List   Diagnosis Date Noted  . Pulmonary edema  10/09/2019  . Duodenal diverticulum, periampullary 11/03/2018  . Diverticulosis of sigmoid colon 11/03/2018  . Intestine with microperforation (Sigmoid vs Meckel's) 11/03/2018  . Common bile duct stone 06/22/2017  . HLD (hyperlipidemia) 06/22/2017  . CKD (chronic kidney disease) stage 3, GFR 30-59 ml/min 06/22/2017  . Chronic pain 06/22/2017  . Morbid obesity with BMI of 50.0-59.9, adult ([place]) 06/22/2017  . Seizures ([place]) 06/22/2017  . Type 2 diabetes mellitus, without long-term current use of insulin ([place]) 06/22/2017  . RUQ pain   . Bloating 11/04/2014  . Hx of adenomatous colonic polyps 2016   . Bilateral lower extremity edema 09/10/2013  . Essential hypertension 09/10/2013    Past Surgical History:  Procedure Laterality Date  . ABDOMINAL HYSTERECTOMY    . APPENDECTOMY    . CHOLECYSTECTOMY OPEN  1977  . COLONOSCOPY WITH PROPOFOL N/A 10/16/2014   Procedure: COLONOSCOPY WITH PROPOFOL;  Surgeon: [name], MD;  Location: [facility] ENDOSCOPY;  Service: Endoscopy;  Laterality: N/A;  . ENDOSCOPIC RETROGRADE CHOLANGIOPANCREATOGRAPHY (ERCP) WITH PROPOFOL N/A 06/02/2017   Procedure: ENDOSCOPIC RETROGRADE CHOLANGIOPANCREATOGRAPHY (ERCP) WITH PROPOFOL;  Surgeon: [name], MD;  Location: [facility] ENDOSCOPY;  Service: Endoscopy;  Laterality: N/A;  . TONSILLECTOMY       OB History   No obstetric history on file.     History reviewed. No pertinent family history.  Social History  Tobacco Use  . Smoking status: Former Smoker    Packs/day: 1.00    Years: 20.00    Pack years: 20.00    Types: Cigarettes  . Smokeless tobacco: Never Used  . Tobacco comment: quit smoking 30 yrs ago  Substance Use Topics  . Alcohol use: No  . Drug use: No    Home Medications Prior to Admission medications   Medication Sig Start Date End Date Taking? Authorizing Provider  acetaminophen (TYLENOL) 500 MG tablet Take 1 tablet (500 mg total) by mouth every 6 (six) hours as needed for moderate pain.  11/07/18   Raiford Noble Latif, DO  calcium carbonate (OSCAL) 1500 (600 Ca) MG TABS tablet Take 1,500 mg by mouth 2 (two) times daily with a meal.    [provider]  diclofenac sodium (VOLTAREN) 1 % GEL Apply 4 g topically 4 (four) times daily. 11/07/18   Raiford Noble Latif, DO  HYDROcodone-acetaminophen (NORCO/VICODIN) 5-325 MG tablet Take 1-2 tablets by mouth every 4 (four) hours as needed for moderate pain. 11/07/18   Sheikh, Georgina Quint Latif, DO  lip balm (CARMEX) ointment Apply 1 application topically 2 (two) times daily. 11/07/18   Sheikh, Omair Latif, DO  losartan-hydrochlorothiazide (HYZAAR) 50-12.5 MG per tablet Take 1 tablet by mouth every morning.     [provider]  lovastatin (MEVACOR) 20 MG tablet Take 20 mg by mouth at bedtime.    [provider]  metFORMIN (GLUCOPHAGE) 500 MG tablet Take 500 mg by mouth 2 (two) times daily.    [provider]  nystatin (MYCOSTATIN/NYSTOP) powder Apply topically 2 (two) times daily. 11/07/18   Sheikh, Georgina Quint Latif, DO  Omega-3 Fatty Acids (FISH OIL PO) Take 2 capsules by mouth daily.    [provider]    Allergies    Levaquin [levofloxacin in d5w]  Review of Systems   Review of Systems  Constitutional: Positive for fatigue. Negative for chills and fever.  HENT: Negative for congestion.   Eyes: Negative for pain.  Respiratory: Positive for shortness of breath. Negative for cough.   Cardiovascular: Negative for chest pain.  Gastrointestinal: Negative for abdominal pain, diarrhea, nausea and vomiting.  Genitourinary: Negative for dysuria.  Musculoskeletal: Negative for myalgias.  Skin: Negative for rash.  Neurological: Negative for dizziness and headaches.    Physical Exam Updated Vital Signs BP (!) 158/65   Pulse 92   Temp 97.6 F (36.4 C) (Oral)   Resp (!) 21   Ht 5\' 3"  (1.6 m)   Wt 132 kg   SpO2 96%   BMI 51.55 kg/m   Physical Exam Vitals and nursing note reviewed.  Constitutional:       General: She is not in acute distress.    Appearance: She is obese. She is ill-appearing (chronically ill appearing).  HENT:     Head: Normocephalic and atraumatic.     Nose: Nose normal.     Mouth/Throat:     Mouth: Mucous membranes are moist.  Eyes:     General: No scleral icterus. Cardiovascular:     Rate and Rhythm: Normal rate and regular rhythm.     Pulses: Normal pulses.     Heart sounds: Normal heart sounds.  Pulmonary:     Breath sounds: Rales present. No wheezing.     Comments: Faint crackles diffusely worse in left lower lobe Chest:     Chest wall: No tenderness.  Abdominal:     Palpations: Abdomen is soft.     Tenderness: There is  no abdominal tenderness. There is no guarding or rebound.     Comments: Soft, obese abdomen  Musculoskeletal:     Cervical back: Normal range of motion.     Right lower leg: Edema present.     Left lower leg: Edema present.     Comments: 1+ bilateral lower extremity edema BL.  Faint rubor of the RLE with healing cellulitis-RLE very slightly larger than LLE.  No tenderness to palpation of calf or popliteal fossa.  Negative Homans' sign.  Skin:    General: Skin is warm and dry.     Capillary Refill: Capillary refill takes less than 2 seconds.  Neurological:     Mental Status: She is alert. Mental status is at baseline.  Psychiatric:        Mood and Affect: Mood normal.        Behavior: Behavior normal.     ED Results / Procedures / Treatments   Labs (all labs ordered are listed, but only abnormal results are displayed) Labs Reviewed  COMPREHENSIVE METABOLIC PANEL - Abnormal; Notable for the following components:      Result Value   Potassium 6.0 (*)    BUN 43 (*)    Creatinine, Ser 2.04 (*)    GFR calc non Af Amer 22 (*)    GFR calc Af Amer 25 (*)    All other components within normal limits  SARS CORONAVIRUS 2 (TAT 6-24 HRS)  CBC WITH DIFFERENTIAL/PLATELET  BRAIN NATRIURETIC PEPTIDE  TSH  URINALYSIS, ROUTINE W REFLEX  MICROSCOPIC  CBC  CREATININE, SERUM  CBG MONITORING, ED  TROPONIN I (HIGH SENSITIVITY)  TROPONIN I (HIGH SENSITIVITY)    EKG EKG Interpretation  Date/Time:  Wednesday October 09 2019 12:06:27 EDT Ventricular Rate:  94 PR Interval:    QRS Duration: 140 QT Interval:  392 QTC Calculation: 491 R Axis:   -43 Text Interpretation: Sinus rhythm Left bundle branch block No significant change since last tracing Reconfirmed by Fredia Sorrow [phone]) on 10/09/2019 12:43:59 PM   Radiology DG Chest 2 View  Result Date: 10/09/2019 CLINICAL DATA:  Shortness of breath for 2 weeks. EXAM: CHEST - 2 VIEW COMPARISON:  Single-view of the chest 11/03/2018. CT chest 04/29/2017. FINDINGS: Lung volumes are low with crowding of the bronchovascular structures. There is pulmonary vascular congestion. No consolidative process, pneumothorax or effusion. Heart size is normal. Atherosclerosis noted. IMPRESSION: Pulmonary vascular congestion. Atherosclerosis. Electronically Signed   By: Inge Rise M.D.   On: 10/09/2019 13:23    Procedures .Critical Care Performed by: Tedd Sias, PA Authorized by: Tedd Sias, PA   Critical care provider statement:    Critical care time (minutes):  41   Critical care time was exclusive of:  Separately billable procedures and treating other patients and teaching time   Critical care was necessary to treat or prevent imminent or life-threatening deterioration of the following conditions:  Respiratory failure   Critical care was time spent personally by me on the following activities:  Discussions with consultants, evaluation of patient's response to treatment, examination of patient, review of old charts, re-evaluation of patient's condition, pulse oximetry, ordering and review of radiographic studies, ordering and review of laboratory studies and ordering and performing treatments and interventions   I assumed direction of critical care for this patient from another  provider in my specialty: no     (including critical care time)  Medications Ordered in ED Medications  calcium gluconate in NaCl 1-0.675 GM/50ML-% IVPB (has no administration  in time range)  furosemide (LASIX) injection 40 mg (has no administration in time range)  enoxaparin (LOVENOX) injection 30 mg (has no administration in time range)  calcium gluconate 1 g in sodium chloride 0.9 % 100 mL IVPB (1 g Intravenous New Bag/Given 10/09/19 1430)  dextrose 50 % solution 25 mL (25 mLs Intravenous Given 10/09/19 1434)  insulin regular (NOVOLIN R) 100 units/mL injection 5 Units (5 Units Intravenous Given 10/09/19 1433)    ED Course  I have reviewed the triage vital signs and the nursing notes.  Pertinent labs & imaging results that were available during my care of the patient were reviewed by me and considered in my medical decision making (see chart for details).  Patient is a 85 year old female with a history of CKD, HLD, HTN, lower extremity edema that is chronic, DM 2 presented today with chief complaint of dyspnea on exertion for 2 weeks.   Given patient symptoms concern for COPD VS asthma VS pneumothorax VS CHF new onset, VS PE VS pulmonary edema.  Low suspicion for PE as patient has no chest pain, is not tachycardic however she does have some right lower extremity swelling likely secondary to the healing cellulitis of her right lower extremity.  Will obtain Doppler ultrasound to rule this out as a VTE.  Patient has CMP notable for potassium 6.0-patient given calcium and insulin and 1/2 amp of D50.  Patient also with worsening kidney function with BUN and creatinine elevated.  Appears the patient's baseline GFR is 31 today it is low 20s.  CBC unremarkable.  No anemia or slight leukocytosis.  Troponin X2 within normal limits.  BNP 91.9 however patient is obese this may be factitiously normal.  CXR independently reviewed by myself shows pulmonary vascular congestion.  This correlates with  auscultation of crackles.  I discussed with Dr. Zigmund Daniel recommends 40 of Lasix.  This was provided to patient.  EKG no acute changes.  She does have a left on a bench block however this is chronic.  Discussed patient with Dr. Zigmund Daniel of [facility] hospitalist who will admit patient to telemetry floor of [facility] hospital.  I updated patient on which hospital she will be admitted to.  Patient and husband are understanding of plan this time.    MDM Rules/Calculators/A&P                      I discussed this case with my attending physician who cosigned this note including patient's presenting symptoms, physical exam, and planned diagnostics and interventions. Attending physician stated agreement with plan or made changes to plan which were implemented.   Attending physician assessed patient at bedside.  Empriss Vasudevan Rettke was evaluated in Emergency Department on 10/09/2019 for the symptoms described in the history of present illness. She was evaluated in the context of the global COVID-19 pandemic, which necessitated consideration that the patient might be at risk for infection with the SARS-CoV-2 virus that causes COVID-19. Institutional protocols and algorithms that pertain to the evaluation of patients at risk for COVID-19 are in a state of rapid change based on information released by regulatory bodies including the CDC and federal and state organizations. These policies and algorithms were followed during the patient's care in the ED.  Final Clinical Impression(s) / ED Diagnoses Final diagnoses:  SOB (shortness of breath)    Rx / DC Orders ED Discharge Orders    None       Tedd Sias, [place] 10/09/19 1533  Fredia Sorrow, MD 10/10/19 [phone]

## 2019-10-09 NOTE — H&P (Signed)
History and Physical    Pamela [name] [id number] DOB: Aug 07, 1934 DOA: 10/09/2019  PCP: Orpah Melter, MD  Chief Complaint: Shortness of breath  HPI: Pamela [name] is a 85 y.o. female with history of bilateral lower extremity edema but declines hearing the diagnosis "heart failure" previously, HTN, HLD, CKD 3, morbid obesity, Non-insulin dependent DM 2.  Patient presents today for 01/21/2020 to [place] after approximately 2 weeks of worsening dyspnea with exertion and orthopnea.  She indicates that previously was using a walker although only able to ambulate short distances at baseline, now essentially nonambulatory.  On evaluation at [facility] she was noted to be markedly hypoxic and denies ever being on or requiring oxygen in the past.  She denies any recent travel sick contacts, headache, fevers, chills, nausea, vomiting, diarrhea, chest pain.  She indicates that with long distance ambulation, which for her is 50 feet are greater, she has episodes of lightheadedness but not true vertigo or dizziness.  She has been placed on Lasix in the past for lower extremity edema but appears to have been taken off this medication or to stop this medication on her own in the past few weeks, unclear reasoning for this.  She has previously been on antibiotics for lower extremity cellulitis and that this is now resolved.  Patient was transferred to [facility] for further evaluation and treatment of what appears to be volume overload without clear etiology given no known history of heart failure or venous stasis.  Review of Systems: As per above.  Past Medical History:  Diagnosis Date   Arthritis    Bilateral lower extremity edema    left lower extremity cellulitis   Common bile duct stone 06/22/2017   Diabetes ([place])    Diverticulosis    Duodenal diverticulum, periampullary 11/03/2018   Aborted ERCP 2016   GERD (gastroesophageal reflux disease)    History of  hiatal hernia    Hypertension    Kidney disease, chronic, stage III (GFR 30-59 ml/min)    Obesity    Osteopenia    Seizures ([place]) 22 yrs ago   no cause found   Tubular adenoma of colon 2009    Past Surgical History:  Procedure Laterality Date   ABDOMINAL HYSTERECTOMY     APPENDECTOMY     CHOLECYSTECTOMY OPEN  1977   COLONOSCOPY WITH PROPOFOL N/A 10/16/2014   Procedure: COLONOSCOPY WITH PROPOFOL;  Surgeon: [name], MD;  Location: [facility] ENDOSCOPY;  Service: Endoscopy;  Laterality: N/A;   ENDOSCOPIC RETROGRADE CHOLANGIOPANCREATOGRAPHY (ERCP) WITH PROPOFOL N/A 06/02/2017   Procedure: ENDOSCOPIC RETROGRADE CHOLANGIOPANCREATOGRAPHY (ERCP) WITH PROPOFOL;  Surgeon: [name], MD;  Location: [facility] ENDOSCOPY;  Service: Endoscopy;  Laterality: N/A;   TONSILLECTOMY       reports that she has quit smoking. Her smoking use included cigarettes. She has a 20.00 pack-year smoking history. She has never used smokeless tobacco. She reports that she does not drink alcohol or use drugs.  Allergies  Allergen Reactions   Levaquin [Levofloxacin In D5w] Nausea And Vomiting    History reviewed. No pertinent family history.  Prior to Admission medications   Medication Sig Start Date End Date Taking? Authorizing Provider  acetaminophen (TYLENOL) 500 MG tablet Take 1 tablet (500 mg total) by mouth every 6 (six) hours as needed for moderate pain. 11/07/18   Raiford Noble Latif, DO  calcium carbonate (OSCAL) 1500 (600 Ca) MG TABS tablet Take 1,500 mg by mouth 2 (two) times daily  with a meal.    [provider]  diclofenac sodium (VOLTAREN) 1 % GEL Apply 4 g topically 4 (four) times daily. 11/07/18   Raiford Noble Latif, DO  HYDROcodone-acetaminophen (NORCO/VICODIN) 5-325 MG tablet Take 1-2 tablets by mouth every 4 (four) hours as needed for moderate pain. 11/07/18   Sheikh, Georgina Quint Latif, DO  lip balm (CARMEX) ointment Apply 1 application topically 2 (two) times daily. 11/07/18   Sheikh,  Omair Latif, DO  losartan-hydrochlorothiazide (HYZAAR) 50-12.5 MG per tablet Take 1 tablet by mouth every morning.     [provider]  lovastatin (MEVACOR) 20 MG tablet Take 20 mg by mouth at bedtime.    [provider]  metFORMIN (GLUCOPHAGE) 500 MG tablet Take 500 mg by mouth 2 (two) times daily.    [provider]  nystatin (MYCOSTATIN/NYSTOP) powder Apply topically 2 (two) times daily. 11/07/18   Sheikh, Georgina Quint Latif, DO  Omega-3 Fatty Acids (FISH OIL PO) Take 2 capsules by mouth daily.    [provider]    Physical Exam: Vitals:   10/09/19 1443 10/09/19 1545 10/09/19 1629 10/09/19 1753  BP: (!) 158/65 (!) 153/59 (!) 159/70 (!) 165/72  Pulse: 92 81 84   Resp: (!) 21 (!) 24 (!) 23 20  Temp:    98.2 F (36.8 C)  TempSrc:    Oral  SpO2: 96% 96% 97% 97%  Weight:      Height:        Constitutional: NAD, calm, comfortable Vitals:   10/09/19 1443 10/09/19 1545 10/09/19 1629 10/09/19 1753  BP: (!) 158/65 (!) 153/59 (!) 159/70 (!) 165/72  Pulse: 92 81 84   Resp: (!) 21 (!) 24 (!) 23 20  Temp:    98.2 F (36.8 C)  TempSrc:    Oral  SpO2: 96% 96% 97% 97%  Weight:      Height:       General:  Pleasantly resting in bed, No acute distress. HEENT:  Normocephalic atraumatic.  Sclerae nonicteric, noninjected.  Extraocular movements intact bilaterally. Neck:  Without mass or deformity.  Trachea is midline. Lungs:  Clear to auscultate bilaterally without rhonchi, wheeze, or rales. Heart:  Regular rate and rhythm.  Without murmurs, rubs, or gallops. Abdomen:  Soft, nontender, nondistended.  Without guarding or rebound. Extremities: 2+ pitting edema bilateral lower extremities.. Vascular:  Dorsalis pedis and posterior tibial pulses palpable bilaterally. Skin:  Warm and dry, no erythema, no ulcerations.  Labs on Admission: I have personally reviewed following labs and imaging studies  CBC: Recent Labs  Lab 10/09/19 1302  WBC 6.3  NEUTROABS 4.0    HGB 12.9  HCT 40.4  MCV 96.9  PLT [id number]   Basic Metabolic Panel: Recent Labs  Lab 10/09/19 1302  NA 137  K 6.0*  CL 104  CO2 24  GLUCOSE 91  BUN 43*  CREATININE 2.04*  CALCIUM 9.5   GFR: Estimated Creatinine Clearance: 27.8 mL/min (A) (by C-G formula based on SCr of 2.04 mg/dL (H)). Liver Function Tests: Recent Labs  Lab 10/09/19 1302  AST 17  ALT 12  ALKPHOS 60  BILITOT 0.5  PROT 7.6  ALBUMIN 3.8   No results for input(s): LIPASE, AMYLASE in the last 168 hours. No results for input(s): AMMONIA in the last 168 hours. Coagulation Profile: No results for input(s): INR, PROTIME in the last 168 hours. Cardiac Enzymes: No results for input(s): CKTOTAL, CKMB, CKMBINDEX, TROPONINI in the last 168 hours. BNP (last 3 results) No results for input(s):  PROBNP in the last 8760 hours. HbA1C: No results for input(s): HGBA1C in the last 72 hours. CBG: Recent Labs  Lab 10/09/19 1544  GLUCAP 100*   Lipid Profile: No results for input(s): CHOL, HDL, LDLCALC, TRIG, CHOLHDL, LDLDIRECT in the last 72 hours. Thyroid Function Tests: Recent Labs    10/09/19 1302  TSH 5.135*   Anemia Panel: No results for input(s): VITAMINB12, FOLATE, FERRITIN, TIBC, IRON, RETICCTPCT in the last 72 hours. Urine analysis:    Component Value Date/Time   COLORURINE YELLOW 10/09/2019 1517   APPEARANCEUR CLEAR 10/09/2019 1517   LABSPEC 1.010 10/09/2019 1517   PHURINE 6.0 10/09/2019 1517   GLUCOSEU NEGATIVE 10/09/2019 1517   HGBUR TRACE (A) 10/09/2019 1517   BILIRUBINUR NEGATIVE 10/09/2019 [place] NEGATIVE 10/09/2019 1517   PROTEINUR 30 (A) 10/09/2019 1517   NITRITE NEGATIVE 10/09/2019 1517   LEUKOCYTESUR NEGATIVE 10/09/2019 1517    Radiological Exams on Admission: DG Chest 2 View  Result Date: 10/09/2019 CLINICAL DATA:  Shortness of breath for 2 weeks. EXAM: CHEST - 2 VIEW COMPARISON:  Single-view of the chest 11/03/2018. CT chest 04/29/2017. FINDINGS: Lung volumes are low with  crowding of the bronchovascular structures. There is pulmonary vascular congestion. No consolidative process, pneumothorax or effusion. Heart size is normal. Atherosclerosis noted. IMPRESSION: Pulmonary vascular congestion. Atherosclerosis. Electronically Signed   By: Inge Rise M.D.   On: 10/09/2019 13:23   US Venous Img Lower Bilateral (DVT)  Result Date: 10/09/2019 CLINICAL DATA:  85 year old female with bilateral leg pain and redness, shortness of breath. EXAM: BILATERAL LOWER EXTREMITY VENOUS DOPPLER ULTRASOUND TECHNIQUE: Gray-scale sonography with graded compression, as well as color Doppler and duplex ultrasound were performed to evaluate the lower extremity deep venous systems from the level of the common femoral vein and including the common femoral, femoral, profunda femoral, popliteal and calf veins including the posterior tibial, peroneal and gastrocnemius veins when visible. The superficial great saphenous vein was also interrogated. Spectral Doppler was utilized to evaluate flow at rest and with distal augmentation maneuvers in the common femoral, femoral and popliteal veins. COMPARISON:  06/26/2019 left lower extremity Doppler. FINDINGS: RIGHT LOWER EXTREMITY Common Femoral Vein: No evidence of thrombus. Normal compressibility, respiratory phasicity and response to augmentation. Saphenofemoral Junction: No evidence of thrombus. Normal compressibility and flow on color Doppler imaging. Profunda Femoral Vein: No evidence of thrombus. Normal compressibility and flow on color Doppler imaging. Femoral Vein: No evidence of thrombus. Normal compressibility, respiratory phasicity and response to augmentation. Popliteal Vein: No evidence of thrombus. Normal compressibility, respiratory phasicity and response to augmentation. Calf Veins: Limited evaluation. Other Findings:  Subcutaneous edema throughout the calf. LEFT LOWER EXTREMITY Common Femoral Vein: No evidence of thrombus. Normal compressibility,  respiratory phasicity and response to augmentation. Saphenofemoral Junction: No evidence of thrombus. Normal compressibility and flow on color Doppler imaging. Profunda Femoral Vein: No evidence of thrombus. Normal compressibility and flow on color Doppler imaging. Femoral Vein: No evidence of thrombus. Normal compressibility, respiratory phasicity and response to augmentation. Popliteal Vein: No evidence of thrombus. Normal compressibility, respiratory phasicity and response to augmentation. Calf Veins: Limited evaluation. Other Findings:  Subcutaneous edema throughout the calf. IMPRESSION: No deep venous thrombus identified in the bilateral lower extremities. Limited evaluation of the calf veins due to soft tissue edema and body habitus. Electronically Signed   By: Genevie Ann M.D.   On: 10/09/2019 15:51    Assessment/Plan Active Problems:   Pulmonary edema   Acute respiratory failure with hypoxia ([place])  Acute hypoxic respiratory failure in the setting of volume overload, rule out acute heart failure, POA Unclear etiology, no echo since 2015 available, will order new echo to rule out heart failure Given improvement in the past with Lasix we will continue at this time with 40 IV 3 times daily, follow creatinine closely as below -adjust based on creatinine and I's and O's Continue to wean oxygen as tolerated, chest x-ray personally reviewed -poor inspiratory effort but generally prominent vasculature but otherwise unremarkable for any opacification concerning for infiltrate or atelectasis, especially in light of patient remaining afebrile without leukocytosis Strict I's and O's, low-salt fluid restricted diet Bilateral lower extremity DVT study negative at [facility]  Mild hyperkalemia  Potassium 6.0 at intake, already received Lasix, calcium gluconate, dextrose, 5 units of Novolin at previous facility  Given patient reports being on Lasix would suspect patient also on potassium supplementation  It does  not appear to be on med rec, will need to confirm with pharmacy  Will likely downtrend with appropriate diuresis as above  HTN, essential Continue home medications once med rec verified  HLD Continue home medications once med rec verified  Mild AKI on CKD 3a Baseline appears to be around 1.5-1.6 per chart review Likely elevated creatinine in the setting of volume overload, follow closely given diuresis as above Lab Results  Component Value Date   CREATININE 2.04 (H) 10/09/2019   CREATININE 1.56 (H) 11/07/2018   CREATININE 1.73 (H) 11/06/2018   Morbid obesity Body mass index is 51.55 kg/m. Lengthy discussion about need for dietary and lifestyle changes  Noninsulin-dependent diabetes type 2 We will hold patient's home p.o. medications in the setting of AKI and need for diuresis Continue sliding scale insulin hypoglycemic protocol  DVT prophylaxis: Lovenox Code Status: DNR  Disposition Plan: Pending clinical improvement, likely discharge back home Admission status: Inpatient - continues to require supplemental oxgyen well above baseline, IV lasix and close monitoring   [facility]  If 7PM-7AM, please contact night-coverage [email or web address]  10/09/2019, 5:59 PM

## 2019-10-09 NOTE — ED Notes (Signed)
US bedside

## 2019-10-10 ENCOUNTER — Inpatient Hospital Stay (HOSPITAL_COMMUNITY): Payer: Medicare HMO

## 2019-10-10 DIAGNOSIS — J9601 Acute respiratory failure with hypoxia: Secondary | ICD-10-CM

## 2019-10-10 LAB — COMPREHENSIVE METABOLIC PANEL
ALT: 14 U/L (ref 0–44)
AST: 21 U/L (ref 15–41)
Albumin: 3.5 g/dL (ref 3.5–5.0)
Alkaline Phosphatase: 53 U/L (ref 38–126)
Anion gap: 11 (ref 5–15)
BUN: 42 mg/dL — ABNORMAL HIGH (ref 8–23)
CO2: 26 mmol/L (ref 22–32)
Calcium: 9.3 mg/dL (ref 8.9–10.3)
Chloride: 105 mmol/L (ref 98–111)
Creatinine, Ser: 2.1 mg/dL — ABNORMAL HIGH (ref 0.44–1.00)
GFR calc Af Amer: 25 mL/min — ABNORMAL LOW (ref >60)
GFR calc non Af Amer: 21 mL/min — ABNORMAL LOW (ref >60)
Glucose, Bld: 90 mg/dL (ref 70–99)
Potassium: 5.6 mmol/L — ABNORMAL HIGH (ref 3.5–5.1)
Sodium: 142 mmol/L (ref 135–145)
Total Bilirubin: 0.7 mg/dL (ref 0.3–1.2)
Total Protein: 6.9 g/dL (ref 6.5–8.1)

## 2019-10-10 LAB — CBC
HCT: 40 % (ref 36.0–46.0)
Hemoglobin: 12.8 g/dL (ref 12.0–15.0)
MCH: 30.6 pg (ref 26.0–34.0)
MCHC: 32 g/dL (ref 30.0–36.0)
MCV: 95.7 fL (ref 80.0–100.0)
Platelets: 190 10*3/uL (ref 150–400)
RBC: 4.18 MIL/uL (ref 3.87–5.11)
RDW: 13.9 % (ref 11.5–15.5)
WBC: 6.7 10*3/uL (ref 4.0–10.5)
nRBC: 0 % (ref 0.0–0.2)

## 2019-10-10 LAB — GLUCOSE, CAPILLARY
Glucose-Capillary: 103 mg/dL — ABNORMAL HIGH (ref 70–99)
Glucose-Capillary: 103 mg/dL — ABNORMAL HIGH (ref 70–99)
Glucose-Capillary: 115 mg/dL — ABNORMAL HIGH (ref 70–99)
Glucose-Capillary: 81 mg/dL (ref 70–99)
Glucose-Capillary: 83 mg/dL (ref 70–99)

## 2019-10-10 LAB — ECHOCARDIOGRAM COMPLETE
Height: 63 in
Weight: 4656 oz

## 2019-10-10 MED ORDER — ACETAMINOPHEN 500 MG PO TABS
500.0000 mg | ORAL_TABLET | Freq: Four times a day (QID) | ORAL | Status: DC | PRN
  Administered 2019-10-10 (×2): 500 mg via ORAL
  Filled 2019-10-10 (×2): qty 1

## 2019-10-10 MED ORDER — MELATONIN 3 MG PO TABS
6.0000 mg | ORAL_TABLET | Freq: Every day | ORAL | Status: DC
  Administered 2019-10-10: 6 mg via ORAL
  Filled 2019-10-10: qty 2

## 2019-10-10 NOTE — Progress Notes (Signed)
Pts CBG was not showing up in computer, so asked NT what pts CBG reading was. NT said it was 202. Based on Novolog sliding scale gave pt 2 units SQ. NT informed this nurse that pts CBG was actually 103 and they had taken it after I had asked and already given pt the novolog. Gave pt 4oz OJ and offered snack. Notified MD. No new orders at this time. Will recheck CBG in a few hours and continue to monitor.

## 2019-10-10 NOTE — Progress Notes (Signed)
Progress Note   Pamela [name] [id number] DOB: Jun 01, 1935 DOA: 10/09/2019  PCP: Orpah Melter, MD  Chief Complaint: Shortness of breath  HPI: Pamela [name] is a 85 y.o. female with history of bilateral lower extremity edema but declines hearing the diagnosis "heart failure" previously, HTN, HLD, CKD 3, morbid obesity, Non-insulin dependent DM 2.  Patient presents today for 01/21/2020 to [place] after approximately 2 weeks of worsening dyspnea with exertion and orthopnea.  She indicates that previously was using a walker although only able to ambulate short distances at baseline, now essentially nonambulatory.  On evaluation at [facility] she was noted to be markedly hypoxic and denies ever being on or requiring oxygen in the past.  She denies any recent travel sick contacts, headache, fevers, chills, nausea, vomiting, diarrhea, chest pain.  She indicates that with long distance ambulation, which for her is 50 feet are greater, she has episodes of lightheadedness but not true vertigo or dizziness.  She has been placed on Lasix in the past for lower extremity edema but appears to have been taken off this medication or to stop this medication on her own in the past few weeks, unclear reasoning for this.  She has previously been on antibiotics for lower extremity cellulitis and that this is now resolved.  Patient was transferred to [facility] for further evaluation and treatment of what appears to be volume overload without clear etiology given no known history of heart failure or venous stasis.   Subjective: No acute issues or events overnight, patient's respiratory status, volume status appears to be drastically improving, patient's main complaint is ongoing weakness and pain in her legs as well as increased amount of urination.  She declines any chest pain, nausea, vomiting, diarrhea, constipation, headache, fevers, chills.  Assessment/Plan Active Problems:  Pulmonary edema   Acute respiratory failure with hypoxia (H[facility])   Acute hypoxic respiratory failure in the setting of volume overload, rule out acute heart failure, POA - Unclear etiology, no echo since 2015 available - New echocardiogram pending - Given improvement in the past with Lasix we will continue at this time with 40 IV 3 times daily, follow creatinine closely as below -adjust based on creatinine and I's and O's - Continue to wean oxygen as tolerated, chest x-ray personally reviewed -poor inspiratory effort but generally prominent vasculature but otherwise unremarkable for any opacification concerning for infiltrate or atelectasis, especially in light of patient remaining afebrile without leukocytosis - Strict I's and O's, low-salt fluid restricted diet  Intake/Output Summary (Last 24 hours) at 10/10/2019 1143 Last data filed at 10/10/2019 1021 Gross per 24 hour  Intake 502.22 ml  Output 2550 ml  Net -2047.78 ml  - Bilateral lower extremity DVT study negative at [facility] - Continue to wean oxygen as tolerated SpO2: 94 % O2 Flow Rate (L/min): 2 L/min  Mild hyperkalemia  - Potassium 6.0 at intake, improving with diuresis as expected, continue to follow with morning labs Lab Results  Component Value Date   NA 142 10/10/2019   K 5.6 (H) 10/10/2019   CL 105 10/10/2019   CO2 26 10/10/2019    HTN, essential Continue home medications once med rec verified  HLD Continue home medications once med rec verified  Mild AKI on CKD 3a Baseline appears to be around 1.5-1.6 per chart review Patient follows with Dr. Posey Pronto nephrology in the outpatient setting every 6 months Likely elevated creatinine in the setting of volume overload, follow closely given  diuresis as above Lab Results  Component Value Date   CREATININE 2.10 (H) 10/10/2019   CREATININE 2.04 (H) 10/09/2019   CREATININE 1.56 (H) 11/07/2018   Morbid obesity Body mass index is 51.55 kg/m. Lengthy discussion about need for  dietary and lifestyle changes  Noninsulin-dependent diabetes type 2 We will hold patient's home p.o. medications in the setting of AKI and need for diuresis Continue sliding scale insulin hypoglycemic protocol  DVT prophylaxis: Lovenox Code Status: DNR  Family updated: Husband at bedside Disposition Plan: Pending clinical improvement, likely discharge back home Admission status: Inpatient - continues to require supplemental oxgyen well above baseline, IV lasix and close monitoring   Physical Exam:  Vitals:   10/09/19 2115 10/10/19 0100 10/10/19 0531 10/10/19 1143  BP: (!) 153/67 (!) 165/70 (!) 152/63 (!) 154/67  Pulse: 79 80 88 84  Resp: 20 18 17 16   Temp: 98 F (36.7 C) 97.8 F (36.6 C) (!) 97.5 F (36.4 C) 98.1 F (36.7 C)  TempSrc: Oral Oral Oral Oral  SpO2: 97% 95% 98% 94%  Weight:      Height:       General:  Pleasantly resting in bed, No acute distress. HEENT:  Normocephalic atraumatic.  Sclerae nonicteric, noninjected.  Extraocular movements intact bilaterally. Neck:  Without mass or deformity.  Trachea is midline. Lungs:  Clear to auscultate bilaterally without rhonchi, wheeze, or rales. Heart:  Regular rate and rhythm.  Without murmurs, rubs, or gallops. Abdomen:  Soft, nontender, nondistended.  Without guarding or rebound. Extremities: 1+ pitting edema bilateral lower extremities.  Right greater than left. Vascular:  Dorsalis pedis and posterior tibial pulses palpable bilaterally. Skin:  Warm and dry, no erythema, no ulcerations.  Labs on Admission: I have personally reviewed following labs and imaging studies  CBC: Recent Labs  Lab 10/09/19 1302 10/10/19 0515  WBC 6.3 6.7  NEUTROABS 4.0  --   HGB 12.9 12.8  HCT 40.4 40.0  MCV 96.9 95.7  PLT 184 [id number]   Basic Metabolic Panel: Recent Labs  Lab 10/09/19 1302 10/10/19 0515  NA 137 142  K 6.0* 5.6*  CL 104 105  CO2 24 26  GLUCOSE 91 90  BUN 43* 42*  CREATININE 2.04* 2.10*  CALCIUM 9.5 9.3    GFR: Estimated Creatinine Clearance: 27 mL/min (A) (by C-G formula based on SCr of 2.1 mg/dL (H)). Liver Function Tests: Recent Labs  Lab 10/09/19 1302 10/10/19 0515  AST 17 21  ALT 12 14  ALKPHOS 60 53  BILITOT 0.5 0.7  PROT 7.6 6.9  ALBUMIN 3.8 3.5   No results for input(s): LIPASE, AMYLASE in the last 168 hours. No results for input(s): AMMONIA in the last 168 hours. Coagulation Profile: No results for input(s): INR, PROTIME in the last 168 hours. Cardiac Enzymes: No results for input(s): CKTOTAL, CKMB, CKMBINDEX, TROPONINI in the last 168 hours. BNP (last 3 results) No results for input(s): PROBNP in the last 8760 hours. HbA1C: No results for input(s): HGBA1C in the last 72 hours. CBG: Recent Labs  Lab 10/09/19 1544 10/09/19 2139 10/10/19 0731 10/10/19 1136  GLUCAP 100* 77 81 83   Lipid Profile: No results for input(s): CHOL, HDL, LDLCALC, TRIG, CHOLHDL, LDLDIRECT in the last 72 hours. Thyroid Function Tests: Recent Labs    10/09/19 1302  TSH 5.135*   Anemia Panel: No results for input(s): VITAMINB12, FOLATE, FERRITIN, TIBC, IRON, RETICCTPCT in the last 72 hours. Urine analysis:    Component Value Date/Time   COLORURINE YELLOW 10/09/2019  [place] 10/09/2019 1517   LABSPEC 1.010 10/09/2019 1517   PHURINE 6.0 10/09/2019 1517   GLUCOSEU NEGATIVE 10/09/2019 1517   HGBUR TRACE (A) 10/09/2019 1517   BILIRUBINUR NEGATIVE 10/09/2019 1517   KETONESUR NEGATIVE 10/09/2019 1517   PROTEINUR 30 (A) 10/09/2019 1517   NITRITE NEGATIVE 10/09/2019 1517   LEUKOCYTESUR NEGATIVE 10/09/2019 1517    Radiological Exams on Admission: DG Chest 2 View  Result Date: 10/09/2019 CLINICAL DATA:  Shortness of breath for 2 weeks. EXAM: CHEST - 2 VIEW COMPARISON:  Single-view of the chest 11/03/2018. CT chest 04/29/2017. FINDINGS: Lung volumes are low with crowding of the bronchovascular structures. There is pulmonary vascular congestion. No consolidative process,  pneumothorax or effusion. Heart size is normal. Atherosclerosis noted. IMPRESSION: Pulmonary vascular congestion. Atherosclerosis. Electronically Signed   By: Inge Rise M.D.   On: 10/09/2019 13:23   US Venous Img Lower Bilateral (DVT)  Result Date: 10/09/2019 CLINICAL DATA:  85 year old female with bilateral leg pain and redness, shortness of breath. EXAM: BILATERAL LOWER EXTREMITY VENOUS DOPPLER ULTRASOUND TECHNIQUE: Gray-scale sonography with graded compression, as well as color Doppler and duplex ultrasound were performed to evaluate the lower extremity deep venous systems from the level of the common femoral vein and including the common femoral, femoral, profunda femoral, popliteal and calf veins including the posterior tibial, peroneal and gastrocnemius veins when visible. The superficial great saphenous vein was also interrogated. Spectral Doppler was utilized to evaluate flow at rest and with distal augmentation maneuvers in the common femoral, femoral and popliteal veins. COMPARISON:  06/26/2019 left lower extremity Doppler. FINDINGS: RIGHT LOWER EXTREMITY Common Femoral Vein: No evidence of thrombus. Normal compressibility, respiratory phasicity and response to augmentation. Saphenofemoral Junction: No evidence of thrombus. Normal compressibility and flow on color Doppler imaging. Profunda Femoral Vein: No evidence of thrombus. Normal compressibility and flow on color Doppler imaging. Femoral Vein: No evidence of thrombus. Normal compressibility, respiratory phasicity and response to augmentation. Popliteal Vein: No evidence of thrombus. Normal compressibility, respiratory phasicity and response to augmentation. Calf Veins: Limited evaluation. Other Findings:  Subcutaneous edema throughout the calf. LEFT LOWER EXTREMITY Common Femoral Vein: No evidence of thrombus. Normal compressibility, respiratory phasicity and response to augmentation. Saphenofemoral Junction: No evidence of thrombus. Normal  compressibility and flow on color Doppler imaging. Profunda Femoral Vein: No evidence of thrombus. Normal compressibility and flow on color Doppler imaging. Femoral Vein: No evidence of thrombus. Normal compressibility, respiratory phasicity and response to augmentation. Popliteal Vein: No evidence of thrombus. Normal compressibility, respiratory phasicity and response to augmentation. Calf Veins: Limited evaluation. Other Findings:  Subcutaneous edema throughout the calf. IMPRESSION: No deep venous thrombus identified in the bilateral lower extremities. Limited evaluation of the calf veins due to soft tissue edema and body habitus. Electronically Signed   By: Genevie Ann M.D.   On: 10/09/2019 15:51     [place] [facility]  If 7PM-7AM, please contact night-coverage [email or web address]  10/10/2019, 11:43 AM

## 2019-10-10 NOTE — Progress Notes (Signed)
  Echocardiogram 2D Echocardiogram has been performed.  Pamela [name] A Jak Haggar 10/10/2019, 2:02 PM

## 2019-10-11 ENCOUNTER — Encounter (HOSPITAL_COMMUNITY): Payer: Self-pay | Admitting: Internal Medicine

## 2019-10-11 DIAGNOSIS — I5031 Acute diastolic (congestive) heart failure: Secondary | ICD-10-CM

## 2019-10-11 HISTORY — DX: Acute diastolic (congestive) heart failure: I50.31

## 2019-10-11 LAB — HEMOGLOBIN A1C
Hgb A1c MFr Bld: 5.6 % (ref 4.8–5.6)
Mean Plasma Glucose: 114 mg/dL

## 2019-10-11 LAB — COMPREHENSIVE METABOLIC PANEL
ALT: 13 U/L (ref 0–44)
AST: 17 U/L (ref 15–41)
Albumin: 3.7 g/dL (ref 3.5–5.0)
Alkaline Phosphatase: 52 U/L (ref 38–126)
Anion gap: 13 (ref 5–15)
BUN: 45 mg/dL — ABNORMAL HIGH (ref 8–23)
CO2: 29 mmol/L (ref 22–32)
Calcium: 9.5 mg/dL (ref 8.9–10.3)
Chloride: 99 mmol/L (ref 98–111)
Creatinine, Ser: 2.25 mg/dL — ABNORMAL HIGH (ref 0.44–1.00)
GFR calc Af Amer: 23 mL/min — ABNORMAL LOW (ref >60)
GFR calc non Af Amer: 20 mL/min — ABNORMAL LOW (ref >60)
Glucose, Bld: 112 mg/dL — ABNORMAL HIGH (ref 70–99)
Potassium: 4.8 mmol/L (ref 3.5–5.1)
Sodium: 141 mmol/L (ref 135–145)
Total Bilirubin: 0.6 mg/dL (ref 0.3–1.2)
Total Protein: 7.4 g/dL (ref 6.5–8.1)

## 2019-10-11 LAB — CBC
HCT: 40.6 % (ref 36.0–46.0)
Hemoglobin: 13 g/dL (ref 12.0–15.0)
MCH: 30.7 pg (ref 26.0–34.0)
MCHC: 32 g/dL (ref 30.0–36.0)
MCV: 95.8 fL (ref 80.0–100.0)
Platelets: 200 10*3/uL (ref 150–400)
RBC: 4.24 MIL/uL (ref 3.87–5.11)
RDW: 13.7 % (ref 11.5–15.5)
WBC: 8.5 10*3/uL (ref 4.0–10.5)
nRBC: 0 % (ref 0.0–0.2)

## 2019-10-11 LAB — GLUCOSE, CAPILLARY
Glucose-Capillary: 106 mg/dL — ABNORMAL HIGH (ref 70–99)
Glucose-Capillary: 100 mg/dL — ABNORMAL HIGH (ref 70–99)

## 2019-10-11 MED ORDER — FUROSEMIDE 10 MG/ML IJ SOLN: 40.0000 mg | Freq: Two times a day (BID) | INTRAMUSCULAR | Status: DC

## 2019-10-11 MED ORDER — FUROSEMIDE 20 MG PO TABS: 20.0000 mg | ORAL_TABLET | Freq: Two times a day (BID) | ORAL | 11 refills | Status: DC

## 2019-10-11 NOTE — Progress Notes (Signed)
Discharge instructions and medications reviewed. Educated on maintaining a cardiac diet. IV in Left arm discontinued without complication. VSS. No complaints of pain at this time. Following up with her PCP outpatient. Pt being discharged home with her husband.

## 2019-10-11 NOTE — Evaluation (Signed)
Occupational Therapy Evaluation Patient Details Name: Pamela [name] MRN: [id number] DOB: 18-Aug-1934 Today's Date: 10/11/2019    History of Present Illness 85 y.o. female with history of bilateral lower extremity edema but declines hearing the diagnosis "heart failure" previously, HTN, HLD, CKD 3, morbid obesity, Non-insulin dependent DM 2 and admitted for acute hypoxic respiratory failure in the setting of volume overload, rule out acute heart failure   Clinical Impression   PTA patient independent with ADLs, mobility using 3 wheeled walker. Admitted for above and limited by problem list below, including generalized weakness and decreased activity tolerance.  Patient in restroom upon entry, completing transfers from commode with supervision, grooming with supervision and in room mobility using rollator with supervision.  Once EOB engaged in LB ADls with supervision.  Discussed energy conservation techniques, importance of mobility and safety.  She is on RA with SpO2 at 93%.  Patient reports progressing towards her baseline level quickly and is eager to dc home today.  She has good support of spouse at home and verbalizes understanding with education provided.  No further OT needs have been identified at this time, OT will sign off.     Follow Up Recommendations  No OT follow up;Supervision - Intermittent    Equipment Recommendations  None recommended by OT    Recommendations for Other Services       Precautions / Restrictions Precautions Precautions: Fall Precaution Comments: monitor sats Restrictions Weight Bearing Restrictions: No      Mobility Bed Mobility Overal bed mobility: Needs Assistance Bed Mobility: Sit to Supine       Sit to supine: Supervision   General bed mobility comments: returned to supine with supervision, increased time and effort   Transfers Overall transfer level: Needs assistance Equipment used: 4-wheeled walker Transfers: Sit to/from Stand Sit to  Stand: Supervision         General transfer comment: patient in restroom upon entry, transferred from commode with supervision     Balance Overall balance assessment: Needs assistance Sitting-balance support: No upper extremity supported;Feet supported Sitting balance-Leahy Scale: Good     Standing balance support: Bilateral upper extremity supported;During functional activity Standing balance-Leahy Scale: Poor Standing balance comment: relaint on walker                            ADL either performed or assessed with clinical judgement   ADL Overall ADL's : Needs assistance/impaired     Grooming: Supervision/safety;Standing   Upper Body Bathing: Sitting;Set up   Lower Body Bathing: Supervison/ safety;Sit to/from stand   Upper Body Dressing : Supervision/safety;Sitting   Lower Body Dressing: Supervision/safety;Sit to/from stand   Toilet Transfer: Supervision/safety;Ambulation(rollator )   Toileting- Water quality scientist and Hygiene: Modified independent;Sit to/from stand       Functional mobility during ADLs: Supervision/safety(rollator) General ADL Comments: pt limited by decreased activity tolerance     Vision   Vision Assessment?: No apparent visual deficits     Perception     Praxis      Pertinent Vitals/Pain Pain Assessment: No/denies pain     Hand Dominance     Extremity/Trunk Assessment Upper Extremity Assessment Upper Extremity Assessment: Generalized weakness   Lower Extremity Assessment Lower Extremity Assessment: Defer to PT evaluation       Communication Communication Communication: No difficulties   Cognition Arousal/Alertness: Awake/alert Behavior During Therapy: WFL for tasks assessed/performed Overall Cognitive Status: Within Functional Limits for tasks assessed  General Comments  SpO2 on RA after exiting bathroom 93%; discussed energy conservation techniques and  safety      Exercises     Shoulder Instructions      Home Living Family/patient expects to be discharged to:: Private residence Living Arrangements: Spouse/significant other Available Help at Discharge: Family;Available 24 hours/day Type of Home: House Home Access: Stairs to enter [employer] of Steps: 1   Home Layout: One level     Bathroom Shower/Tub: Occupational psychologist: Handicapped height     Home Equipment: Unity - single point;Other (comment);Shower seat;Grab bars - tub/shower(3 wheeled walker with brakes )          Prior Functioning/Environment Level of Independence: Independent with assistive device(s)        Comments: uses 3 wheeled walker, independent with ADLs; spouse assists with IADLS         OT Problem List: Cardiopulmonary status limiting activity;Obesity;Decreased activity tolerance      OT Treatment/Interventions:      OT Goals(Current goals can be found in the care plan section) Acute Rehab OT Goals Patient Stated Goal: home today  OT Goal Formulation: With patient  OT Frequency:     Barriers to D/C:            Co-evaluation              AM-PAC OT "6 Clicks" Daily Activity     Outcome Measure Help from another person eating meals?: None Help from another person taking care of personal grooming?: A Little Help from another person toileting, which includes using toliet, bedpan, or urinal?: A Little Help from another person bathing (including washing, rinsing, drying)?: A Little Help from another person to put on and taking off regular upper body clothing?: A Little Help from another person to put on and taking off regular lower body clothing?: A Little 6 Click Score: 19   End of Session Equipment Utilized During Treatment: Other (comment)(rollator ) Nurse Communication: Mobility status  Activity Tolerance: Patient tolerated treatment well Patient left: in bed;with call bell/phone within reach  OT Visit  Diagnosis: Unsteadiness on feet (R26.81);Muscle weakness (generalized) (M62.81)                Time: [id number] OT Time Calculation (min): 13 min Charges:  OT General Charges $OT Visit: 1 Visit OT Evaluation $OT Eval Low Complexity: 1 Low  Jolaine Artist, OT Acute Rehabilitation Services Pager [phone] Office [phone]   Delight Stare 10/11/2019, 1:35 PM

## 2019-10-11 NOTE — Care Management Important Message (Signed)
Important Message  Patient Details IM Letter given to Dessa Phi RN Case Manager to present to the Patient Name: Pamela [name] MRN: [id number] Date of Birth: 1934/08/31   Medicare Important Message Given:  Yes     Kerin Salen 10/11/2019, 10:56 AM

## 2019-10-11 NOTE — Discharge Summary (Signed)
Physician Discharge Summary  Pamela [name] [id number] DOB: 1935-02-28 DOA: 10/09/2019  PCP: Orpah Melter, MD  Admit date: 10/09/2019 Discharge date: 10/11/2019  Admitted From: Home Disposition: Home  Recommendations for Outpatient Follow-up:  1. Follow up with PCP in 1-2 weeks 2. Please obtain BMP/CBC in one week  Home Health: Not indicated Equipment/Devices: None required  Discharge Condition: Stable CODE STATUS: DNR Diet recommendation: Low salt fluid restricted 1200 cc daily low-fat diet.  Brief/Interim Summary: Pamela [name] is a 85 y.o. female with history of bilateral lower extremity edema but declines hearing the diagnosis "heart failure" previously, HTN, HLD, CKD 3, morbid obesity, Non-insulin dependent DM 2.  Patient presents today for 01/21/2020 to [place] after approximately 2 weeks of worsening dyspnea with exertion and orthopnea.  She indicates that previously was using a walker although only able to ambulate short distances at baseline, now essentially nonambulatory.  On evaluation at [facility] she was noted to be markedly hypoxic and denies ever being on or requiring oxygen in the past.  She denies any recent travel sick contacts, headache, fevers, chills, nausea, vomiting, diarrhea, chest pain.  She indicates that with long distance ambulation, which for her is 50 feet are greater, she has episodes of lightheadedness but not true vertigo or dizziness.  She has been placed on Lasix in the past for lower extremity edema but appears to have been taken off this medication or to stop this medication on her own in the past few weeks, unclear reasoning for this.  She has previously been on antibiotics for lower extremity cellulitis and that this is now resolved. Patient was transferred to [facility] for further evaluation and treatment of what appears to be volume overload without clear etiology given no known history of heart failure or venous  stasis.   Patient admitted as above with acute volume overload and acute hypoxic respiratory failure likely in the setting of newly diagnosed heart failure with diastolic dysfunction. Patient notes having profound edema for weeks, possibly longer and was being treated with diuretics in the outpatient setting. Patient was confirmed to have diastolic dysfunction on echocardiogram here, she has diuresed very well, creatinine remained stable, patient now off oxygen able to ambulate without hypoxia today, her ambulatory status is markedly improved, so much so that PT and OT did not recommend any further outpatient follow-up. We do recommend she continue to use her walker at home and continue to increase ambulation as tolerated. She will need close follow-up with PCP in the next 1 to 2 weeks for further evaluation and treatment, if reasonable she should also follow with heart failure specialist to ensure appropriate medications and to continue to monitor her daily weights and volume as well as low-salt diet as discussed at length with her and her husband at bedside today and yesterday.  Discharge Diagnoses:  Principal Problem:   Acute diastolic CHF (congestive heart failure) (HCC) Active Problems:   Pulmonary edema   Acute respiratory failure with hypoxia [facility])    Discharge Instructions  Discharge Instructions    (HEART FAILURE PATIENTS) Call MD:  Anytime you have any of the following symptoms: 1) 3 pound weight gain in 24 hours or 5 pounds in 1 week 2) shortness of breath, with or without a dry hacking cough 3) swelling in the hands, feet or stomach 4) if you have to sleep on extra pillows at night in order to breathe.   Complete by: As directed  AMB referral to CHF clinic   Complete by: As directed    Call MD for:  difficulty breathing, headache or visual disturbances   Complete by: As directed    Call MD for:  extreme fatigue   Complete by: As directed    Call MD for:  hives   Complete by: As  directed    Call MD for:  persistant dizziness or light-headedness   Complete by: As directed    Call MD for:  persistant nausea and vomiting   Complete by: As directed    Call MD for:  severe uncontrolled pain   Complete by: As directed    Call MD for:  temperature >100.4   Complete by: As directed    Diet - low sodium heart healthy   Complete by: As directed    Increase activity slowly   Complete by: As directed      Allergies as of 10/11/2019      Reactions   Levaquin [levofloxacin In D5w] Nausea And Vomiting      Medication List    STOP taking these medications   diclofenac sodium 1 % Gel Commonly known as: VOLTAREN   HYDROcodone-acetaminophen 5-325 MG tablet Commonly known as: NORCO/VICODIN   nystatin powder Commonly known as: MYCOSTATIN/NYSTOP     TAKE these medications   acetaminophen 500 MG tablet Commonly known as: TYLENOL Take 1 tablet (500 mg total) by mouth every 6 (six) hours as needed for moderate pain.   Align 4 MG Caps Take 4 mg by mouth daily.   calcium carbonate 1500 (600 Ca) MG Tabs tablet Commonly known as: OSCAL Take 1,500 mg by mouth 2 (two) times daily with a meal.   FISH OIL PO Take 2 capsules by mouth daily.   furosemide 20 MG tablet Commonly known as: Lasix Take 1 tablet (20 mg total) by mouth 2 (two) times daily.   GARLIC HIGH POTENCY PO Take 1 capsule by mouth daily.   lip balm ointment Apply 1 application topically 2 (two) times daily.   losartan 50 MG tablet Commonly known as: COZAAR Take 50 mg by mouth daily.   lovastatin 20 MG tablet Commonly known as: MEVACOR Take 20 mg by mouth at bedtime.       Allergies  Allergen Reactions  . Levaquin [Levofloxacin In D5w] Nausea And Vomiting    Procedures/Studies: DG Chest 2 View  Result Date: 10/09/2019 CLINICAL DATA:  Shortness of breath for 2 weeks. EXAM: CHEST - 2 VIEW COMPARISON:  Single-view of the chest 11/03/2018. CT chest 04/29/2017. FINDINGS: Lung volumes are low  with crowding of the bronchovascular structures. There is pulmonary vascular congestion. No consolidative process, pneumothorax or effusion. Heart size is normal. Atherosclerosis noted. IMPRESSION: Pulmonary vascular congestion. Atherosclerosis. Electronically Signed   By: Inge Rise M.D.   On: 10/09/2019 13:23   US Venous Img Lower Bilateral (DVT)  Result Date: 10/09/2019 CLINICAL DATA:  85 year old female with bilateral leg pain and redness, shortness of breath. EXAM: BILATERAL LOWER EXTREMITY VENOUS DOPPLER ULTRASOUND TECHNIQUE: Gray-scale sonography with graded compression, as well as color Doppler and duplex ultrasound were performed to evaluate the lower extremity deep venous systems from the level of the common femoral vein and including the common femoral, femoral, profunda femoral, popliteal and calf veins including the posterior tibial, peroneal and gastrocnemius veins when visible. The superficial great saphenous vein was also interrogated. Spectral Doppler was utilized to evaluate flow at rest and with distal augmentation maneuvers in the common femoral, femoral and  popliteal veins. COMPARISON:  06/26/2019 left lower extremity Doppler. FINDINGS: RIGHT LOWER EXTREMITY Common Femoral Vein: No evidence of thrombus. Normal compressibility, respiratory phasicity and response to augmentation. Saphenofemoral Junction: No evidence of thrombus. Normal compressibility and flow on color Doppler imaging. Profunda Femoral Vein: No evidence of thrombus. Normal compressibility and flow on color Doppler imaging. Femoral Vein: No evidence of thrombus. Normal compressibility, respiratory phasicity and response to augmentation. Popliteal Vein: No evidence of thrombus. Normal compressibility, respiratory phasicity and response to augmentation. Calf Veins: Limited evaluation. Other Findings:  Subcutaneous edema throughout the calf. LEFT LOWER EXTREMITY Common Femoral Vein: No evidence of thrombus. Normal  compressibility, respiratory phasicity and response to augmentation. Saphenofemoral Junction: No evidence of thrombus. Normal compressibility and flow on color Doppler imaging. Profunda Femoral Vein: No evidence of thrombus. Normal compressibility and flow on color Doppler imaging. Femoral Vein: No evidence of thrombus. Normal compressibility, respiratory phasicity and response to augmentation. Popliteal Vein: No evidence of thrombus. Normal compressibility, respiratory phasicity and response to augmentation. Calf Veins: Limited evaluation. Other Findings:  Subcutaneous edema throughout the calf. IMPRESSION: No deep venous thrombus identified in the bilateral lower extremities. Limited evaluation of the calf veins due to soft tissue edema and body habitus. Electronically Signed   By: Genevie Ann M.D.   On: 10/09/2019 15:51   ECHOCARDIOGRAM COMPLETE  Result Date: 10/10/2019    ECHOCARDIOGRAM REPORT   Patient Name:   LURLEAN BATTISTELLI Date of Exam: 10/10/2019 Medical Rec #:  [id number]      Height:       63.0 in Accession #:    [id number]     Weight:       291.0 lb Date of Birth:  12-27-1934       BSA:          2.268 m Patient Age:    56 years       BP:           154/67 mmHg Patient Gender: F              HR:           84 bpm. Exam Location:  Inpatient Procedure: 2D Echo Indications:    Acute Respiratory Insufficiency 518.82 / R06.89  History:        Patient has prior history of Echocardiogram examinations, most                 recent 09/19/2013. Risk Factors:Diabetes, Dyslipidemia, Morbid                 obesity and Hypertension. Chronic kidney disease.  Sonographer:    Vikki Ports Turrentine Referring Phys: [id number] [place]  1. Left ventricular ejection fraction, by estimation, is 60 to 65%. The left ventricle has normal function. The left ventricle has no regional wall motion abnormalities. Left ventricular diastolic parameters are consistent with Grade I diastolic dysfunction (impaired relaxation).  2.  Right ventricular systolic function is normal. The right ventricular size is normal.  3. The mitral valve is normal in structure. No evidence of mitral valve regurgitation. No evidence of mitral stenosis.  4. The aortic valve is normal in structure. Aortic valve regurgitation is not visualized. No aortic stenosis is present.  5. The inferior vena cava is normal in size with greater than 50% respiratory variability, suggesting right atrial pressure of 3 mmHg. FINDINGS  Left Ventricle: Left ventricular ejection fraction, by estimation, is 60 to 65%. The left ventricle has normal function. The left ventricle has  no regional wall motion abnormalities. The left ventricular internal cavity size was normal in size. There is  no left ventricular hypertrophy. Abnormal (paradoxical) septal motion, consistent with left bundle branch block. Left ventricular diastolic parameters are consistent with Grade I diastolic dysfunction (impaired relaxation). Right Ventricle: The right ventricular size is normal. No increase in right ventricular wall thickness. Right ventricular systolic function is normal. Left Atrium: Left atrial size was normal in size. Right Atrium: Right atrial size was normal in size. Pericardium: There is no evidence of pericardial effusion. Mitral Valve: The mitral valve is normal in structure. Normal mobility of the mitral valve leaflets. Mild mitral annular calcification. No evidence of mitral valve regurgitation. No evidence of mitral valve stenosis. Tricuspid Valve: The tricuspid valve is normal in structure. Tricuspid valve regurgitation is not demonstrated. No evidence of tricuspid stenosis. Aortic Valve: The aortic valve is normal in structure. Aortic valve regurgitation is not visualized. No aortic stenosis is present. Pulmonic Valve: The pulmonic valve was normal in structure. Pulmonic valve regurgitation is not visualized. No evidence of pulmonic stenosis. Aorta: The aortic root is normal in size and  structure. Venous: The inferior vena cava is normal in size with greater than 50% respiratory variability, suggesting right atrial pressure of 3 mmHg. IAS/Shunts: No atrial level shunt detected by color flow Doppler.  LEFT VENTRICLE PLAX 2D LVIDd:         4.90 cm  Diastology LVIDs:         2.80 cm  LV e' lateral:   10.30 cm/s LV PW:         1.20 cm  LV E/e' lateral: 9.3 LV IVS:        1.20 cm  LV e' medial:    7.83 cm/s LVOT diam:     2.20 cm  LV E/e' medial:  12.2 LV SV:         81 LV SV Index:   36 LVOT Area:     3.80 cm  RIGHT VENTRICLE RV S prime:     14.00 cm/s TAPSE (M-mode): 2.4 cm LEFT ATRIUM             Index       RIGHT ATRIUM           Index LA diam:        4.10 cm 1.81 cm/m  RA Area:     15.10 cm LA Vol (A2C):   48.8 ml 21.52 ml/m RA Volume:   35.10 ml  15.48 ml/m LA Vol (A4C):   60.9 ml 26.85 ml/m LA Biplane Vol: 57.3 ml 25.27 ml/m  AORTIC VALVE LVOT Vmax:   117.00 cm/s LVOT Vmean:  87.400 cm/s LVOT VTI:    0.212 m  AORTA Ao Root diam: 3.10 cm MITRAL VALVE MV Area (PHT): 4.10 cm     SHUNTS MV Decel Time: 185 msec     Systemic VTI:  0.21 m MV E velocity: 95.40 cm/s   Systemic Diam: 2.20 cm MV A velocity: 138.00 cm/s MV E/A ratio:  0.69 Candee Furbish MD Electronically signed by Candee Furbish MD Signature Date/Time: 10/10/2019/4:00:24 PM    Final     Subjective: No acute issues or events overnight, patient now able to ambulate without hypoxia or dyspnea with exertion and requesting discharge home which is certainly reasonable given marked improvement in symptoms and hypoxia.  Denies nausea, vomiting, diarrhea, constipation, headache, fevers, chills.   Discharge Exam: Vitals:   10/11/19 1000 10/11/19 1330  BP:  (!) 156/87  Pulse: 87 98  Resp:  18  Temp:  98 F (36.7 C)  SpO2: 95% 93%   Vitals:   10/11/19 0504 10/11/19 0700 10/11/19 1000 10/11/19 1330  BP: (!) 150/70   (!) 156/87  Pulse: 86  87 98  Resp: (!) 21   18  Temp: 97.6 F (36.4 C)   98 F (36.7 C)  TempSrc: Oral   Oral   SpO2: 95% 96% 95% 93%  Weight:      Height:        General:  Pleasantly resting in bed, No acute distress. HEENT:  Normocephalic atraumatic.  Sclerae nonicteric, noninjected.  Extraocular movements intact bilaterally. Neck:  Without mass or deformity.  Trachea is midline. Lungs:  Clear to auscultate bilaterally without rhonchi, wheeze, or rales. Heart:  Regular rate and rhythm.  Without murmurs, rubs, or gallops. Abdomen:  Soft, nontender, nondistended.  Without guarding or rebound. Extremities: Without cyanosis, clubbing; 1+ pitting edema to the midcalf bilateral lower extremities with chronic skin changes. Vascular:  Dorsalis pedis and posterior tibial pulses palpable bilaterally. Skin:  Warm and dry, no erythema, no ulcerations.   The results of significant diagnostics from this hospitalization (including imaging, microbiology, ancillary and laboratory) are listed below for reference.     Microbiology: Recent Results (from the past 240 hour(s))  SARS CORONAVIRUS 2 (TAT 6-24 HRS) Nasopharyngeal Nasopharyngeal Swab     Status: None   Collection Time: 10/09/19  2:35 PM   Specimen: Nasopharyngeal Swab  Result Value Ref Range Status   SARS Coronavirus 2 NEGATIVE NEGATIVE Final    Comment: (NOTE) SARS-CoV-2 target nucleic acids are NOT DETECTED. The SARS-CoV-2 RNA is generally detectable in upper and lower respiratory specimens during the acute phase of infection. Negative results do not preclude SARS-CoV-2 infection, do not rule out co-infections with other pathogens, and should not be used as the sole basis for treatment or other patient management decisions. Negative results must be combined with clinical observations, patient history, and epidemiological information. The expected result is Negative. Fact Sheet for Patients: SugarRoll.be Fact Sheet for Healthcare Providers: https://www.woods-mathews.com/ This test is not yet approved  or cleared by the [place] FDA and  has been authorized for detection and/or diagnosis of SARS-CoV-2 by FDA under an Emergency Use Authorization (EUA). This EUA will remain  in effect (meaning this test can be used) for the duration of the COVID-19 declaration under Section 56 4(b)(1) of the Act, 21 U.S.C. section 360bbb-3(b)(1), unless the authorization is terminated or revoked sooner. Performed at [facility], [place] [address] [place], [place] [address]      Labs: BNP (last 3 results) Recent Labs    10/09/19 1302  BNP [id number]   Basic Metabolic Panel: Recent Labs  Lab 10/09/19 1302 10/10/19 0515 10/11/19 0514  NA 137 142 141  K 6.0* 5.6* 4.8  CL 104 105 99  CO2 24 26 29   GLUCOSE 91 90 112*  BUN 43* 42* 45*  CREATININE 2.04* 2.10* 2.25*  CALCIUM 9.5 9.3 9.5   Liver Function Tests: Recent Labs  Lab 10/09/19 1302 10/10/19 0515 10/11/19 0514  AST 17 21 17   ALT 12 14 13   ALKPHOS 60 53 52  BILITOT 0.5 0.7 0.6  PROT 7.6 6.9 7.4  ALBUMIN 3.8 3.5 3.7   No results for input(s): LIPASE, AMYLASE in the last 168 hours. No results for input(s): AMMONIA in the last 168 hours. CBC: Recent Labs  Lab 10/09/19 1302 10/10/19 0515 10/11/19 0514  WBC  6.3 6.7 8.5  NEUTROABS 4.0  --   --   HGB 12.9 12.8 13.0  HCT 40.4 40.0 40.6  MCV 96.9 95.7 95.8  PLT 184 190 200   Cardiac Enzymes: No results for input(s): CKTOTAL, CKMB, CKMBINDEX, TROPONINI in the last 168 hours. BNP: Invalid input(s): POCBNP CBG: Recent Labs  Lab 10/10/19 1615 10/10/19 2208 10/10/19 2353 10/11/19 0732 10/11/19 1124  GLUCAP 115* 103* 103* 106* 100*   D-Dimer No results for input(s): DDIMER in the last 72 hours. Hgb A1c Recent Labs    10/09/19 1947  HGBA1C 5.6   Lipid Profile No results for input(s): CHOL, HDL, LDLCALC, TRIG, CHOLHDL, LDLDIRECT in the last 72 hours. Thyroid function studies Recent Labs    10/09/19 1302  TSH 5.135*   Anemia work up No results for input(s):  VITAMINB12, FOLATE, FERRITIN, TIBC, IRON, RETICCTPCT in the last 72 hours. Urinalysis    Component Value Date/Time   COLORURINE YELLOW 10/09/2019 1517   APPEARANCEUR CLEAR 10/09/2019 1517   LABSPEC 1.010 10/09/2019 1517   PHURINE 6.0 10/09/2019 1517   GLUCOSEU NEGATIVE 10/09/2019 1517   HGBUR TRACE (A) 10/09/2019 1517   BILIRUBINUR NEGATIVE 10/09/2019 1517   [place] 10/09/2019 1517   PROTEINUR 30 (A) 10/09/2019 1517   NITRITE NEGATIVE 10/09/2019 1517   LEUKOCYTESUR NEGATIVE 10/09/2019 1517   Sepsis Labs Invalid input(s): PROCALCITONIN,  WBC,  LACTICIDVEN Microbiology Recent Results (from the past 240 hour(s))  SARS CORONAVIRUS 2 (TAT 6-24 HRS) Nasopharyngeal Nasopharyngeal Swab     Status: None   Collection Time: 10/09/19  2:35 PM   Specimen: Nasopharyngeal Swab  Result Value Ref Range Status   SARS Coronavirus 2 NEGATIVE NEGATIVE Final    Comment: (NOTE) SARS-CoV-2 target nucleic acids are NOT DETECTED. The SARS-CoV-2 RNA is generally detectable in upper and lower respiratory specimens during the acute phase of infection. Negative results do not preclude SARS-CoV-2 infection, do not rule out co-infections with other pathogens, and should not be used as the sole basis for treatment or other patient management decisions. Negative results must be combined with clinical observations, patient history, and epidemiological information. The expected result is Negative. Fact Sheet for Patients: SugarRoll.be Fact Sheet for Healthcare Providers: https://www.woods-mathews.com/ This test is not yet approved or cleared by the [place] FDA and  has been authorized for detection and/or diagnosis of SARS-CoV-2 by FDA under an Emergency Use Authorization (EUA). This EUA will remain  in effect (meaning this test can be used) for the duration of the COVID-19 declaration under Section 56 4(b)(1) of the Act, 21 U.S.C. section  360bbb-3(b)(1), unless the authorization is terminated or revoked sooner. Performed at [facility], [place] [address] [place], [place] [address]      Time coordinating discharge: Over 30 minutes  SIGNED:   Little Ishikawa, DO [facility] 10/11/2019, 2:41 PM Pager   If 7PM-7AM, please contact night-coverage [email or web address]

## 2019-10-11 NOTE — Evaluation (Signed)
Physical Therapy Evaluation Patient Details Name: Pamela [name] MRN: [id number] DOB: [birth year] Today's Date: 10/11/2019   History of Present Illness  85 y.o. female with history of bilateral lower extremity edema but declines hearing the diagnosis "heart failure" previously, HTN, HLD, CKD 3, morbid obesity, Non-insulin dependent DM 2 and admitted for acute hypoxic respiratory failure in the setting of volume overload, rule out acute heart failure  Clinical Impression  Pt admitted with above diagnosis.  Pt currently with functional limitations due to the deficits listed below (see PT Problem List). Pt will benefit from skilled PT to increase their independence and safety with mobility to allow discharge to the venue listed below.  Pt assisted with ambulating short distance with provided rollator (did not like RW, used to using 3 wheeled walker at baseline) and SPO2 dropped to 86% on room air.  Pt encouraged to ambulate a few more times today with nursing to continue monitoring saturations as pt is eager to d/c home as soon as possible.     Follow Up Recommendations No PT follow up    Equipment Recommendations  None recommended by PT    Recommendations for Other Services       Precautions / Restrictions Precautions Precautions: Fall Precaution Comments: monitor sats      Mobility  Bed Mobility               General bed mobility comments: pt in recliner  Transfers Overall transfer level: Needs assistance Equipment used: 4-wheeled walker Transfers: Sit to/from Stand Sit to Stand: Min guard         General transfer comment: min/guard for safety  Ambulation/Gait Ambulation/Gait assistance: Min guard Gait Distance (Feet): 40 Feet Assistive device: 4-wheeled walker Gait Pattern/deviations: Step-through pattern;Decreased stride length     General Gait Details: slow but steady with walker, SPO2 dropped to 86% on room air however pt improved to 89-90% upon return to room  and sitting in recliner Investment banker, corporate notified)  Stairs            Wheelchair Mobility    Modified Rankin (Stroke Patients Only)       Balance Overall balance assessment: Needs assistance         Standing balance support: Bilateral upper extremity supported Standing balance-Leahy Scale: Poor                               Pertinent Vitals/Pain Pain Assessment: No/denies pain    Home Living Family/patient expects to be discharged to:: Private residence Living Arrangements: Spouse/significant other   Type of Home: House       Home Layout: One level Home Equipment: Cane - single point;Other (comment)(3 wheeled walker with brakes)      Prior Function Level of Independence: Independent with assistive device(s)         Comments: uses 3 wheeled walker     Hand Dominance        Extremity/Trunk Assessment        Lower Extremity Assessment Lower Extremity Assessment: Generalized weakness       Communication   Communication: No difficulties  Cognition Arousal/Alertness: Awake/alert Behavior During Therapy: WFL for tasks assessed/performed Overall Cognitive Status: Within Functional Limits for tasks assessed                                        General  Comments      Exercises     Assessment/Plan    PT Assessment Patient needs continued PT services  PT Problem List Decreased activity tolerance;Decreased strength;Decreased mobility;Decreased balance;Decreased knowledge of use of DME;Cardiopulmonary status limiting activity       PT Treatment Interventions DME instruction;Therapeutic activities;Gait training;Therapeutic exercise;Patient/family education;Functional mobility training;Balance training    PT Goals (Current goals can be found in the Care Plan section)  Acute Rehab PT Goals Patient Stated Goal: go home today (does not like hospital bed) PT Goal Formulation: With patient/family Time For Goal Achievement:  10/18/19 Potential to Achieve Goals: Good    Frequency Min 3X/week   Barriers to discharge        Co-evaluation               AM-PAC PT "6 Clicks" Mobility  Outcome Measure Help needed turning from your back to your side while in a flat bed without using bedrails?: A Little Help needed moving from lying on your back to sitting on the side of a flat bed without using bedrails?: A Little Help needed moving to and from a bed to a chair (including a wheelchair)?: A Little Help needed standing up from a chair using your arms (e.g., wheelchair or bedside chair)?: A Little Help needed to walk in hospital room?: A Little Help needed climbing 3-5 steps with a railing? : A Lot 6 Click Score: 17    End of Session   Activity Tolerance: Patient tolerated treatment well Patient left: in chair;with call bell/phone within reach;with family/visitor present Nurse Communication: Mobility status PT Visit Diagnosis: Difficulty in walking, not elsewhere classified (R26.2)    Time: [id number] PT Time Calculation (min) (ACUTE ONLY): 11 min   Charges:   PT Evaluation $PT Eval Low Complexity: 1 Low        Kati PT, DPT Acute Rehabilitation Services Office: [phone]   York Ram E 10/11/2019, 1:00 PM

## 2019-10-17 DIAGNOSIS — I5030 Unspecified diastolic (congestive) heart failure: Secondary | ICD-10-CM | POA: Diagnosis not present

## 2019-10-24 DIAGNOSIS — N2581 Secondary hyperparathyroidism of renal origin: Secondary | ICD-10-CM | POA: Diagnosis not present

## 2019-10-24 DIAGNOSIS — N183 Chronic kidney disease, stage 3 unspecified: Secondary | ICD-10-CM | POA: Diagnosis not present

## 2019-10-24 DIAGNOSIS — I129 Hypertensive chronic kidney disease with stage 1 through stage 4 chronic kidney disease, or unspecified chronic kidney disease: Secondary | ICD-10-CM | POA: Diagnosis not present

## 2019-10-24 DIAGNOSIS — D631 Anemia in chronic kidney disease: Secondary | ICD-10-CM | POA: Diagnosis not present

## 2019-11-07 DIAGNOSIS — N183 Chronic kidney disease, stage 3 unspecified: Secondary | ICD-10-CM | POA: Diagnosis not present

## 2019-11-07 DIAGNOSIS — I129 Hypertensive chronic kidney disease with stage 1 through stage 4 chronic kidney disease, or unspecified chronic kidney disease: Secondary | ICD-10-CM | POA: Diagnosis not present

## 2019-11-07 DIAGNOSIS — D631 Anemia in chronic kidney disease: Secondary | ICD-10-CM | POA: Diagnosis not present

## 2019-11-07 DIAGNOSIS — E1122 Type 2 diabetes mellitus with diabetic chronic kidney disease: Secondary | ICD-10-CM | POA: Diagnosis not present

## 2019-11-11 ENCOUNTER — Encounter: Payer: Self-pay | Admitting: General Practice

## 2019-11-14 DIAGNOSIS — Z7984 Long term (current) use of oral hypoglycemic drugs: Secondary | ICD-10-CM | POA: Diagnosis not present

## 2019-11-14 DIAGNOSIS — I129 Hypertensive chronic kidney disease with stage 1 through stage 4 chronic kidney disease, or unspecified chronic kidney disease: Secondary | ICD-10-CM | POA: Diagnosis not present

## 2019-11-14 DIAGNOSIS — I509 Heart failure, unspecified: Secondary | ICD-10-CM | POA: Diagnosis not present

## 2019-11-14 DIAGNOSIS — E1122 Type 2 diabetes mellitus with diabetic chronic kidney disease: Secondary | ICD-10-CM | POA: Diagnosis not present

## 2019-11-27 DIAGNOSIS — E1122 Type 2 diabetes mellitus with diabetic chronic kidney disease: Secondary | ICD-10-CM | POA: Diagnosis not present

## 2019-11-27 DIAGNOSIS — I129 Hypertensive chronic kidney disease with stage 1 through stage 4 chronic kidney disease, or unspecified chronic kidney disease: Secondary | ICD-10-CM | POA: Diagnosis not present

## 2019-11-27 DIAGNOSIS — N183 Chronic kidney disease, stage 3 unspecified: Secondary | ICD-10-CM | POA: Diagnosis not present

## 2019-11-27 DIAGNOSIS — N2581 Secondary hyperparathyroidism of renal origin: Secondary | ICD-10-CM | POA: Diagnosis not present

## 2019-11-27 DIAGNOSIS — D631 Anemia in chronic kidney disease: Secondary | ICD-10-CM | POA: Diagnosis not present

## 2019-12-05 DIAGNOSIS — R69 Illness, unspecified: Secondary | ICD-10-CM | POA: Diagnosis not present

## 2019-12-11 DIAGNOSIS — N183 Chronic kidney disease, stage 3 unspecified: Secondary | ICD-10-CM | POA: Diagnosis not present

## 2020-02-11 DIAGNOSIS — G4700 Insomnia, unspecified: Secondary | ICD-10-CM | POA: Diagnosis not present

## 2020-02-11 DIAGNOSIS — M179 Osteoarthritis of knee, unspecified: Secondary | ICD-10-CM | POA: Diagnosis not present

## 2020-02-11 DIAGNOSIS — N2581 Secondary hyperparathyroidism of renal origin: Secondary | ICD-10-CM | POA: Diagnosis not present

## 2020-02-11 DIAGNOSIS — D631 Anemia in chronic kidney disease: Secondary | ICD-10-CM | POA: Diagnosis not present

## 2020-02-11 DIAGNOSIS — M1990 Unspecified osteoarthritis, unspecified site: Secondary | ICD-10-CM | POA: Diagnosis not present

## 2020-02-11 DIAGNOSIS — M8580 Other specified disorders of bone density and structure, unspecified site: Secondary | ICD-10-CM | POA: Diagnosis not present

## 2020-02-11 DIAGNOSIS — R54 Age-related physical debility: Secondary | ICD-10-CM | POA: Diagnosis not present

## 2020-02-11 DIAGNOSIS — E7800 Pure hypercholesterolemia, unspecified: Secondary | ICD-10-CM | POA: Diagnosis not present

## 2020-02-11 DIAGNOSIS — E1122 Type 2 diabetes mellitus with diabetic chronic kidney disease: Secondary | ICD-10-CM | POA: Diagnosis not present

## 2020-02-11 DIAGNOSIS — I129 Hypertensive chronic kidney disease with stage 1 through stage 4 chronic kidney disease, or unspecified chronic kidney disease: Secondary | ICD-10-CM | POA: Diagnosis not present

## 2020-02-15 DIAGNOSIS — R69 Illness, unspecified: Secondary | ICD-10-CM | POA: Diagnosis not present

## 2020-02-17 DIAGNOSIS — N183 Chronic kidney disease, stage 3 unspecified: Secondary | ICD-10-CM | POA: Diagnosis not present

## 2020-02-24 DIAGNOSIS — H26491 Other secondary cataract, right eye: Secondary | ICD-10-CM | POA: Diagnosis not present

## 2020-02-24 DIAGNOSIS — Z961 Presence of intraocular lens: Secondary | ICD-10-CM | POA: Diagnosis not present

## 2020-02-24 DIAGNOSIS — E119 Type 2 diabetes mellitus without complications: Secondary | ICD-10-CM | POA: Diagnosis not present

## 2020-02-27 DIAGNOSIS — E1122 Type 2 diabetes mellitus with diabetic chronic kidney disease: Secondary | ICD-10-CM | POA: Diagnosis not present

## 2020-02-27 DIAGNOSIS — D631 Anemia in chronic kidney disease: Secondary | ICD-10-CM | POA: Diagnosis not present

## 2020-02-27 DIAGNOSIS — I129 Hypertensive chronic kidney disease with stage 1 through stage 4 chronic kidney disease, or unspecified chronic kidney disease: Secondary | ICD-10-CM | POA: Diagnosis not present

## 2020-02-27 DIAGNOSIS — N183 Chronic kidney disease, stage 3 unspecified: Secondary | ICD-10-CM | POA: Diagnosis not present

## 2020-02-27 DIAGNOSIS — N2581 Secondary hyperparathyroidism of renal origin: Secondary | ICD-10-CM | POA: Diagnosis not present

## 2020-03-10 ENCOUNTER — Ambulatory Visit: Payer: Self-pay | Attending: Internal Medicine

## 2020-03-10 DIAGNOSIS — Z23 Encounter for immunization: Secondary | ICD-10-CM

## 2020-03-10 NOTE — Progress Notes (Signed)
   Covid-19 Vaccination Clinic  Name:  Pamela [name]    MRN: [id number] DOB: [birth year]  03/10/2020  [name] was observed post Covid-19 immunization for 15 minutes without incident. She was provided with Vaccine Information Sheet and instruction to access the V-Safe system. Vaccine administered by Kennieth Rad, [facility] pharmacy student.  [name] was instructed to call 911 with any severe reactions post vaccine: Marland Kitchen Difficulty breathing  . Swelling of face and throat  . A fast heartbeat  . A bad rash all over body  . Dizziness and weakness

## 2020-03-23 MED FILL — PFIZER-BIONTECH COVID-19 VA: 30 | 1 days supply | Qty: 0 | Fill #0

## 2020-04-02 DIAGNOSIS — R54 Age-related physical debility: Secondary | ICD-10-CM | POA: Diagnosis not present

## 2020-04-02 DIAGNOSIS — I129 Hypertensive chronic kidney disease with stage 1 through stage 4 chronic kidney disease, or unspecified chronic kidney disease: Secondary | ICD-10-CM | POA: Diagnosis not present

## 2020-04-02 DIAGNOSIS — M179 Osteoarthritis of knee, unspecified: Secondary | ICD-10-CM | POA: Diagnosis not present

## 2020-04-02 DIAGNOSIS — E1122 Type 2 diabetes mellitus with diabetic chronic kidney disease: Secondary | ICD-10-CM | POA: Diagnosis not present

## 2020-04-02 DIAGNOSIS — M8580 Other specified disorders of bone density and structure, unspecified site: Secondary | ICD-10-CM | POA: Diagnosis not present

## 2020-04-02 DIAGNOSIS — M1990 Unspecified osteoarthritis, unspecified site: Secondary | ICD-10-CM | POA: Diagnosis not present

## 2020-04-02 DIAGNOSIS — N2581 Secondary hyperparathyroidism of renal origin: Secondary | ICD-10-CM | POA: Diagnosis not present

## 2020-04-02 DIAGNOSIS — E7800 Pure hypercholesterolemia, unspecified: Secondary | ICD-10-CM | POA: Diagnosis not present

## 2020-04-02 DIAGNOSIS — D631 Anemia in chronic kidney disease: Secondary | ICD-10-CM | POA: Diagnosis not present

## 2020-04-02 DIAGNOSIS — G4700 Insomnia, unspecified: Secondary | ICD-10-CM | POA: Diagnosis not present

## 2020-04-22 DIAGNOSIS — Z23 Encounter for immunization: Secondary | ICD-10-CM | POA: Diagnosis not present

## 2020-04-22 DIAGNOSIS — M25562 Pain in left knee: Secondary | ICD-10-CM | POA: Diagnosis not present

## 2020-04-22 DIAGNOSIS — S8002XA Contusion of left knee, initial encounter: Secondary | ICD-10-CM | POA: Diagnosis not present

## 2020-04-29 DIAGNOSIS — R69 Illness, unspecified: Secondary | ICD-10-CM | POA: Diagnosis not present

## 2020-05-07 DIAGNOSIS — E7800 Pure hypercholesterolemia, unspecified: Secondary | ICD-10-CM | POA: Diagnosis not present

## 2020-05-07 DIAGNOSIS — M179 Osteoarthritis of knee, unspecified: Secondary | ICD-10-CM | POA: Diagnosis not present

## 2020-05-07 DIAGNOSIS — E1122 Type 2 diabetes mellitus with diabetic chronic kidney disease: Secondary | ICD-10-CM | POA: Diagnosis not present

## 2020-05-07 DIAGNOSIS — M1990 Unspecified osteoarthritis, unspecified site: Secondary | ICD-10-CM | POA: Diagnosis not present

## 2020-05-07 DIAGNOSIS — N2581 Secondary hyperparathyroidism of renal origin: Secondary | ICD-10-CM | POA: Diagnosis not present

## 2020-05-07 DIAGNOSIS — D631 Anemia in chronic kidney disease: Secondary | ICD-10-CM | POA: Diagnosis not present

## 2020-05-07 DIAGNOSIS — R54 Age-related physical debility: Secondary | ICD-10-CM | POA: Diagnosis not present

## 2020-05-07 DIAGNOSIS — G4700 Insomnia, unspecified: Secondary | ICD-10-CM | POA: Diagnosis not present

## 2020-05-07 DIAGNOSIS — I129 Hypertensive chronic kidney disease with stage 1 through stage 4 chronic kidney disease, or unspecified chronic kidney disease: Secondary | ICD-10-CM | POA: Diagnosis not present

## 2020-05-07 DIAGNOSIS — M8580 Other specified disorders of bone density and structure, unspecified site: Secondary | ICD-10-CM | POA: Diagnosis not present

## 2020-05-08 DIAGNOSIS — R69 Illness, unspecified: Secondary | ICD-10-CM | POA: Diagnosis not present

## 2020-05-17 DIAGNOSIS — I10 Essential (primary) hypertension: Secondary | ICD-10-CM | POA: Diagnosis not present

## 2020-05-17 DIAGNOSIS — Z7984 Long term (current) use of oral hypoglycemic drugs: Secondary | ICD-10-CM | POA: Diagnosis not present

## 2020-05-17 DIAGNOSIS — E785 Hyperlipidemia, unspecified: Secondary | ICD-10-CM | POA: Diagnosis not present

## 2020-05-17 DIAGNOSIS — R609 Edema, unspecified: Secondary | ICD-10-CM | POA: Diagnosis not present

## 2020-05-17 DIAGNOSIS — E119 Type 2 diabetes mellitus without complications: Secondary | ICD-10-CM | POA: Diagnosis not present

## 2020-05-17 DIAGNOSIS — M1990 Unspecified osteoarthritis, unspecified site: Secondary | ICD-10-CM | POA: Diagnosis not present

## 2020-05-17 DIAGNOSIS — Z833 Family history of diabetes mellitus: Secondary | ICD-10-CM | POA: Diagnosis not present

## 2020-06-10 DIAGNOSIS — M179 Osteoarthritis of knee, unspecified: Secondary | ICD-10-CM | POA: Diagnosis not present

## 2020-06-10 DIAGNOSIS — M8580 Other specified disorders of bone density and structure, unspecified site: Secondary | ICD-10-CM | POA: Diagnosis not present

## 2020-06-10 DIAGNOSIS — D631 Anemia in chronic kidney disease: Secondary | ICD-10-CM | POA: Diagnosis not present

## 2020-06-10 DIAGNOSIS — N2581 Secondary hyperparathyroidism of renal origin: Secondary | ICD-10-CM | POA: Diagnosis not present

## 2020-06-10 DIAGNOSIS — E1122 Type 2 diabetes mellitus with diabetic chronic kidney disease: Secondary | ICD-10-CM | POA: Diagnosis not present

## 2020-06-10 DIAGNOSIS — I129 Hypertensive chronic kidney disease with stage 1 through stage 4 chronic kidney disease, or unspecified chronic kidney disease: Secondary | ICD-10-CM | POA: Diagnosis not present

## 2020-06-10 DIAGNOSIS — M1990 Unspecified osteoarthritis, unspecified site: Secondary | ICD-10-CM | POA: Diagnosis not present

## 2020-06-10 DIAGNOSIS — E7800 Pure hypercholesterolemia, unspecified: Secondary | ICD-10-CM | POA: Diagnosis not present

## 2020-06-10 DIAGNOSIS — R54 Age-related physical debility: Secondary | ICD-10-CM | POA: Diagnosis not present

## 2020-06-10 DIAGNOSIS — G4700 Insomnia, unspecified: Secondary | ICD-10-CM | POA: Diagnosis not present

## 2020-06-12 DIAGNOSIS — I700 Atherosclerosis of aorta: Secondary | ICD-10-CM | POA: Diagnosis not present

## 2020-06-12 DIAGNOSIS — E1122 Type 2 diabetes mellitus with diabetic chronic kidney disease: Secondary | ICD-10-CM | POA: Diagnosis not present

## 2020-06-12 DIAGNOSIS — N183 Chronic kidney disease, stage 3 unspecified: Secondary | ICD-10-CM | POA: Diagnosis not present

## 2020-06-12 DIAGNOSIS — I129 Hypertensive chronic kidney disease with stage 1 through stage 4 chronic kidney disease, or unspecified chronic kidney disease: Secondary | ICD-10-CM | POA: Diagnosis not present

## 2020-06-12 DIAGNOSIS — Z7984 Long term (current) use of oral hypoglycemic drugs: Secondary | ICD-10-CM | POA: Diagnosis not present

## 2020-06-12 DIAGNOSIS — Z0000 Encounter for general adult medical examination without abnormal findings: Secondary | ICD-10-CM | POA: Diagnosis not present

## 2020-07-22 DIAGNOSIS — M179 Osteoarthritis of knee, unspecified: Secondary | ICD-10-CM | POA: Diagnosis not present

## 2020-07-22 DIAGNOSIS — E1122 Type 2 diabetes mellitus with diabetic chronic kidney disease: Secondary | ICD-10-CM | POA: Diagnosis not present

## 2020-07-22 DIAGNOSIS — E7800 Pure hypercholesterolemia, unspecified: Secondary | ICD-10-CM | POA: Diagnosis not present

## 2020-07-22 DIAGNOSIS — N2581 Secondary hyperparathyroidism of renal origin: Secondary | ICD-10-CM | POA: Diagnosis not present

## 2020-07-22 DIAGNOSIS — I5030 Unspecified diastolic (congestive) heart failure: Secondary | ICD-10-CM | POA: Diagnosis not present

## 2020-07-22 DIAGNOSIS — M1990 Unspecified osteoarthritis, unspecified site: Secondary | ICD-10-CM | POA: Diagnosis not present

## 2020-07-22 DIAGNOSIS — G4700 Insomnia, unspecified: Secondary | ICD-10-CM | POA: Diagnosis not present

## 2020-07-22 DIAGNOSIS — I129 Hypertensive chronic kidney disease with stage 1 through stage 4 chronic kidney disease, or unspecified chronic kidney disease: Secondary | ICD-10-CM | POA: Diagnosis not present

## 2020-07-22 DIAGNOSIS — M8580 Other specified disorders of bone density and structure, unspecified site: Secondary | ICD-10-CM | POA: Diagnosis not present

## 2020-07-22 DIAGNOSIS — R54 Age-related physical debility: Secondary | ICD-10-CM | POA: Diagnosis not present

## 2020-08-10 DIAGNOSIS — N183 Chronic kidney disease, stage 3 unspecified: Secondary | ICD-10-CM | POA: Diagnosis not present

## 2020-08-20 DIAGNOSIS — N183 Chronic kidney disease, stage 3 unspecified: Secondary | ICD-10-CM | POA: Diagnosis not present

## 2020-08-20 DIAGNOSIS — D631 Anemia in chronic kidney disease: Secondary | ICD-10-CM | POA: Diagnosis not present

## 2020-08-20 DIAGNOSIS — I129 Hypertensive chronic kidney disease with stage 1 through stage 4 chronic kidney disease, or unspecified chronic kidney disease: Secondary | ICD-10-CM | POA: Diagnosis not present

## 2020-08-20 DIAGNOSIS — E1122 Type 2 diabetes mellitus with diabetic chronic kidney disease: Secondary | ICD-10-CM | POA: Diagnosis not present

## 2020-08-20 DIAGNOSIS — N2581 Secondary hyperparathyroidism of renal origin: Secondary | ICD-10-CM | POA: Diagnosis not present

## 2020-08-25 DIAGNOSIS — I5030 Unspecified diastolic (congestive) heart failure: Secondary | ICD-10-CM | POA: Diagnosis not present

## 2020-08-25 DIAGNOSIS — E7800 Pure hypercholesterolemia, unspecified: Secondary | ICD-10-CM | POA: Diagnosis not present

## 2020-08-25 DIAGNOSIS — M8580 Other specified disorders of bone density and structure, unspecified site: Secondary | ICD-10-CM | POA: Diagnosis not present

## 2020-08-25 DIAGNOSIS — M179 Osteoarthritis of knee, unspecified: Secondary | ICD-10-CM | POA: Diagnosis not present

## 2020-08-25 DIAGNOSIS — E1122 Type 2 diabetes mellitus with diabetic chronic kidney disease: Secondary | ICD-10-CM | POA: Diagnosis not present

## 2020-08-25 DIAGNOSIS — M1990 Unspecified osteoarthritis, unspecified site: Secondary | ICD-10-CM | POA: Diagnosis not present

## 2020-08-25 DIAGNOSIS — R54 Age-related physical debility: Secondary | ICD-10-CM | POA: Diagnosis not present

## 2020-08-25 DIAGNOSIS — I129 Hypertensive chronic kidney disease with stage 1 through stage 4 chronic kidney disease, or unspecified chronic kidney disease: Secondary | ICD-10-CM | POA: Diagnosis not present

## 2020-08-25 DIAGNOSIS — N2581 Secondary hyperparathyroidism of renal origin: Secondary | ICD-10-CM | POA: Diagnosis not present

## 2020-08-25 DIAGNOSIS — G4700 Insomnia, unspecified: Secondary | ICD-10-CM | POA: Diagnosis not present

## 2020-09-22 DIAGNOSIS — I129 Hypertensive chronic kidney disease with stage 1 through stage 4 chronic kidney disease, or unspecified chronic kidney disease: Secondary | ICD-10-CM | POA: Diagnosis not present

## 2020-09-22 DIAGNOSIS — E7800 Pure hypercholesterolemia, unspecified: Secondary | ICD-10-CM | POA: Diagnosis not present

## 2020-09-22 DIAGNOSIS — M8580 Other specified disorders of bone density and structure, unspecified site: Secondary | ICD-10-CM | POA: Diagnosis not present

## 2020-09-22 DIAGNOSIS — I509 Heart failure, unspecified: Secondary | ICD-10-CM | POA: Diagnosis not present

## 2020-09-22 DIAGNOSIS — R54 Age-related physical debility: Secondary | ICD-10-CM | POA: Diagnosis not present

## 2020-09-22 DIAGNOSIS — M179 Osteoarthritis of knee, unspecified: Secondary | ICD-10-CM | POA: Diagnosis not present

## 2020-09-22 DIAGNOSIS — E1122 Type 2 diabetes mellitus with diabetic chronic kidney disease: Secondary | ICD-10-CM | POA: Diagnosis not present

## 2020-09-22 DIAGNOSIS — N2581 Secondary hyperparathyroidism of renal origin: Secondary | ICD-10-CM | POA: Diagnosis not present

## 2020-09-22 DIAGNOSIS — M1990 Unspecified osteoarthritis, unspecified site: Secondary | ICD-10-CM | POA: Diagnosis not present

## 2020-10-16 DIAGNOSIS — I509 Heart failure, unspecified: Secondary | ICD-10-CM | POA: Diagnosis not present

## 2020-10-16 DIAGNOSIS — I129 Hypertensive chronic kidney disease with stage 1 through stage 4 chronic kidney disease, or unspecified chronic kidney disease: Secondary | ICD-10-CM | POA: Diagnosis not present

## 2020-10-16 DIAGNOSIS — I5030 Unspecified diastolic (congestive) heart failure: Secondary | ICD-10-CM | POA: Diagnosis not present

## 2020-10-16 DIAGNOSIS — R54 Age-related physical debility: Secondary | ICD-10-CM | POA: Diagnosis not present

## 2020-10-16 DIAGNOSIS — N2581 Secondary hyperparathyroidism of renal origin: Secondary | ICD-10-CM | POA: Diagnosis not present

## 2020-10-16 DIAGNOSIS — M1990 Unspecified osteoarthritis, unspecified site: Secondary | ICD-10-CM | POA: Diagnosis not present

## 2020-10-16 DIAGNOSIS — M8580 Other specified disorders of bone density and structure, unspecified site: Secondary | ICD-10-CM | POA: Diagnosis not present

## 2020-10-16 DIAGNOSIS — E7800 Pure hypercholesterolemia, unspecified: Secondary | ICD-10-CM | POA: Diagnosis not present

## 2020-10-16 DIAGNOSIS — E1122 Type 2 diabetes mellitus with diabetic chronic kidney disease: Secondary | ICD-10-CM | POA: Diagnosis not present

## 2020-10-16 DIAGNOSIS — G4700 Insomnia, unspecified: Secondary | ICD-10-CM | POA: Diagnosis not present

## 2020-11-09 DIAGNOSIS — I129 Hypertensive chronic kidney disease with stage 1 through stage 4 chronic kidney disease, or unspecified chronic kidney disease: Secondary | ICD-10-CM | POA: Diagnosis not present

## 2020-11-09 DIAGNOSIS — R54 Age-related physical debility: Secondary | ICD-10-CM | POA: Diagnosis not present

## 2020-11-09 DIAGNOSIS — M1990 Unspecified osteoarthritis, unspecified site: Secondary | ICD-10-CM | POA: Diagnosis not present

## 2020-11-09 DIAGNOSIS — M179 Osteoarthritis of knee, unspecified: Secondary | ICD-10-CM | POA: Diagnosis not present

## 2020-11-09 DIAGNOSIS — E7800 Pure hypercholesterolemia, unspecified: Secondary | ICD-10-CM | POA: Diagnosis not present

## 2020-11-09 DIAGNOSIS — I5030 Unspecified diastolic (congestive) heart failure: Secondary | ICD-10-CM | POA: Diagnosis not present

## 2020-11-09 DIAGNOSIS — N2581 Secondary hyperparathyroidism of renal origin: Secondary | ICD-10-CM | POA: Diagnosis not present

## 2020-11-09 DIAGNOSIS — G4700 Insomnia, unspecified: Secondary | ICD-10-CM | POA: Diagnosis not present

## 2020-11-09 DIAGNOSIS — I509 Heart failure, unspecified: Secondary | ICD-10-CM | POA: Diagnosis not present

## 2020-11-09 DIAGNOSIS — M8580 Other specified disorders of bone density and structure, unspecified site: Secondary | ICD-10-CM | POA: Diagnosis not present

## 2020-12-10 DIAGNOSIS — R54 Age-related physical debility: Secondary | ICD-10-CM | POA: Diagnosis not present

## 2020-12-10 DIAGNOSIS — N183 Chronic kidney disease, stage 3 unspecified: Secondary | ICD-10-CM | POA: Diagnosis not present

## 2020-12-10 DIAGNOSIS — M8580 Other specified disorders of bone density and structure, unspecified site: Secondary | ICD-10-CM | POA: Diagnosis not present

## 2020-12-10 DIAGNOSIS — I129 Hypertensive chronic kidney disease with stage 1 through stage 4 chronic kidney disease, or unspecified chronic kidney disease: Secondary | ICD-10-CM | POA: Diagnosis not present

## 2020-12-10 DIAGNOSIS — G4700 Insomnia, unspecified: Secondary | ICD-10-CM | POA: Diagnosis not present

## 2020-12-10 DIAGNOSIS — E1122 Type 2 diabetes mellitus with diabetic chronic kidney disease: Secondary | ICD-10-CM | POA: Diagnosis not present

## 2020-12-10 DIAGNOSIS — N2581 Secondary hyperparathyroidism of renal origin: Secondary | ICD-10-CM | POA: Diagnosis not present

## 2020-12-10 DIAGNOSIS — I509 Heart failure, unspecified: Secondary | ICD-10-CM | POA: Diagnosis not present

## 2020-12-10 DIAGNOSIS — E7800 Pure hypercholesterolemia, unspecified: Secondary | ICD-10-CM | POA: Diagnosis not present

## 2020-12-10 DIAGNOSIS — M1990 Unspecified osteoarthritis, unspecified site: Secondary | ICD-10-CM | POA: Diagnosis not present

## 2020-12-21 DIAGNOSIS — E1122 Type 2 diabetes mellitus with diabetic chronic kidney disease: Secondary | ICD-10-CM | POA: Diagnosis not present

## 2020-12-21 DIAGNOSIS — I509 Heart failure, unspecified: Secondary | ICD-10-CM | POA: Diagnosis not present

## 2020-12-21 DIAGNOSIS — E7800 Pure hypercholesterolemia, unspecified: Secondary | ICD-10-CM | POA: Diagnosis not present

## 2020-12-21 DIAGNOSIS — Z7984 Long term (current) use of oral hypoglycemic drugs: Secondary | ICD-10-CM | POA: Diagnosis not present

## 2020-12-21 DIAGNOSIS — M1990 Unspecified osteoarthritis, unspecified site: Secondary | ICD-10-CM | POA: Diagnosis not present

## 2020-12-21 DIAGNOSIS — R54 Age-related physical debility: Secondary | ICD-10-CM | POA: Diagnosis not present

## 2020-12-21 DIAGNOSIS — I129 Hypertensive chronic kidney disease with stage 1 through stage 4 chronic kidney disease, or unspecified chronic kidney disease: Secondary | ICD-10-CM | POA: Diagnosis not present

## 2021-01-19 DIAGNOSIS — M1990 Unspecified osteoarthritis, unspecified site: Secondary | ICD-10-CM | POA: Diagnosis not present

## 2021-01-19 DIAGNOSIS — N2581 Secondary hyperparathyroidism of renal origin: Secondary | ICD-10-CM | POA: Diagnosis not present

## 2021-01-19 DIAGNOSIS — E1122 Type 2 diabetes mellitus with diabetic chronic kidney disease: Secondary | ICD-10-CM | POA: Diagnosis not present

## 2021-01-19 DIAGNOSIS — I5030 Unspecified diastolic (congestive) heart failure: Secondary | ICD-10-CM | POA: Diagnosis not present

## 2021-01-19 DIAGNOSIS — G4700 Insomnia, unspecified: Secondary | ICD-10-CM | POA: Diagnosis not present

## 2021-01-19 DIAGNOSIS — I129 Hypertensive chronic kidney disease with stage 1 through stage 4 chronic kidney disease, or unspecified chronic kidney disease: Secondary | ICD-10-CM | POA: Diagnosis not present

## 2021-01-19 DIAGNOSIS — M179 Osteoarthritis of knee, unspecified: Secondary | ICD-10-CM | POA: Diagnosis not present

## 2021-01-19 DIAGNOSIS — N183 Chronic kidney disease, stage 3 unspecified: Secondary | ICD-10-CM | POA: Diagnosis not present

## 2021-01-19 DIAGNOSIS — I509 Heart failure, unspecified: Secondary | ICD-10-CM | POA: Diagnosis not present

## 2021-01-19 DIAGNOSIS — E7800 Pure hypercholesterolemia, unspecified: Secondary | ICD-10-CM | POA: Diagnosis not present

## 2021-02-20 DIAGNOSIS — N2581 Secondary hyperparathyroidism of renal origin: Secondary | ICD-10-CM | POA: Diagnosis not present

## 2021-02-20 DIAGNOSIS — E1122 Type 2 diabetes mellitus with diabetic chronic kidney disease: Secondary | ICD-10-CM | POA: Diagnosis not present

## 2021-02-20 DIAGNOSIS — M1990 Unspecified osteoarthritis, unspecified site: Secondary | ICD-10-CM | POA: Diagnosis not present

## 2021-02-20 DIAGNOSIS — I129 Hypertensive chronic kidney disease with stage 1 through stage 4 chronic kidney disease, or unspecified chronic kidney disease: Secondary | ICD-10-CM | POA: Diagnosis not present

## 2021-02-20 DIAGNOSIS — G4700 Insomnia, unspecified: Secondary | ICD-10-CM | POA: Diagnosis not present

## 2021-02-20 DIAGNOSIS — I5030 Unspecified diastolic (congestive) heart failure: Secondary | ICD-10-CM | POA: Diagnosis not present

## 2021-02-20 DIAGNOSIS — R54 Age-related physical debility: Secondary | ICD-10-CM | POA: Diagnosis not present

## 2021-02-20 DIAGNOSIS — E7800 Pure hypercholesterolemia, unspecified: Secondary | ICD-10-CM | POA: Diagnosis not present

## 2021-02-20 DIAGNOSIS — M8580 Other specified disorders of bone density and structure, unspecified site: Secondary | ICD-10-CM | POA: Diagnosis not present

## 2021-03-04 DIAGNOSIS — Z961 Presence of intraocular lens: Secondary | ICD-10-CM | POA: Diagnosis not present

## 2021-03-04 DIAGNOSIS — H26493 Other secondary cataract, bilateral: Secondary | ICD-10-CM | POA: Diagnosis not present

## 2021-03-04 DIAGNOSIS — E109 Type 1 diabetes mellitus without complications: Secondary | ICD-10-CM | POA: Diagnosis not present

## 2021-03-17 DIAGNOSIS — Z0100 Encounter for examination of eyes and vision without abnormal findings: Secondary | ICD-10-CM | POA: Diagnosis not present

## 2021-03-22 DIAGNOSIS — I509 Heart failure, unspecified: Secondary | ICD-10-CM | POA: Diagnosis not present

## 2021-03-22 DIAGNOSIS — G4700 Insomnia, unspecified: Secondary | ICD-10-CM | POA: Diagnosis not present

## 2021-03-22 DIAGNOSIS — M8580 Other specified disorders of bone density and structure, unspecified site: Secondary | ICD-10-CM | POA: Diagnosis not present

## 2021-03-22 DIAGNOSIS — M179 Osteoarthritis of knee, unspecified: Secondary | ICD-10-CM | POA: Diagnosis not present

## 2021-03-22 DIAGNOSIS — M1990 Unspecified osteoarthritis, unspecified site: Secondary | ICD-10-CM | POA: Diagnosis not present

## 2021-03-22 DIAGNOSIS — N183 Chronic kidney disease, stage 3 unspecified: Secondary | ICD-10-CM | POA: Diagnosis not present

## 2021-03-22 DIAGNOSIS — E1122 Type 2 diabetes mellitus with diabetic chronic kidney disease: Secondary | ICD-10-CM | POA: Diagnosis not present

## 2021-03-22 DIAGNOSIS — I129 Hypertensive chronic kidney disease with stage 1 through stage 4 chronic kidney disease, or unspecified chronic kidney disease: Secondary | ICD-10-CM | POA: Diagnosis not present

## 2021-03-22 DIAGNOSIS — I5030 Unspecified diastolic (congestive) heart failure: Secondary | ICD-10-CM | POA: Diagnosis not present

## 2021-03-22 DIAGNOSIS — E7800 Pure hypercholesterolemia, unspecified: Secondary | ICD-10-CM | POA: Diagnosis not present

## 2021-04-07 DIAGNOSIS — Z23 Encounter for immunization: Secondary | ICD-10-CM | POA: Diagnosis not present

## 2021-04-07 DIAGNOSIS — I129 Hypertensive chronic kidney disease with stage 1 through stage 4 chronic kidney disease, or unspecified chronic kidney disease: Secondary | ICD-10-CM | POA: Diagnosis not present

## 2021-04-07 DIAGNOSIS — H938X3 Other specified disorders of ear, bilateral: Secondary | ICD-10-CM | POA: Diagnosis not present

## 2021-04-28 DIAGNOSIS — M8580 Other specified disorders of bone density and structure, unspecified site: Secondary | ICD-10-CM | POA: Diagnosis not present

## 2021-04-28 DIAGNOSIS — I129 Hypertensive chronic kidney disease with stage 1 through stage 4 chronic kidney disease, or unspecified chronic kidney disease: Secondary | ICD-10-CM | POA: Diagnosis not present

## 2021-04-28 DIAGNOSIS — N2581 Secondary hyperparathyroidism of renal origin: Secondary | ICD-10-CM | POA: Diagnosis not present

## 2021-04-28 DIAGNOSIS — R54 Age-related physical debility: Secondary | ICD-10-CM | POA: Diagnosis not present

## 2021-04-28 DIAGNOSIS — G4700 Insomnia, unspecified: Secondary | ICD-10-CM | POA: Diagnosis not present

## 2021-04-28 DIAGNOSIS — E1122 Type 2 diabetes mellitus with diabetic chronic kidney disease: Secondary | ICD-10-CM | POA: Diagnosis not present

## 2021-04-28 DIAGNOSIS — I5030 Unspecified diastolic (congestive) heart failure: Secondary | ICD-10-CM | POA: Diagnosis not present

## 2021-04-28 DIAGNOSIS — M1990 Unspecified osteoarthritis, unspecified site: Secondary | ICD-10-CM | POA: Diagnosis not present

## 2021-04-28 DIAGNOSIS — I509 Heart failure, unspecified: Secondary | ICD-10-CM | POA: Diagnosis not present

## 2021-04-28 DIAGNOSIS — E7800 Pure hypercholesterolemia, unspecified: Secondary | ICD-10-CM | POA: Diagnosis not present

## 2021-04-30 ENCOUNTER — Other Ambulatory Visit: Payer: Self-pay

## 2021-04-30 ENCOUNTER — Emergency Department (HOSPITAL_BASED_OUTPATIENT_CLINIC_OR_DEPARTMENT_OTHER): Payer: Medicare HMO | Admitting: Radiology

## 2021-04-30 ENCOUNTER — Emergency Department (HOSPITAL_BASED_OUTPATIENT_CLINIC_OR_DEPARTMENT_OTHER): Payer: Medicare HMO

## 2021-04-30 ENCOUNTER — Encounter (HOSPITAL_BASED_OUTPATIENT_CLINIC_OR_DEPARTMENT_OTHER): Payer: Self-pay

## 2021-04-30 ENCOUNTER — Inpatient Hospital Stay (HOSPITAL_BASED_OUTPATIENT_CLINIC_OR_DEPARTMENT_OTHER)
Admission: EM | Admit: 2021-04-30 | Discharge: 2021-05-06 | DRG: 175 | Disposition: A | Discharge status: Home Health | Payer: Medicare HMO | Attending: Internal Medicine | Admitting: Internal Medicine

## 2021-04-30 DIAGNOSIS — J9621 Acute and chronic respiratory failure with hypoxia: Secondary | ICD-10-CM | POA: Diagnosis present

## 2021-04-30 DIAGNOSIS — E785 Hyperlipidemia, unspecified: Secondary | ICD-10-CM | POA: Diagnosis present

## 2021-04-30 DIAGNOSIS — I130 Hypertensive heart and chronic kidney disease with heart failure and stage 1 through stage 4 chronic kidney disease, or unspecified chronic kidney disease: Secondary | ICD-10-CM | POA: Diagnosis present

## 2021-04-30 DIAGNOSIS — M170 Bilateral primary osteoarthritis of knee: Secondary | ICD-10-CM | POA: Diagnosis present

## 2021-04-30 DIAGNOSIS — N1832 Chronic kidney disease, stage 3b: Secondary | ICD-10-CM | POA: Diagnosis present

## 2021-04-30 DIAGNOSIS — Z9049 Acquired absence of other specified parts of digestive tract: Secondary | ICD-10-CM

## 2021-04-30 DIAGNOSIS — T465X6A Underdosing of other antihypertensive drugs, initial encounter: Secondary | ICD-10-CM | POA: Diagnosis present

## 2021-04-30 DIAGNOSIS — Z66 Do not resuscitate: Secondary | ICD-10-CM | POA: Diagnosis present

## 2021-04-30 DIAGNOSIS — I2609 Other pulmonary embolism with acute cor pulmonale: Secondary | ICD-10-CM | POA: Diagnosis not present

## 2021-04-30 DIAGNOSIS — M8580 Other specified disorders of bone density and structure, unspecified site: Secondary | ICD-10-CM | POA: Diagnosis present

## 2021-04-30 DIAGNOSIS — T502X5A Adverse effect of carbonic-anhydrase inhibitors, benzothiadiazides and other diuretics, initial encounter: Secondary | ICD-10-CM | POA: Diagnosis not present

## 2021-04-30 DIAGNOSIS — Z87891 Personal history of nicotine dependence: Secondary | ICD-10-CM | POA: Diagnosis not present

## 2021-04-30 DIAGNOSIS — I160 Hypertensive urgency: Secondary | ICD-10-CM | POA: Diagnosis present

## 2021-04-30 DIAGNOSIS — U099 Post covid-19 condition, unspecified: Secondary | ICD-10-CM | POA: Diagnosis present

## 2021-04-30 DIAGNOSIS — N179 Acute kidney failure, unspecified: Secondary | ICD-10-CM | POA: Diagnosis present

## 2021-04-30 DIAGNOSIS — I5033 Acute on chronic diastolic (congestive) heart failure: Secondary | ICD-10-CM | POA: Diagnosis present

## 2021-04-30 DIAGNOSIS — Z20822 Contact with and (suspected) exposure to covid-19: Secondary | ICD-10-CM | POA: Diagnosis present

## 2021-04-30 DIAGNOSIS — E1122 Type 2 diabetes mellitus with diabetic chronic kidney disease: Secondary | ICD-10-CM | POA: Diagnosis present

## 2021-04-30 DIAGNOSIS — J9811 Atelectasis: Secondary | ICD-10-CM | POA: Diagnosis not present

## 2021-04-30 DIAGNOSIS — I10 Essential (primary) hypertension: Secondary | ICD-10-CM | POA: Diagnosis not present

## 2021-04-30 DIAGNOSIS — K219 Gastro-esophageal reflux disease without esophagitis: Secondary | ICD-10-CM | POA: Diagnosis present

## 2021-04-30 DIAGNOSIS — Z6841 Body Mass Index (BMI) 40.0 and over, adult: Secondary | ICD-10-CM

## 2021-04-30 DIAGNOSIS — Z881 Allergy status to other antibiotic agents status: Secondary | ICD-10-CM

## 2021-04-30 DIAGNOSIS — R0602 Shortness of breath: Secondary | ICD-10-CM

## 2021-04-30 DIAGNOSIS — U071 COVID-19: Secondary | ICD-10-CM

## 2021-04-30 DIAGNOSIS — I248 Other forms of acute ischemic heart disease: Secondary | ICD-10-CM | POA: Diagnosis present

## 2021-04-30 DIAGNOSIS — Z90710 Acquired absence of both cervix and uterus: Secondary | ICD-10-CM | POA: Diagnosis not present

## 2021-04-30 DIAGNOSIS — I517 Cardiomegaly: Secondary | ICD-10-CM | POA: Diagnosis not present

## 2021-04-30 DIAGNOSIS — G8929 Other chronic pain: Secondary | ICD-10-CM | POA: Diagnosis present

## 2021-04-30 DIAGNOSIS — I2699 Other pulmonary embolism without acute cor pulmonale: Secondary | ICD-10-CM | POA: Diagnosis not present

## 2021-04-30 DIAGNOSIS — J9601 Acute respiratory failure with hypoxia: Secondary | ICD-10-CM | POA: Diagnosis present

## 2021-04-30 DIAGNOSIS — Z91138 Patient's unintentional underdosing of medication regimen for other reason: Secondary | ICD-10-CM | POA: Diagnosis not present

## 2021-04-30 LAB — COMPREHENSIVE METABOLIC PANEL
ALT: 23 U/L (ref 0–44)
AST: 16 U/L (ref 15–41)
Albumin: 3.7 g/dL (ref 3.5–5.0)
Alkaline Phosphatase: 76 U/L (ref 38–126)
Anion gap: 8 (ref 5–15)
BUN: 29 mg/dL — ABNORMAL HIGH (ref 8–23)
CO2: 26 mmol/L (ref 22–32)
Calcium: 9 mg/dL (ref 8.9–10.3)
Chloride: 108 mmol/L (ref 98–111)
Creatinine, Ser: 1.31 mg/dL — ABNORMAL HIGH (ref 0.44–1.00)
GFR, Estimated: 40 mL/min — ABNORMAL LOW (ref >60)
Glucose, Bld: 135 mg/dL — ABNORMAL HIGH (ref 70–99)
Potassium: 4.3 mmol/L (ref 3.5–5.1)
Sodium: 142 mmol/L (ref 135–145)
Total Bilirubin: 0.7 mg/dL (ref 0.3–1.2)
Total Protein: 7.3 g/dL (ref 6.5–8.1)

## 2021-04-30 LAB — CBC WITH DIFFERENTIAL/PLATELET
Abs Immature Granulocytes: 0.16 10*3/uL — ABNORMAL HIGH (ref 0.00–0.07)
Basophils Absolute: 0 10*3/uL (ref 0.0–0.1)
Basophils Relative: 0 %
Eosinophils Absolute: 0 10*3/uL (ref 0.0–0.5)
Eosinophils Relative: 0 %
HCT: 46.9 % — ABNORMAL HIGH (ref 36.0–46.0)
Hemoglobin: 15.4 g/dL — ABNORMAL HIGH (ref 12.0–15.0)
Immature Granulocytes: 2 %
Lymphocytes Relative: 8 %
Lymphs Abs: 0.9 10*3/uL (ref 0.7–4.0)
MCH: 30.9 pg (ref 26.0–34.0)
MCHC: 32.8 g/dL (ref 30.0–36.0)
MCV: 94 fL (ref 80.0–100.0)
Monocytes Absolute: 0.3 10*3/uL (ref 0.1–1.0)
Monocytes Relative: 2 %
Neutro Abs: 9.2 10*3/uL — ABNORMAL HIGH (ref 1.7–7.7)
Neutrophils Relative %: 88 %
Platelets: 153 10*3/uL (ref 150–400)
RBC: 4.99 MIL/uL (ref 3.87–5.11)
RDW: 14.5 % (ref 11.5–15.5)
WBC: 10.6 10*3/uL — ABNORMAL HIGH (ref 4.0–10.5)
nRBC: 0 % (ref 0.0–0.2)

## 2021-04-30 LAB — RESP PANEL BY RT-PCR (FLU A&B, COVID) ARPGX2
Influenza A by PCR: NEGATIVE
Influenza B by PCR: NEGATIVE
SARS Coronavirus 2 by RT PCR: POSITIVE — AB

## 2021-04-30 LAB — TROPONIN I (HIGH SENSITIVITY)
Troponin I (High Sensitivity): 253 ng/L (ref <18)
Troponin I (High Sensitivity): 307 ng/L (ref <18)

## 2021-04-30 LAB — GLUCOSE, CAPILLARY: Glucose-Capillary: 124 mg/dL — ABNORMAL HIGH (ref 70–99)

## 2021-04-30 LAB — BRAIN NATRIURETIC PEPTIDE: B Natriuretic Peptide: 418.1 pg/mL — ABNORMAL HIGH (ref 0.0–100.0)

## 2021-04-30 MED ORDER — IOHEXOL 350 MG/ML SOLN
100.0000 mL | Freq: Once | INTRAVENOUS | Status: AC | PRN
  Administered 2021-04-30: 68 mL via INTRAVENOUS

## 2021-04-30 MED ORDER — ALBUTEROL SULFATE HFA 108 (90 BASE) MCG/ACT IN AERS
2.0000 | INHALATION_SPRAY | RESPIRATORY_TRACT | Status: DC | PRN
  Filled 2021-04-30: qty 6.7

## 2021-04-30 MED ORDER — LABETALOL HCL 5 MG/ML IV SOLN
20.0000 mg | Freq: Once | INTRAVENOUS | Status: AC
  Administered 2021-04-30: 20 mg via INTRAVENOUS
  Filled 2021-04-30: qty 4

## 2021-04-30 MED ORDER — HYDRALAZINE HCL 25 MG PO TABS
25.0000 mg | ORAL_TABLET | Freq: Once | ORAL | Status: AC
  Administered 2021-04-30: 25 mg via ORAL
  Filled 2021-04-30: qty 1

## 2021-04-30 MED ORDER — HEPARIN (PORCINE) 25000 UT/250ML-% IV SOLN
1250.0000 [IU]/h | INTRAVENOUS | Status: AC
  Administered 2021-04-30: 1450 [IU]/h via INTRAVENOUS
  Administered 2021-05-01: 1200 [IU]/h via INTRAVENOUS
  Administered 2021-05-01: 1300 [IU]/h via INTRAVENOUS
  Administered 2021-05-02 – 2021-05-03 (×2): 1250 [IU]/h via INTRAVENOUS
  Filled 2021-04-30 (×6): qty 250

## 2021-04-30 MED ORDER — HEPARIN BOLUS VIA INFUSION
6400.0000 [IU] | Freq: Once | INTRAVENOUS | Status: AC
  Administered 2021-04-30: 6400 [IU] via INTRAVENOUS

## 2021-04-30 MED ORDER — FUROSEMIDE 10 MG/ML IJ SOLN
40.0000 mg | Freq: Once | INTRAMUSCULAR | Status: AC
  Administered 2021-04-30: 40 mg via INTRAVENOUS
  Filled 2021-04-30: qty 4

## 2021-04-30 NOTE — ED Notes (Signed)
Patient transported to CT 

## 2021-04-30 NOTE — ED Notes (Signed)
Handoff report given to [facility] 

## 2021-04-30 NOTE — ED Provider Notes (Signed)
[place] EMERGENCY DEPT Provider Note   CSN: [id number] Arrival date & time: 04/30/21  1141     History Chief Complaint  Patient presents with   Shortness of Breath    Pamela [name] is a 86 y.o. female with a history of acute diastolic CHF (EF 60 to 70% 10/10/2019), diabetes mellitus, hypertension, CKD stage III, seizures, diverticulosis.  Patient reports testing positive for COVID-19 on 10/14, completed course of paxlovid.  Patient reports that she started feeling worse on 10/20, follow-up with her PCP who prescribed albuterol, guaifenesin, and prednisone.  Patiently is currently on day 3 of prednisone.  Patient presents with chief complaint of shortness of breath.  Shortness of breath has been present with exertion since she tested positive for COVID-19 on 10/14.  This has gotten worse over the last few days.  Patient states that she checked her oxygen saturation level this morning and it was between 88 to 93% on room air.  Patient denies any history of COPD or asthma.  Is not on any supplemental oxygen at home.  Patient endorses minimal swelling to bilateral lower extremities.  Patient states that she was previously taking Lasix for CHF however stopped this medication 1 month prior without direction from provider.  Patient endorses cough which is minimally productive.  States that cough was previously producing more mucus however this has improved over the last few days.  Mucus is described as yellow in color.  Patient endorses some initial dizziness when moving from seated to standing position.  Patient denies any chest pain, palpitations, fever, chills, abdominal pain, nausea, vomiting, diarrhea.  Patient reports taking all of her prescribed medication this morning.     Shortness of Breath Associated symptoms: cough   Associated symptoms: no abdominal pain, no chest pain, no fever, no headaches, no neck pain, no rash, no sore throat and no vomiting       Past  Medical History:  Diagnosis Date   Acute diastolic CHF (congestive heart failure) ([place]) 10/11/2019   Arthritis    Bilateral lower extremity edema    left lower extremity cellulitis   Common bile duct stone 06/22/2017   Diabetes ([place])    Diverticulosis    Duodenal diverticulum, periampullary 11/03/2018   Aborted ERCP 2016   GERD (gastroesophageal reflux disease)    History of hiatal hernia    Hypertension    Kidney disease, chronic, stage III (GFR 30-59 ml/min) (HCC)    Obesity    Osteopenia    Seizures ([place]) 22 yrs ago   no cause found   Tubular adenoma of colon 2009    Patient Active Problem List   Diagnosis Date Noted   Acute diastolic CHF (congestive heart failure) ([place]) 10/11/2019   Pulmonary edema 10/09/2019   Acute respiratory failure with hypoxia ([place]) 10/09/2019   Duodenal diverticulum, periampullary 11/03/2018   Diverticulosis of sigmoid colon 11/03/2018   Intestine with microperforation (Sigmoid vs Meckel's) 11/03/2018   Common bile duct stone 06/22/2017   HLD (hyperlipidemia) 06/22/2017   CKD (chronic kidney disease) stage 3, GFR 30-59 ml/min ([place]) 06/22/2017   Chronic pain 06/22/2017   Morbid obesity with BMI of 50.0-59.9, adult ([place]) 06/22/2017   Seizures ([place]) 06/22/2017   Type 2 diabetes mellitus, without long-term current use of insulin ([place]) 06/22/2017   RUQ pain    Bloating 11/04/2014   Hx of adenomatous colonic polyps 2016    Bilateral lower extremity edema 09/10/2013   Essential hypertension 09/10/2013    Past Surgical History:  Procedure Laterality Date   ABDOMINAL HYSTERECTOMY     APPENDECTOMY     CHOLECYSTECTOMY OPEN  1977   COLONOSCOPY WITH PROPOFOL N/A 10/16/2014   Procedure: COLONOSCOPY WITH PROPOFOL;  Surgeon: [name], MD;  Location: [facility] ENDOSCOPY;  Service: Endoscopy;  Laterality: N/A;   ENDOSCOPIC RETROGRADE CHOLANGIOPANCREATOGRAPHY (ERCP) WITH PROPOFOL N/A 06/02/2017   Procedure: ENDOSCOPIC RETROGRADE CHOLANGIOPANCREATOGRAPHY (ERCP)  WITH PROPOFOL;  Surgeon: [name], MD;  Location: [facility] ENDOSCOPY;  Service: Endoscopy;  Laterality: N/A;   TONSILLECTOMY       OB History   No obstetric history on file.     No family history on file.  Social History   Tobacco Use   Smoking status: Former    Packs/day: 1.00    Years: 20.00    Pack years: 20.00    Types: Cigarettes   Smokeless tobacco: Never   Tobacco comments:    quit smoking 30 yrs ago  Vaping Use   Vaping Use: Never used  Substance Use Topics   Alcohol use: No   Drug use: No    Home Medications Prior to Admission medications   Medication Sig Start Date End Date Taking? Authorizing Provider  acetaminophen (TYLENOL) 500 MG tablet Take 1 tablet (500 mg total) by mouth every 6 (six) hours as needed for moderate pain. 11/07/18   Raiford Noble Latif, DO  calcium carbonate (OSCAL) 1500 (600 Ca) MG TABS tablet Take 1,500 mg by mouth 2 (two) times daily with a meal.    [provider]  furosemide (LASIX) 20 MG tablet Take 1 tablet (20 mg total) by mouth 2 (two) times daily. 10/11/19 10/10/20  Little Ishikawa, MD  GARLIC HIGH POTENCY PO Take 1 capsule by mouth daily.    [provider]  lip balm (CARMEX) ointment Apply 1 application topically 2 (two) times daily. 11/07/18   Sheikh, Omair Latif, DO  losartan (COZAAR) 50 MG tablet Take 50 mg by mouth daily. 08/16/19   [provider]  lovastatin (MEVACOR) 20 MG tablet Take 20 mg by mouth at bedtime.    [provider]  Omega-3 Fatty Acids (FISH OIL PO) Take 2 capsules by mouth daily.    [provider]  Probiotic Product (ALIGN) 4 MG CAPS Take 4 mg by mouth daily.    [provider]    Allergies    Levaquin [levofloxacin in d5w]  Review of Systems   Review of Systems  Constitutional:  Negative for chills and fever.  HENT:  Negative for congestion, rhinorrhea and sore throat.   Eyes:  Negative for visual disturbance.  Respiratory:  Positive for cough and  shortness of breath.   Cardiovascular:  Positive for leg swelling. Negative for chest pain and palpitations.  Gastrointestinal:  Negative for abdominal pain, constipation, diarrhea, nausea and vomiting.  Genitourinary:  Negative for difficulty urinating, dysuria, frequency, hematuria and urgency.  Musculoskeletal:  Negative for back pain and neck pain.  Skin:  Negative for color change and rash.  Neurological:  Positive for dizziness. Negative for tremors, seizures, syncope, facial asymmetry, speech difficulty, weakness, light-headedness, numbness and headaches.  Psychiatric/Behavioral:  Negative for confusion.    Physical Exam Updated Vital Signs BP (!) 194/123 (BP Location: Right Wrist)   Pulse (!) 112   Temp 98 F (36.7 C)   Resp (!) 31   Ht 5\' 3"  (1.6 m)   Wt 117 kg   SpO2 92%   BMI 45.70 kg/m   Physical Exam Vitals and nursing  note reviewed.  Constitutional:      General: She is not in acute distress.    Appearance: She is not ill-appearing, toxic-appearing or diaphoretic.  HENT:     Head: Normocephalic.  Eyes:     General: No scleral icterus.       Right eye: No discharge.        Left eye: No discharge.  Cardiovascular:     Rate and Rhythm: Normal rate.  Pulmonary:     Effort: Tachypnea present. No bradypnea or respiratory distress.     Breath sounds: Normal breath sounds. No stridor.     Comments: Patient on 3 LPM of O2 BC negative consult cannula. Abdominal:     General: There is no distension. There are no signs of injury.     Palpations: Abdomen is soft. There is no mass or pulsatile mass.     Tenderness: There is no abdominal tenderness. There is no guarding or rebound.  Musculoskeletal:     Right lower leg: No deformity, lacerations, tenderness or bony tenderness. 1+ Edema present.     Left lower leg: No deformity, lacerations, tenderness or bony tenderness. 1+ Edema present.     Comments: +1 edema to bilateral lower extremities  Skin:    General: Skin is  warm and dry.  Neurological:     General: No focal deficit present.     Mental Status: She is alert.  Psychiatric:        Behavior: Behavior is cooperative.    ED Results / Procedures / Treatments   Labs (all labs ordered are listed, but only abnormal results are displayed) Labs Reviewed  CBC WITH DIFFERENTIAL/PLATELET - Abnormal; Notable for the following components:      Result Value   WBC 10.6 (*)    Hemoglobin 15.4 (*)    HCT 46.9 (*)    Neutro Abs 9.2 (*)    Abs Immature Granulocytes 0.16 (*)    All other components within normal limits  COMPREHENSIVE METABOLIC PANEL - Abnormal; Notable for the following components:   Glucose, Bld 135 (*)    BUN 29 (*)    Creatinine, Ser 1.31 (*)    GFR, Estimated 40 (*)    All other components within normal limits  BRAIN NATRIURETIC PEPTIDE - Abnormal; Notable for the following components:   B Natriuretic Peptide 418.1 (*)    All other components within normal limits  TROPONIN I (HIGH SENSITIVITY) - Abnormal; Notable for the following components:   Troponin I (High Sensitivity) 253 (*)    All other components within normal limits  RESP PANEL BY RT-PCR (FLU A&B, COVID) [id number]  CBG MONITORING, ED  TROPONIN I (HIGH SENSITIVITY)    EKG EKG Interpretation  Date/Time:  Friday April 30 2021 11:53:14 EDT Ventricular Rate:  113 PR Interval:  120 QRS Duration: 128 QT Interval:  362 QTC Calculation: 496 R Axis:   -55 Text Interpretation: Sinus tachycardia with occasional Premature ventricular complexes and Fusion complexes Left axis deviation Left bundle branch block Abnormal ECG No significant change since last tracing Confirmed by Fredia Sorrow [phone]) on 04/30/2021 2:02:14 PM  Radiology CT Angio Chest PE W and/or Wo Contrast  Result Date: 04/30/2021 CLINICAL DATA:  Evaluate for pulmonary embolus EXAM: CT ANGIOGRAPHY CHEST WITH CONTRAST TECHNIQUE: Multidetector CT imaging of the chest was performed using the standard protocol  during bolus administration of intravenous contrast. Multiplanar CT image reconstructions and MIPs were obtained to evaluate the vascular anatomy. CONTRAST:  55mL OMNIPAQUE IOHEXOL 350  MG/ML SOLN COMPARISON:  CT abdomen and pelvis dated April 29, 2017 FINDINGS: Cardiovascular: Pulmonary embolus seen in the left and right pulmonary arteries and in multiple bilateral lobar, segmental and subsegmental pulmonary arteries Mild cardiomegaly. Elevated RV to LV ratio, suggesting right heart strain. No pericardial effusion. Three-vessel coronary artery calcifications. Severe atherosclerotic disease of the thoracic aorta with areas of ulcerated soft plaque. Mediastinum/Nodes: Esophagus and thyroid are unremarkable. No pathologically enlarged lymph nodes seen in the chest. Lungs/Pleura: Central airways are patent. Expiratory phase imaging with air trapping and bilateral subsegmental atelectasis. No consolidation, pleural effusion or pneumothorax. Upper Abdomen: Cholecystectomy clips. Dilated common bile duct, unchanged compared to prior CT of the abdomen and pelvis. Simple cyst of the right kidney. No acute abnormality. Musculoskeletal: No chest wall abnormality. No acute or significant osseous findings. Review of the MIP images confirms the above findings. IMPRESSION: 1. Pulmonary embolus seen in the left and right pulmonary arteries and in multiple bilateral lobar, segmental and subsegmental pulmonary arteries. 2. CT evidence of right heart strain. Correlate with echocardiography. 3. Severe atherosclerotic disease of the thoracic aorta with areas of ulcerated soft plaque. Aortic Atherosclerosis (ICD10-I70.0). 4. Bilateral air trapping, likely due to small airways disease. 5. Three-vessel coronary artery calcifications. Critical Value/emergent results were called by telephone at the time of interpretation on 04/30/2021 at 4:55 pm to provider Isla Pence MD, who verbally acknowledged these results. Electronically Signed    By: Yetta Glassman M.D.   On: 04/30/2021 17:04   DG Chest Port 1 View  Result Date: 04/30/2021 CLINICAL DATA:  Shortness of breath EXAM: PORTABLE CHEST 1 VIEW COMPARISON:  Chest x-ray dated October 09, 2019 FINDINGS: Cardiac and mediastinal contours are unchanged. Low lung volumes with hypoventilatory changes. Mild bilateral heterogeneous opacities, possibly due to atelectasis. No focal consolidation, large pleural effusion or evidence of pneumothorax. IMPRESSION: Low lung volumes with hypoventilatory changes. Possible mild pulmonary edema versus atelectasis. No focal consolidation. Electronically Signed   By: Yetta Glassman M.D.   On: 04/30/2021 13:01    Procedures .Critical Care Performed by: Loni Beckwith, PA-C Authorized by: Loni Beckwith, PA-C   Critical care provider statement:    Critical care time (minutes):  45   Critical care was necessary to treat or prevent imminent or life-threatening deterioration of the following conditions:  Cardiac failure and respiratory failure   Critical care was time spent personally by me on the following activities:  Blood draw for specimens, development of treatment plan with patient or surrogate, evaluation of patient's response to treatment, ordering and performing treatments and interventions, ordering and review of laboratory studies, ordering and review of radiographic studies, pulse oximetry, re-evaluation of patient's condition and review of old charts   Care discussed with: admitting provider   Comments:     Critical care due to bilateral PE with heart strain requiring heparin   Medications Ordered in ED Medications  albuterol (VENTOLIN HFA) 108 (90 Base) MCG/ACT inhaler 2 puff (has no administration in time range)  heparin ADULT infusion 100 units/mL (25000 units/244mL) (1,450 Units/hr Intravenous New Bag/Given 04/30/21 1730)  hydrALAZINE (APRESOLINE) tablet 25 mg (25 mg Oral Given 04/30/21 1443)  furosemide (LASIX) injection  40 mg (40 mg Intravenous Given 04/30/21 1444)  iohexol (OMNIPAQUE) 350 MG/ML injection 100 mL (68 mLs Intravenous Contrast Given 04/30/21 1631)  heparin bolus via infusion 6,400 Units (6,400 Units Intravenous Bolus from Bag 04/30/21 1731)    ED Course  I have reviewed the triage vital signs and the nursing notes.  Pertinent labs & imaging results that were available during my care of the patient were reviewed by me and considered in my medical decision making (see chart for details).  Clinical Course as of 04/30/21 1736  Fri Apr 30, 2021  1632 Spoke to Dr. Raelyn Mora requested that a critical care be contacted to review submassive PE.  Will contact critical care at this time. [PB]  [id number] Spoke to provider with critical care who reports that patient can be admitted to critical care with admitting provider Dr. Roselie Awkward. [PB]    Clinical Course User Index [PB] Dyann Ruddle   MDM Rules/Calculators/A&P                           Alert 86 year old female no acute distress, nontoxic-appearing.  Presents to ED with chief complaint of shortness of breath.  Patient has history of CHF, hypertension, diabetes mellitus.  Patient recently diagnosed with COVID-19 on 10/14.  Patient reports noncompliance with Lasix medication x1 month.  Patient oxygen saturation 90% on 3 LPM of O2 via nasal cannula.  During interview oxygen was discontinued and oxygen saturations found to drop to 86 to 87% on room air while speaking.  Lungs clear to auscultation bilaterally.  Patient noted to have +1 edema to bilateral lower extremities.  Chest x-ray shows mild pulm edema.  We will give patient dose of Lasix to help for possible acute CHF.  Due to patient's recent COVID-19 infection as well as tachycardia concern for possible PE.  Will obtain CTA chest to evaluate further pending kidney function.  Patient noted to be hypertensive with BP 194/123.  Patient reports compliance with her Cozaar medication.   Last took this medication earlier this morning.  Denies any visual disturbance, headache, chest pain, numbness, weakness, facial asymmetry or dysarthria.  We will give patient hydralazine for blood pressure.  Patient noted to have BNP elevated 418, troponin elevated at 253.  Suspect this is due to heart strain, will obtain CTA chest to evaluate for PE.  Attending physician Dr. Lorretta Harp was contacted by radiologist with report from CTA showing bilateral PE with heart strain.  We will start patient on heparin.  Will call hospitalist to admit.  Patient was discussed with and evaluated by Dr. Gilford Raid.  Patient will be admitted to critical care team with admitting provider Dr. Roselie Awkward.    Final Clinical Impression(s) / ED Diagnoses Final diagnoses:  Acute pulmonary embolism, unspecified pulmonary embolism type, unspecified whether acute cor pulmonale present [facility])    Rx / DC Orders ED Discharge Orders     None        Dyann Ruddle 04/30/21 1737    Isla Pence, MD 04/30/21 2000

## 2021-04-30 NOTE — H&P (Signed)
NAME:  Pamela [name], MRN:  [id number], DOB:  1934/11/13, LOS: 0 ADMISSION DATE:  04/30/2021, CONSULTATION DATE:  04/30/2021 REFERRING MD:  Fredia Sorrow  CHIEF COMPLAINT:  Pulmonary Embolism  History of Present Illness:  86 yo female with known diastolic heart failure, diabetes mellitus, hypertension, CKD III admitted with pulmonary embolism with recent COVID diagnosis.  Patient was seen in PCP office on 10/14 for dyspnea with exertion, she was found to be + for COVID and started on Paxlovid. She was seen in follow up with her PCP and prescribed albuterol, guaifenesin, and prednisone for worsening symptoms.  Despite medical therapy, patient has continued to have increased dyspnea and SpO2 sats between 88% and 93% on room air.  She presented to urgent care for evaluation. VS on arrival:  SpO2 90% on 3 L [place], temp 98, HR: 115, BP 188/118. CTA Chest was completed and showed pulmonary embolism in the left and right pulmonary artery with segmental and subsegmental embolism.  RV:LV ratio reported as elevated. BNP 418, troponin HS 253>307.  She received Lasix 40 mg x 1, Hydralazine 26 mg PO x 1 and started on Heparin infusion with Heparin drip.   Pertinent  Medical History  Diastolic heart failure -ECHO 10/2019: LVEF 60-65%, no regional wall motion abnormalities, RV normal size and function, abnormal septal wall motion c/w LBBB, grade I diastolic dysfunction Non insulin dependent Diabetes Mellitus 2 Hypertension CKD, stage III  Significant Hospital Events: Including procedures, antibiotic start and stop dates in addition to other pertinent events   Admit 10/28, Heparin bolus and infusion  Interim History / Subjective:  New consult.  Subjective: Denies SOB, chest pain, calf tenderness. Endorses being sedentary at home.  Objective   Blood pressure (!) 177/108, pulse 83, temperature 97.7 F (36.5 C), temperature source Oral, resp. rate (!) 24, height 5\' 3"  (1.6 m), weight 117 kg, SpO2 91 %. 4  LNC        Intake/Output Summary (Last 24 hours) at 04/30/2021 2353 Last data filed at 04/30/2021 2157 Gross per 24 hour  Intake --  Output 2300 ml  Net -2300 ml   Filed Weights   04/30/21 1152  Weight: 117 kg    Examination: General: In bed, NAD, appears comfortable HEENT: MM pink/moist, anicteric, atraumatic Neuro: RASS 0, PERRL 8mm rt 58mm left (baseline per pt), GCS 15 CV: S1S2, NSR, no m/r/g appreciated PULM:  clear in the upper lobes, clear in the lower lobes, trachea midline, chest expansion symmetric GI: soft, bsx4 active, non-tender   Extremities: warm/dry, no pretibial edema, capillary refill less than 3 seconds  Skin:  no rashes or lesions noted  Resolved Hospital Problem list     Assessment & Plan:  Submassive pulmonary embolism Seen on CTA on 10/28, suspect secondary to COVID-19 infection. Also endorses increasingly being sedentary at home --CTA with bilateral pulmonary embolism, RV strain, elevated BNP and troponin.  --shock index:  ratio < 1 --Bleeding risk: moderate given age --Needs ECHO, INR, PTT, lactate and repeat troponin  --Recommend anticoagulation with continued Heparin drip.  Discussed with Dr. Hardin Negus --Admit to ICU overnight  COVID-19 Diagnosed 10/14, s/p paxlovid 5 day course, 3 days stereoids outpatient -Monitor respiratory status -Isolation per protocol -Supportive care at this time  Acute respiratory insufficiency Suspect multifactorial in setting of diastolic heart failure, COVID and Pulmonary embolism. On 4LNC --Supportive care with supplemental O2, wean to maintain SpO2 > 24%  HX Diastolic heart failure Hypertension HX Hyperlipidemia --Home meds: lasix (non-compliant x 1 month),  Losartan, Lovastatin --Goal SBP < 180 --Consider resuming home medication in AM depending on clinical course.  HX CKD, stage III --Baseline creatinine 1.5-2.2, creatinine at baseline --Continue supportive care  HX DM, type II --Start SSI  Best  Practice (right click and "Reselect all SmartList Selections" daily)   Diet/type: NPO DVT prophylaxis: systemic heparin GI prophylaxis: H2B Lines: N/A Foley:  N/A Code Status:  DNR/DNI Last date of multidisciplinary goals of care discussion [Patient stated she would not like any life sustaining measures including CPR and intubation and procedures]  Labs   CBC: Recent Labs  Lab 04/30/21 1423  WBC 10.6*  NEUTROABS 9.2*  HGB 15.4*  HCT 46.9*  MCV 94.0  PLT 628    Basic Metabolic Panel: Recent Labs  Lab 04/30/21 1423  NA 142  K 4.3  CL 108  CO2 26  GLUCOSE 135*  BUN 29*  CREATININE 1.31*  CALCIUM 9.0   GFR: Estimated Creatinine Clearance: 39.5 mL/min (A) (by C-G formula based on SCr of 1.31 mg/dL (H)). Recent Labs  Lab 04/30/21 1423  WBC 10.6*    Liver Function Tests: Recent Labs  Lab 04/30/21 1423  AST 16  ALT 23  ALKPHOS 76  BILITOT 0.7  PROT 7.3  ALBUMIN 3.7   No results for input(s): LIPASE, AMYLASE in the last 168 hours. No results for input(s): AMMONIA in the last 168 hours.  ABG    Component Value Date/Time   TCO2 25 04/29/2017 0702     Coagulation Profile: No results for input(s): INR, PROTIME in the last 168 hours.  Cardiac Enzymes: No results for input(s): CKTOTAL, CKMB, CKMBINDEX, TROPONINI in the last 168 hours.  HbA1C: Hgb A1c MFr Bld  Date/Time Value Ref Range Status  10/09/2019 07:47 PM 5.6 4.8 - 5.6 % Final    Comment:    (NOTE)         Prediabetes: 5.7 - 6.4         Diabetes: >6.4         Glycemic control for adults with diabetes: <7.0     CBG: Recent Labs  Lab 04/30/21 2320  GLUCAP 124*    Review of Systems:   Positives in bold  Gen: fever, chills, weight change, fatigue, night sweats HEENT:  blurred vision, double vision, hearing loss, tinnitus, sinus congestion, rhinorrhea, sore throat, neck stiffness, dysphagia PULM:  shortness of breath, cough, sputum production, hemoptysis, wheezing CV: chest pain,  edema, orthopnea, paroxysmal nocturnal dyspnea, palpitations GI:  abdominal pain, nausea, vomiting, diarrhea, hematochezia, melena, constipation, change in bowel habits GU: dysuria, hematuria, polyuria, oliguria, urethral discharge Endocrine: hot or cold intolerance, polyuria, polyphagia or appetite change Derm: rash, dry skin, scaling or peeling skin change Heme: easy bruising, bleeding, bleeding gums Neuro: headache, numbness, weakness, slurred speech, loss of memory or consciousness   Past Medical History:  She,  has a past medical history of Acute diastolic CHF (congestive heart failure) ([place]) (10/11/2019), Arthritis, Bilateral lower extremity edema, Common bile duct stone (06/22/2017), Diabetes ([place]), Diverticulosis, Duodenal diverticulum, periampullary (11/03/2018), GERD (gastroesophageal reflux disease), History of hiatal hernia, Hypertension, Kidney disease, chronic, stage III (GFR 30-59 ml/min) ([place]), Obesity, Osteopenia, Seizures ([place]) (22 yrs ago), and Tubular adenoma of colon (2009).   Surgical History:   Past Surgical History:  Procedure Laterality Date   ABDOMINAL HYSTERECTOMY     APPENDECTOMY     CHOLECYSTECTOMY OPEN  1977   COLONOSCOPY WITH PROPOFOL N/A 10/16/2014   Procedure: COLONOSCOPY WITH PROPOFOL;  Surgeon: [name], MD;  Location: [facility] ENDOSCOPY;  Service: Endoscopy;  Laterality: N/A;   ENDOSCOPIC RETROGRADE CHOLANGIOPANCREATOGRAPHY (ERCP) WITH PROPOFOL N/A 06/02/2017   Procedure: ENDOSCOPIC RETROGRADE CHOLANGIOPANCREATOGRAPHY (ERCP) WITH PROPOFOL;  Surgeon: [name], MD;  [facility] END[facility];  Service: Endoscopy;  Laterality: N/A;   TONSILLECTOMY       Social History:   reports that she has quit smoking. Her smoking use included cigarettes. She has a 20.00 pack-year smoking history. She has never used smokeless tobacco. She reports that she does not drink alcohol and does not use drugs.   Family History:  Her family history is not on file.    Allergies Allergies  Allergen Reactions   Levaquin [Levofloxacin In D5w] Nausea And Vomiting     Home Medications  Prior to Admission medications   Medication Sig Start Date End Date Taking? Authorizing Provider  acetaminophen (TYLENOL) 500 MG tablet Take 1 tablet (500 mg total) by mouth every 6 (six) hours as needed for moderate pain. 11/07/18   Raiford Noble Latif, DO  calcium carbonate (OSCAL) 1500 (600 Ca) MG TABS tablet Take 1,500 mg by mouth 2 (two) times daily with a meal.    [provider]  furosemide (LASIX) 20 MG tablet Take 1 tablet (20 mg total) by mouth 2 (two) times daily. 10/11/19 10/10/20  Little Ishikawa, MD  GARLIC HIGH POTENCY PO Take 1 capsule by mouth daily.    [provider]  lip balm (CARMEX) ointment Apply 1 application topically 2 (two) times daily. 11/07/18   Sheikh, Omair Latif, DO  losartan (COZAAR) 50 MG tablet Take 50 mg by mouth daily. 08/16/19   [provider]  lovastatin (MEVACOR) 20 MG tablet Take 20 mg by mouth at bedtime.    [provider]  Omega-3 Fatty Acids (FISH OIL PO) Take 2 capsules by mouth daily.    [provider]  Probiotic Product (ALIGN) 4 MG CAPS Take 4 mg by mouth daily.    [provider]     Critical care time: 35 minutes     Redmond School., MSN, APRN, AGACNP-BC [facility]  04/30/2021 , 11:53 PM  Please see [email or web address] for pager details  If no response, please call [phone] After hours, please call [place] at [phone]

## 2021-04-30 NOTE — Progress Notes (Signed)
ANTICOAGULATION CONSULT NOTE - Initial Consult  Pharmacy Consult for heparin Indication: pulmonary embolus  Allergies  Allergen Reactions   Levaquin [Levofloxacin In D5w] Nausea And Vomiting    Patient Measurements: Height: 5\' 3"  (160 cm) Weight: 117 kg (258 lb) IBW/kg (Calculated) : 52.4 Heparin Dosing Weight: 81 kg  Vital Signs: Temp: 98 F (36.7 C) (10/28 1152) BP: 189/105 (10/28 1615) Pulse Rate: 83 (10/28 1615)  Labs: Recent Labs    04/30/21 1423  HGB 15.4*  HCT 46.9*  PLT 153  CREATININE 1.31*  TROPONINIHS 253*    Estimated Creatinine Clearance: 39.5 mL/min (A) (by C-G formula based on SCr of 1.31 mg/dL (H)).   Medical History: Past Medical History:  Diagnosis Date   Acute diastolic CHF (congestive heart failure) ([place]) 10/11/2019   Arthritis    Bilateral lower extremity edema    left lower extremity cellulitis   Common bile duct stone 06/22/2017   Diabetes ([place])    Diverticulosis    Duodenal diverticulum, periampullary 11/03/2018   Aborted ERCP 2016   GERD (gastroesophageal reflux disease)    History of hiatal hernia    Hypertension    Kidney disease, chronic, stage III (GFR 30-59 ml/min) (HCC)    Obesity    Osteopenia    Seizures ([place]) 22 yrs ago   no cause found   Tubular adenoma of colon 2009    Medications:  see MAR  Assessment: 86 yo F with bilateral PE with RHS. No AC PTA. CBC baseline wnl.   Goal of Therapy:  Heparin level 0.3-0.7 units/ml Monitor platelets by anticoagulation protocol: Yes   Plan:  Give 6400 units bolus x 1 Start heparin infusion at 1450 units/hr Check anti-Xa level in 8 hours and daily while on heparin Continue to monitor H&H and platelets  Joetta Manners, PharmD, [facility] Emergency Medicine Clinical Pharmacist ED RPh Phone: [place]: [phone]

## 2021-04-30 NOTE — ED Notes (Signed)
Handoff report given to Nurse 70M at [facility]

## 2021-04-30 NOTE — ED Notes (Signed)
Called [facility] to advise that patient has a bed ready, [id number].  Patient is Covid +, O2 4L, Heparin Drip, CM

## 2021-04-30 NOTE — ED Notes (Signed)
[facility] at bedside 

## 2021-04-30 NOTE — ED Triage Notes (Signed)
Pt reports she was dx with Covid 10/14, started Paxlovid, completed the dose, recent repeat negative covid test. Pt began feeling "poor" starting Saturday, pt tested positive for Covid again this Monday. PCP started her on Prednisone, albuterol and Guaiatussin. Pt reports O2 sats ranging between 88-93% RA. Does not use at home O2. Pt in no distress, speaking complete sentences, states "I'm a little worried"  Pt denies hx of CHF

## 2021-04-30 NOTE — Progress Notes (Signed)
[facility] Physician-Brief Progress Note Patient Name: KRISTENA WILHELMI DOB: 1934-12-03 MRN: [id number]   Date of Service  04/30/2021  HPI/Events of Note  Patient recently diagnosed with Covid 19 infection and treated with Paxlovid, now admitted to ICU with acute hypoxemic respiratory failure secondary to sub-massive PE, she is on Heparin gtt per protocol.  eICU Interventions  New Patient Evaluation.        Kerry Kass Maryrose Colvin 04/30/2021, 11:58 PM

## 2021-05-01 ENCOUNTER — Other Ambulatory Visit (HOSPITAL_COMMUNITY): Payer: Medicare HMO

## 2021-05-01 ENCOUNTER — Inpatient Hospital Stay (HOSPITAL_COMMUNITY): Payer: Medicare HMO

## 2021-05-01 DIAGNOSIS — I2609 Other pulmonary embolism with acute cor pulmonale: Secondary | ICD-10-CM

## 2021-05-01 DIAGNOSIS — I2699 Other pulmonary embolism without acute cor pulmonale: Secondary | ICD-10-CM | POA: Diagnosis present

## 2021-05-01 LAB — ECHOCARDIOGRAM COMPLETE
AR max vel: 1.95 cm2
AV Area VTI: 1.86 cm2
AV Area mean vel: 1.97 cm2
AV Mean grad: 4 mmHg
AV Peak grad: 7.2 mmHg
Ao pk vel: 1.34 m/s
Area-P 1/2: 3.93 cm2
Height: 63 in
S' Lateral: 2.8 cm
Weight: 4141.12 oz

## 2021-05-01 LAB — BASIC METABOLIC PANEL
Anion gap: 10 (ref 5–15)
BUN: 28 mg/dL — ABNORMAL HIGH (ref 8–23)
CO2: 26 mmol/L (ref 22–32)
Calcium: 8.5 mg/dL — ABNORMAL LOW (ref 8.9–10.3)
Chloride: 104 mmol/L (ref 98–111)
Creatinine, Ser: 1.39 mg/dL — ABNORMAL HIGH (ref 0.44–1.00)
GFR, Estimated: 37 mL/min — ABNORMAL LOW (ref >60)
Glucose, Bld: 107 mg/dL — ABNORMAL HIGH (ref 70–99)
Potassium: 3.9 mmol/L (ref 3.5–5.1)
Sodium: 140 mmol/L (ref 135–145)

## 2021-05-01 LAB — HEPARIN LEVEL (UNFRACTIONATED)
Heparin Unfractionated: 0.95 IU/mL — ABNORMAL HIGH (ref 0.30–0.70)
Heparin Unfractionated: 0.75 IU/mL — ABNORMAL HIGH (ref 0.30–0.70)
Heparin Unfractionated: 0.6 IU/mL (ref 0.30–0.70)

## 2021-05-01 LAB — TROPONIN I (HIGH SENSITIVITY)
Troponin I (High Sensitivity): 326 ng/L (ref <18)
Troponin I (High Sensitivity): 281 ng/L (ref <18)

## 2021-05-01 LAB — PHOSPHORUS: Phosphorus: 3.6 mg/dL (ref 2.5–4.6)

## 2021-05-01 LAB — HEMOGLOBIN A1C
Hgb A1c MFr Bld: 6.1 % — ABNORMAL HIGH (ref 4.8–5.6)
Mean Plasma Glucose: 128.37 mg/dL

## 2021-05-01 LAB — CBC
HCT: 46.1 % — ABNORMAL HIGH (ref 36.0–46.0)
Hemoglobin: 15.3 g/dL — ABNORMAL HIGH (ref 12.0–15.0)
MCH: 30.7 pg (ref 26.0–34.0)
MCHC: 33.2 g/dL (ref 30.0–36.0)
MCV: 92.4 fL (ref 80.0–100.0)
Platelets: 155 10*3/uL (ref 150–400)
RBC: 4.99 MIL/uL (ref 3.87–5.11)
RDW: 14.1 % (ref 11.5–15.5)
WBC: 13.1 10*3/uL — ABNORMAL HIGH (ref 4.0–10.5)
nRBC: 0 % (ref 0.0–0.2)

## 2021-05-01 LAB — GLUCOSE, CAPILLARY
Glucose-Capillary: 103 mg/dL — ABNORMAL HIGH (ref 70–99)
Glucose-Capillary: 106 mg/dL — ABNORMAL HIGH (ref 70–99)
Glucose-Capillary: 113 mg/dL — ABNORMAL HIGH (ref 70–99)
Glucose-Capillary: 147 mg/dL — ABNORMAL HIGH (ref 70–99)
Glucose-Capillary: 138 mg/dL — ABNORMAL HIGH (ref 70–99)
Glucose-Capillary: 91 mg/dL (ref 70–99)

## 2021-05-01 LAB — MAGNESIUM: Magnesium: 1.8 mg/dL (ref 1.7–2.4)

## 2021-05-01 MED ORDER — ACETAMINOPHEN 500 MG PO TABS
1000.0000 mg | ORAL_TABLET | Freq: Four times a day (QID) | ORAL | Status: AC | PRN
  Administered 2021-05-01 – 2021-05-03 (×4): 1000 mg via ORAL
  Filled 2021-05-01 (×4): qty 2

## 2021-05-01 MED ORDER — CARVEDILOL 12.5 MG PO TABS
12.5000 mg | ORAL_TABLET | Freq: Two times a day (BID) | ORAL | Status: DC
  Administered 2021-05-01 – 2021-05-06 (×11): 12.5 mg via ORAL
  Filled 2021-05-01 (×11): qty 1

## 2021-05-01 MED ORDER — CHLORHEXIDINE GLUCONATE CLOTH 2 % EX PADS
6.0000 | MEDICATED_PAD | Freq: Every day | CUTANEOUS | Status: DC
  Administered 2021-05-01 (×2): 6 via TOPICAL

## 2021-05-01 MED ORDER — POLYETHYLENE GLYCOL 3350 17 G PO PACK: 17.0000 g | PACK | Freq: Every day | ORAL | Status: DC | PRN

## 2021-05-01 MED ORDER — ONDANSETRON HCL 4 MG/2ML IJ SOLN: 4.0000 mg | Freq: Four times a day (QID) | INTRAMUSCULAR | Status: DC | PRN

## 2021-05-01 MED ORDER — LOSARTAN POTASSIUM 50 MG PO TABS
50.0000 mg | ORAL_TABLET | Freq: Every day | ORAL | Status: DC
  Filled 2021-05-01: qty 1

## 2021-05-01 MED ORDER — INSULIN ASPART 100 UNIT/ML IJ SOLN
0.0000 [IU] | INTRAMUSCULAR | Status: DC
  Administered 2021-05-01 (×2): 2 [IU] via SUBCUTANEOUS
  Administered 2021-05-02: 3 [IU] via SUBCUTANEOUS
  Administered 2021-05-03 (×2): 2 [IU] via SUBCUTANEOUS
  Administered 2021-05-05: 3 [IU] via SUBCUTANEOUS

## 2021-05-01 MED ORDER — DOCUSATE SODIUM 100 MG PO CAPS: 100.0000 mg | ORAL_CAPSULE | Freq: Two times a day (BID) | ORAL | Status: DC | PRN

## 2021-05-01 NOTE — Progress Notes (Signed)
  Echocardiogram 2D Echocardiogram has been performed.  Merrie Roof F 05/01/2021, 12:47 PM

## 2021-05-01 NOTE — Progress Notes (Signed)
ANTICOAGULATION CONSULT NOTE  Pharmacy Consult for heparin Indication: pulmonary embolus  Allergies  Allergen Reactions   Levaquin [Levofloxacin In D5w] Nausea And Vomiting    Patient Measurements: Height: 5\' 3"  (160 cm) Weight: 117 kg (258 lb) IBW/kg (Calculated) : 52.4 Heparin Dosing Weight: 81 kg  Vital Signs: Temp: 97.7 F (36.5 C) (10/29 0000) Temp Source: Oral (10/29 0000) BP: 172/151 (10/29 0200) Pulse Rate: 75 (10/29 0200)  Labs: Recent Labs    04/30/21 1423 04/30/21 1650 05/01/21 0310  HGB 15.4*  --  15.3*  HCT 46.9*  --  46.1*  PLT 153  --  155  HEPARINUNFRC  --   --  0.95*  CREATININE 1.31*  --   --   TROPONINIHS 253* 307*  --      Estimated Creatinine Clearance: 39.5 mL/min (A) (by C-G formula based on SCr of 1.31 mg/dL (H)).   Medical History: Past Medical History:  Diagnosis Date   Acute diastolic CHF (congestive heart failure) ([place]) 10/11/2019   Arthritis    Bilateral lower extremity edema    left lower extremity cellulitis   Common bile duct stone 06/22/2017   Diabetes ([place])    Diverticulosis    Duodenal diverticulum, periampullary 11/03/2018   Aborted ERCP 2016   GERD (gastroesophageal reflux disease)    History of hiatal hernia    Hypertension    Kidney disease, chronic, stage III (GFR 30-59 ml/min) (HCC)    Obesity    Osteopenia    Seizures ([place]) 22 yrs ago   no cause found   Tubular adenoma of colon 2009    Assessment: 86 yo F with bilateral PE with RHS. No AC PTA. CBC baseline wnl.   Heparin level supratherapeutic (0.95) on gtt at 1450 units/hr. No bleeding reported per RN.  Goal of Therapy:  Heparin level 0.3-0.7 units/ml Monitor platelets by anticoagulation protocol: Yes   Plan:  Decrease heparin infusion to 1300 units/hr Will f/u 8 hr heparin level  Sherlon Handing, PharmD, BCPS Please see amion for complete clinical pharmacist phone list 05/01/2021 4:09 AM

## 2021-05-01 NOTE — Progress Notes (Signed)
Date and time results received: 05/01/21 1012   Test: Troponin Critical Value: 326  Name of Provider Notified: Dr. Silas Flood  Orders Received? Or Actions Taken?: Orders Received - See Orders for details

## 2021-05-01 NOTE — Progress Notes (Signed)
[facility] Progress Note Patient Name: Pamela [name] DOB: 15-May-1935 MRN: [id number]   Date of Service  05/01/2021  HPI/Events of Note  Patient asking for extra strength Tylenol home medications to be ordered, she takes it for chronic arthritis.  eICU Interventions  PRN extra strength Tylenol ordered.        Kerry Kass Pamela [name] 05/01/2021, 11:34 PM

## 2021-05-01 NOTE — Progress Notes (Signed)
ANTICOAGULATION CONSULT NOTE  Pharmacy Consult for heparin Indication: pulmonary embolus  Allergies  Allergen Reactions   Levaquin [Levofloxacin In D5w] Nausea And Vomiting    Patient Measurements: Height: 5\' 3"  (160 cm) Weight: 117.4 kg (258 lb 13.1 oz) IBW/kg (Calculated) : 52.4 Heparin Dosing Weight: 81 kg  Vital Signs: Temp: 97.9 F (36.6 C) (10/29 1933) Temp Source: Axillary (10/29 1933) BP: 146/84 (10/29 2000) Pulse Rate: 62 (10/29 2000)  Labs: Recent Labs    04/30/21 1423 04/30/21 1650 05/01/21 0310 05/01/21 0737 05/01/21 0953 05/01/21 1159 05/01/21 2002  HGB 15.4*  --  15.3*  --   --   --   --   HCT 46.9*  --  46.1*  --   --   --   --   PLT 153  --  155  --   --   --   --   HEPARINUNFRC  --   --  0.95*  --   --  0.75* 0.60  CREATININE 1.31*  --  1.39*  --   --   --   --   TROPONINIHS 253* 307*  --  326* 281*  --   --     Estimated Creatinine Clearance: 37.3 mL/min (A) (by C-G formula based on SCr of 1.39 mg/dL (H)).   Medical History: Past Medical History:  Diagnosis Date   Acute diastolic CHF (congestive heart failure) ([place]) 10/11/2019   Arthritis    Bilateral lower extremity edema    left lower extremity cellulitis   Common bile duct stone 06/22/2017   Diabetes ([place])    Diverticulosis    Duodenal diverticulum, periampullary 11/03/2018   Aborted ERCP 2016   GERD (gastroesophageal reflux disease)    History of hiatal hernia    Hypertension    Kidney disease, chronic, stage III (GFR 30-59 ml/min) (HCC)    Obesity    Osteopenia    Seizures ([place]) 22 yrs ago   no cause found   Tubular adenoma of colon 2009    Assessment:  86 yo F with bilateral PE with RHS.  No anticoagulation prior to admission. Pharmacy consulted for heparin.    Heparin level 0.6 is therapeutic on 1200 units/hr. Down trending appropriately with rate decrease.   Goal of Therapy:  Heparin level 0.3-0.7 units/ml Monitor platelets by anticoagulation protocol: Yes   Plan:   Continue heparin infusion 1200 units/hr Monitor daily HL, CBC/plt Monitor for signs/symptoms of bleeding    Benetta Spar, PharmD, BCPS, BCCP Clinical Pharmacist  Please check AMION for all [place] phone numbers After 10:00 PM, call [place] [phone]

## 2021-05-01 NOTE — H&P (Signed)
NAME:  Pamela [name], MRN:  [id number], DOB:  Feb 15, 1935, LOS: 1 ADMISSION DATE:  04/30/2021, CONSULTATION DATE:  04/30/2021 REFERRING MD:  Fredia Sorrow  CHIEF COMPLAINT:  Pulmonary Embolism  History of Present Illness:  86 yo female with known diastolic heart failure, diabetes mellitus, hypertension, CKD III admitted with pulmonary embolism with recent COVID diagnosis.  Patient was seen in PCP office on 10/14 for dyspnea with exertion, she was found to be + for COVID and started on Paxlovid. She was seen in follow up with her PCP and prescribed albuterol, guaifenesin, and prednisone for worsening symptoms.  Despite medical therapy, patient has continued to have increased dyspnea and SpO2 sats between 88% and 93% on room air.  She presented to urgent care for evaluation. VS on arrival:  SpO2 90% on 3 L [place], temp 98, HR: 115, BP 188/118. CTA Chest was completed and showed pulmonary embolism in the left and right pulmonary artery with segmental and subsegmental embolism.  RV:LV ratio reported as elevated. BNP 418, troponin HS 253>307.  She received Lasix 40 mg x 1, Hydralazine 26 mg PO x 1 and started on Heparin infusion with Heparin drip.   Pertinent  Medical History  Diastolic heart failure -ECHO 10/2019: LVEF 60-65%, no regional wall motion abnormalities, RV normal size and function, abnormal septal wall motion c/w LBBB, grade I diastolic dysfunction Non insulin dependent Diabetes Mellitus 2 Hypertension CKD, stage III  Significant Hospital Events: Including procedures, antibiotic start and stop dates in addition to other pertinent events   Admit 10/28, Heparin bolus and infusion  Interim History / Subjective:  NAEON. Feels well, requiring 4L O2. No bleeding issues.  Explained concern for submassive PE and rationale for ongoing monitoring.  TTE ordered for further evaluation.  Discussed the need for outpatient follow-up with me if elevated pulmonary pressures. Objective   Blood pressure  (!) 160/85, pulse 73, temperature 97.6 F (36.4 C), temperature source Oral, resp. rate 18, height 5\' 3"  (1.6 m), weight 117.4 kg, SpO2 94 %. 4 LNC        Intake/Output Summary (Last 24 hours) at 05/01/2021 1045 Last data filed at 05/01/2021 0600 Gross per 24 hour  Intake 239.5 ml  Output 2300 ml  Net -2060.5 ml    Filed Weights   04/30/21 1152 05/01/21 0500  Weight: 117 kg 117.4 kg    Examination: General: In bed, NAD, appears comfortable HEENT: MM pink/moist, anicteric, atraumatic Neuro: RASS 0, PERRL  CV: S1S2, NSR, no m/r/g appreciated PULM:  clear in the upper lobes, clear in the lower lobes, trachea midline, chest expansion symmetric GI: soft, bsx4 active, non-tender   Extremities: warm/dry, no pretibial edema, capillary refill less than 3 seconds  Skin:  no rashes or lesions noted  Chest x-ray 10/28 with low lung volumes, clear, CTA PE protocol 10/28 with mild mosaicism versus central mild pulmonary edema, overall large clot burden bilaterally in the main and segmental pulmonary arteries  Resolved Hospital Problem list     Assessment & Plan:  Submassive pulmonary embolism Seen on CTA on 10/28, suspect secondary to COVID-19 infection. Also endorses increasingly being sedentary at home. Troponin, BNP leak to qualify for submassive --CTA with bilateral pulmonary embolism, RV strain, elevated BNP and troponin.  --TTE --heparin gtt, transition to [place] in next 24 to 48 hours --transfer to progressive  COVID-19 Diagnosed 10/14, s/p paxlovid 5 day course, 3 days stereoids outpatient -Monitor respiratory status -d/c isolation given 10+ days out and improved symptoms -Supportive care at  this time  Acute hypoxemic  respiratory insufficiency Related to acute PE, no O2 use at home --Supportive care with supplemental O2, wean to maintain SpO2 > 56%  Diastolic heart failure Hypertension  Hyperlipidemia --Home meds: lasix (non-compliant x 1 month), coreg --Goal SBP <  180 --Coreg 12.5 BID, home dose unknown - states not taking losartan  CKD, stage IIIb -- creatinine at baseline --Continue supportive care  DM, type II --SSI  Best Practice (right click and "Reselect all SmartList Selections" daily)   Diet/type: Regular consistency (see orders) DVT prophylaxis: systemic heparin GI prophylaxis: H2B Lines: N/A Foley:  N/A Code Status:  DNR/DNI Last date of multidisciplinary goals of care discussion [Patient stated she would not like any life sustaining measures including CPR and intubation and procedures]  Labs   CBC: Recent Labs  Lab 04/30/21 1423 05/01/21 0310  WBC 10.6* 13.1*  NEUTROABS 9.2*  --   HGB 15.4* 15.3*  HCT 46.9* 46.1*  MCV 94.0 92.4  PLT 153 155     Basic Metabolic Panel: Recent Labs  Lab 04/30/21 1423 05/01/21 0310  NA 142 140  K 4.3 3.9  CL 108 104  CO2 26 26  GLUCOSE 135* 107*  BUN 29* 28*  CREATININE 1.31* 1.39*  CALCIUM 9.0 8.5*  MG  --  1.8  PHOS  --  3.6    GFR: Estimated Creatinine Clearance: 37.3 mL/min (A) (by C-G formula based on SCr of 1.39 mg/dL (H)). Recent Labs  Lab 04/30/21 1423 05/01/21 0310  WBC 10.6* 13.1*     Liver Function Tests: Recent Labs  Lab 04/30/21 1423  AST 16  ALT 23  ALKPHOS 76  BILITOT 0.7  PROT 7.3  ALBUMIN 3.7    No results for input(s): LIPASE, AMYLASE in the last 168 hours. No results for input(s): AMMONIA in the last 168 hours.  ABG    Component Value Date/Time   TCO2 25 04/29/2017 0702      Coagulation Profile: No results for input(s): INR, PROTIME in the last 168 hours.  Cardiac Enzymes: No results for input(s): CKTOTAL, CKMB, CKMBINDEX, TROPONINI in the last 168 hours.  HbA1C: Hgb A1c MFr Bld  Date/Time Value Ref Range Status  05/01/2021 03:10 AM 6.1 (H) 4.8 - 5.6 % Final    Comment:    (NOTE) Pre diabetes:          5.7%-6.4%  Diabetes:              >6.4%  Glycemic control for   <7.0% adults with diabetes   10/09/2019 07:47 PM  5.6 4.8 - 5.6 % Final    Comment:    (NOTE)         Prediabetes: 5.7 - 6.4         Diabetes: >6.4         Glycemic control for adults with diabetes: <7.0     CBG: Recent Labs  Lab 04/30/21 2320 05/01/21 0349 05/01/21 0730  GLUCAP 124* 103* 106*     Review of Systems:   N/a   Past Medical History:  She,  has a past medical history of Acute diastolic CHF (congestive heart failure) ([place]) (10/11/2019), Arthritis, Bilateral lower extremity edema, Common bile duct stone (06/22/2017), Diabetes ([place]), Diverticulosis, Duodenal diverticulum, periampullary (11/03/2018), GERD (gastroesophageal reflux disease), History of hiatal hernia, Hypertension, Kidney disease, chronic, stage III (GFR 30-59 ml/min) ([place]), Obesity, Osteopenia, Seizures ([place]) (22 yrs ago), and Tubular adenoma of colon (2009).   Surgical History:   Past Surgical  History:  Procedure Laterality Date   ABDOMINAL HYSTERECTOMY     APPENDECTOMY     CHOLECYSTECTOMY OPEN  1977   COLONOSCOPY WITH PROPOFOL N/A 10/16/2014   Procedure: COLONOSCOPY WITH PROPOFOL;  Surgeon: [name], MD;  Location: [facility] ENDOSCOPY;  Service: Endoscopy;  Laterality: N/A;   ENDOSCOPIC RETROGRADE CHOLANGIOPANCREATOGRAPHY (ERCP) WITH PROPOFOL N/A 06/02/2017   Procedure: ENDOSCOPIC RETROGRADE CHOLANGIOPANCREATOGRAPHY (ERCP) WITH PROPOFOL;  Surgeon: [name], MD;  Location[facility] ENDOSCOPY;  Service: Endoscopy;  Laterality: N/A;   TONSILLECTOMY       Social History:   reports that she has quit smoking. Her smoking use included cigarettes. She has a 20.00 pack-year smoking history. She has never used smokeless tobacco. She reports that she does not drink alcohol and does not use drugs.   Family History:  Her family history is not on file.   Allergies Allergies  Allergen Reactions   Levaquin [Levofloxacin In D5w] Nausea And Vomiting     Home Medications  Prior to Admission medications   Medication Sig Start Date End Date Taking? Authorizing  Provider  acetaminophen (TYLENOL) 500 MG tablet Take 1 tablet (500 mg total) by mouth every 6 (six) hours as needed for moderate pain. 11/07/18   Raiford Noble Latif, DO  calcium carbonate (OSCAL) 1500 (600 Ca) MG TABS tablet Take 1,500 mg by mouth 2 (two) times daily with a meal.    [provider]  furosemide (LASIX) 20 MG tablet Take 1 tablet (20 mg total) by mouth 2 (two) times daily. 10/11/19 10/10/20  Little Ishikawa, MD  GARLIC HIGH POTENCY PO Take 1 capsule by mouth daily.    [provider]  lip balm (CARMEX) ointment Apply 1 application topically 2 (two) times daily. 11/07/18   Sheikh, Omair Latif, DO  losartan (COZAAR) 50 MG tablet Take 50 mg by mouth daily. 08/16/19   [provider]  lovastatin (MEVACOR) 20 MG tablet Take 20 mg by mouth at bedtime.    [provider]  Omega-3 Fatty Acids (FISH OIL PO) Take 2 capsules by mouth daily.    [provider]  Probiotic Product (ALIGN) 4 MG CAPS Take 4 mg by mouth daily.    [provider]     Critical care time:n/a     Lanier Clam, MD  05/01/2021 , 10:45 AM  Please see [email or web address] for contact details  If no response, please call [phone] After hours, please call Elink at [phone]

## 2021-05-01 NOTE — Care Management (Signed)
Benefit check submitted for DOACs. Will result on Monday.  Patient can be sent home with 30 day free card for either Eliquis or Xaralto at time of discharge.

## 2021-05-01 NOTE — Progress Notes (Addendum)
ANTICOAGULATION CONSULT NOTE  Pharmacy Consult for heparin Indication: pulmonary embolus  Allergies  Allergen Reactions   Levaquin [Levofloxacin In D5w] Nausea And Vomiting    Patient Measurements: Height: 5\' 3"  (160 cm) Weight: 117.4 kg (258 lb 13.1 oz) IBW/kg (Calculated) : 52.4 Heparin Dosing Weight: 81 kg  Vital Signs: Temp: 97.8 F (36.6 C) (10/29 1100) Temp Source: Axillary (10/29 1100) BP: 141/95 (10/29 1300) Pulse Rate: 71 (10/29 1300)  Labs: Recent Labs    04/30/21 1423 04/30/21 1650 05/01/21 0310 05/01/21 0737 05/01/21 0953 05/01/21 1159  HGB 15.4*  --  15.3*  --   --   --   HCT 46.9*  --  46.1*  --   --   --   PLT 153  --  155  --   --   --   HEPARINUNFRC  --   --  0.95*  --   --  0.75*  CREATININE 1.31*  --  1.39*  --   --   --   TROPONINIHS 253* 307*  --  326* 281*  --     Estimated Creatinine Clearance: 37.3 mL/min (A) (by C-G formula based on SCr of 1.39 mg/dL (H)).   Medical History: Past Medical History:  Diagnosis Date   Acute diastolic CHF (congestive heart failure) ([place]) 10/11/2019   Arthritis    Bilateral lower extremity edema    left lower extremity cellulitis   Common bile duct stone 06/22/2017   Diabetes ([place])    Diverticulosis    Duodenal diverticulum, periampullary 11/03/2018   Aborted ERCP 2016   GERD (gastroesophageal reflux disease)    History of hiatal hernia    Hypertension    Kidney disease, chronic, stage III (GFR 30-59 ml/min) ([facility])    Obesity    Osteopenia    Seizures ([place]) 22 yrs ago   no cause found   Tubular adenoma of colon 2009    Assessment: 86 yo F with bilateral PE with RHS. No AC PTA. CBC baseline wnl.   Heparin level is still slightly supratherapeutic (0.75) on gtt at 1300 units/hr.  No bleeding reported per RN.  Goal of Therapy:  Heparin level 0.3-0.7 units/ml Monitor platelets by anticoagulation protocol: Yes   Plan:  Decrease heparin infusion to 1200 units/hr Will f/u 8 hr heparin level  Sloan Leiter, PharmD, BCPS, BCCCP Clinical Pharmacist Please refer to [facility] for [place] numbers 05/01/2021 1:54 PM

## 2021-05-02 ENCOUNTER — Encounter (HOSPITAL_COMMUNITY): Payer: Self-pay | Admitting: Pulmonary Disease

## 2021-05-02 DIAGNOSIS — U071 COVID-19: Secondary | ICD-10-CM | POA: Diagnosis not present

## 2021-05-02 DIAGNOSIS — I2609 Other pulmonary embolism with acute cor pulmonale: Secondary | ICD-10-CM | POA: Diagnosis not present

## 2021-05-02 LAB — CBC
HCT: 44 % (ref 36.0–46.0)
Hemoglobin: 14.4 g/dL (ref 12.0–15.0)
MCH: 30.5 pg (ref 26.0–34.0)
MCHC: 32.7 g/dL (ref 30.0–36.0)
MCV: 93.2 fL (ref 80.0–100.0)
Platelets: 150 10*3/uL (ref 150–400)
RBC: 4.72 MIL/uL (ref 3.87–5.11)
RDW: 14.1 % (ref 11.5–15.5)
WBC: 12.8 10*3/uL — ABNORMAL HIGH (ref 4.0–10.5)
nRBC: 0 % (ref 0.0–0.2)

## 2021-05-02 LAB — GLUCOSE, CAPILLARY
Glucose-Capillary: 107 mg/dL — ABNORMAL HIGH (ref 70–99)
Glucose-Capillary: 101 mg/dL — ABNORMAL HIGH (ref 70–99)
Glucose-Capillary: 136 mg/dL — ABNORMAL HIGH (ref 70–99)
Glucose-Capillary: 142 mg/dL — ABNORMAL HIGH (ref 70–99)
Glucose-Capillary: 177 mg/dL — ABNORMAL HIGH (ref 70–99)
Glucose-Capillary: 131 mg/dL — ABNORMAL HIGH (ref 70–99)

## 2021-05-02 LAB — HEPARIN LEVEL (UNFRACTIONATED): Heparin Unfractionated: 0.36 IU/mL (ref 0.30–0.70)

## 2021-05-02 MED ORDER — PRAVASTATIN SODIUM 10 MG PO TABS
20.0000 mg | ORAL_TABLET | Freq: Every day | ORAL | Status: DC
  Administered 2021-05-02 – 2021-05-05 (×4): 20 mg via ORAL
  Filled 2021-05-02 (×4): qty 2

## 2021-05-02 MED ORDER — ACETAMINOPHEN 500 MG PO TABS
500.0000 mg | ORAL_TABLET | Freq: Four times a day (QID) | ORAL | Status: DC | PRN
  Administered 2021-05-06 (×2): 500 mg via ORAL
  Filled 2021-05-02 (×2): qty 1

## 2021-05-02 MED ORDER — LOSARTAN POTASSIUM 50 MG PO TABS
50.0000 mg | ORAL_TABLET | Freq: Every day | ORAL | Status: DC
  Administered 2021-05-02 – 2021-05-03 (×2): 50 mg via ORAL
  Filled 2021-05-02 (×2): qty 1

## 2021-05-02 MED ORDER — OXYCODONE HCL 5 MG PO TABS
5.0000 mg | ORAL_TABLET | Freq: Four times a day (QID) | ORAL | Status: AC | PRN
  Administered 2021-05-02 (×3): 5 mg via ORAL
  Filled 2021-05-02 (×4): qty 1

## 2021-05-02 MED ORDER — FUROSEMIDE 10 MG/ML IJ SOLN
40.0000 mg | Freq: Once | INTRAMUSCULAR | Status: AC
  Administered 2021-05-02: 40 mg via INTRAVENOUS
  Filled 2021-05-02: qty 4

## 2021-05-02 MED ORDER — PREDNISONE 20 MG PO TABS
20.0000 mg | ORAL_TABLET | Freq: Every day | ORAL | Status: AC
  Administered 2021-05-02 – 2021-05-03 (×2): 20 mg via ORAL
  Filled 2021-05-02 (×2): qty 1

## 2021-05-02 MED ORDER — HYDROMORPHONE HCL 1 MG/ML IJ SOLN: 0.5000 mg | Freq: Once | INTRAMUSCULAR | Status: DC

## 2021-05-02 MED ORDER — CARVEDILOL 3.125 MG PO TABS: 3.1250 mg | ORAL_TABLET | Freq: Two times a day (BID) | ORAL | Status: DC

## 2021-05-02 MED ORDER — DAPAGLIFLOZIN PROPANEDIOL 10 MG PO TABS
10.0000 mg | ORAL_TABLET | Freq: Every day | ORAL | Status: DC
  Administered 2021-05-02 – 2021-05-03 (×2): 10 mg via ORAL
  Filled 2021-05-02 (×3): qty 1

## 2021-05-02 NOTE — Progress Notes (Signed)
eLink Physician-Brief Progress Note Patient Name: Pamela [name] DOB: January 11, 1935 MRN: [id number]   Date of Service  05/02/2021  HPI/Events of Note  Patient c/o breakthrough chronic arthritis knee pain despite extra strength Tylenol, no evidence of knee  joint inflammation, and patient is fully heparinized for PE Rx.  eICU Interventions  PRN Oxycodone ordered for breakthrough pain.        Sherrilynn Gudgel U Alexzandria Massman 05/02/2021, 2:04 AM

## 2021-05-02 NOTE — Progress Notes (Signed)
PROGRESS NOTE    Pamela [name]  WRU:[id number] DOB: 29-Nov-1934 DOA: 04/30/2021 PCP: Orpah Melter, MD   Brief Narrative:  86 yo female with known diastolic heart failure, diabetes mellitus, hypertension, CKD III admitted with pulmonary embolism with recent COVID diagnosis.   Patient was seen in PCP office on 10/14 for dyspnea with exertion, she was found to be + for COVID and started on Paxlovid. She was seen in follow up with her PCP and prescribed albuterol, guaifenesin, and prednisone for worsening symptoms.  Despite medical therapy, patient has continued to have increased dyspnea and SpO2 sats between 88% and 93% on room air.  She presented to urgent care for evaluation. VS on arrival:  SpO2 90% on 3 L [place], temp 98, HR: 115, BP 188/118. CTA Chest was completed and showed pulmonary embolism in the left and right pulmonary artery with segmental and subsegmental embolism.  RV:LV ratio reported as elevated. BNP 418, troponin HS 253>307.  She received Lasix 40 mg x 1, Hydralazine 26 mg PO x 1 and started on Heparin infusion with Heparin drip.   Assessment & Plan:   Active Problems:   Bilateral pulmonary embolism (HCC)   Acute pulmonary embolism ([place])   COVID-19 virus infection   Pulmonary embolism (HCC)  Acute hypoxic respiratory failure secondary to submassive pulmonary embolism: Seen on CTA on 10/28, suspect secondary to COVID-19 infection. Also endorses increasingly being sedentary at home. Troponin elevated, likely due to demand ischemia.  The same reason.  BNP also elevated due to echo shows RV strain with global hypokinesis.  We will repeat EKG, she has no chest pain or shortness of breath.  Continue heparin drip for another 24 hours as she is still on 5 L of oxygen and saturating around 88%.  Will order incentive spirometry.  COVID-19: Has history of it, not an active infection currently. Diagnosed 10/14, s/p paxlovid 5 day course, 3 days stereoids outpatient -Monitor respiratory  status.  Not on isolation since post 10 days positive test. -Supportive care at this time  Acute on chronic diastolic congestive heart failure: Chest x-ray shows possible pulm edema, BNP also elevated, likely exacerbated due to PE.  We will try Lasix 40 mg IV x1, reassess tomorrow.  Hypertension: Slightly elevated.  Resume Cozaar.   Hyperlipidemia: Resume statin.  CKD, stage IIIb: Creatinine at baseline.  DM, type II: Controlled.  Hemoglobin A1c 6.1.  Continue SSI.  Chronic arthritis of bilateral knee: Continue current pain medications.  DVT prophylaxis: SCDs Start: 05/01/21 0003   Code Status: DNR  Family Communication:  None present at bedside.  Plan of care discussed with patient in length and he verbalized understanding and agreed with it.  Status is: Inpatient  Remains inpatient appropriate because: Needs close monitoring and management of submassive PE  Estimated body mass index is 46.12 kg/m as calculated from the following:   Height as of this encounter: 5\' 3"  (1.6 m).   Weight as of this encounter: 118.1 kg.     Nutritional Assessment: Body mass index is 46.12 kg/m.Marland Kitchen Seen by dietician.  I agree with the assessment and plan as outlined below: Nutrition Status:  Skin Assessment: I have examined the patient's skin and I agree with the wound assessment as performed by the wound care RN as outlined below:    Consultants:  None  Procedures:  None  Antimicrobials:  Anti-infectives (From admission, onward)    None          Subjective: Patient seen and examined.  She complains of acute on chronic bilateral knee pain due to arthritis.  She had no other complaint.  Despite of being on 5 L, she had no shortness of breath or chest pain.  Objective: Vitals:   05/02/21 0700 05/02/21 0800 05/02/21 0900 05/02/21 1000  BP:  (!) 169/100    Pulse: 69 72 70 61  Resp: (!) 22 16 (!) 21 (!) 29  Temp:      TempSrc:      SpO2: 91% 94% 91% (!) 89%  Weight:       Height:        Intake/Output Summary (Last 24 hours) at 05/02/2021 1109 Last data filed at 05/02/2021 0800 Gross per 24 hour  Intake 1071.92 ml  Output 75 ml  Net 996.92 ml   Filed Weights   04/30/21 1152 05/01/21 0500 05/02/21 0500  Weight: 117 kg 117.4 kg 118.1 kg    Examination:  General exam: Appears calm and comfortable, morbidly obese Respiratory system: Clear to auscultation. Respiratory effort normal. Cardiovascular system: S1 & S2 heard, RRR. No JVD, murmurs, rubs, gallops or clicks. No pedal edema. Gastrointestinal system: Abdomen is nondistended, soft and nontender. No organomegaly or masses felt. Normal bowel sounds heard. Central nervous system: Alert and oriented. No focal neurological deficits. Extremities: Symmetric 5 x 5 power. Skin: No rashes, lesions or ulcers Psychiatry: Judgement and insight appear normal. Mood & affect appropriate.    Data Reviewed: I have personally reviewed following labs and imaging studies  CBC: Recent Labs  Lab 04/30/21 1423 05/01/21 0310 05/02/21 0203  WBC 10.6* 13.1* 12.8*  NEUTROABS 9.2*  --   --   HGB 15.4* 15.3* 14.4  HCT 46.9* 46.1* 44.0  MCV 94.0 92.4 93.2  PLT 153 155 657   Basic Metabolic Panel: Recent Labs  Lab 04/30/21 1423 05/01/21 0310  NA 142 140  K 4.3 3.9  CL 108 104  CO2 26 26  GLUCOSE 135* 107*  BUN 29* 28*  CREATININE 1.31* 1.39*  CALCIUM 9.0 8.5*  MG  --  1.8  PHOS  --  3.6   GFR: Estimated Creatinine Clearance: 37.4 mL/min (A) (by C-G formula based on SCr of 1.39 mg/dL (H)). Liver Function Tests: Recent Labs  Lab 04/30/21 1423  AST 16  ALT 23  ALKPHOS 76  BILITOT 0.7  PROT 7.3  ALBUMIN 3.7   No results for input(s): LIPASE, AMYLASE in the last 168 hours. No results for input(s): AMMONIA in the last 168 hours. Coagulation Profile: No results for input(s): INR, PROTIME in the last 168 hours. Cardiac Enzymes: No results for input(s): CKTOTAL, CKMB, CKMBINDEX, TROPONINI in the  last 168 hours. BNP (last 3 results) No results for input(s): PROBNP in the last 8760 hours. HbA1C: Recent Labs    05/01/21 0310  HGBA1C 6.1*   CBG: Recent Labs  Lab 05/01/21 1510 05/01/21 1910 05/01/21 2320 05/02/21 0347 05/02/21 0715  GLUCAP 147* 138* 91 107* 101*   Lipid Profile: No results for input(s): CHOL, HDL, LDLCALC, TRIG, CHOLHDL, LDLDIRECT in the last 72 hours. Thyroid Function Tests: No results for input(s): TSH, T4TOTAL, FREET4, T3FREE, THYROIDAB in the last 72 hours. Anemia Panel: No results for input(s): VITAMINB12, FOLATE, FERRITIN, TIBC, IRON, RETICCTPCT in the last 72 hours. Sepsis Labs: No results for input(s): PROCALCITON, LATICACIDVEN in the last 168 hours.  Recent Results (from the past 240 hour(s))  Resp Panel by RT-PCR (Flu A&B, Covid) Nasopharyngeal Swab     Status: Abnormal   Collection Time: 04/30/21  4:50 PM   Specimen: Nasopharyngeal Swab; Nasopharyngeal(NP) swabs in vial transport medium  Result Value Ref Range Status   SARS Coronavirus 2 by RT PCR POSITIVE (A) NEGATIVE Final    Comment: RESULT CALLED TO, READ BACK BY AND VERIFIED WITH: Veneda Melter 1806 04/30/2021 DBRADLEY (NOTE) SARS-CoV-2 target nucleic acids are DETECTED.  The SARS-CoV-2 RNA is generally detectable in upper respiratory specimens during the acute phase of infection. Positive results are indicative of the presence of the identified virus, but do not rule out bacterial infection or co-infection with other pathogens not detected by the test. Clinical correlation with patient history and other diagnostic information is necessary to determine patient infection status. The expected result is Negative.  Fact Sheet for Patients: EntrepreneurPulse.com.au  Fact Sheet for Healthcare Providers: IncredibleEmployment.be  This test is not yet approved or cleared by the [place] FDA and  has been authorized for detection and/or diagnosis  of SARS-CoV-2 by FDA under an Emergency Use Authorization (EUA).  This EUA will remain in effect (meaning this test c an be used) for the duration of  the COVID-19 declaration under Section 564(b)(1) of the Act, 21 U.S.C. section 360bbb-3(b)(1), unless the authorization is terminated or revoked sooner.     Influenza A by PCR NEGATIVE NEGATIVE Final   Influenza B by PCR NEGATIVE NEGATIVE Final    Comment: (NOTE) The Xpert Xpress SARS-CoV-2/FLU/RSV plus assay is intended as an aid in the diagnosis of influenza from Nasopharyngeal swab specimens and should not be used as a sole basis for treatment. Nasal washings and aspirates are unacceptable for Xpert Xpress SARS-CoV-2/FLU/RSV testing.  Fact Sheet for Patients: EntrepreneurPulse.com.au  Fact Sheet for Healthcare Providers: IncredibleEmployment.be  This test is not yet approved or cleared by the [place] FDA and has been authorized for detection and/or diagnosis of SARS-CoV-2 by FDA under an Emergency Use Authorization (EUA). This EUA will remain in effect (meaning this test can be used) for the duration of the COVID-19 declaration under Section 564(b)(1) of the Act, 21 U.S.C. section 360bbb-3(b)(1), unless the authorization is terminated or revoked.  Performed at [facility], [address], [place], [place] [address]       Radiology Studies: CT Angio Chest PE W and/or Wo Contrast  Result Date: 04/30/2021 CLINICAL DATA:  Evaluate for pulmonary embolus EXAM: CT ANGIOGRAPHY CHEST WITH CONTRAST TECHNIQUE: Multidetector CT imaging of the chest was performed using the standard protocol during bolus administration of intravenous contrast. Multiplanar CT image reconstructions and MIPs were obtained to evaluate the vascular anatomy. CONTRAST:  40mL OMNIPAQUE IOHEXOL 350 MG/ML SOLN COMPARISON:  CT abdomen and pelvis dated April 29, 2017 FINDINGS: Cardiovascular: Pulmonary  embolus seen in the left and right pulmonary arteries and in multiple bilateral lobar, segmental and subsegmental pulmonary arteries Mild cardiomegaly. Elevated RV to LV ratio, suggesting right heart strain. No pericardial effusion. Three-vessel coronary artery calcifications. Severe atherosclerotic disease of the thoracic aorta with areas of ulcerated soft plaque. Mediastinum/Nodes: Esophagus and thyroid are unremarkable. No pathologically enlarged lymph nodes seen in the chest. Lungs/Pleura: Central airways are patent. Expiratory phase imaging with air trapping and bilateral subsegmental atelectasis. No consolidation, pleural effusion or pneumothorax. Upper Abdomen: Cholecystectomy clips. Dilated common bile duct, unchanged compared to prior CT of the abdomen and pelvis. Simple cyst of the right kidney. No acute abnormality. Musculoskeletal: No chest wall abnormality. No acute or significant osseous findings. Review of the MIP images confirms the above findings. IMPRESSION: 1. Pulmonary embolus seen in the left and right  pulmonary arteries and in multiple bilateral lobar, segmental and subsegmental pulmonary arteries. 2. CT evidence of right heart strain. Correlate with echocardiography. 3. Severe atherosclerotic disease of the thoracic aorta with areas of ulcerated soft plaque. Aortic Atherosclerosis (ICD10-I70.0). 4. Bilateral air trapping, likely due to small airways disease. 5. Three-vessel coronary artery calcifications. Critical Value/emergent results were called by telephone at the time of interpretation on 04/30/2021 at 4:55 pm to provider Isla Pence MD, who verbally acknowledged these results. Electronically Signed   By: Yetta Glassman M.D.   On: 04/30/2021 17:04   DG Chest Port 1 View  Result Date: 04/30/2021 CLINICAL DATA:  Shortness of breath EXAM: PORTABLE CHEST 1 VIEW COMPARISON:  Chest x-ray dated October 09, 2019 FINDINGS: Cardiac and mediastinal contours are unchanged. Low lung volumes  with hypoventilatory changes. Mild bilateral heterogeneous opacities, possibly due to atelectasis. No focal consolidation, large pleural effusion or evidence of pneumothorax. IMPRESSION: Low lung volumes with hypoventilatory changes. Possible mild pulmonary edema versus atelectasis. No focal consolidation. Electronically Signed   By: Yetta Glassman M.D.   On: 04/30/2021 13:01   ECHOCARDIOGRAM COMPLETE  Result Date: 05/01/2021    ECHOCARDIOGRAM REPORT   Patient Name:   MIKAELYN ARTHURS Date of Exam: 05/01/2021 Medical Rec #:  [id number]      Height:       63.0 in Accession #:    [id number]     Weight:       258.8 lb Date of Birth:  1934-08-25       BSA:          2.158 m Patient Age:    38 years       BP:           155/92 mmHg Patient Gender: F              HR:           73 bpm. Exam Location:  Inpatient Procedure: 2D Echo, Color Doppler and Cardiac Doppler Indications:    Pulmonary embolism  History:        Patient has prior history of Echocardiogram examinations, most                 recent 10/10/2019. CT 04/30/21 which showed right heart strain;                 Risk Factors:Diabetes and Dyslipidemia. Covid 19 twice. CKD.  Sonographer:    Merrie Roof RDCS Referring Phys: [place]  1. Qualitatively, Right ventricular systolic function is mild to moderately reduced; TAPSE and tissue Doppler not performed. The right ventricular size is moderately enlarged. There is normal pulmonary artery systolic pressure. The estimated right ventricular systolic pressure is 29.5 mmHg.  2. Left ventricular ejection fraction, by estimation, is 55 to 60%. The left ventricle has normal function. The left ventricle demonstrates regional wall motion abnormalities (abnormal septal motion- there may be related to RV dysfunction). There is moderate concentric left ventricular hypertrophy. Left ventricular diastolic parameters are consistent with Grade I diastolic dysfunction (impaired relaxation). There is the  interventricular septum is flattened in systole and diastole, consistent with right ventricular pressure and volume overload.  3. The mitral valve is normal in structure. No evidence of mitral valve regurgitation. No evidence of mitral stenosis.  4. The aortic valve is tricuspid. Aortic valve regurgitation is not visualized. No aortic stenosis is present. Comparison(s): A prior study was performed on 10/10/2019. RV dysfunction is new. FINDINGS  Left Ventricle: Left  ventricular ejection fraction, by estimation, is 55 to 60%. The left ventricle has normal function. The left ventricle demonstrates regional wall motion abnormalities. The left ventricular internal cavity size was normal in size. There is moderate concentric left ventricular hypertrophy. The interventricular septum is flattened in systole and diastole, consistent with right ventricular pressure and volume overload. Left ventricular diastolic parameters are consistent with Grade I  diastolic dysfunction (impaired relaxation). Right Ventricle: The right ventricular size is moderately enlarged. Right vetricular wall thickness was not well visualized. Right ventricular systolic function is moderately reduced. There is normal pulmonary artery systolic pressure. The tricuspid regurgitant velocity is 2.83 m/s, and with an assumed right atrial pressure of 3 mmHg, the estimated right ventricular systolic pressure is 36.6 mmHg. Left Atrium: Left atrial size was normal in size. Right Atrium: Right atrial size was normal in size. Pericardium: There is no evidence of pericardial effusion. Mitral Valve: The mitral valve is normal in structure. No evidence of mitral valve regurgitation. No evidence of mitral valve stenosis. Tricuspid Valve: The tricuspid valve is normal in structure. Tricuspid valve regurgitation is mild. Aortic Valve: The aortic valve is tricuspid. Aortic valve regurgitation is not visualized. No aortic stenosis is present. Aortic valve mean gradient  measures 4.0 mmHg. Aortic valve peak gradient measures 7.2 mmHg. Aortic valve area, by VTI measures 1.86 cm. Pulmonic Valve: The pulmonic valve was normal in structure. Pulmonic valve regurgitation is trivial. Aorta: The aortic root and ascending aorta are structurally normal, with no evidence of dilitation. IAS/Shunts: The atrial septum is grossly normal.  LEFT VENTRICLE PLAX 2D LVIDd:         4.00 cm   Diastology LVIDs:         2.80 cm   LV e' medial:    5.82 cm/s LV PW:         1.40 cm   LV E/e' medial:  11.3 LV IVS:        1.20 cm   LV e' lateral:   7.42 cm/s LVOT diam:     1.90 cm   LV E/e' lateral: 8.8 LV SV:         51 LV SV Index:   24 LVOT Area:     2.84 cm  RIGHT VENTRICLE          IVC RV Basal diam:  3.60 cm  IVC diam: 2.00 cm LEFT ATRIUM             Index        RIGHT ATRIUM           Index LA diam:        4.50 cm 2.09 cm/m   RA Area:     19.80 cm LA Vol (A2C):   70.3 ml 32.58 ml/m  RA Volume:   58.60 ml  27.16 ml/m LA Vol (A4C):   40.4 ml 18.70 ml/m LA Biplane Vol: 66.9 ml 31.01 ml/m  AORTIC VALVE AV Area (Vmax):    1.95 cm AV Area (Vmean):   1.97 cm AV Area (VTI):     1.86 cm AV Vmax:           134.00 cm/s AV Vmean:          89.100 cm/s AV VTI:            0.273 m AV Peak Grad:      7.2 mmHg AV Mean Grad:      4.0 mmHg LVOT Vmax:  92.20 cm/s LVOT Vmean:        61.800 cm/s LVOT VTI:          0.179 m LVOT/AV VTI ratio: 0.66  AORTA Ao Root diam: 3.50 cm Ao Asc diam:  3.10 cm MITRAL VALVE                TRICUSPID VALVE MV Area (PHT): 3.93 cm     TR Peak grad:   32.0 mmHg MV Decel Time: 193 msec     TR Vmax:        283.00 cm/s MV E velocity: 65.50 cm/s MV A velocity: 119.00 cm/s  SHUNTS MV E/A ratio:  0.55         Systemic VTI:  0.18 m                             Systemic Diam: 1.90 cm Rudean Haskell MD Electronically signed by Rudean Haskell MD Signature Date/Time: 05/01/2021/1:05:03 PM    Final     Scheduled Meds:  carvedilol  12.5 mg Oral BID WC   Chlorhexidine Gluconate  Cloth  6 each Topical Daily   insulin aspart  0-15 Units Subcutaneous Q4H   Continuous Infusions:  heparin 1,200 Units/hr (05/02/21 0800)     LOS: 2 days   Time spent: 35 minutes   Darliss Cheney, MD [facility]  05/02/2021, 11:09 AM  Please page via [facility] and do not message via secure chat for anything urgent. Secure chat can be used for anything non urgent.  How to contact the [facility] Attending or Consulting provider [place] or covering provider during after hours [place], for this patient?  Check the care team in [facility] and look for a) attending/consulting [facility] provider listed and b) the [facility] team listed. Page or secure chat [phone]. Log into [email or web address] and use [employer] universal password to access. If you do not have the password, please contact the hospital operator. Locate the [facility] provider you are looking for under [facility] and page to a number that you can be directly reached. If you still have difficulty reaching the provider, please page the [facility] (Director on Call) for the Hospitalists listed on amion for assistance.

## 2021-05-02 NOTE — Progress Notes (Signed)
Dawn for heparin Indication: pulmonary embolus  Allergies  Allergen Reactions   Levaquin [Levofloxacin In D5w] Nausea And Vomiting    Patient Measurements: Height: 5\' 3"  (160 cm) Weight: 118.1 kg (260 lb 5.8 oz) IBW/kg (Calculated) : 52.4 Heparin Dosing Weight: 81 kg  Vital Signs: Temp: 97.7 F (36.5 C) (10/30 0405) Temp Source: Oral (10/30 0405) BP: 169/100 (10/30 0800) Pulse Rate: 61 (10/30 1000)  Labs: Recent Labs    04/30/21 1423 04/30/21 1423 04/30/21 1650 05/01/21 0310 05/01/21 0737 05/01/21 0953 05/01/21 1159 05/01/21 2002 05/02/21 0203  HGB 15.4*  --   --  15.3*  --   --   --   --  14.4  HCT 46.9*  --   --  46.1*  --   --   --   --  44.0  PLT 153  --   --  155  --   --   --   --  150  HEPARINUNFRC  --    < >  --  0.95*  --   --  0.75* 0.60 0.36  CREATININE 1.31*  --   --  1.39*  --   --   --   --   --   TROPONINIHS 253*  --  307*  --  326* 281*  --   --   --    < > = values in this interval not displayed.    Estimated Creatinine Clearance: 37.4 mL/min (A) (by C-G formula based on SCr of 1.39 mg/dL (H)).   Medical History: Past Medical History:  Diagnosis Date   Acute diastolic CHF (congestive heart failure) ([place]) 10/11/2019   Arthritis    Bilateral lower extremity edema    left lower extremity cellulitis   Common bile duct stone 06/22/2017   Diabetes ([place])    Diverticulosis    Duodenal diverticulum, periampullary 11/03/2018   Aborted ERCP 2016   GERD (gastroesophageal reflux disease)    History of hiatal hernia    Hypertension    Kidney disease, chronic, stage III (GFR 30-59 ml/min) (HCC)    Obesity    Osteopenia    Seizures ([place]) 22 yrs ago   no cause found   Tubular adenoma of colon 2009    Assessment:  86 yo F with bilateral PE with RHS.  No anticoagulation prior to admission. Pharmacy consulted for heparin.    Heparin level remains therapeutic on 1200 units/hr.  Level trending down. Still on 5L  and sats 89-94.    Goal of Therapy:  Heparin level 0.3-0.7 units/ml Monitor platelets by anticoagulation protocol: Yes   Plan:  Increase heparin infusion to 1250 units/hr to keep in goal Monitor daily HL, CBC/plt Monitor for signs/symptoms of bleeding    Sloan Leiter, PharmD, BCPS, BCCCP Clinical Pharmacist Please refer to [employer] for Lucky numbers 05/02/2021, 1:02 PM

## 2021-05-02 NOTE — Progress Notes (Signed)
17:43- Report attempted  18:10- report given

## 2021-05-03 DIAGNOSIS — I2699 Other pulmonary embolism without acute cor pulmonale: Secondary | ICD-10-CM | POA: Diagnosis not present

## 2021-05-03 DIAGNOSIS — I2609 Other pulmonary embolism with acute cor pulmonale: Secondary | ICD-10-CM | POA: Diagnosis not present

## 2021-05-03 LAB — CBC
HCT: 44.6 % (ref 36.0–46.0)
Hemoglobin: 14.5 g/dL (ref 12.0–15.0)
MCH: 30.2 pg (ref 26.0–34.0)
MCHC: 32.5 g/dL (ref 30.0–36.0)
MCV: 92.9 fL (ref 80.0–100.0)
Platelets: 143 10*3/uL — ABNORMAL LOW (ref 150–400)
RBC: 4.8 MIL/uL (ref 3.87–5.11)
RDW: 13.7 % (ref 11.5–15.5)
WBC: 12.9 10*3/uL — ABNORMAL HIGH (ref 4.0–10.5)
nRBC: 0 % (ref 0.0–0.2)

## 2021-05-03 LAB — BASIC METABOLIC PANEL
Anion gap: 7 (ref 5–15)
BUN: 33 mg/dL — ABNORMAL HIGH (ref 8–23)
CO2: 27 mmol/L (ref 22–32)
Calcium: 8.4 mg/dL — ABNORMAL LOW (ref 8.9–10.3)
Chloride: 103 mmol/L (ref 98–111)
Creatinine, Ser: 1.63 mg/dL — ABNORMAL HIGH (ref 0.44–1.00)
GFR, Estimated: 31 mL/min — ABNORMAL LOW (ref >60)
Glucose, Bld: 121 mg/dL — ABNORMAL HIGH (ref 70–99)
Potassium: 3.7 mmol/L (ref 3.5–5.1)
Sodium: 137 mmol/L (ref 135–145)

## 2021-05-03 LAB — GLUCOSE, CAPILLARY
Glucose-Capillary: 106 mg/dL — ABNORMAL HIGH (ref 70–99)
Glucose-Capillary: 131 mg/dL — ABNORMAL HIGH (ref 70–99)
Glucose-Capillary: 122 mg/dL — ABNORMAL HIGH (ref 70–99)
Glucose-Capillary: 111 mg/dL — ABNORMAL HIGH (ref 70–99)

## 2021-05-03 LAB — HEPARIN LEVEL (UNFRACTIONATED): Heparin Unfractionated: 0.32 IU/mL (ref 0.30–0.70)

## 2021-05-03 MED ORDER — OXYCODONE HCL 5 MG PO TABS
5.0000 mg | ORAL_TABLET | Freq: Four times a day (QID) | ORAL | Status: DC | PRN
  Administered 2021-05-03 – 2021-05-06 (×7): 5 mg via ORAL
  Filled 2021-05-03 (×7): qty 1

## 2021-05-03 MED ORDER — FUROSEMIDE 10 MG/ML IJ SOLN
40.0000 mg | Freq: Two times a day (BID) | INTRAMUSCULAR | Status: AC
  Administered 2021-05-03 (×2): 40 mg via INTRAVENOUS
  Filled 2021-05-03 (×2): qty 4

## 2021-05-03 NOTE — Progress Notes (Signed)
ANTICOAGULATION CONSULT NOTE  Pharmacy Consult for heparin Indication: pulmonary embolus  Allergies  Allergen Reactions   Levaquin [Levofloxacin In D5w] Nausea And Vomiting    Patient Measurements: Height: 5\' 3"  (160 cm) Weight: 118.1 kg (260 lb 5.8 oz) IBW/kg (Calculated) : 52.4 Heparin Dosing Weight: 81 kg  Vital Signs: Temp: 98.1 F (36.7 C) (10/31 1254) Temp Source: Oral (10/31 1254) BP: 120/53 (10/31 1254) Pulse Rate: 69 (10/31 1254)  Labs: Recent Labs    04/30/21 1423 04/30/21 1650 05/01/21 0310 05/01/21 0737 05/01/21 0953 05/01/21 1159 05/01/21 2002 05/02/21 0203 05/03/21 0120  HGB 15.4*  --  15.3*  --   --   --   --  14.4 14.5  HCT 46.9*  --  46.1*  --   --   --   --  44.0 44.6  PLT 153  --  155  --   --   --   --  150 143*  HEPARINUNFRC  --   --  0.95*  --   --    < > 0.60 0.36 0.32  CREATININE 1.31*  --  1.39*  --   --   --   --   --  1.63*  TROPONINIHS 253* 307*  --  326* 281*  --   --   --   --    < > = values in this interval not displayed.     Estimated Creatinine Clearance: 31.9 mL/min (A) (by C-G formula based on SCr of 1.63 mg/dL (H)).   Medical History: Past Medical History:  Diagnosis Date   Acute diastolic CHF (congestive heart failure) ([place]) 10/11/2019   Arthritis    Bilateral lower extremity edema    left lower extremity cellulitis   Common bile duct stone 06/22/2017   Diabetes ([place])    Diverticulosis    Duodenal diverticulum, periampullary 11/03/2018   Aborted ERCP 2016   GERD (gastroesophageal reflux disease)    History of hiatal hernia    Hypertension    Kidney disease, chronic, stage III (GFR 30-59 ml/min) (HCC)    Obesity    Osteopenia    Seizures ([place]) 22 yrs ago   no cause found   Tubular adenoma of colon 2009    Assessment:  86 yo F with bilateral PE with RHS.  No anticoagulation prior to admission. Pharmacy consulted for heparin.    Heparin level remains therapeutic on 1250 units/hr.  No overt bleeding or  complications noted.  Goal of Therapy:  Heparin level 0.3-0.7 units/ml Monitor platelets by anticoagulation protocol: Yes   Plan:  Continue IV Heparin at current rate of 1250 units/hr. Daily heparin level and CBC. Monitor for signs/symptoms of bleeding   [place] Crane, Roylene Reason, BCCP Clinical Pharmacist  05/03/2021 1:08 PM   [facility] pharmacy phone numbers are listed on [email or web address]

## 2021-05-03 NOTE — Evaluation (Signed)
Physical Therapy Evaluation Patient Details Name: Pamela [name] MRN: [id number] DOB: July 24, 1934 Today's Date: 05/03/2021  History of Present Illness  Pt is a 86 y.o. F who presents 04/30/2021 with acute hypoxic respiratory failure secondary to submassive pulmonary embolism in setting of recent COVID-19 infection. Significant PMH: diastolic HF, diabetes mellitus, HTN, CKD III.  Clinical Impression  Pt admitted with above. Prior to admission, pt lives with her spouse, is a limited household ambulator using a 3 wheeled walker, and is independent with ADL's. Pt presents with significant change from her functional baseline due to generalized weakness (particularly BLE's), decreased respiratory function, bilateral knee pain, and decreased activity tolerance. Pt unable to stand up to a walker from an elevated bed height. Utilized Denna Haggard (lift equipment) to work on pre transfer training, where pt rose to partial standing position x 8. SpO2 85% on 2L O2, increased to 92% on 3L O2. Currently, recommending SNF to address deficits and maximize functional mobility.      Recommendations for follow up therapy are one component of a multi-disciplinary discharge planning process, led by the attending physician.  Recommendations may be updated based on patient status, additional functional criteria and insurance authorization.  Follow Up Recommendations Skilled nursing-short term rehab (<3 hours/day)    Assistance Recommended at Discharge Intermittent Supervision/Assistance  Functional Status Assessment Patient has had a recent decline in their functional status and demonstrates the ability to make significant improvements in function in a reasonable and predictable amount of time.   Equipment Recommendations  3in1 (bariatric);Other (comment);Hospital bed (hoyer lift)    Recommendations for Other Services       Precautions / Restrictions Precautions Precautions: Fall;Other (comment) Precaution Comments:  watch O2 Restrictions Weight Bearing Restrictions: No      Mobility  Bed Mobility Overal bed mobility: Needs Assistance Bed Mobility: Supine to Sit;Sit to Supine     Supine to sit: Min assist Sit to supine: Mod assist   General bed mobility comments: Increased time effort bringing BLE's to edge of bed, light assist at trunk to execute upright. Assist for BLE elevation back into bed    Transfers Overall transfer level: Needs assistance Equipment used: Ambulation equipment used Transfers: Sit to/from Stand Sit to Stand: Mod assist           General transfer comment: Pt performed x8 partial stands in [place] with modA, cues for "nose over toes," rocking to gain momentum and foot placement. Pt unable to achieve full trunk extension Transfer via Lift Equipment: Stedy  Ambulation/Gait                Stairs            Wheelchair Mobility    Modified Rankin (Stroke Patients Only)       Balance Overall balance assessment: Needs assistance Sitting-balance support: Feet supported Sitting balance-Leahy Scale: Fair                                       Pertinent Vitals/Pain Pain Assessment: Faces Faces Pain Scale: Hurts even more Pain Location: bilateral knees (L > R) Pain Descriptors / Indicators: Aching Pain Intervention(s): Limited activity within patient's tolerance;Monitored during session    [place] expects to be discharged to:: Private residence Living Arrangements: Spouse/significant other Available Help at Discharge: Family Type of Home: House Home Access: Stairs to enter   [employer] of Steps: 1   Home  Layout: One level Home Equipment: Other (comment);Wheelchair - manual;Shower seat (3 wheeled walker)      Prior Function Prior Level of Function : Independent/Modified Independent             Mobility Comments: uses 3 wheeled walker for household ambulation, wheelchair for community  mobility       Hand Dominance        Extremity/Trunk Assessment   Upper Extremity Assessment Upper Extremity Assessment: Overall WFL for tasks assessed    Lower Extremity Assessment Lower Extremity Assessment: Generalized weakness       Communication   Communication: No difficulties  Cognition Arousal/Alertness: Awake/alert Behavior During Therapy: WFL for tasks assessed/performed Overall Cognitive Status: Within Functional Limits for tasks assessed                                          General Comments      Exercises General Exercises - Lower Extremity Ankle Circles/Pumps: AROM;Both;10 reps;Supine Long Arc Quad: AROM;Both;5 reps;Seated Heel Slides: AROM;Both;10 reps;Supine   Assessment/Plan    PT Assessment Patient needs continued PT services  PT Problem List Decreased strength;Decreased balance;Decreased activity tolerance;Decreased mobility;Pain       PT Treatment Interventions DME instruction;Gait training;Functional mobility training;Therapeutic activities;Therapeutic exercise;Balance training;Patient/family education    PT Goals (Current goals can be found in the Care Plan section)  Acute Rehab PT Goals Patient Stated Goal: to go home PT Goal Formulation: With patient/family Time For Goal Achievement: 05/17/21 Potential to Achieve Goals: Fair    Frequency Min 3X/week   Barriers to discharge        Co-evaluation               AM-PAC PT "6 Clicks" Mobility  Outcome Measure Help needed turning from your back to your side while in a flat bed without using bedrails?: A Little Help needed moving from lying on your back to sitting on the side of a flat bed without using bedrails?: A Little Help needed moving to and from a bed to a chair (including a wheelchair)?: A Lot Help needed standing up from a chair using your arms (e.g., wheelchair or bedside chair)?: A Lot Help needed to walk in hospital room?: Total Help needed  climbing 3-5 steps with a railing? : Total 6 Click Score: 12    End of Session Equipment Utilized During Treatment: Gait belt;Oxygen Activity Tolerance: Patient tolerated treatment well Patient left: in bed;with call bell/phone within reach;with bed alarm set;with family/visitor present Nurse Communication: Mobility status PT Visit Diagnosis: Muscle weakness (generalized) (M62.81);Difficulty in walking, not elsewhere classified (R26.2)    Time: 1610-9604 PT Time Calculation (min) (ACUTE ONLY): 41 min   Charges:   PT Evaluation $PT Eval Moderate Complexity: 1 Mod PT Treatments $Therapeutic Activity: 23-37 mins        Wyona Almas, PT, DPT Acute Rehabilitation Services Pager [phone] Office [phone]   Deno Etienne 05/03/2021, 10:36 AM

## 2021-05-03 NOTE — TOC Benefit Eligibility Note (Signed)
Transition of Care [facility]) Benefit Eligibility Note    Patient Details  Name: Pamela [name] MRN: [id number] Date of Birth: May 12, 1935   Medication/Dose: Eliquis 5mg . bid for 30 days (eliquis does not come in 10mg .) Xarelto20mg . Daily (Xarelto  PA required  for 15mg . BID @ [phone].)  Covered?: Yes  Tier: 3 Drug  Prescription Coverage [facility]erred [facility]: [facility]  Spoke with Person/Company/Phone Number:: Jilda Roche. [place] [phone]  Co-Pay: $47.00  Prior Approval: No  Deductible:  (no -dedutible)       Shelda Altes Phone Number: 05/03/2021, 11:10 AM

## 2021-05-03 NOTE — Progress Notes (Signed)
Mobility Specialist: Progress Note   05/03/21 1519  Mobility  Activity Sat and stood x 3  Level of Assistance Moderate assist, patient does 50-74%  Assistive Device Stedy  Mobility  (Sit to stand at EOB)  Mobility Response Tolerated well  Mobility performed by Mobility specialist  $Mobility charge 1 Mobility   Pre-Mobility: 79 HR, 95% SpO2 During Mobility: 77 HR, 94% SpO2 Post-Mobility: 75 HR, 93% SpO2  Pt on 3 L/min [place] throughout session. Pt performed sit to stand x3 with c/o bilateral knee pain she rated 5-6/10. Had pt march in place for first two bouts lasting 15-20 seconds each and stand stationary for the third bout lasting roughly one minute. Pt back to bed after session with pt's husband present in the room and call bell at her side.   [facility] Donna Snooks Mobility Specialist Mobility Specialist Phone: [phone]

## 2021-05-03 NOTE — Progress Notes (Signed)
PROGRESS NOTE    Pamela [name]  WLN:[id number] DOB: 1935-06-24 DOA: 04/30/2021 PCP: Orpah Melter, MD   Brief Narrative:  86 yo female with known diastolic heart failure, diabetes mellitus, hypertension, CKD III admitted with pulmonary embolism with recent COVID diagnosis.   Patient was seen in PCP office on 10/14 for dyspnea with exertion, she was found to be + for COVID and started on Paxlovid. She was seen in follow up with her PCP and prescribed albuterol, guaifenesin, and prednisone for worsening symptoms.  Despite medical therapy, patient has continued to have increased dyspnea and SpO2 sats between 88% and 93% on room air.  She presented to urgent care for evaluation. VS on arrival:  SpO2 90% on 3 L [place], temp 98, HR: 115, BP 188/118. CTA Chest was completed and showed pulmonary embolism in the left and right pulmonary artery with segmental and subsegmental embolism.  RV:LV ratio reported as elevated. BNP 418, troponin HS 253>307.  She received Lasix 40 mg x 1, Hydralazine 26 mg PO x 1 and started on Heparin infusion with Heparin drip.   Assessment & Plan:   Active Problems:   Bilateral pulmonary embolism (HCC)   Acute pulmonary embolism ([place])   COVID-19 virus infection   Pulmonary embolism (HCC)  Acute hypoxic respiratory failure secondary to submassive pulmonary embolism: Seen on CTA on 10/28, suspect secondary to COVID-19 infection. Also endorses increasingly being sedentary at home. Troponin elevated, likely due to demand ischemia.  For the same reason, BNP also elevated due to echo shows RV strain with global hypokinesis.  EKG twice shows no acute ST-T wave changes.  Patient still is requiring 4 to 5 L of oxygen although she denies any shortness of breath.  She is saturating about 85% on room air when I saw her.  We will try to wean her oxygen.  Due to her high oxygen demand, I will continue her on heparin for another day.   COVID-19: Has history of it, not an active infection  currently. Diagnosed 10/14, s/p paxlovid 5 day course, 3 days stereoids outpatient -Monitor respiratory status.  Not on isolation since post 10 days positive test. -Supportive care at this time  Acute on chronic diastolic congestive heart failure: Chest x-ray shows possible pulm edema, BNP also elevated, likely exacerbated due to PE.  Gave her a dose of Lasix IV 40 yesterday.  We will give her 2 more doses of IV 40 mg today.  Reassess tomorrow.  Hypertension: Much better controlled, continue Cozaar.   Hyperlipidemia: Continue statin.  CKD, stage IIIb: Creatinine at baseline which is in the range of 1.3-1.6.  DM, type II: Controlled.  Hemoglobin A1c 6.1.  Continue SSI.  Chronic arthritis of bilateral knee: Continue current pain medications.  DVT prophylaxis: SCDs Start: 05/01/21 0003   Code Status: DNR  Family Communication:  None present at bedside.  Plan of care discussed with patient in length and he verbalized understanding and agreed with it.  Status is: Inpatient  Remains inpatient appropriate because: Needs close monitoring and management of submassive PE  Estimated body mass index is 46.12 kg/m as calculated from the following:   Height as of this encounter: 5\' 3"  (1.6 m).   Weight as of this encounter: 118.1 kg.     Nutritional Assessment: Body mass index is 46.12 kg/m.Marland Kitchen Seen by dietician.  I agree with the assessment and plan as outlined below: Nutrition Status:  Skin Assessment: I have examined the patient's skin and I agree with the wound  assessment as performed by the wound care RN as outlined below:    Consultants:  None  Procedures:  None  Antimicrobials:  Anti-infectives (From admission, onward)    None          Subjective:  Patient seen and examined.  Her husband was at the bedside.  Patient once again was complaining of knee pain.  No shortness of breath.  PT was working with the patient.  Per PT, patient was barely able to stand up even  with the help.  They are recommending SNF but patient is now wanting to go however she is going to discuss with her husband.  PT would like to have another session with her tomorrow and further make final determination.  I have consulted TOC so they can start talking to the patient and provide more information about SNF in the community.  Objective: Vitals:   05/02/21 2255 05/02/21 2256 05/02/21 2322 05/03/21 0411  BP:   (!) 132/59 (!) 141/84  Pulse:   68 74  Resp:   16 15  Temp:   98.6 F (37 C) 97.9 F (36.6 C)  TempSrc:   Oral Oral  SpO2: (!) 87% 95% 94% 96%  Weight:    118.1 kg  Height:        Intake/Output Summary (Last 24 hours) at 05/03/2021 1031 Last data filed at 05/03/2021 0530 Gross per 24 hour  Intake 227.13 ml  Output 650 ml  Net -422.87 ml    Filed Weights   05/01/21 0500 05/02/21 0500 05/03/21 0411  Weight: 117.4 kg 118.1 kg 118.1 kg    Examination:  General exam: Appears calm and comfortable, morbidly obese Respiratory system: Some crackles at the bases bilaterally. Respiratory effort normal. Cardiovascular system: S1 & S2 heard, RRR. No JVD, murmurs, rubs, gallops or clicks. No pedal edema. Gastrointestinal system: Abdomen is nondistended, soft and nontender. No organomegaly or masses felt. Normal bowel sounds heard. Central nervous system: Alert and oriented. No focal neurological deficits. Extremities: Symmetric 5 x 5 power. Skin: No rashes, lesions or ulcers.  Psychiatry: Judgement and insight appear normal. Mood & affect appropriate.    Data Reviewed: I have personally reviewed following labs and imaging studies  CBC: Recent Labs  Lab 04/30/21 1423 05/01/21 0310 05/02/21 0203 05/03/21 0120  WBC 10.6* 13.1* 12.8* 12.9*  NEUTROABS 9.2*  --   --   --   HGB 15.4* 15.3* 14.4 14.5  HCT 46.9* 46.1* 44.0 44.6  MCV 94.0 92.4 93.2 92.9  PLT 153 155 150 143*    Basic Metabolic Panel: Recent Labs  Lab 04/30/21 1423 05/01/21 0310 05/03/21 0120   NA 142 140 137  K 4.3 3.9 3.7  CL 108 104 103  CO2 26 26 27   GLUCOSE 135* 107* 121*  BUN 29* 28* 33*  CREATININE 1.31* 1.39* 1.63*  CALCIUM 9.0 8.5* 8.4*  MG  --  1.8  --   PHOS  --  3.6  --     GFR: Estimated Creatinine Clearance: 31.9 mL/min (A) (by C-G formula based on SCr of 1.63 mg/dL (H)). Liver Function Tests: Recent Labs  Lab 04/30/21 1423  AST 16  ALT 23  ALKPHOS 76  BILITOT 0.7  PROT 7.3  ALBUMIN 3.7    No results for input(s): LIPASE, AMYLASE in the last 168 hours. No results for input(s): AMMONIA in the last 168 hours. Coagulation Profile: No results for input(s): INR, PROTIME in the last 168 hours. Cardiac Enzymes: No results for input(s):  CKTOTAL, CKMB, CKMBINDEX, TROPONINI in the last 168 hours. BNP (last 3 results) No results for input(s): PROBNP in the last 8760 hours. HbA1C: Recent Labs    05/01/21 0310  HGBA1C 6.1*    CBG: Recent Labs  Lab 05/02/21 1109 05/02/21 1508 05/02/21 2017 05/02/21 2324 05/03/21 0413  GLUCAP 136* 142* 177* 131* 106*    Lipid Profile: No results for input(s): CHOL, HDL, LDLCALC, TRIG, CHOLHDL, LDLDIRECT in the last 72 hours. Thyroid Function Tests: No results for input(s): TSH, T4TOTAL, FREET4, T3FREE, THYROIDAB in the last 72 hours. Anemia Panel: No results for input(s): VITAMINB12, FOLATE, FERRITIN, TIBC, IRON, RETICCTPCT in the last 72 hours. Sepsis Labs: No results for input(s): PROCALCITON, LATICACIDVEN in the last 168 hours.  Recent Results (from the past 240 hour(s))  Resp Panel by RT-PCR (Flu A&B, Covid) Nasopharyngeal Swab     Status: Abnormal   Collection Time: 04/30/21  4:50 PM   Specimen: Nasopharyngeal Swab; Nasopharyngeal(NP) swabs in vial transport medium  Result Value Ref Range Status   SARS Coronavirus 2 by RT PCR POSITIVE (A) NEGATIVE Final    Comment: RESULT CALLED TO, READ BACK BY AND VERIFIED WITH: Veneda Melter 1806 04/30/2021 DBRADLEY (NOTE) SARS-CoV-2 target nucleic acids are  DETECTED.  The SARS-CoV-2 RNA is generally detectable in upper respiratory specimens during the acute phase of infection. Positive results are indicative of the presence of the identified virus, but do not rule out bacterial infection or co-infection with other pathogens not detected by the test. Clinical correlation with patient history and other diagnostic information is necessary to determine patient infection status. The expected result is Negative.  Fact Sheet for Patients: EntrepreneurPulse.com.au  Fact Sheet for Healthcare Providers: IncredibleEmployment.be  This test is not yet approved or cleared by the [place] FDA and  has been authorized for detection and/or diagnosis of SARS-CoV-2 by FDA under an Emergency Use Authorization (EUA).  This EUA will remain in effect (meaning this test c an be used) for the duration of  the COVID-19 declaration under Section 564(b)(1) of the Act, 21 U.S.C. section 360bbb-3(b)(1), unless the authorization is terminated or revoked sooner.     Influenza A by PCR NEGATIVE NEGATIVE Final   Influenza B by PCR NEGATIVE NEGATIVE Final    Comment: (NOTE) The Xpert Xpress SARS-CoV-2/FLU/RSV plus assay is intended as an aid in the diagnosis of influenza from Nasopharyngeal swab specimens and should not be used as a sole basis for treatment. Nasal washings and aspirates are unacceptable for Xpert Xpress SARS-CoV-2/FLU/RSV testing.  Fact Sheet for Patients: EntrepreneurPulse.com.au  Fact Sheet for Healthcare Providers: IncredibleEmployment.be  This test is not yet approved or cleared by the [place] FDA and has been authorized for detection and/or diagnosis of SARS-CoV-2 by FDA under an Emergency Use Authorization (EUA). This EUA will remain in effect (meaning this test can be used) for the duration of the COVID-19 declaration under Section 564(b)(1) of the Act, 21  U.S.C. section 360bbb-3(b)(1), unless the authorization is terminated or revoked.  Performed at [facility], [address] [place], [place] [address]        Radiology Studies: ECHOCARDIOGRAM COMPLETE  Result Date: 05/01/2021    ECHOCARDIOGRAM REPORT   Patient Name:   MAKAYAH PAULI Date of Exam: 05/01/2021 Medical Rec #:  [id number]      Height:       63.0 in Accession #:    [id number]     Weight:       258.8 lb Date of  Birth:  06/21/1935       BSA:          2.158 m Patient Age:    84 years       BP:           155/92 mmHg Patient Gender: F              HR:           73 bpm. Exam Location:  Inpatient Procedure: 2D Echo, Color Doppler and Cardiac Doppler Indications:    Pulmonary embolism  History:        Patient has prior history of Echocardiogram examinations, most                 recent 10/10/2019. CT 04/30/21 which showed right heart strain;                 Risk Factors:Diabetes and Dyslipidemia. Covid 19 twice. CKD.  Sonographer:    Merrie Roof RDCS Referring Phys: [place]  1. Qualitatively, Right ventricular systolic function is mild to moderately reduced; TAPSE and tissue Doppler not performed. The right ventricular size is moderately enlarged. There is normal pulmonary artery systolic pressure. The estimated right ventricular systolic pressure is 53.9 mmHg.  2. Left ventricular ejection fraction, by estimation, is 55 to 60%. The left ventricle has normal function. The left ventricle demonstrates regional wall motion abnormalities (abnormal septal motion- there may be related to RV dysfunction). There is moderate concentric left ventricular hypertrophy. Left ventricular diastolic parameters are consistent with Grade I diastolic dysfunction (impaired relaxation). There is the interventricular septum is flattened in systole and diastole, consistent with right ventricular pressure and volume overload.  3. The mitral valve is normal in structure. No  evidence of mitral valve regurgitation. No evidence of mitral stenosis.  4. The aortic valve is tricuspid. Aortic valve regurgitation is not visualized. No aortic stenosis is present. Comparison(s): A prior study was performed on 10/10/2019. RV dysfunction is new. FINDINGS  Left Ventricle: Left ventricular ejection fraction, by estimation, is 55 to 60%. The left ventricle has normal function. The left ventricle demonstrates regional wall motion abnormalities. The left ventricular internal cavity size was normal in size. There is moderate concentric left ventricular hypertrophy. The interventricular septum is flattened in systole and diastole, consistent with right ventricular pressure and volume overload. Left ventricular diastolic parameters are consistent with Grade I  diastolic dysfunction (impaired relaxation). Right Ventricle: The right ventricular size is moderately enlarged. Right vetricular wall thickness was not well visualized. Right ventricular systolic function is moderately reduced. There is normal pulmonary artery systolic pressure. The tricuspid regurgitant velocity is 2.83 m/s, and with an assumed right atrial pressure of 3 mmHg, the estimated right ventricular systolic pressure is 76.7 mmHg. Left Atrium: Left atrial size was normal in size. Right Atrium: Right atrial size was normal in size. Pericardium: There is no evidence of pericardial effusion. Mitral Valve: The mitral valve is normal in structure. No evidence of mitral valve regurgitation. No evidence of mitral valve stenosis. Tricuspid Valve: The tricuspid valve is normal in structure. Tricuspid valve regurgitation is mild. Aortic Valve: The aortic valve is tricuspid. Aortic valve regurgitation is not visualized. No aortic stenosis is present. Aortic valve mean gradient measures 4.0 mmHg. Aortic valve peak gradient measures 7.2 mmHg. Aortic valve area, by VTI measures 1.86 cm. Pulmonic Valve: The pulmonic valve was normal in structure.  Pulmonic valve regurgitation is trivial. Aorta: The aortic root and ascending aorta are structurally  normal, with no evidence of dilitation. IAS/Shunts: The atrial septum is grossly normal.  LEFT VENTRICLE PLAX 2D LVIDd:         4.00 cm   Diastology LVIDs:         2.80 cm   LV e' medial:    5.82 cm/s LV PW:         1.40 cm   LV E/e' medial:  11.3 LV IVS:        1.20 cm   LV e' lateral:   7.42 cm/s LVOT diam:     1.90 cm   LV E/e' lateral: 8.8 LV SV:         51 LV SV Index:   24 LVOT Area:     2.84 cm  RIGHT VENTRICLE          IVC RV Basal diam:  3.60 cm  IVC diam: 2.00 cm LEFT ATRIUM             Index        RIGHT ATRIUM           Index LA diam:        4.50 cm 2.09 cm/m   RA Area:     19.80 cm LA Vol (A2C):   70.3 ml 32.58 ml/m  RA Volume:   58.60 ml  27.16 ml/m LA Vol (A4C):   40.4 ml 18.70 ml/m LA Biplane Vol: 66.9 ml 31.01 ml/m  AORTIC VALVE AV Area (Vmax):    1.95 cm AV Area (Vmean):   1.97 cm AV Area (VTI):     1.86 cm AV Vmax:           134.00 cm/s AV Vmean:          89.100 cm/s AV VTI:            0.273 m AV Peak Grad:      7.2 mmHg AV Mean Grad:      4.0 mmHg LVOT Vmax:         92.20 cm/s LVOT Vmean:        61.800 cm/s LVOT VTI:          0.179 m LVOT/AV VTI ratio: 0.66  AORTA Ao Root diam: 3.50 cm Ao Asc diam:  3.10 cm MITRAL VALVE                TRICUSPID VALVE MV Area (PHT): 3.93 cm     TR Peak grad:   32.0 mmHg MV Decel Time: 193 msec     TR Vmax:        283.00 cm/s MV E velocity: 65.50 cm/s MV A velocity: 119.00 cm/s  SHUNTS MV E/A ratio:  0.55         Systemic VTI:  0.18 m                             Systemic Diam: 1.90 cm Rudean Haskell MD Electronically signed by Rudean Haskell MD Signature Date/Time: 05/01/2021/1:05:03 PM    Final     Scheduled Meds:  carvedilol  12.5 mg Oral BID WC   dapagliflozin propanediol  10 mg Oral Daily    HYDROmorphone (DILAUDID) injection  0.5 mg Intravenous Once   insulin aspart  0-15 Units Subcutaneous Q4H   losartan  50 mg Oral Daily    pravastatin  20 mg Oral q1800   Continuous Infusions:  heparin 1,250 Units/hr (05/02/21 1700)     LOS: 3  days   Time spent: 30 minutes   Darliss Cheney, MD [facility]  05/03/2021, 10:31 AM  Please page via [place] and do not message via secure chat for anything urgent. Secure chat can be used for anything non urgent.  How to contact the [facility] Attending or Consulting provider [place] or covering provider during after hours [place], for this patient?  Check the care team in [facility] and look for a) attending/consulting TR[facility] provider listed and b) the [facility] team listed. Page or secure chat [phone]. Log into [email or web address] and use [place]'s universal password to access. If you do not have the password, please contact the hospital operator. Locate the [facility] provider you are looking for under [facility] and page to a number that you can be directly reached. If you still have difficulty reaching the provider, please page the [facility] (Director on Call) for the Hospitalists listed on amion for assistance.

## 2021-05-03 NOTE — Evaluation (Signed)
Occupational Therapy Evaluation Patient Details Name: Pamela [name] MRN: [id number] DOB: [birth year] Today's Date: 05/03/2021   History of Present Illness Pt is a 86 y.o. F who presents 04/30/2021 with acute hypoxic respiratory failure secondary to submassive pulmonary embolism in setting of recent COVID-19 infection. Significant PMH: diastolic HF, diabetes mellitus, HTN, CKD III.   Clinical Impression   Pt walked with a rollator and reports independence in self care prior to admission. She used a w/c for longer distance ambulation. She presents with generalized weakness, particularly in her LEs with B knee pain. She was able to clear her buttocks from elevated bed this visit, but unable to come to a full stand. Pt requires set up to total assist for ADL. She will need post acute rehab in SNF prior to returning home with her supportive husband.      Recommendations for follow up therapy are one component of a multi-disciplinary discharge planning process, led by the attending physician.  Recommendations may be updated based on patient status, additional functional criteria and insurance authorization.   Follow Up Recommendations  Skilled nursing-short term rehab (<3 hours/day)    Assistance Recommended at Discharge Frequent or constant Supervision/Assistance  Functional Status Assessment  Patient has had a recent decline in their functional status and/or demonstrates limited ability to make significant improvements in function in a reasonable and predictable amount of time  Equipment Recommendations  Other (comment) (defer to next venue)    Recommendations for Other Services       Precautions / Restrictions Precautions Precautions: Fall;Other (comment) Precaution Comments: watch O2 Restrictions Weight Bearing Restrictions: No      Mobility Bed Mobility Overal bed mobility: Needs Assistance Bed Mobility: Rolling;Supine to Sit;Sit to Supine Rolling: Supervision   Supine to sit:  Min assist Sit to supine: Mod assist   General bed mobility comments: assist for LEs, HOB up slightly, increased time and use of rails, rolled to straighten bed pad    Transfers Overall transfer level: Needs assistance Equipment used: Rolling walker (2 wheels) Transfers: Sit to/from Stand Sit to Stand: Mod assist           General transfer comment: pt attempting to stand, able to clear buttocks, but not come to a full stand with momentum from elevated EOB Transfer via Lift Equipment: Stedy    Balance Overall balance assessment: Needs assistance Sitting-balance support: Feet supported Sitting balance-Leahy Scale: Fair       Standing balance-Leahy Scale: Zero                             ADL either performed or assessed with clinical judgement   ADL Overall ADL's : Needs assistance/impaired Eating/Feeding: Independent;Bed level   Grooming: Brushing hair;Oral care;Sitting;Supervision/safety   Upper Body Bathing: Supervision/ safety;Sitting   Lower Body Bathing: Total assistance;Bed level;Sitting/lateral leans   Upper Body Dressing : Minimal assistance;Sitting   Lower Body Dressing: Total assistance;Bed level;Sitting/lateral leans                       Vision Baseline Vision/History: 1 Wears glasses Ability to See in Adequate Light: 0 Adequate Patient Visual Report: No change from baseline       Perception     Praxis      Pertinent Vitals/Pain Pain Assessment: Faces Faces Pain Scale: Hurts little more Pain Location: bilateral knees (L > R) Pain Descriptors / Indicators: Aching Pain Intervention(s): Monitored during session;Repositioned  Hand Dominance Right   Extremity/Trunk Assessment Upper Extremity Assessment Upper Extremity Assessment: Overall WFL for tasks assessed   Lower Extremity Assessment Lower Extremity Assessment: Defer to PT evaluation   Cervical / Trunk Assessment Cervical / Trunk Assessment: Other  exceptions Cervical / Trunk Exceptions: obesity   Communication Communication Communication: No difficulties   Cognition Arousal/Alertness: Awake/alert Behavior During Therapy: WFL for tasks assessed/performed Overall Cognitive Status: Within Functional Limits for tasks assessed                                       General Comments       Exercises    Shoulder Instructions      Home Living Family/patient expects to be discharged to:: Private residence Living Arrangements: Spouse/significant other Available Help at Discharge: Family Type of Home: House Home Access: Stairs to enter Technical brewer of Steps: 1   Home Layout: One level     Bathroom Shower/Tub: Occupational psychologist: Handicapped height     Home Equipment: Other (comment);Wheelchair - manual;Shower seat (3 wheeled walker)          Prior Functioning/Environment Prior Level of Function : Independent/Modified Independent             Mobility Comments: uses 3 wheeled walker for household ambulation, wheelchair for community mobility          OT Problem List: Decreased strength;Decreased activity tolerance;Impaired balance (sitting and/or standing);Decreased knowledge of use of DME or AE;Obesity;Pain      OT Treatment/Interventions: Self-care/ADL training;DME and/or AE instruction;Patient/family education;Balance training;Therapeutic activities    OT Goals(Current goals can be found in the care plan section) Acute Rehab OT Goals Patient Stated Goal: get stronger, go home OT Goal Formulation: With patient Time For Goal Achievement: 05/17/21 Potential to Achieve Goals: Good ADL Goals Pt Will Perform Grooming: with min assist;standing (one activity) Pt Will Perform Lower Body Bathing: with mod assist;with adaptive equipment;sit to/from stand Pt Will Perform Lower Body Dressing: with mod assist;sit to/from stand;with adaptive equipment Pt Will Transfer to Toilet:  with mod assist;stand pivot transfer;bedside commode Pt Will Perform Toileting - Clothing Manipulation and hygiene: with mod assist;sit to/from stand Additional ADL Goal #1: Pt will perform bed mobility with min guard assist in preparation for ADL.  OT Frequency: Min 2X/week   Barriers to D/C:            Co-evaluation              AM-PAC OT "6 Clicks" Daily Activity     Outcome Measure Help from another person eating meals?: None Help from another person taking care of personal grooming?: A Little Help from another person toileting, which includes using toliet, bedpan, or urinal?: Total Help from another person bathing (including washing, rinsing, drying)?: A Lot Help from another person to put on and taking off regular upper body clothing?: A Little Help from another person to put on and taking off regular lower body clothing?: Total 6 Click Score: 14   End of Session Equipment Utilized During Treatment: Rolling walker (2 wheels);Gait belt;Oxygen Nurse Communication: Other (comment) (IV alarming)  Activity Tolerance: Patient tolerated treatment well Patient left: in bed;with call bell/phone within reach;with bed alarm set;with family/visitor present  OT Visit Diagnosis: Unsteadiness on feet (R26.81);Other abnormalities of gait and mobility (R26.89);Pain;Muscle weakness (generalized) (M62.81)  Time: 0737-1062 OT Time Calculation (min): 18 min Charges:  OT General Charges $OT Visit: 1 Visit OT Evaluation $OT Eval Moderate Complexity: 1 Mod  Nestor Lewandowsky, OTR/L Acute Rehabilitation Services Pager: [phone] Office: [phone]   Malka So 05/03/2021, 12:47 PM

## 2021-05-04 DIAGNOSIS — I2699 Other pulmonary embolism without acute cor pulmonale: Secondary | ICD-10-CM | POA: Diagnosis not present

## 2021-05-04 DIAGNOSIS — I2609 Other pulmonary embolism with acute cor pulmonale: Secondary | ICD-10-CM | POA: Diagnosis not present

## 2021-05-04 LAB — CBC
HCT: 44.5 % (ref 36.0–46.0)
Hemoglobin: 14.4 g/dL (ref 12.0–15.0)
MCH: 30 pg (ref 26.0–34.0)
MCHC: 32.4 g/dL (ref 30.0–36.0)
MCV: 92.7 fL (ref 80.0–100.0)
Platelets: 143 10*3/uL — ABNORMAL LOW (ref 150–400)
RBC: 4.8 MIL/uL (ref 3.87–5.11)
RDW: 13.9 % (ref 11.5–15.5)
WBC: 12.8 10*3/uL — ABNORMAL HIGH (ref 4.0–10.5)
nRBC: 0 % (ref 0.0–0.2)

## 2021-05-04 LAB — GLUCOSE, CAPILLARY
Glucose-Capillary: 91 mg/dL (ref 70–99)
Glucose-Capillary: 152 mg/dL — ABNORMAL HIGH (ref 70–99)
Glucose-Capillary: 100 mg/dL — ABNORMAL HIGH (ref 70–99)
Glucose-Capillary: 115 mg/dL — ABNORMAL HIGH (ref 70–99)

## 2021-05-04 LAB — BASIC METABOLIC PANEL
Anion gap: 11 (ref 5–15)
BUN: 48 mg/dL — ABNORMAL HIGH (ref 8–23)
CO2: 29 mmol/L (ref 22–32)
Calcium: 8.6 mg/dL — ABNORMAL LOW (ref 8.9–10.3)
Chloride: 96 mmol/L — ABNORMAL LOW (ref 98–111)
Creatinine, Ser: 2.06 mg/dL — ABNORMAL HIGH (ref 0.44–1.00)
GFR, Estimated: 23 mL/min — ABNORMAL LOW (ref >60)
Glucose, Bld: 101 mg/dL — ABNORMAL HIGH (ref 70–99)
Potassium: 3.7 mmol/L (ref 3.5–5.1)
Sodium: 136 mmol/L (ref 135–145)

## 2021-05-04 LAB — HEPARIN LEVEL (UNFRACTIONATED): Heparin Unfractionated: 0.31 IU/mL (ref 0.30–0.70)

## 2021-05-04 MED ORDER — APIXABAN 5 MG PO TABS
5.0000 mg | ORAL_TABLET | Freq: Two times a day (BID) | ORAL | Status: DC
Start: 2021-05-11 — End: 2021-05-06

## 2021-05-04 MED ORDER — APIXABAN 5 MG PO TABS
10.0000 mg | ORAL_TABLET | Freq: Two times a day (BID) | ORAL | Status: DC
Start: 2021-05-04 — End: 2021-05-06
  Administered 2021-05-04 – 2021-05-06 (×5): 10 mg via ORAL
  Filled 2021-05-04 (×5): qty 2

## 2021-05-04 NOTE — TOC Initial Note (Signed)
Transition of Care [facility]) - Initial/Assessment Note    Patient Details  Name: Pamela [name] MRN: [id number] Date of Birth: Oct 18, 1934  Transition of Care [facility]) CM/SW Contact:    Vinie Sill, LCSW Phone Number: 05/04/2021, 2:23 PM  Clinical Narrative:                  CSW met with patient and her spouse. CSW introduced self and explained role. CSW discussed therapy recommendations. Patient states she is aware and understands recommendations but declines short term rehab at [facility]. Patient will discharge home with the support of her spouse. No preferred HH indicated. Patient states no DME needs. CSW advised RNCM will follow up.  TOC will continue to follow and assist with discharge planning.  Thurmond Butts, MSW, LCSW Clinical Social Worker    Expected Discharge Plan: Home w Home Health Services Barriers to Discharge: Continued Medical Work up   Patient Goals and CMS Choice        Expected Discharge Plan and Services Expected Discharge Plan: [place] In-house Referral: Clinical Social Work                                            Prior Living Arrangements/Services     Patient language and need for interpreter reviewed:: No        Need for Family Participation in Patient Care: Yes (Comment) Care giver support system in place?: Yes (comment)   Criminal Activity/Legal Involvement Pertinent to Current Situation/Hospitalization: No - Comment as needed  Activities of Daily Living Home Assistive Devices/Equipment: None ADL Screening (condition at time of admission) Patient's cognitive ability adequate to safely complete daily activities?: Yes Is the patient deaf or have difficulty hearing?: No Does the patient have difficulty seeing, even when wearing glasses/contacts?: No Does the patient have difficulty concentrating, remembering, or making decisions?: No Patient able to express need for assistance with ADLs?: Yes Does the patient  have difficulty dressing or bathing?: No Independently performs ADLs?: Yes (appropriate for developmental age) Does the patient have difficulty walking or climbing stairs?: No Weakness of Legs: None Weakness of Arms/Hands: None  Permission Sought/Granted Permission sought to share information with : Family Supports Permission granted to share information with : Yes, Verbal Permission Granted  Share Information with NAME: Phariss,Walter     Permission granted to share info w Relationship: spouse     Emotional Assessment Appearance:: Appears stated age Attitude/Demeanor/Rapport: Engaged Affect (typically observed): Pleasant, Appropriate Orientation: : Oriented to Self, Oriented to  Time, Oriented to Situation, Oriented to Place Alcohol / Substance Use: Not Applicable Psych Involvement: No (comment)  Admission diagnosis:  SOB (shortness of breath) [R06.02] Bilateral pulmonary embolism (HCC) [I26.99] Acute pulmonary embolism, unspecified pulmonary embolism type, unspecified whether acute cor pulmonale present ([place]) [I26.99] Pulmonary embolism ([place]) [I26.99] Patient Active Problem List   Diagnosis Date Noted   Pulmonary embolism ([place]) 05/01/2021   Bilateral pulmonary embolism ([place]) 04/30/2021   Acute pulmonary embolism ([place])    COVID-19 virus infection    Acute diastolic CHF (congestive heart failure) ([place]) 10/11/2019   Pulmonary edema 10/09/2019   Acute respiratory failure with hypoxia ([place]) 10/09/2019   Duodenal diverticulum, periampullary 11/03/2018   Diverticulosis of sigmoid colon 11/03/2018   Intestine with microperforation (Sigmoid vs Meckel's) 11/03/2018   Common bile duct stone 06/22/2017   HLD (hyperlipidemia) 06/22/2017   CKD (chronic  kidney disease) stage 3, GFR 30-59 ml/min (HCC) 06/22/2017   Chronic pain 06/22/2017   Morbid obesity with BMI of 50.0-59.9, adult ([place]) 06/22/2017   Seizures ([place]) 06/22/2017   Type 2 diabetes mellitus, without long-term current use of  insulin ([place]) 06/22/2017   RUQ pain    Bloating 11/04/2014   Hx of adenomatous colonic polyps 2016    Bilateral lower extremity edema 09/10/2013   Essential hypertension 09/10/2013   PCP:  Orpah Melter, MD Pharmacy:   [id number]: [phone]: [phone]    Social Determinants of Health (SDOH) Interventions    Readmission Risk Interventions No flowsheet data found.

## 2021-05-04 NOTE — Progress Notes (Signed)
ANTICOAGULATION CONSULT NOTE - Follow Up Consult  Pharmacy Consult for Heparin > Eliquis Indication: pulmonary embolus  Allergies  Allergen Reactions   Levaquin [Levofloxacin In D5w] Nausea And Vomiting    Patient Measurements: Height: 5\' 3"  (160 cm) Weight: 119.1 kg (262 lb 9.1 oz) IBW/kg (Calculated) : 52.4 Heparin Dosing Weight: 81 kg  Vital Signs: Temp: 97.9 F (36.6 C) (11/01 0742) Temp Source: Oral (11/01 0742) BP: 139/52 (11/01 0742) Pulse Rate: 73 (11/01 0742)  Labs: Recent Labs    05/01/21 0953 05/01/21 1159 05/02/21 0203 05/03/21 0120 05/04/21 0213  HGB  --    < > 14.4 14.5 14.4  HCT  --   --  44.0 44.6 44.5  PLT  --   --  150 143* 143*  HEPARINUNFRC  --    < > 0.36 0.32 0.31  CREATININE  --   --   --  1.63* 2.06*  TROPONINIHS 281*  --   --   --   --    < > = values in this interval not displayed.    Estimated Creatinine Clearance: 25.4 mL/min (A) (by C-G formula based on SCr of 2.06 mg/dL (H)).  Assessment: 86 yo F with bilateral PE with RHS per CTA On 04/30/21. No anticoagulation prior to admission. Pharmacy consulted for IV heparin dosing.      Heparin level is low therapeutic (0.31) on heparin drip at 1250 units/hr.   To transition to Eliquis today.   Goal of Therapy:  Heparin level 0.3-0.7 units/ml Appropriate Eliquis regimen for indication Monitor platelets by anticoagulation protocol: Yes   Plan:  Eliquis 10 mg BID for 7 days then 5 mg BID. Stop IV heparin when giving first dose of Eliquis. Intermittent CBC. Monitor for bleeding.  Arty Baumgartner, RPh 05/04/2021,8:51 AM

## 2021-05-04 NOTE — Care Management Important Message (Signed)
Important Message  Patient Details  Name: JASMANE BROCKWAY MRN: [id number] Date of Birth: 11-30-1934   Medicare Important Message Given:  Yes     Shelda Altes 05/04/2021, 11:21 AM

## 2021-05-04 NOTE — Progress Notes (Signed)
Physical Therapy Treatment Patient Details Name: Pamela [name] MRN: [id number] DOB: Sep 17, 1934 Today's Date: 05/04/2021   History of Present Illness Pt is an 86 y.o. female admitted 04/30/2021 with acute hypoxic respiratory failure secondary to submassive PE in setting of recent COVID-19 infection. PMH includes diastolic HF, DM, HTN, CKD III.   PT Comments    Pt progressing with mobility. Today's session focused on transfer and gait training with RW, pt requiring intermittent minA to stand and maintain balance with brief bouts of standing activity, could tolerate walking 14' before needing rest break. Pt remains limited by bilateral knee pain, generalized weakness, decreased activity tolerance, and impaired balance strategies/postural reactions. Pt's husband present and supportive; significant increased time discussing safe d/c home since pt declining SNF-level therapies. Will continue to follow acutely to address established goals.  SpO2 88-90% on RA >/94% on 2L O2 [place]    Recommendations for follow up therapy are one component of a multi-disciplinary discharge planning process, led by the attending physician.  Recommendations may be updated based on patient status, additional functional criteria and insurance authorization.  Follow Up Recommendations  Home health PT (pt declined SNF)     Assistance Recommended at Discharge Frequent or constant Supervision/Assistance  Equipment Recommendations  Bariatric 3in1 (PT)    Recommendations for Other Services       Precautions / Restrictions Precautions Precautions: Fall;Other (comment) Precaution Comments: Watch SpO2 Restrictions Weight Bearing Restrictions: No     Mobility  Bed Mobility Overal bed mobility: Modified Independent Bed Mobility: Supine to Sit     Supine to sit: Modified independent (Device/Increase time);HOB elevated     General bed mobility comments: Use of bed rail, increased time and effort     Transfers Overall transfer level: Needs assistance Equipment used: Rolling walker (2 wheels) Transfers: Sit to/from Stand Sit to Stand: Min assist;Min guard           General transfer comment: Initial minA for trunk elevation standing from EOB to RW, 2x additional sit<>stand from EOB and recliner with min guard for trunk elevation; repeated cues for hand placement as pt attempting to pull on RW    Ambulation/Gait Ambulation/Gait assistance: Min guard;+2 safety/equipment Gait Distance (Feet): 14 Feet Assistive device: Rolling walker (2 wheels) Gait Pattern/deviations: Step-through pattern;Decreased stride length;Trunk flexed Gait velocity: Decreased   General Gait Details: Initial bout of marching in place for ~15-sec then steps towards HOB with RW and min guard; seated rest, then ambulating 14' with RW and min guard, +2 for chair follow as pt limited by fatigue and bilateral knee pain; minA at times for RW management with verbal cues for RW management   Stairs             Wheelchair Mobility    Modified Rankin (Stroke Patients Only)       Balance Overall balance assessment: Needs assistance Sitting-balance support: Feet supported Sitting balance-Leahy Scale: Fair     Standing balance support: Bilateral upper extremity supported;During functional activity;Reliant on assistive device for balance;Single extremity supported Standing balance-Leahy Scale: Poor Standing balance comment: Reliant on at least single UE support to maintain static standing; heavy reliance on BUE support for dynamic stability                            Cognition Arousal/Alertness: Awake/alert Behavior During Therapy: WFL for tasks assessed/performed Overall Cognitive Status: Within Functional Limits for tasks assessed  Exercises      General Comments General comments (skin integrity, edema, etc.): Pt's husband  Franchot Mimes) present and supportive. Increased time discussing d/c recommendations since pt declining SNF - discussed DME needs, fall risk reduction, assist needs (especially related to ADL tasks), mobility strategies for home (i.e. having chairs available for rest breaks since pt only ambulating 14' today), etc.      Pertinent Vitals/Pain Pain Assessment: Faces Faces Pain Scale: Hurts little more Pain Location: bilateral knees (L > R) Pain Descriptors / Indicators: Aching Pain Intervention(s): Monitored during session;Premedicated before session    Home Living                          Prior Function            PT Goals (current goals can now be found in the care plan section) Progress towards PT goals: Progressing toward goals    Frequency    Min 3X/week      PT Plan Discharge plan needs to be updated    Co-evaluation              AM-PAC PT "6 Clicks" Mobility   Outcome Measure  Help needed turning from your back to your side while in a flat bed without using bedrails?: A Little Help needed moving from lying on your back to sitting on the side of a flat bed without using bedrails?: A Little Help needed moving to and from a bed to a chair (including a wheelchair)?: A Little Help needed standing up from a chair using your arms (e.g., wheelchair or bedside chair)?: A Little Help needed to walk in hospital room?: A Little Help needed climbing 3-5 steps with a railing? : A Lot 6 Click Score: 17    End of Session Equipment Utilized During Treatment: Gait belt;Oxygen Activity Tolerance: Patient tolerated treatment well Patient left: in chair;with call bell/phone within reach;with family/visitor present Nurse Communication: Mobility status PT Visit Diagnosis: Muscle weakness (generalized) (M62.81);Difficulty in walking, not elsewhere classified (R26.2)     Time: 0814-4818 PT Time Calculation (min) (ACUTE ONLY): 34 min  Charges:  $Therapeutic Activity: 8-22  mins $Self Care/Home Management: 8-22                     Mabeline Caras, PT, DPT Acute Rehabilitation Services  Pager [phone] Office [phone]  Derry Lory 05/04/2021, 5:12 PM

## 2021-05-04 NOTE — Progress Notes (Signed)
PT Cancellation Note  Patient Details Name: Pamela [name] MRN: [id number] DOB: 05-Apr-1935   Cancelled Treatment:    Reason Eval/Treat Not Completed: Fatigue/lethargy limiting ability to participate after reportedly sitting up all morning, pt currently returning to supine with nursing. Will follow-up for PT treatment as schedule permits.  Mabeline Caras, PT, DPT Acute Rehabilitation Services  Pager [phone] Office [place] 05/04/2021, 11:08 AM

## 2021-05-04 NOTE — Progress Notes (Signed)
PROGRESS NOTE    Pamela [name]  XAJ:[id number] DOB: February 03, 1935 DOA: 04/30/2021 PCP: Orpah Melter, MD   Brief Narrative:  86 yo female with known diastolic heart failure, diabetes mellitus, hypertension, CKD III admitted with pulmonary embolism with recent COVID diagnosis.   Patient was seen in PCP office on 10/14 for dyspnea with exertion, she was found to be + for COVID and started on Paxlovid. She was seen in follow up with her PCP and prescribed albuterol, guaifenesin, and prednisone for worsening symptoms.  Despite medical therapy, patient has continued to have increased dyspnea and SpO2 sats between 88% and 93% on room air.  She presented to urgent care for evaluation. VS on arrival:  SpO2 90% on 3 L [place], temp 98, HR: 115, BP 188/118. CTA Chest was completed and showed pulmonary embolism in the left and right pulmonary artery with segmental and subsegmental embolism.  RV:LV ratio reported as elevated. BNP 418, troponin HS 253>307.  She received Lasix 40 mg x 1, Hydralazine 26 mg PO x 1 and started on Heparin infusion.  She was transferred under [place] on 05/02/2021.  Assessment & Plan:   Active Problems:   Bilateral pulmonary embolism (HCC)   Acute pulmonary embolism ([place])   COVID-19 virus infection   Pulmonary embolism (HCC)  Acute hypoxic respiratory failure secondary to submassive pulmonary embolism and acute on chronic diastolic congestive heart failure: Seen on CTA on 10/28, suspect secondary to COVID-19 infection. Also endorses increasingly being sedentary at home. Troponin elevated, likely due to demand ischemia.  For the same reason, BNP also elevated.  echo shows RV strain with global hypokinesis.  EKG twice shows no acute ST-T wave changes.  Patient has improved after started on diuresis.  Now on 1 to 2 L of oxygen.  No shortness of breath.  Continue to wean to room air.   COVID-19: Has history of it, not an active infection currently. Diagnosed 10/14, s/p paxlovid 5 day  course, 3 days stereoids outpatient -Monitor respiratory status.  Not on isolation since post 10 days positive test. -Supportive care at this time  Acute on chronic diastolic congestive heart failure: Chest x-ray shows possible pulm edema, BNP also elevated, likely exacerbated due to PE.  Patient had significant diuresis after Lasix, she received total 3 doses of IV Lasix 40 mg every 12 hours, due to her rising creatinine, no further Lasix at this point in time.  Hypertension: Much better controlled, she was not taking any Cozaar at home, blood pressure was elevated so Cozaar was resumed here.  Now holding Cozaar due to rising creatinine, monitor.  Hyperlipidemia: Continue statin.  AKI on CKD, stage IIIb: Her baseline creatinine range of 1.3-1.6.  Slightly worse creatinine today, likely due to diuresis.  No further diuresis, hold Cozaar and Farxiga as well.  Repeat labs in the morning.  DM, type II: Hemoglobin A1c 6.1.  Blood sugar controlled. Continue SSI.  Chronic arthritis of bilateral knee: Continue current pain medications.  She is requesting to prescribe some oxycodone at the time of discharge.  DVT prophylaxis: SCDs Start: 05/01/21 0003   Code Status: DNR  Family Communication:  husband at bedside.  He had multiple questions yet again today, some of them were repeat questions that I answered yesterday and again today.  Status is: Inpatient  Remains inpatient appropriate because: Needs close monitoring and management of submassive PE  Estimated body mass index is 46.51 kg/m as calculated from the following:   Height as of this encounter: 5'  3" (1.6 m).   Weight as of this encounter: 119.1 kg.     Nutritional Assessment: Body mass index is 46.51 kg/m.Marland Kitchen Seen by dietician.  I agree with the assessment and plan as outlined below: Nutrition Status:  Skin Assessment: I have examined the patient's skin and I agree with the wound assessment as performed by the wound care RN as  outlined below:    Consultants:  None  Procedures:  None  Antimicrobials:  Anti-infectives (From admission, onward)    None          Subjective:  Patient seen and examined.  Husband at the bedside.  Patient states that she is feeling a lot better.  No shortness of breath.  Still with knee pain but improving with oxycodone.  She is down to 1 to 2 L of oxygen.  She is telling me that she did very well with the OT yesterday and she is hoping to avoid going to SNF.  Objective: Vitals:   05/04/21 0404 05/04/21 0645 05/04/21 0742 05/04/21 1113  BP: 140/66  (!) 139/52 123/62  Pulse: 60  73 64  Resp: 17  18 17   Temp: 97.8 F (36.6 C)  97.9 F (36.6 C) 97.6 F (36.4 C)  TempSrc: Oral  Oral Oral  SpO2: 94%  90% 91%  Weight:  119.1 kg    Height:        Intake/Output Summary (Last 24 hours) at 05/04/2021 1124 Last data filed at 05/04/2021 0454 Gross per 24 hour  Intake --  Output 2300 ml  Net -2300 ml    Filed Weights   05/02/21 0500 05/03/21 0411 05/04/21 0645  Weight: 118.1 kg 118.1 kg 119.1 kg    Examination:  General exam: Appears calm and comfortable, morbidly obese Respiratory system: Bibasilar crackles, improved compared to yesterday.  Respiratory effort normal. Cardiovascular system: S1 & S2 heard, RRR. No JVD, murmurs, rubs, gallops or clicks. No pedal edema. Gastrointestinal system: Abdomen is nondistended, soft and nontender. No organomegaly or masses felt. Normal bowel sounds heard. Central nervous system: Alert and oriented. No focal neurological deficits. Extremities: Symmetric 5 x 5 power. Skin: No rashes, lesions or ulcers.  Psychiatry: Judgement and insight appear normal. Mood & affect appropriate.    Data Reviewed: I have personally reviewed following labs and imaging studies  CBC: Recent Labs  Lab 04/30/21 1423 05/01/21 0310 05/02/21 0203 05/03/21 0120 05/04/21 0213  WBC 10.6* 13.1* 12.8* 12.9* 12.8*  NEUTROABS 9.2*  --   --   --   --    HGB 15.4* 15.3* 14.4 14.5 14.4  HCT 46.9* 46.1* 44.0 44.6 44.5  MCV 94.0 92.4 93.2 92.9 92.7  PLT 153 155 150 143* 143*    Basic Metabolic Panel: Recent Labs  Lab 04/30/21 1423 05/01/21 0310 05/03/21 0120 05/04/21 0213  NA 142 140 137 136  K 4.3 3.9 3.7 3.7  CL 108 104 103 96*  CO2 26 26 27 29   GLUCOSE 135* 107* 121* 101*  BUN 29* 28* 33* 48*  CREATININE 1.31* 1.39* 1.63* 2.06*  CALCIUM 9.0 8.5* 8.4* 8.6*  MG  --  1.8  --   --   PHOS  --  3.6  --   --     GFR: Estimated Creatinine Clearance: 25.4 mL/min (A) (by C-G formula based on SCr of 2.06 mg/dL (H)). Liver Function Tests: Recent Labs  Lab 04/30/21 1423  AST 16  ALT 23  ALKPHOS 76  BILITOT 0.7  PROT 7.3  ALBUMIN  3.7    No results for input(s): LIPASE, AMYLASE in the last 168 hours. No results for input(s): AMMONIA in the last 168 hours. Coagulation Profile: No results for input(s): INR, PROTIME in the last 168 hours. Cardiac Enzymes: No results for input(s): CKTOTAL, CKMB, CKMBINDEX, TROPONINI in the last 168 hours. BNP (last 3 results) No results for input(s): PROBNP in the last 8760 hours. HbA1C: No results for input(s): HGBA1C in the last 72 hours.  CBG: Recent Labs  Lab 05/03/21 0413 05/03/21 1250 05/03/21 1642 05/03/21 2110 05/04/21 0643  GLUCAP 106* 131* 122* 111* 91    Lipid Profile: No results for input(s): CHOL, HDL, LDLCALC, TRIG, CHOLHDL, LDLDIRECT in the last 72 hours. Thyroid Function Tests: No results for input(s): TSH, T4TOTAL, FREET4, T3FREE, THYROIDAB in the last 72 hours. Anemia Panel: No results for input(s): VITAMINB12, FOLATE, FERRITIN, TIBC, IRON, RETICCTPCT in the last 72 hours. Sepsis Labs: No results for input(s): PROCALCITON, LATICACIDVEN in the last 168 hours.  Recent Results (from the past 240 hour(s))  Resp Panel by RT-PCR (Flu A&B, Covid) Nasopharyngeal Swab     Status: Abnormal   Collection Time: 04/30/21  4:50 PM   Specimen: Nasopharyngeal Swab;  Nasopharyngeal(NP) swabs in vial transport medium  Result Value Ref Range Status   SARS Coronavirus 2 by RT PCR POSITIVE (A) NEGATIVE Final    Comment: RESULT CALLED TO, READ BACK BY AND VERIFIED WITH: Veneda Melter 1806 04/30/2021 DBRAD[place] (NOTE) SARS-CoV-2 target nucleic acids are DETECTED.  The SARS-CoV-2 RNA is generally detectable in upper respiratory specimens during the acute phase of infection. Positive results are indicative of the presence of the identified virus, but do not rule out bacterial infection or co-infection with other pathogens not detected by the test. Clinical correlation with patient history and other diagnostic information is necessary to determine patient infection status. The expected result is Negative.  Fact Sheet for Patients: EntrepreneurPulse.com.au  Fact Sheet for Healthcare Providers: IncredibleEmployment.be  This test is not yet approved or cleared by the [place] FDA and  has been authorized for detection and/or diagnosis of SARS-CoV-2 by FDA under an Emergency Use Authorization (EUA).  This EUA will remain in effect (meaning this test c an be used) for the duration of  the COVID-19 declaration under Section 564(b)(1) of the Act, 21 U.S.C. section 360bbb-3(b)(1), unless the authorization is terminated or revoked sooner.     Influenza A by PCR NEGATIVE NEGATIVE Final   Influenza B by PCR NEGATIVE NEGATIVE Final    Comment: (NOTE) The Xpert Xpress SARS-CoV-2/FLU/RSV plus assay is intended as an aid in the diagnosis of influenza from Nasopharyngeal swab specimens and should not be used as a sole basis for treatment. Nasal washings and aspirates are unacceptable for Xpert Xpress SARS-CoV-2/FLU/RSV testing.  Fact Sheet for Patients: EntrepreneurPulse.com.au  Fact Sheet for Healthcare Providers: IncredibleEmployment.be  This test is not yet approved or cleared by the  [place] FDA and has been authorized for detection and/or diagnosis of SARS-CoV-2 by FDA under an Emergency Use Authorization (EUA). This EUA will remain in effect (meaning this test can be used) for the duration of the COVID-19 declaration under Section 564(b)(1) of the Act, 21 U.S.C. section 360bbb-3(b)(1), unless the authorization is terminated or revoked.  Performed at [facility], [address] [place], [place] [address]      Radiology Studies: No results found.  Scheduled Meds:  apixaban  10 mg Oral BID   Followed by   Derrill Memo ON 05/11/2021] apixaban  5 mg Oral BID   carvedilol  12.5 mg Oral BID WC   insulin aspart  0-15 Units Subcutaneous Q4H   pravastatin  20 mg Oral q1800   Continuous Infusions:   LOS: 4 days   Time spent: 28 minutes  Darliss Cheney, MD [facility]  05/04/2021, 11:25 AM  Please page via [place] [place] and do not message via secure chat for anything urgent. Secure chat can be used for anything non urgent.  How to contact the [facility] Attending or Consulting provider [place] or covering provider during after hours [place], for this patient?  Check the care team in [facility] and look for a) attending/consulting TRH provider listed and b) the [facility] team listed. Page or secure chat [phone]. Log into [email or web address] and use [place]'s universal password to access. If you do not have the password, please contact the hospital operator. Locate the [facility] provider you are looking for under [facility] and page to a number that you can be directly reached. If you still have difficulty reaching the provider, please page the [facility] (Director on Call) for the Hospitalists listed on amion for assistance.

## 2021-05-05 DIAGNOSIS — I10 Essential (primary) hypertension: Secondary | ICD-10-CM | POA: Diagnosis not present

## 2021-05-05 DIAGNOSIS — I2609 Other pulmonary embolism with acute cor pulmonale: Secondary | ICD-10-CM | POA: Diagnosis not present

## 2021-05-05 DIAGNOSIS — U071 COVID-19: Secondary | ICD-10-CM | POA: Diagnosis not present

## 2021-05-05 DIAGNOSIS — J9601 Acute respiratory failure with hypoxia: Secondary | ICD-10-CM | POA: Diagnosis not present

## 2021-05-05 LAB — GLUCOSE, CAPILLARY
Glucose-Capillary: 110 mg/dL — ABNORMAL HIGH (ref 70–99)
Glucose-Capillary: 114 mg/dL — ABNORMAL HIGH (ref 70–99)
Glucose-Capillary: 170 mg/dL — ABNORMAL HIGH (ref 70–99)
Glucose-Capillary: 93 mg/dL (ref 70–99)
Glucose-Capillary: 96 mg/dL (ref 70–99)
Glucose-Capillary: 97 mg/dL (ref 70–99)

## 2021-05-05 LAB — BASIC METABOLIC PANEL
Anion gap: 10 (ref 5–15)
BUN: 59 mg/dL — ABNORMAL HIGH (ref 8–23)
CO2: 27 mmol/L (ref 22–32)
Calcium: 8.4 mg/dL — ABNORMAL LOW (ref 8.9–10.3)
Chloride: 99 mmol/L (ref 98–111)
Creatinine, Ser: 1.97 mg/dL — ABNORMAL HIGH (ref 0.44–1.00)
GFR, Estimated: 25 mL/min — ABNORMAL LOW (ref >60)
Glucose, Bld: 90 mg/dL (ref 70–99)
Potassium: 3.7 mmol/L (ref 3.5–5.1)
Sodium: 136 mmol/L (ref 135–145)

## 2021-05-05 MED ORDER — APIXABAN 5 MG PO TABS: ORAL_TABLET | ORAL | 0 refills | Status: DC

## 2021-05-05 MED ORDER — OXYCODONE HCL 5 MG PO TABS: 5.0000 mg | ORAL_TABLET | Freq: Four times a day (QID) | ORAL | 0 refills | Status: AC | PRN

## 2021-05-05 NOTE — Progress Notes (Signed)
Occupational Therapy Treatment Patient Details Name: Pamela [name] MRN: [id number] DOB: 22-Oct-1934 Today's Date: 05/05/2021   History of present illness Pt is an 86 y.o. female admitted 04/30/2021 with acute hypoxic respiratory failure secondary to submassive PE in setting of recent COVID-19 infection. PMH includes diastolic HF, DM, HTN, CKD III.   OT comments  Pt recently returned to room after ambulating hall with mobility tech. Pt demonstrating ability to manage toileting on BSC with min guard assist, perform one grooming activity in standing at sink and don and doff her socks. Verbally educated in energy conservation strategies and to avoid breath holding with exertion. Updated d/c recommendation to [facility].    Recommendations for follow up therapy are one component of a multi-disciplinary discharge planning process, led by the attending physician.  Recommendations may be updated based on patient status, additional functional criteria and insurance authorization.    Follow Up Recommendations  Home health OT    Assistance Recommended at Discharge Intermittent Supervision/Assistance  Equipment Recommendations  None recommended by OT    Recommendations for Other Services      Precautions / Restrictions Precautions Precautions: Fall Precaution Comments: Watch SpO2       Mobility Bed Mobility               General bed mobility comments: in chair    Transfers Overall transfer level: Needs assistance Equipment used: Rolling walker (2 wheels) Transfers: Sit to/from Stand Sit to Stand: Min guard                 Balance Overall balance assessment: Needs assistance   Sitting balance-Leahy Scale: Good Sitting balance - Comments: no LOB donning socks   Standing balance support: No upper extremity supported Standing balance-Leahy Scale: Fair                             ADL either performed or assessed with clinical judgement   ADL Overall ADL's : Needs  assistance/impaired     Grooming: Min guard;Standing;Wash/dry hands               Lower Body Dressing: Set up;Sitting/lateral leans Lower Body Dressing Details (indicate cue type and reason): donned socks from recliner, increased effort, but Sp02 remained at 95% Toilet Transfer: Min guard;Stand-pivot;BSC;Rollator (4 wheels)   Toileting- Water quality scientist and Hygiene: Min guard;Sit to/from stand         General ADL Comments: Educated in energy conservation strategies, verbally.     Vision       Perception     Praxis      Cognition Arousal/Alertness: Awake/alert Behavior During Therapy: WFL for tasks assessed/performed Overall Cognitive Status: Within Functional Limits for tasks assessed                                            Exercises     Shoulder Instructions       General Comments      Pertinent Vitals/ Pain       Pain Assessment: Faces Faces Pain Scale: Hurts little more Pain Location: R foot with weightbearing Pain Descriptors / Indicators: Discomfort;Sore Pain Intervention(s): Monitored during session;Repositioned  Home Living  Prior Functioning/Environment              Frequency  Min 2X/week        Progress Toward Goals  OT Goals(current goals can now be found in the care plan section)  Progress towards OT goals: Progressing toward goals  Acute Rehab OT Goals Patient Stated Goal: return home without 02 OT Goal Formulation: With patient Time For Goal Achievement: 05/17/21 Potential to Achieve Goals: Good  Plan Discharge plan needs to be updated    Co-evaluation                 AM-PAC OT "6 Clicks" Daily Activity     Outcome Measure   Help from another person eating meals?: None Help from another person taking care of personal grooming?: A Little Help from another person toileting, which includes using toliet, bedpan, or urinal?: A  Little       6 Click Score: 10    End of Session Equipment Utilized During Treatment: Oxygen;Gait belt (1L, 3 [employer])  OT Visit Diagnosis: Unsteadiness on feet (R26.81);Other abnormalities of gait and mobility (R26.89);Pain;Muscle weakness (generalized) (M62.81)   Activity Tolerance Patient tolerated treatment well   Patient Left in chair;with call bell/phone within reach;with family/visitor present   Nurse Communication          Time: 1751-0258 OT Time Calculation (min): 15 min  Charges: OT General Charges $OT Visit: 1 Visit OT Treatments $Self Care/Home Management : 8-22 mins  Nestor Lewandowsky, OTR/L Acute Rehabilitation Services Pager: [phone] Office: [phone]   Malka So 05/05/2021, 3:41 PM

## 2021-05-05 NOTE — Discharge Instructions (Signed)
Information on my medicine - ELIQUIS (apixaban)  This medication education was reviewed with me or my healthcare representative as part of my discharge preparation.    Why was Eliquis prescribed for you? Eliquis was prescribed to treat blood clots that may have been found in the veins of your legs (deep vein thrombosis) or in your lungs (pulmonary embolism) and to reduce the risk of them occurring again.  What do You need to know about Eliquis ? The starting dose is 10 mg (two 5 mg tablets) taken TWICE daily for the FIRST SEVEN (7) DAYS, then on 05/11/21  the dose is reduced to ONE 5 mg tablet taken TWICE daily.  Eliquis may be taken with or without food.   Try to take the dose about the same time in the morning and in the evening. If you have difficulty swallowing the tablet whole please discuss with your pharmacist how to take the medication safely.  Take Eliquis exactly as prescribed and DO NOT stop taking Eliquis without talking to the doctor who prescribed the medication.  Stopping may increase your risk of developing a new blood clot.  Refill your prescription before you run out.  After discharge, you should have regular check-up appointments with your healthcare provider that is prescribing your Eliquis.    What do you do if you miss a dose? If a dose of ELIQUIS is not taken at the scheduled time, take it as soon as possible on the same day and twice-daily administration should be resumed. The dose should not be doubled to make up for a missed dose.  Important Safety Information A possible side effect of Eliquis is bleeding. You should call your healthcare provider right away if you experience any of the following: Bleeding from an injury or your nose that does not stop. Unusual colored urine (red or dark brown) or unusual colored stools (red or black). Unusual bruising for unknown reasons. A serious fall or if you hit your head (even if there is no bleeding).  Some  medicines may interact with Eliquis and might increase your risk of bleeding or clotting while on Eliquis. To help avoid this, consult your healthcare provider or pharmacist prior to using any new prescription or non-prescription medications, including herbals, vitamins, non-steroidal anti-inflammatory drugs (NSAIDs) and supplements.  This website has more information on Eliquis (apixaban): http://[email or web address].com/eliquis/home

## 2021-05-05 NOTE — Progress Notes (Signed)
Physical Therapy Treatment Patient Details Name: Pamela [name] MRN: [id number] DOB: 19-Mar-1935 Today's Date: 05/05/2021   History of Present Illness Pt is an 86 y.o. female admitted 04/30/2021 with acute hypoxic respiratory failure secondary to submassive PE in setting of recent COVID-19 infection. PMH includes diastolic HF, DM, HTN, CKD III.   PT Comments    Pt progressing with mobility. Today's session focused on caregiver training with bed mobility and transfers; pt performing brief bouts of standing activity with her 3-wheeled walker and min guard assist provided by husband. Educ re: assist needs, fall risk reduction, safety with transfers (gait belt provided), DME needs, activity recommendations. Hopeful for additional session to provide stair training prior to d/c as pt limited by R foot pain this morning. Will continue to follow acutely to address established goals.  HR up to 116 with activity SpO2 93% on 1L O2 [place]    Recommendations for follow up therapy are one component of a multi-disciplinary discharge planning process, led by the attending physician.  Recommendations may be updated based on patient status, additional functional criteria and insurance authorization.  Follow Up Recommendations  Home health PT (declined SNF)     Assistance Recommended at Discharge Frequent or constant Supervision/Assistance  Equipment Recommendations  Bariatric 3in1    Recommendations for Other Services       Precautions / Restrictions Precautions Precautions: Fall;Other (comment) Precaution Comments: Watch SpO2 Restrictions Weight Bearing Restrictions: No     Mobility  Bed Mobility Overal bed mobility: Needs Assistance Bed Mobility: Supine to Sit     Supine to sit: Min assist;HOB elevated     General bed mobility comments: Husband providing minA for RLE management    Transfers Overall transfer level: Needs assistance Equipment used:  (pt's 3-wheeled walker) Transfers: Sit  to/from Stand Sit to Stand: Min guard           General transfer comment: educ husband on guarding technique and recommend he hold 3-wheeled walker still since pt has difficulty holding brakes down; pt requiring increased time to stand, min guard for balance provided by husband; pt adamant about using her walker as opposed to more stable 2-wheel walker option    Ambulation/Gait             General Gait Details:  (pivotal steps from bed to recliner (~2 ft) with use of 3-wheeled walker, min guard provided by husband with gait belt; pt endorses limited by R foot pain)   Stairs             Wheelchair Mobility    Modified Rankin (Stroke Patients Only)       Balance Overall balance assessment: Needs assistance Sitting-balance support: Feet supported Sitting balance-Leahy Scale: Fair     Standing balance support: Bilateral upper extremity supported;During functional activity;Reliant on assistive device for balance Standing balance-Leahy Scale: Poor Standing balance comment: Reliant on BUE support                            Cognition Arousal/Alertness: Awake/alert Behavior During Therapy: WFL for tasks assessed/performed Overall Cognitive Status: Within Functional Limits for tasks assessed                                          Exercises      General Comments General comments (skin integrity, edema, etc.): Session focused on having pt's  husband Franchot Mimes) practice guarding and providing necessary assist for pt mobility, including bed mobility and transfers this session - gait belt provided and instructed on use. Again, discussed recommendation for more stable 2-wheel RW use, but pt with preference to use her 3-wheeled walker since "it's what I'm used to" - of note, husband needing to hold pt's walker at times for added stability. Educ on fall risk reduction with household mobility; discussed need for potential BSC/3in1 use in living room if  pt with urgency, ambulation limited by painful LEs, or pt home alone needing to use bathroom; pt an dhusband in agreement. HR up to 116 with activity, SpO2 93% on 1L O2      Pertinent Vitals/Pain Pain Assessment: Faces Faces Pain Scale: Hurts little more Pain Location: R foot Pain Descriptors / Indicators: Discomfort;Sore Pain Intervention(s): Monitored during session;Limited activity within patient's tolerance    Home Living                          Prior Function            PT Goals (current goals can now be found in the care plan section) Progress towards PT goals: Progressing toward goals    Frequency    Min 3X/week      PT Plan Current plan remains appropriate    Co-evaluation              AM-PAC PT "6 Clicks" Mobility   Outcome Measure  Help needed turning from your back to your side while in a flat bed without using bedrails?: A Little Help needed moving from lying on your back to sitting on the side of a flat bed without using bedrails?: A Little Help needed moving to and from a bed to a chair (including a wheelchair)?: A Little Help needed standing up from a chair using your arms (e.g., wheelchair or bedside chair)?: A Little Help needed to walk in hospital room?: A Little Help needed climbing 3-5 steps with a railing? : A Lot 6 Click Score: 17    End of Session Equipment Utilized During Treatment: Gait belt;Oxygen Activity Tolerance: Patient tolerated treatment well Patient left: in chair;with call bell/phone within reach;with family/visitor present Nurse Communication: Mobility status PT Visit Diagnosis: Muscle weakness (generalized) (M62.81);Difficulty in walking, not elsewhere classified (R26.2)     Time: 9458-5929 PT Time Calculation (min) (ACUTE ONLY): 16 min  Charges:  $Therapeutic Activity: 8-22 mins                     Mabeline Caras, PT, DPT Acute Rehabilitation Services  Pager [phone] Office [place] 05/05/2021, 9:54 AM

## 2021-05-05 NOTE — Progress Notes (Signed)
Mobility Specialist: Progress Note   05/05/21 1449  Mobility  Activity Ambulated in hall  Level of Assistance Standby assist, set-up cues, supervision of patient - no hands on  Assistive Device  (3 Wheel Walker)  Distance Ambulated (ft) 50 ft  Mobility Ambulated with assistance in hallway  Mobility Response Tolerated well  Mobility performed by Mobility specialist  Bed Position Chair  $Mobility charge 1 Mobility   Pre-Mobility on 1 L/min: 93 HR, 98% SpO2 During Mobility on RA: 86% SpO2 Post-Mobility: 62 HR, 97% SpO2  Pt to BSC and then agreeable to ambulation, void successful. Pt standby assist for transfer as well as during ambulation. Pt began ambulation on 1 L/min [place]. Attempted to wean to RA but pt desat to 86% during ambulation. Pt back to recliner after walk on 1 L/min [place] with call bell and phone at her side.   [facility] Donnie Panik Mobility Specialist Mobility Specialist Phone: [phone]

## 2021-05-05 NOTE — Progress Notes (Signed)
Pt seen on evening rounds. Husband walter at bedside. Pt able to walk from bed to bathroom and then out into hallway. Still requiring 1 L/min [place] oxygen. Pt feels better. Tired from all the walking. Husband confirmed that Rx received at outpatient pharmacy.

## 2021-05-05 NOTE — Progress Notes (Signed)
PROGRESS NOTE    Pamela [name]  IFO:[id number] DOB: [birth year] DOA: 04/30/2021 PCP: Orpah Melter, MD   Brief Narrative:  86 yo female with known diastolic heart failure, diabetes mellitus, hypertension, CKD III admitted with pulmonary embolism with recent COVID diagnosis.   Patient was seen in PCP office on 10/14 for dyspnea with exertion, she was found to be + for COVID and started on Paxlovid. She was seen in follow up with her PCP and prescribed albuterol, guaifenesin, and prednisone for worsening symptoms.  Despite medical therapy, patient has continued to have increased dyspnea and SpO2 sats between 88% and 93% on room air.  She presented to urgent care for evaluation. VS on arrival:  SpO2 90% on 3 L [place], temp 98, HR: 115, BP 188/118. CTA Chest was completed and showed pulmonary embolism in the left and right pulmonary artery with segmental and subsegmental embolism.  RV:LV ratio reported as elevated. BNP 418, troponin HS 253>307.  She received Lasix 40 mg x 1, Hydralazine 26 mg PO x 1 and started on Heparin infusion.  She was transferred under [place] on 05/02/2021.  Assessment & Plan:   Active Problems:   Bilateral pulmonary embolism (HCC)   Acute pulmonary embolism ([place])   COVID-19 virus infection   Pulmonary embolism (HCC)  Acute hypoxic respiratory failure secondary to submassive pulmonary embolism and acute on chronic diastolic congestive heart failure: Seen on CTA on 10/28, suspect secondary to COVID-19 infection. Also endorses increasingly being sedentary at home. Troponin elevated, likely due to demand ischemia.  For the same reason, BNP also elevated.  echo shows RV strain with global hypokinesis.  EKG twice shows no acute ST-T wave changes.  Patient has improved after started on diuresis.  Now on 1 to 2 L of oxygen.  No shortness of breath.  Continue to wean to room air.   Discussed with pt and husband that pt needs to remain off supplemental O2 for 12-24 hours prior to discharge  to home. Pt would like to be discharged to home and avoid SNF placement.  I have sent Rx for Eliquis and oxycodone to [facility] on [address] on 05-05-2021 so that husband could pick up these Rx today.  COVID-19: Has history of it, not an active infection currently. Diagnosed 10/14, s/p paxlovid 5 day course, 3 days stereoids outpatient -Monitor respiratory status.  Not on isolation since post 10 days positive test. -Supportive care at this time  Acute on chronic diastolic congestive heart failure: Chest x-ray shows possible pulm edema, BNP also elevated, likely exacerbated due to PE.  Patient had significant diuresis after Lasix, she received total 3 doses of IV Lasix 40 mg every 12 hours, due to her rising creatinine, no further Lasix at this point in time.  Hypertension: Much better controlled, she was not taking any Cozaar at home, blood pressure was elevated so Cozaar was resumed here.  Now holding Cozaar due to rising creatinine, monitor.  Hyperlipidemia: Continue statin.  AKI on CKD, stage IIIb: Her baseline creatinine range of 1.3-1.6.  Slightly worse creatinine today, likely due to diuresis.  No further diuresis, hold Cozaar and Farxiga as well.  Repeat labs in the morning.  DM, type II: Hemoglobin A1c 6.1.  Blood sugar controlled. Continue SSI.  Chronic arthritis of bilateral knee: Continue current pain medications.  She is requesting to prescribe some oxycodone at the time of discharge.  DVT prophylaxis: SCDs Start: 05/01/21 0003   Code Status: DNR  Family Communication:  husband at  bedside.  I answered multiple questions that husband and patient had.  At the end of our conversation, they seemed satisfied. Multiple opportunities given to them to ask more questions. Status is: Inpatient  Remains inpatient appropriate because: Needs close monitoring and management of submassive PE  Estimated body mass index is 46.51 kg/m as calculated from the following:   Height as of this  encounter: 5\' 3"  (1.6 m).   Weight as of this encounter: 119.1 kg.     Nutritional Assessment: Body mass index is 46.51 kg/m.Marland Kitchen Seen by dietician.  I agree with the assessment and plan as outlined below: Nutrition Status:  Skin Assessment: I have examined the patient's skin and I agree with the wound assessment as performed by the wound care RN as outlined below:    Consultants:  None  Procedures:  None  Antimicrobials:  Anti-infectives (From admission, onward)    None          Subjective:  Pt seen and examined early this AM. Husband Pamela [name] at bedside. Opportunity given to both of them to ask questions. Pt remains on 1-2 L supplemental O2. Pt has developed intermittent cough. No bleeding. Still feels weak in the legs. Pt desires to go home and not to SNF.  Objective: Vitals:   05/04/21 2345 05/05/21 0338 05/05/21 0720 05/05/21 1153  BP: (!) 123/55 (!) 147/67 130/66 107/62  Pulse: 67 66 80 66  Resp: 20 20 14 20   Temp: 98.3 F (36.8 C) 98 F (36.7 C) 97.6 F (36.4 C) (!) 97.5 F (36.4 C)  TempSrc: Oral Oral Oral Oral  SpO2: 91% 92% 96% 93%  Weight:      Height:        Intake/Output Summary (Last 24 hours) at 05/05/2021 1341 Last data filed at 05/05/2021 0338 Gross per 24 hour  Intake --  Output 600 ml  Net -600 ml   Filed Weights   05/02/21 0500 05/03/21 0411 05/04/21 0645  Weight: 118.1 kg 118.1 kg 119.1 kg    Examination:  Physical Exam Vitals and nursing note reviewed.  Constitutional:      General: She is not in acute distress.    Appearance: Normal appearance. She is obese. She is not ill-appearing, toxic-appearing or diaphoretic.  HENT:     Head: Normocephalic and atraumatic.     Nose: Nose normal. No rhinorrhea.  Cardiovascular:     Rate and Rhythm: Normal rate and regular rhythm.  Pulmonary:     Effort: Pulmonary effort is normal. No respiratory distress.     Breath sounds: Examination of the left-upper field reveals wheezing. Wheezing  present. No rhonchi or rales.  Abdominal:     General: Bowel sounds are normal. There is no distension.     Palpations: Abdomen is soft.     Tenderness: There is no abdominal tenderness. There is no guarding or rebound.  Musculoskeletal:     Right lower leg: No edema.     Left lower leg: No edema.  Skin:    General: Skin is warm and dry.     Capillary Refill: Capillary refill takes less than 2 seconds.  Neurological:     General: No focal deficit present.     Mental Status: She is alert and oriented to person, place, and time.     Data Reviewed: I have personally reviewed following labs and imaging studies  CBC: Recent Labs  Lab 04/30/21 1423 05/01/21 0310 05/02/21 0203 05/03/21 0120 05/04/21 0213  WBC 10.6* 13.1* 12.8* 12.9* 12.8*  NEUTROABS 9.2*  --   --   --   --   HGB 15.4* 15.3* 14.4 14.5 14.4  HCT 46.9* 46.1* 44.0 44.6 44.5  MCV 94.0 92.4 93.2 92.9 92.7  PLT 153 155 150 143* 001*   Basic Metabolic Panel: Recent Labs  Lab 04/30/21 1423 05/01/21 0310 05/03/21 0120 05/04/21 0213 05/05/21 0233  NA 142 140 137 136 136  K 4.3 3.9 3.7 3.7 3.7  CL 108 104 103 96* 99  CO2 26 26 27 29 27   GLUCOSE 135* 107* 121* 101* 90  BUN 29* 28* 33* 48* 59*  CREATININE 1.31* 1.39* 1.63* 2.06* 1.97*  CALCIUM 9.0 8.5* 8.4* 8.6* 8.4*  MG  --  1.8  --   --   --   PHOS  --  3.6  --   --   --    GFR: Estimated Creatinine Clearance: 26.5 mL/min (A) (by C-G formula based on SCr of 1.97 mg/dL (H)). Liver Function Tests: Recent Labs  Lab 04/30/21 1423  AST 16  ALT 23  ALKPHOS 76  BILITOT 0.7  PROT 7.3  ALBUMIN 3.7   No results for input(s): LIPASE, AMYLASE in the last 168 hours. No results for input(s): AMMONIA in the last 168 hours. Coagulation Profile: No results for input(s): INR, PROTIME in the last 168 hours. Cardiac Enzymes: No results for input(s): CKTOTAL, CKMB, CKMBINDEX, TROPONINI in the last 168 hours. BNP (last 3 results) No results for input(s): PROBNP in the  last 8760 hours. HbA1C: No results for input(s): HGBA1C in the last 72 hours.  CBG: Recent Labs  Lab 05/04/21 1636 05/04/21 2148 05/05/21 0337 05/05/21 0804 05/05/21 1155  GLUCAP 100* 115* 96 93 110*   Lipid Profile: No results for input(s): CHOL, HDL, LDLCALC, TRIG, CHOLHDL, LDLDIRECT in the last 72 hours. Thyroid Function Tests: No results for input(s): TSH, T4TOTAL, FREET4, T3FREE, THYROIDAB in the last 72 hours. Anemia Panel: No results for input(s): VITAMINB12, FOLATE, FERRITIN, TIBC, IRON, RETICCTPCT in the last 72 hours. Sepsis Labs: No results for input(s): PROCALCITON, LATICACIDVEN in the last 168 hours.  Recent Results (from the past 240 hour(s))  Resp Panel by RT-PCR (Flu A&B, Covid) Nasopharyngeal Swab     Status: Abnormal   Collection Time: 04/30/21  4:50 PM   Specimen: Nasopharyngeal Swab; Nasopharyngeal(NP) swabs in vial transport medium  Result Value Ref Range Status   SARS Coronavirus 2 by RT PCR POSITIVE (A) NEGATIVE Final    Comment: RESULT CALLED TO, READ BACK BY AND VERIFIED WITH: Veneda Melter 1806 04/30/2021 DBRADLEY (NOTE) SARS-CoV-2 target nucleic acids are DETECTED.  The SARS-CoV-2 RNA is generally detectable in upper respiratory specimens during the acute phase of infection. Positive results are indicative of the presence of the identified virus, but do not rule out bacterial infection or co-infection with other pathogens not detected by the test. Clinical correlation with patient history and other diagnostic information is necessary to determine patient infection status. The expected result is Negative.  Fact Sheet for Patients: EntrepreneurPulse.com.au  Fact Sheet for Healthcare Providers: IncredibleEmployment.be  This test is not yet approved or cleared by the [place] FDA and  has been authorized for detection and/or diagnosis of SARS-CoV-2 by FDA under an Emergency Use Authorization (EUA).  This  EUA will remain in effect (meaning this test c an be used) for the duration of  the COVID-19 declaration under Section 564(b)(1) of the Act, 21 U.S.C. section 360bbb-3(b)(1), unless the authorization is terminated or revoked sooner.  Influenza A by PCR NEGATIVE NEGATIVE Final   Influenza B by PCR NEGATIVE NEGATIVE Final    Comment: (NOTE) The Xpert Xpress SARS-CoV-2/FLU/RSV plus assay is intended as an aid in the diagnosis of influenza from Nasopharyngeal swab specimens and should not be used as a sole basis for treatment. Nasal washings and aspirates are unacceptable for Xpert Xpress SARS-CoV-2/FLU/RSV testing.  Fact Sheet for Patients: EntrepreneurPulse.com.au  Fact Sheet for Healthcare Providers: IncredibleEmployment.be  This test is not yet approved or cleared by the [place] FDA and has been authorized for detection and/or diagnosis of SARS-CoV-2 by FDA under an Emergency Use Authorization (EUA). This EUA will remain in effect (meaning this test can be used) for the duration of the COVID-19 declaration under Section 564(b)(1) of the Act, 21 U.S.C. section 360bbb-3(b)(1), unless the authorization is terminated or revoked.  Performed at [facility], [address], [place], [place] [address]      Radiology Studies: No results found.  Scheduled Meds:  apixaban  10 mg Oral BID   Followed by   Derrill Memo ON 05/11/2021] apixaban  5 mg Oral BID   carvedilol  12.5 mg Oral BID WC   insulin aspart  0-15 Units Subcutaneous Q4H   pravastatin  20 mg Oral q1800   Continuous Infusions:   LOS: 5 days   Time spent: 68 minutes  Kristopher Oppenheim, DO [facility]  05/05/2021, 1:41 PM  Please page via [place] and do not message via secure chat for anything urgent. Secure chat can be used for anything non urgent.  How to contact the [facility] Attending or Consulting provider [place] or covering provider during after hours [place], for this patient?  Check the care team in [facility] and look for a) attending/consulting TRH provider listed and b) the [facility] team listed. Page or secure chat [phone]. Log into [email or web address] and use [place]'s universal password to access. If you do not have the password, please contact the hospital operator. Locate the [facility] provider you are looking for under [facility] and page to a number that you can be directly reached. If you still have difficulty reaching the provider, please page the [facility] (Director on Call) for the Hospitalists listed on amion for assistance.

## 2021-05-05 NOTE — TOC Progression Note (Signed)
Transition of Care [facility]) - Progression Note    Patient Details  Name: Pamela [name] MRN: [id number] Date of Birth: 1934/12/22  [facility] CM/SW Contact  Carles Collet, RN Phone Number: 05/05/2021, 1:48 PM  Clinical Narrative:    Spoke to patient over the phone. She is agreeable to [facility] PT Aid as ordered. She does not have preference for agency. Referral made to [facility], accepting. She confirms that she has DME RW and shower seat at home, she is requesting Bariatric 3/1. Order placed and referral made to [place] who will also follow for potential home oxygen needs. Patient states that they plan on weaning off oxygen prior to DC, but [employer] will follow if needed. Please notify [employer] if needed. Patient states that her spouse will be able to provide transportation home.     Expected Discharge Plan: [place] Barriers to Discharge: Continued Medical Work up  Expected Discharge Plan and Services Expected Discharge Plan: [place] In-house Referral: Clinical Social Work Discharge Planning Services: CM Consult Post Acute Care Choice: Durable Medical Equipment, Home Health                   DME Arranged: 3-N-1 DME Agency: [facility] Date DME Agency Contacted: 05/05/21 Time DME Agency Contacted: 1347 Representative spoke with at DME Agency: [facility] HH Arranged: PT, Nurse's Aide [place] Agency: [place] Date [place]: 05/05/21 Time [place]: 2 Representative spoke with at [place]: [place] Determinants of Health ([place]) Interventions    Readmission Risk Interventions No flowsheet data found.

## 2021-05-06 ENCOUNTER — Other Ambulatory Visit (HOSPITAL_COMMUNITY): Payer: Self-pay

## 2021-05-06 DIAGNOSIS — I2609 Other pulmonary embolism with acute cor pulmonale: Secondary | ICD-10-CM | POA: Diagnosis not present

## 2021-05-06 DIAGNOSIS — I10 Essential (primary) hypertension: Secondary | ICD-10-CM | POA: Diagnosis not present

## 2021-05-06 DIAGNOSIS — J9601 Acute respiratory failure with hypoxia: Secondary | ICD-10-CM | POA: Diagnosis not present

## 2021-05-06 DIAGNOSIS — U071 COVID-19: Secondary | ICD-10-CM | POA: Diagnosis not present

## 2021-05-06 LAB — CBC WITH DIFFERENTIAL/PLATELET
Abs Immature Granulocytes: 0.07 10*3/uL (ref 0.00–0.07)
Basophils Absolute: 0 10*3/uL (ref 0.0–0.1)
Basophils Relative: 0 %
Eosinophils Absolute: 0.2 10*3/uL (ref 0.0–0.5)
Eosinophils Relative: 1 %
HCT: 44.8 % (ref 36.0–46.0)
Hemoglobin: 15.3 g/dL — ABNORMAL HIGH (ref 12.0–15.0)
Immature Granulocytes: 1 %
Lymphocytes Relative: 9 %
Lymphs Abs: 1.2 10*3/uL (ref 0.7–4.0)
MCH: 31.2 pg (ref 26.0–34.0)
MCHC: 34.2 g/dL (ref 30.0–36.0)
MCV: 91.4 fL (ref 80.0–100.0)
Monocytes Absolute: 1.3 10*3/uL — ABNORMAL HIGH (ref 0.1–1.0)
Monocytes Relative: 9 %
Neutro Abs: 10.7 10*3/uL — ABNORMAL HIGH (ref 1.7–7.7)
Neutrophils Relative %: 80 %
Platelets: 174 10*3/uL (ref 150–400)
RBC: 4.9 MIL/uL (ref 3.87–5.11)
RDW: 13.7 % (ref 11.5–15.5)
WBC: 13.4 10*3/uL — ABNORMAL HIGH (ref 4.0–10.5)
nRBC: 0 % (ref 0.0–0.2)

## 2021-05-06 LAB — COMPREHENSIVE METABOLIC PANEL
ALT: 16 U/L (ref 0–44)
AST: 11 U/L — ABNORMAL LOW (ref 15–41)
Albumin: 2.8 g/dL — ABNORMAL LOW (ref 3.5–5.0)
Alkaline Phosphatase: 60 U/L (ref 38–126)
Anion gap: 10 (ref 5–15)
BUN: 68 mg/dL — ABNORMAL HIGH (ref 8–23)
CO2: 27 mmol/L (ref 22–32)
Calcium: 8.8 mg/dL — ABNORMAL LOW (ref 8.9–10.3)
Chloride: 99 mmol/L (ref 98–111)
Creatinine, Ser: 1.9 mg/dL — ABNORMAL HIGH (ref 0.44–1.00)
GFR, Estimated: 26 mL/min — ABNORMAL LOW (ref >60)
Glucose, Bld: 106 mg/dL — ABNORMAL HIGH (ref 70–99)
Potassium: 3.7 mmol/L (ref 3.5–5.1)
Sodium: 136 mmol/L (ref 135–145)
Total Bilirubin: 1.5 mg/dL — ABNORMAL HIGH (ref 0.3–1.2)
Total Protein: 6.9 g/dL (ref 6.5–8.1)

## 2021-05-06 LAB — GLUCOSE, CAPILLARY
Glucose-Capillary: 119 mg/dL — ABNORMAL HIGH (ref 70–99)
Glucose-Capillary: 174 mg/dL — ABNORMAL HIGH (ref 70–99)
Glucose-Capillary: 110 mg/dL — ABNORMAL HIGH (ref 70–99)

## 2021-05-06 MED ORDER — APIXABAN 5 MG PO TABS
ORAL_TABLET | ORAL | 0 refills | Status: DC
  Filled 2021-05-06: qty 74, 30d supply, fill #0

## 2021-05-06 MED ORDER — FUROSEMIDE 20 MG PO TABS: 20.0000 mg | ORAL_TABLET | Freq: Every day | ORAL | 11 refills | Status: DC | PRN

## 2021-05-06 MED ORDER — CARVEDILOL 12.5 MG PO TABS
12.5000 mg | ORAL_TABLET | Freq: Two times a day (BID) | ORAL | 1 refills | Status: DC
  Filled 2021-05-06: qty 60, 30d supply, fill #0

## 2021-05-06 NOTE — Plan of Care (Signed)
  Problem: Education: Goal: Knowledge of General Education information will improve Description: Including pain rating scale, medication(s)/side effects and non-pharmacologic comfort measures Outcome: Adequate for Discharge   Problem: Health Behavior/Discharge Planning: Goal: Ability to manage health-related needs will improve Outcome: Adequate for Discharge   Problem: Clinical Measurements: Goal: Ability to maintain clinical measurements within normal limits will improve Outcome: Adequate for Discharge Goal: Will remain free from infection Outcome: Adequate for Discharge Goal: Diagnostic test results will improve Outcome: Adequate for Discharge Goal: Respiratory complications will improve Outcome: Adequate for Discharge Goal: Cardiovascular complication will be avoided Outcome: Adequate for Discharge   Problem: Activity: Goal: Risk for activity intolerance will decrease Outcome: Adequate for Discharge   Problem: Nutrition: Goal: Adequate nutrition will be maintained Outcome: Adequate for Discharge   Problem: Coping: Goal: Level of anxiety will decrease Outcome: Adequate for Discharge   Problem: Elimination: Goal: Will not experience complications related to bowel motility Outcome: Adequate for Discharge Goal: Will not experience complications related to urinary retention Outcome: Adequate for Discharge   Problem: Pain Managment: Goal: General experience of comfort will improve Outcome: Adequate for Discharge   Problem: Safety: Goal: Ability to remain free from injury will improve Outcome: Adequate for Discharge   Problem: Skin Integrity: Goal: Risk for impaired skin integrity will decrease Outcome: Adequate for Discharge   Problem: Acute Rehab PT Goals(only PT should resolve) Goal: Patient Will Transfer Sit To/From Stand Outcome: Adequate for Discharge Goal: Pt Will Transfer Bed To Chair/Chair To Bed Outcome: Adequate for Discharge Goal: Pt Will  Ambulate Outcome: Adequate for Discharge   Problem: Acute Rehab OT Goals (only OT should resolve) Goal: Pt. Will Perform Grooming Outcome: Adequate for Discharge Goal: Pt. Will Perform Lower Body Bathing Outcome: Adequate for Discharge Goal: Pt. Will Perform Lower Body Dressing Outcome: Adequate for Discharge Goal: Pt. Will Transfer To Toilet Outcome: Adequate for Discharge Goal: Pt. Will Perform Toileting-Clothing Manipulation Outcome: Adequate for Discharge Goal: OT Additional ADL Goal #1 Outcome: Adequate for Discharge   Problem: Acute Rehab PT Goals(only PT should resolve) Goal: Pt Will Go Up/Down Stairs Outcome: Adequate for Discharge

## 2021-05-06 NOTE — Progress Notes (Signed)
Discharge summary reviewed with pt and husband.  Eliquis and carvedilol received from the [place], O2, BSC received by patient.  CCMD notified.  IV access discontinued.  Assisted to private vehicle via staff

## 2021-05-06 NOTE — TOC Transition Note (Signed)
Transition of Care (TOC) - CM/SW Discharge Note Marvetta Gibbons RN, BSN Transitions of Care Unit 4E- RN Case Manager See Treatment Team for direct phone #    Patient Details  Name: Pamela [name] MRN: [id number] Date of Birth: [birth year]  Transition of Care [facility]) CM/SW Contact:  Dawayne Patricia, RN Phone Number: 05/06/2021, 4:05 PM   Clinical Narrative:    Pt stable for transition home today, have been notified that pt will need home 02- order has been placed. Call made to [facility] with [employer] for home o2 needs as previous CM noted [employer] was following for further DME needs.  [facility] will process 02 and deliver portable tank to room for transport home.   HH has been set up with [facility]- they will call pt post discharge to schedule [facility] visits.   Spouse to transport home. Pt new to Eliquis, will see if TOC can fill first 30 day script with 30 day coupon.    Final next level of care: [place] Barriers to Discharge: Barriers Resolved   Patient Goals and CMS Choice Patient states their goals for this hospitalization and ongoing recovery are:: to go home CMS Medicare.gov Compare Post Acute Care list provided to:: Patient Choice offered to / list presented to : Patient  Discharge Placement               Home w/ [facility]        Discharge Plan and Services In-house Referral: Clinical Social Work Discharge Planning Services: CM Consult Post Acute Care Choice: Durable Medical Equipment, Home Health          DME Arranged: Oxygen DME Agency: [facility] Date DME Agency Contacted: 05/06/21 Time DME Agency Contacted: [phone] Representative spoke with at DME Agency: [facility] HH Arranged: PT, Nurse's Aide [place] Agency: [place] Date [place]: 05/05/21 Time [place]: [id number] Representative spoke with at [place]: [place] ([place]) Interventions     Readmission Risk Interventions Readmission  Risk Prevention Plan 05/06/2021  Transportation Screening Complete  PCP or Specialist Appt within 5-7 Days Complete  Home Care Screening Complete  Medication Review (RN CM) Complete  Some recent data might be hidden

## 2021-05-06 NOTE — Progress Notes (Signed)
SATURATION QUALIFICATIONS: (This note is used to comply with regulatory documentation for home oxygen)  Patient Saturations on Room Air at Rest = 89%  Patient Saturations on Room Air while Ambulating = 86%  Patient Saturations on 1 Liters of oxygen while Ambulating = 93%  Please briefly explain why patient needs home oxygen:

## 2021-05-06 NOTE — Discharge Summary (Signed)
Physician Discharge Summary  Sueellen Kayes Forman HYQ:[id number] DOB: 09-30-1934 DOA: 04/30/2021  PCP: Orpah Melter, MD  Admit date: 04/30/2021 Discharge date: 05/26/2021  Admitted From: home Disposition:  home  Recommendations for Outpatient Follow-up:  Follow up with PCP in 1-2 weeks Please obtain BMP/CBC in one week Please follow up use of Eliquis for PE  Home Health: PT, aide  Equipment/Devices: oxygen   Discharge Condition: stable  CODE STATUS: DNR  Diet recommendation: Carb Modified      Discharge Diagnoses: Active Problems:   Essential hypertension   Acute respiratory failure with hypoxia (HCC)   Bilateral pulmonary embolism (HCC)   Acute pulmonary embolism ([facility])   COVID-19 virus infection   Pulmonary embolism (HCC)    Summary of HPI and Hospital Course:  "86 yo female with known diastolic heart failure, diabetes mellitus, hypertension, CKD III admitted with pulmonary embolism with recent COVID diagnosis.   Patient was seen in PCP office on 10/14 for dyspnea with exertion, she was found to be + for COVID and started on Paxlovid. She was seen in follow up with her PCP and prescribed albuterol, guaifenesin, and prednisone for worsening symptoms.  Despite medical therapy, patient has continued to have increased dyspnea and SpO2 sats between 88% and 93% on room air.  She presented to urgent care for evaluation. VS on arrival:  SpO2 90% on 3 L , temp 98, HR: 115, BP 188/118. CTA Chest was completed and showed pulmonary embolism in the left and right pulmonary artery with segmental and subsegmental embolism.  RV:LV ratio reported as elevated. BNP 418, troponin HS 253>307.  She received Lasix 40 mg x 1, Hydralazine 26 mg PO x 1 and started on Heparin infusion.  Transferred under [facility] on 05/02/2021. "    Acute hypoxic respiratory failure secondary to submassive pulmonary embolism and acute on chronic diastolic congestive heart failure: Seen on CTA on 10/28, suspect secondary to  COVID-19 infection and increasingly being sedentary at home.  Troponin elevated, likely due to demand ischemia.   BNP also elevated.   Echo shows RV strain with global hypokinesis.   EKG no acute ST-T wave changes.   Patient has improved after started on diuresis.   Stable on 1 to 2 L of oxygen, and was able to be weaned to room air prior to discharge.  No shortness of breath.    Troponin elevation, due to demand ischemia in setting of PE   Hx of COVID-19:  not an active infection currently. Diagnosed 10/14, s/p paxlovid 5 day course, 3 days stereoids outpatient.  Respiratory status has been stable. Not on isolation since post 10 days positive test. Supportive care.   Acute on chronic diastolic congestive heart failure: Chest x-ray shows possible pulm edema, BNP also elevated, likely exacerbated due to PE.  Patient had significant diuresis after Lasix, she received total 3 doses of IV Lasix 40 mg every 12 hours.  Diuresis stopped when Cr increased.    Pt appears euvolemic at time of discharge.   Hypertension: Better controlled. Not taking any Cozaar at home, BP initially elevated so Cozaar was resumed here, later stopped again with rising creatinine.   Hyperlipidemia: Continue statin.   AKI on CKD, stage IIIb: baseline creatinine 1.3-1.6.   Cr increased slightly with diuresis which was then held.   DM, type II: Hemoglobin A1c 6.1.  Blood sugar controlled. Covered with SSI. PCP follow up.   Chronic arthritis of bilateral knee: Continue current pain medications.     Discharge Instructions  Discharge Instructions     (HEART FAILURE PATIENTS) Call MD:  Anytime you have any of the following symptoms: 1) 3 pound weight gain in 24 hours or 5 pounds in 1 week 2) shortness of breath, with or without a dry hacking cough 3) swelling in the hands, feet or stomach 4) if you have to sleep on extra pillows at night in order to breathe.   Complete by: As directed    Call MD for:  extreme  fatigue   Complete by: As directed    Call MD for:  persistant dizziness or light-headedness   Complete by: As directed    Call MD for:  persistant nausea and vomiting   Complete by: As directed    Call MD for:  severe uncontrolled pain   Complete by: As directed    Call MD for:  temperature >100.4   Complete by: As directed    Diet - low sodium heart healthy   Complete by: As directed    Discharge instructions   Complete by: As directed    ELIQUIS ---> please take 10 mg twice daily for another 4-1/2 days (this evening, then 4 more days).  After that, you can go down to just 5 mg twice daily.    Please use the oxygen (as needed), if your oxygen level is dropping below 90%.   You might only need to use oxygen when you're up walking around.    Please have your Primary Care provider check labs early next week. Your kidney numbers slightly elevated above your normal and should be re-checked. Please HOLD furosemide (Lasix) until you have labs done - UNLESS you have worsening shortness of breath or swelling, gaining weight (see below) or having to turn oxygen up above 2 L/min.   Increase activity slowly   Complete by: As directed       Allergies as of 05/06/2021       Reactions   Levaquin [levofloxacin In D5w] Nausea And Vomiting        Medication List     STOP taking these medications    predniSONE 20 MG tablet Commonly known as: DELTASONE       TAKE these medications    acetaminophen 500 MG tablet Commonly known as: TYLENOL Take 1 tablet (500 mg total) by mouth every 6 (six) hours as needed for moderate pain.   albuterol 108 (90 Base) MCG/ACT inhaler Commonly known as: VENTOLIN HFA Inhale 2 puffs into the lungs in the morning, at noon, in the evening, and at bedtime. For 30 days. Started 04/27/21.   Align 4 MG Caps Take 4 mg by mouth daily.   calcium carbonate 1500 (600 Ca) MG Tabs tablet Commonly known as: OSCAL Take 1,500 mg by mouth 2 (two) times daily with  a meal.   carvedilol 12.5 MG tablet Commonly known as: COREG Take 1 tablet (12.5 mg total) by mouth 2 (two) times daily with a meal. What changed:  medication strength how much to take   dapagliflozin propanediol 10 MG Tabs tablet Commonly known as: FARXIGA Take 10 mg by mouth daily.   Eliquis 5 MG Tabs tablet Generic drug: apixaban Take 2 tablets (10 mg) by mouth twice daily for 5 days, then decrease to 1 tablet (5 mg) by mouth twice daily. Call your PCP for refills   furosemide 20 MG tablet Commonly known as: Lasix Take 1 tablet (20 mg total) by mouth daily as needed for edema (or weight gain). What changed:  when  to take this reasons to take this   Guaiatussin AC 100-10 MG/5ML syrup Generic drug: guaiFENesin-codeine Take 5-10 mLs by mouth every 6 (six) hours as needed for cough. Take 1 hour before meals for 5 days. Started 04/27/2021.   lip balm ointment Apply 1 application topically 2 (two) times daily.   lovastatin 20 MG tablet Commonly known as: MEVACOR Take 20 mg by mouth at bedtime.   OVER THE COUNTER MEDICATION Take 1 capsule by mouth daily. Daily fiber 100% psyllium.       ASK your doctor about these medications    oxyCODONE 5 MG immediate release tablet Commonly known as: Oxy IR/ROXICODONE Take 1 tablet (5 mg total) by mouth every 6 (six) hours as needed for up to 7 days for breakthrough pain or severe pain (PRN pain not controlled by Tylenol.). Ask about: Should I take this medication?        Follow-up Information     Hunsucker, [name], MD Follow up.   Specialty: Pulmonary Disease Why: right heart dysfunction Contact information: [address] [place] [address] [phone]         [place], Odell Follow up.   Specialty: Home Health Services Why: for home health services. They will call you in 1-2 days to set up your first home visit. Contact information: [place] [address] [phone]                Allergies  Allergen Reactions   Levaquin [Levofloxacin In D5w] Nausea And Vomiting     If you experience worsening of your admission symptoms, develop shortness of breath, life threatening emergency, suicidal or homicidal thoughts you must seek medical attention immediately by calling 911 or calling your MD immediately  if symptoms less severe.    Please note   You were cared for by a hospitalist during your hospital stay. If you have any questions about your discharge medications or the care you received while you were in the hospital after you are discharged, you can call the unit and asked to speak with the hospitalist on call if the hospitalist that took care of you is not available. Once you are discharged, your primary care physician will handle any further medical issues. Please note that NO REFILLS for any discharge medications will be authorized once you are discharged, as it is imperative that you return to your primary care physician (or establish a relationship with a primary care physician if you do not have one) for your aftercare needs so that they can reassess your need for medications and monitor your lab values.   Consultations: none    Procedures/Studies: CT Angio Chest PE W and/or Wo Contrast  Result Date: 04/30/2021 CLINICAL DATA:  Evaluate for pulmonary embolus EXAM: CT ANGIOGRAPHY CHEST WITH CONTRAST TECHNIQUE: Multidetector CT imaging of the chest was performed using the standard protocol during bolus administration of intravenous contrast. Multiplanar CT image reconstructions and MIPs were obtained to evaluate the vascular anatomy. CONTRAST:  21mL OMNIPAQUE IOHEXOL 350 MG/ML SOLN COMPARISON:  CT abdomen and pelvis dated April 29, 2017 FINDINGS: Cardiovascular: Pulmonary embolus seen in the left and right pulmonary arteries and in multiple bilateral lobar, segmental and subsegmental pulmonary arteries Mild cardiomegaly.  Elevated RV to LV ratio, suggesting right heart strain. No pericardial effusion. Three-vessel coronary artery calcifications. Severe atherosclerotic disease of the thoracic aorta with areas of ulcerated soft plaque. Mediastinum/Nodes: Esophagus and thyroid are unremarkable. No pathologically enlarged  lymph nodes seen in the chest. Lungs/Pleura: Central airways are patent. Expiratory phase imaging with air trapping and bilateral subsegmental atelectasis. No consolidation, pleural effusion or pneumothorax. Upper Abdomen: Cholecystectomy clips. Dilated common bile duct, unchanged compared to prior CT of the abdomen and pelvis. Simple cyst of the right kidney. No acute abnormality. Musculoskeletal: No chest wall abnormality. No acute or significant osseous findings. Review of the MIP images confirms the above findings. IMPRESSION: 1. Pulmonary embolus seen in the left and right pulmonary arteries and in multiple bilateral lobar, segmental and subsegmental pulmonary arteries. 2. CT evidence of right heart strain. Correlate with echocardiography. 3. Severe atherosclerotic disease of the thoracic aorta with areas of ulcerated soft plaque. Aortic Atherosclerosis (ICD10-I70.0). 4. Bilateral air trapping, likely due to small airways disease. 5. Three-vessel coronary artery calcifications. Critical Value/emergent results were called by telephone at the time of interpretation on 04/30/2021 at 4:55 pm to provider Isla Pence MD, who verbally acknowledged these results. Electronically Signed   By: Yetta Glassman M.D.   On: 04/30/2021 17:04   DG Chest Port 1 View  Result Date: 04/30/2021 CLINICAL DATA:  Shortness of breath EXAM: PORTABLE CHEST 1 VIEW COMPARISON:  Chest x-ray dated October 09, 2019 FINDINGS: Cardiac and mediastinal contours are unchanged. Low lung volumes with hypoventilatory changes. Mild bilateral heterogeneous opacities, possibly due to atelectasis. No focal consolidation, large pleural effusion or  evidence of pneumothorax. IMPRESSION: Low lung volumes with hypoventilatory changes. Possible mild pulmonary edema versus atelectasis. No focal consolidation. Electronically Signed   By: Yetta Glassman M.D.   On: 04/30/2021 13:01   ECHOCARDIOGRAM COMPLETE  Result Date: 05/01/2021    ECHOCARDIOGRAM REPORT   Patient Name:   Pamela [name] Date of Exam: 05/01/2021 Medical Rec #:  [id number]      Height:       63.0 in Accession #:    [id number]     Weight:       258.8 lb Date of Birth:  03-Aug-1934       BSA:          2.158 m Patient Age:    3 years       BP:           155/92 mmHg Patient Gender: F              HR:           73 bpm. Exam Location:  Inpatient Procedure: 2D Echo, Color Doppler and Cardiac Doppler Indications:    Pulmonary embolism  History:        Patient has prior history of Echocardiogram examinations, most                 recent 10/10/2019. CT 04/30/21 which showed right heart strain;                 Risk Factors:Diabetes and Dyslipidemia. Covid 19 twice. CKD.  Sonographer:    Merrie Roof RDCS Referring Phys: [place]  1. Qualitatively, Right ventricular systolic function is mild to moderately reduced; TAPSE and tissue Doppler not performed. The right ventricular size is moderately enlarged. There is normal pulmonary artery systolic pressure. The estimated right ventricular systolic pressure is 46.9 mmHg.  2. Left ventricular ejection fraction, by estimation, is 55 to 60%. The left ventricle has normal function. The left ventricle demonstrates regional wall motion abnormalities (abnormal septal motion- there may be related to RV dysfunction). There is moderate concentric left ventricular hypertrophy. Left ventricular diastolic parameters  are consistent with Grade I diastolic dysfunction (impaired relaxation). There is the interventricular septum is flattened in systole and diastole, consistent with right ventricular pressure and volume overload.  3. The mitral valve is  normal in structure. No evidence of mitral valve regurgitation. No evidence of mitral stenosis.  4. The aortic valve is tricuspid. Aortic valve regurgitation is not visualized. No aortic stenosis is present. Comparison(s): A prior study was performed on 10/10/2019. RV dysfunction is new. FINDINGS  Left Ventricle: Left ventricular ejection fraction, by estimation, is 55 to 60%. The left ventricle has normal function. The left ventricle demonstrates regional wall motion abnormalities. The left ventricular internal cavity size was normal in size. There is moderate concentric left ventricular hypertrophy. The interventricular septum is flattened in systole and diastole, consistent with right ventricular pressure and volume overload. Left ventricular diastolic parameters are consistent with Grade I  diastolic dysfunction (impaired relaxation). Right Ventricle: The right ventricular size is moderately enlarged. Right vetricular wall thickness was not well visualized. Right ventricular systolic function is moderately reduced. There is normal pulmonary artery systolic pressure. The tricuspid regurgitant velocity is 2.83 m/s, and with an assumed right atrial pressure of 3 mmHg, the estimated right ventricular systolic pressure is 54.0 mmHg. Left Atrium: Left atrial size was normal in size. Right Atrium: Right atrial size was normal in size. Pericardium: There is no evidence of pericardial effusion. Mitral Valve: The mitral valve is normal in structure. No evidence of mitral valve regurgitation. No evidence of mitral valve stenosis. Tricuspid Valve: The tricuspid valve is normal in structure. Tricuspid valve regurgitation is mild. Aortic Valve: The aortic valve is tricuspid. Aortic valve regurgitation is not visualized. No aortic stenosis is present. Aortic valve mean gradient measures 4.0 mmHg. Aortic valve peak gradient measures 7.2 mmHg. Aortic valve area, by VTI measures 1.86 cm. Pulmonic Valve: The pulmonic valve was  normal in structure. Pulmonic valve regurgitation is trivial. Aorta: The aortic root and ascending aorta are structurally normal, with no evidence of dilitation. IAS/Shunts: The atrial septum is grossly normal.  LEFT VENTRICLE PLAX 2D LVIDd:         4.00 cm   Diastology LVIDs:         2.80 cm   LV e' medial:    5.82 cm/s LV PW:         1.40 cm   LV E/e' medial:  11.3 LV IVS:        1.20 cm   LV e' lateral:   7.42 cm/s LVOT diam:     1.90 cm   LV E/e' lateral: 8.8 LV SV:         51 LV SV Index:   24 LVOT Area:     2.84 cm  RIGHT VENTRICLE          IVC RV Basal diam:  3.60 cm  IVC diam: 2.00 cm LEFT ATRIUM             Index        RIGHT ATRIUM           Index LA diam:        4.50 cm 2.09 cm/m   RA Area:     19.80 cm LA Vol (A2C):   70.3 ml 32.58 ml/m  RA Volume:   58.60 ml  27.16 ml/m LA Vol (A4C):   40.4 ml 18.70 ml/m LA Biplane Vol: 66.9 ml 31.01 ml/m  AORTIC VALVE AV Area (Vmax):    1.95 cm AV Area (Vmean):  1.97 cm AV Area (VTI):     1.86 cm AV Vmax:           134.00 cm/s AV Vmean:          89.100 cm/s AV VTI:            0.273 m AV Peak Grad:      7.2 mmHg AV Mean Grad:      4.0 mmHg LVOT Vmax:         92.20 cm/s LVOT Vmean:        61.800 cm/s LVOT VTI:          0.179 m LVOT/AV VTI ratio: 0.66  AORTA Ao Root diam: 3.50 cm Ao Asc diam:  3.10 cm MITRAL VALVE                TRICUSPID VALVE MV Area (PHT): 3.93 cm     TR Peak grad:   32.0 mmHg MV Decel Time: 193 msec     TR Vmax:        283.00 cm/s MV E velocity: 65.50 cm/s MV A velocity: 119.00 cm/s  SHUNTS MV E/A ratio:  0.55         Systemic VTI:  0.18 m                             Systemic Diam: 1.90 cm Rudean Haskell MD Electronically signed by Rudean Haskell MD Signature Date/Time: 05/01/2021/1:05:03 PM    Final        Subjective: Pt reports feeling well today.  No SOB, CP, dizziness or other complaints.  On room air and reports breathing fine.  Eager to go home.    Discharge Exam: Vitals:   05/06/21 0805 05/06/21 1109  BP:  130/63 104/60  Pulse: 74 (!) 55  Resp: 20 20  Temp: (!) 97.4 F (36.3 C) 97.6 F (36.4 C)  SpO2: 90%    Vitals:   05/05/21 2355 05/06/21 0409 05/06/21 0805 05/06/21 1109  BP: (!) 145/74 135/65 130/63 104/60  Pulse: 65 80 74 (!) 55  Resp: 18 16 20 20   Temp: 98.1 F (36.7 C) 97.6 F (36.4 C) (!) 97.4 F (36.3 C) 97.6 F (36.4 C)  TempSrc: Oral Oral Oral Oral  SpO2: 96%  90%   Weight:      Height:        General: Pt is alert, awake, not in acute distress, obese Cardiovascular: RRR, S1/S2 +, no rubs, no gallops Respiratory: CTA bilaterally, no wheezing, no rhonchi Abdominal: Soft, NT, ND, bowel sounds + Extremities: no edema, no cyanosis    The results of significant diagnostics from this hospitalization (including imaging, microbiology, ancillary and laboratory) are listed below for reference.     Microbiology: No results found for this or any previous visit (from the past 240 hour(s)).    Labs: BNP (last 3 results) Recent Labs    04/30/21 1423  BNP 163.8*   Basic Metabolic Panel: No results for input(s): NA, K, CL, CO2, GLUCOSE, BUN, CREATININE, CALCIUM, MG, PHOS in the last 168 hours.  Liver Function Tests: No results for input(s): AST, ALT, ALKPHOS, BILITOT, PROT, ALBUMIN in the last 168 hours.  No results for input(s): LIPASE, AMYLASE in the last 168 hours. No results for input(s): AMMONIA in the last 168 hours. CBC: No results for input(s): WBC, NEUTROABS, HGB, HCT, MCV, PLT in the last 168 hours.  Cardiac Enzymes: No results for input(s): CKTOTAL, CKMB,  CKMBINDEX, TROPONINI in the last 168 hours. BNP: Invalid input(s): POCBNP CBG: No results for input(s): GLUCAP in the last 168 hours.  D-Dimer No results for input(s): DDIMER in the last 72 hours. Hgb A1c No results for input(s): HGBA1C in the last 72 hours. Lipid Profile No results for input(s): CHOL, HDL, LDLCALC, TRIG, CHOLHDL, LDLDIRECT in the last 72 hours. Thyroid function studies No  results for input(s): TSH, T4TOTAL, T3FREE, THYROIDAB in the last 72 hours.  Invalid input(s): FREET3 Anemia work up No results for input(s): VITAMINB12, FOLATE, FERRITIN, TIBC, IRON, RETICCTPCT in the last 72 hours. Urinalysis    Component Value Date/Time   COLORURINE YELLOW 10/09/2019 1517   APPEARANCEUR CLEAR 10/09/2019 1517   LABSPEC 1.010 10/09/2019 1517   PHURINE 6.0 10/09/2019 1517   GLUCOSEU NEGATIVE 10/09/2019 1517   HGBUR TRACE (A) 10/09/2019 1517   BILIRUBINUR NEGATIVE 10/09/2019 1517   [place] 10/09/2019 1517   PROTEINUR 30 (A) 10/09/2019 1517   NITRITE NEGATIVE 10/09/2019 1517   LEUKOCYTESUR NEGATIVE 10/09/2019 1517   Sepsis Labs Invalid input(s): PROCALCITONIN,  WBC,  LACTICIDVEN Microbiology No results found for this or any previous visit (from the past 240 hour(s)).    Time coordinating discharge: Over 30 minutes  SIGNED:   Ezekiel Slocumb, DO [facility] 05/26/2021, 8:23 AM   If 7PM-7AM, please contact night-coverage [email or web address]

## 2021-05-12 DIAGNOSIS — J9601 Acute respiratory failure with hypoxia: Secondary | ICD-10-CM | POA: Diagnosis not present

## 2021-05-12 DIAGNOSIS — K5750 Diverticulosis of both small and large intestine without perforation or abscess without bleeding: Secondary | ICD-10-CM | POA: Diagnosis not present

## 2021-05-12 DIAGNOSIS — I2584 Coronary atherosclerosis due to calcified coronary lesion: Secondary | ICD-10-CM | POA: Diagnosis not present

## 2021-05-12 DIAGNOSIS — I5033 Acute on chronic diastolic (congestive) heart failure: Secondary | ICD-10-CM | POA: Diagnosis not present

## 2021-05-12 DIAGNOSIS — E1122 Type 2 diabetes mellitus with diabetic chronic kidney disease: Secondary | ICD-10-CM | POA: Diagnosis not present

## 2021-05-12 DIAGNOSIS — E785 Hyperlipidemia, unspecified: Secondary | ICD-10-CM | POA: Diagnosis not present

## 2021-05-12 DIAGNOSIS — N1832 Chronic kidney disease, stage 3b: Secondary | ICD-10-CM | POA: Diagnosis not present

## 2021-05-12 DIAGNOSIS — I130 Hypertensive heart and chronic kidney disease with heart failure and stage 1 through stage 4 chronic kidney disease, or unspecified chronic kidney disease: Secondary | ICD-10-CM | POA: Diagnosis not present

## 2021-05-12 DIAGNOSIS — I2699 Other pulmonary embolism without acute cor pulmonale: Secondary | ICD-10-CM | POA: Diagnosis not present

## 2021-05-12 DIAGNOSIS — I700 Atherosclerosis of aorta: Secondary | ICD-10-CM | POA: Diagnosis not present

## 2021-05-17 DIAGNOSIS — I2699 Other pulmonary embolism without acute cor pulmonale: Secondary | ICD-10-CM | POA: Diagnosis not present

## 2021-05-17 DIAGNOSIS — R69 Illness, unspecified: Secondary | ICD-10-CM | POA: Diagnosis not present

## 2021-05-17 DIAGNOSIS — I129 Hypertensive chronic kidney disease with stage 1 through stage 4 chronic kidney disease, or unspecified chronic kidney disease: Secondary | ICD-10-CM | POA: Diagnosis not present

## 2021-05-17 DIAGNOSIS — I5030 Unspecified diastolic (congestive) heart failure: Secondary | ICD-10-CM | POA: Diagnosis not present

## 2021-05-17 DIAGNOSIS — R21 Rash and other nonspecific skin eruption: Secondary | ICD-10-CM | POA: Diagnosis not present

## 2021-05-17 DIAGNOSIS — N183 Chronic kidney disease, stage 3 unspecified: Secondary | ICD-10-CM | POA: Diagnosis not present

## 2021-05-19 ENCOUNTER — Other Ambulatory Visit (HOSPITAL_COMMUNITY): Payer: Self-pay

## 2021-05-19 ENCOUNTER — Telehealth (HOSPITAL_COMMUNITY): Payer: Self-pay

## 2021-05-19 DIAGNOSIS — I2699 Other pulmonary embolism without acute cor pulmonale: Secondary | ICD-10-CM | POA: Diagnosis not present

## 2021-05-19 DIAGNOSIS — I2584 Coronary atherosclerosis due to calcified coronary lesion: Secondary | ICD-10-CM | POA: Diagnosis not present

## 2021-05-19 DIAGNOSIS — J9601 Acute respiratory failure with hypoxia: Secondary | ICD-10-CM | POA: Diagnosis not present

## 2021-05-19 DIAGNOSIS — I700 Atherosclerosis of aorta: Secondary | ICD-10-CM | POA: Diagnosis not present

## 2021-05-19 DIAGNOSIS — I130 Hypertensive heart and chronic kidney disease with heart failure and stage 1 through stage 4 chronic kidney disease, or unspecified chronic kidney disease: Secondary | ICD-10-CM | POA: Diagnosis not present

## 2021-05-19 DIAGNOSIS — K5750 Diverticulosis of both small and large intestine without perforation or abscess without bleeding: Secondary | ICD-10-CM | POA: Diagnosis not present

## 2021-05-19 DIAGNOSIS — I5033 Acute on chronic diastolic (congestive) heart failure: Secondary | ICD-10-CM | POA: Diagnosis not present

## 2021-05-19 DIAGNOSIS — E785 Hyperlipidemia, unspecified: Secondary | ICD-10-CM | POA: Diagnosis not present

## 2021-05-19 DIAGNOSIS — E1122 Type 2 diabetes mellitus with diabetic chronic kidney disease: Secondary | ICD-10-CM | POA: Diagnosis not present

## 2021-05-19 DIAGNOSIS — N1832 Chronic kidney disease, stage 3b: Secondary | ICD-10-CM | POA: Diagnosis not present

## 2021-05-19 NOTE — Telephone Encounter (Signed)
Transitions of Care Pharmacy  ° °Call attempted for a pharmacy transitions of care follow-up. HIPAA appropriate voicemail was left with call back information provided.  ° °Call attempt #1. Will follow-up in 2-3 days.  °  °

## 2021-05-20 ENCOUNTER — Telehealth (HOSPITAL_COMMUNITY): Payer: Self-pay | Admitting: Pharmacist

## 2021-05-20 ENCOUNTER — Other Ambulatory Visit (HOSPITAL_COMMUNITY): Payer: Self-pay

## 2021-05-20 NOTE — Telephone Encounter (Signed)
Transitions of Care Pharmacy   Call attempted for a pharmacy transitions of care follow-up. HIPAA appropriate voicemail was left with call back information provided.   Call attempt #2. Will follow-up in 1-3 days.

## 2021-05-21 DIAGNOSIS — E785 Hyperlipidemia, unspecified: Secondary | ICD-10-CM | POA: Diagnosis not present

## 2021-05-21 DIAGNOSIS — I2699 Other pulmonary embolism without acute cor pulmonale: Secondary | ICD-10-CM | POA: Diagnosis not present

## 2021-05-21 DIAGNOSIS — I2584 Coronary atherosclerosis due to calcified coronary lesion: Secondary | ICD-10-CM | POA: Diagnosis not present

## 2021-05-21 DIAGNOSIS — I130 Hypertensive heart and chronic kidney disease with heart failure and stage 1 through stage 4 chronic kidney disease, or unspecified chronic kidney disease: Secondary | ICD-10-CM | POA: Diagnosis not present

## 2021-05-21 DIAGNOSIS — K5750 Diverticulosis of both small and large intestine without perforation or abscess without bleeding: Secondary | ICD-10-CM | POA: Diagnosis not present

## 2021-05-21 DIAGNOSIS — E1122 Type 2 diabetes mellitus with diabetic chronic kidney disease: Secondary | ICD-10-CM | POA: Diagnosis not present

## 2021-05-21 DIAGNOSIS — J9601 Acute respiratory failure with hypoxia: Secondary | ICD-10-CM | POA: Diagnosis not present

## 2021-05-21 DIAGNOSIS — I5033 Acute on chronic diastolic (congestive) heart failure: Secondary | ICD-10-CM | POA: Diagnosis not present

## 2021-05-21 DIAGNOSIS — N1832 Chronic kidney disease, stage 3b: Secondary | ICD-10-CM | POA: Diagnosis not present

## 2021-05-21 DIAGNOSIS — I700 Atherosclerosis of aorta: Secondary | ICD-10-CM | POA: Diagnosis not present

## 2021-06-01 ENCOUNTER — Telehealth (HOSPITAL_COMMUNITY): Payer: Self-pay

## 2021-06-01 NOTE — Telephone Encounter (Signed)
Pharmacy Transitions of Care Follow-up Telephone Call  Date of discharge: 05/06/21  Discharge Diagnosis: Acute PE  How have you been since you were released from the hospital? Patient doing well since discharge, no questions about meds at this time.  Medication changes made at discharge:     START taking: Eliquis (apixaban)  CHANGE:                carvedilol (COREG)  furosemide (Lasix)  STOP taking: predniSONE 20 MG tablet (DELTASONE)   Medication changes verified by the patient? Yes    Medication Accessibility:  Home Pharmacy:  [facility]  Was the patient provided with refills on discharged medications? Refill on carvedilol but not Eliquis   Have all prescriptions been [facility] from [facility] to home pharmacy?  All refills have already been sent in after last follow up.  Is the patient able to afford medications? Has insurance    Medication Review:  APIXABAN (ELIQUIS)  Apixaban 10 mg BID initiated on 05/06/21. Will switch to apixaban 5 mg BID after 7 days (DATE 05/13/21).  - Discussed importance of taking medication around the same time everyday  - Reviewed potential DDIs with patient  - Advised patient of medications to avoid (NSAIDs, ASA)  - Educated that Tylenol (acetaminophen) will be the preferred analgesic to prevent risk of bleeding  - Emphasized importance of monitoring for signs and symptoms of bleeding (abnormal bruising, prolonged bleeding, nose bleeds, bleeding from gums, discolored urine, black tarry stools)  - Advised patient to alert all providers of anticoagulation therapy prior to starting a new medication or having a procedure    Follow-up Appointments:  PCP Hospital f/u appt confirmed? None currently scheduled  Specialist Hospital f/u appt confirmed? Scheduled to see Dr. Silas Flood, pulmonology, on 06/09/21 @ 11:30am.   If their condition worsens, is the pt aware to call PCP or go to the Emergency Dept.? Yes  Final  Patient Assessment: Patient has already had follow up and has refills at home pharmacy

## 2021-06-04 DIAGNOSIS — N1832 Chronic kidney disease, stage 3b: Secondary | ICD-10-CM | POA: Diagnosis not present

## 2021-06-04 DIAGNOSIS — I5033 Acute on chronic diastolic (congestive) heart failure: Secondary | ICD-10-CM | POA: Diagnosis not present

## 2021-06-04 DIAGNOSIS — J9601 Acute respiratory failure with hypoxia: Secondary | ICD-10-CM | POA: Diagnosis not present

## 2021-06-04 DIAGNOSIS — K5750 Diverticulosis of both small and large intestine without perforation or abscess without bleeding: Secondary | ICD-10-CM | POA: Diagnosis not present

## 2021-06-04 DIAGNOSIS — E1122 Type 2 diabetes mellitus with diabetic chronic kidney disease: Secondary | ICD-10-CM | POA: Diagnosis not present

## 2021-06-04 DIAGNOSIS — I2584 Coronary atherosclerosis due to calcified coronary lesion: Secondary | ICD-10-CM | POA: Diagnosis not present

## 2021-06-04 DIAGNOSIS — I700 Atherosclerosis of aorta: Secondary | ICD-10-CM | POA: Diagnosis not present

## 2021-06-04 DIAGNOSIS — I2699 Other pulmonary embolism without acute cor pulmonale: Secondary | ICD-10-CM | POA: Diagnosis not present

## 2021-06-04 DIAGNOSIS — E785 Hyperlipidemia, unspecified: Secondary | ICD-10-CM | POA: Diagnosis not present

## 2021-06-04 DIAGNOSIS — I130 Hypertensive heart and chronic kidney disease with heart failure and stage 1 through stage 4 chronic kidney disease, or unspecified chronic kidney disease: Secondary | ICD-10-CM | POA: Diagnosis not present

## 2021-06-05 DIAGNOSIS — J9601 Acute respiratory failure with hypoxia: Secondary | ICD-10-CM | POA: Diagnosis not present

## 2021-06-09 ENCOUNTER — Encounter: Payer: Self-pay | Admitting: Pulmonary Disease

## 2021-06-09 ENCOUNTER — Ambulatory Visit: Payer: Medicare HMO | Admitting: Pulmonary Disease

## 2021-06-09 ENCOUNTER — Other Ambulatory Visit: Payer: Self-pay

## 2021-06-09 VITALS — BP 134/72 | HR 77 | Ht 62.0 in | Wt 268.0 lb

## 2021-06-09 DIAGNOSIS — I2609 Other pulmonary embolism with acute cor pulmonale: Secondary | ICD-10-CM | POA: Diagnosis not present

## 2021-06-09 MED ORDER — APIXABAN 5 MG PO TABS: 5.0000 mg | ORAL_TABLET | Freq: Two times a day (BID) | ORAL | 4 refills | Status: DC

## 2021-06-09 NOTE — Progress Notes (Signed)
@Patient  ID: Pamela [name], female    DOB: October 10, 1934, 85 y.o.   MRN: [id number]  Chief Complaint  Patient presents with   Hospitalization Follow-up    Feeling better since her hospital visit. Had an acute pulm embolism while in hospital. Cardiac echo done 10/29. CT angio on 10/28    Referring provider: Orpah Melter, MD  HPI:   86 y.o. woman whom we are seeing in hospital follow-up of with felt to be provoked submassive PE in the setting of recent COVID infection with RV changes on echocardiogram.  Discharge summary reviewed.  H&P reviewed.  Patient with lower stable health.  She contracted COVID middle of October 2022.  Couple weeks later she developed worsening shortness of breath.  Went to the ED.  Chest x-ray was clear.  CTA revealed on my review and interpretation large clot burden bilaterally in each segmental pulmonary arteries bilaterally.  Seen initially admitted to the ICU.  Placed on heparin drip.  Monitored.  TTE revealed RV changes on my review.  Oxygen saturations stable and oxygen weaned.  Transferred to the floor.  Transition to apixaban.  Oxygen weaned off prior to discharge.  Overall, she is doing well.  Dyspnea improved.  Has some mobility issues.  Is relatively sedentary.  Gets up every hour and a half to walk on the house, use the bathroom, kitchen etc.  Has chronic low back pain.  Not using oxygen.  They have things at home.  They state her oxygen never dropped below low 90s.   Questionaires / Pulmonary Flowsheets:   ACT:  No flowsheet data found.  MMRC: No flowsheet data found.  Epworth:  No flowsheet data found.  Tests:   FENO:  No results found for: NITRICOXIDE  PFT: No flowsheet data found.  WALK:  No flowsheet data found.  Imaging: Personally reviewed and as per EMR discussion this note  Lab Results: Personally reviewed CBC    Component Value Date/Time   WBC 13.4 (H) 05/06/2021 0203   RBC 4.90 05/06/2021 0203   HGB 15.3 (H)  05/06/2021 0203   HCT 44.8 05/06/2021 0203   PLT 174 05/06/2021 0203   MCV 91.4 05/06/2021 0203   MCH 31.2 05/06/2021 0203   MCHC 34.2 05/06/2021 0203   RDW 13.7 05/06/2021 0203   LYMPHSABS 1.2 05/06/2021 0203   MONOABS 1.3 (H) 05/06/2021 0203   EOSABS 0.2 05/06/2021 0203   BASOSABS 0.0 05/06/2021 0203    BMET    Component Value Date/Time   NA 136 05/06/2021 0203   K 3.7 05/06/2021 0203   CL 99 05/06/2021 0203   CO2 27 05/06/2021 0203   GLUCOSE 106 (H) 05/06/2021 0203   BUN 68 (H) 05/06/2021 0203   CREATININE 1.90 (H) 05/06/2021 0203   CALCIUM 8.8 (L) 05/06/2021 0203   GFRNONAA 26 (L) 05/06/2021 0203   GFRAA 23 (L) 10/11/2019 0514    BNP    Component Value Date/Time   BNP 418.1 (H) 04/30/2021 1423    ProBNP No results found for: PROBNP  Specialty Problems       Pulmonary Problems   Acute respiratory failure with hypoxia (HCC)   Pulmonary edema    Allergies  Allergen Reactions   Levaquin [Levofloxacin In D5w] Nausea And Vomiting    Immunization History  Administered Date(s) Administered   Influenza, High Dose Seasonal PF 04/03/2014, 04/07/2015, 03/31/2016, 03/30/2017, 05/17/2018, 04/11/2019, 04/22/2020, 04/07/2021   PFIZER(Purple Top)SARS-COV-2 Vaccination 07/16/2019, 08/05/2019, 03/10/2020    Past Medical History:  Diagnosis Date  Acute diastolic CHF (congestive heart failure) ([place]) 10/11/2019   Arthritis    Bilateral lower extremity edema    left lower extremity cellulitis   Common bile duct stone 06/22/2017   Diabetes ([place])    Diverticulosis    Duodenal diverticulum, periampullary 11/03/2018   Aborted ERCP 2016   GERD (gastroesophageal reflux disease)    History of hiatal hernia    Hypertension    Kidney disease, chronic, stage III (GFR 30-59 ml/min) (HCC)    Obesity    Osteopenia    Seizures ([place]) 22 yrs ago   no cause found   Tubular adenoma of colon 2009    Tobacco History: Social History   Tobacco Use  Smoking Status Former    Packs/day: 1.00   Years: 20.00   Pack years: 20.00   Types: Cigarettes  Smokeless Tobacco Never  Tobacco Comments   quit smoking 30 yrs ago   Counseling given: Not Answered Tobacco comments: quit smoking 30 yrs ago   Continue to not smoke  Outpatient Encounter Medications as of 06/09/2021  Medication Sig   acetaminophen (TYLENOL) 500 MG tablet Take 1 tablet (500 mg total) by mouth every 6 (six) hours as needed for moderate pain.   apixaban (ELIQUIS) 5 MG TABS tablet Take 1 tablet (5 mg total) by mouth 2 (two) times daily.   calcium carbonate (OSCAL) 1500 (600 Ca) MG TABS tablet Take 1,500 mg by mouth 2 (two) times daily with a meal.   carvedilol (COREG) 12.5 MG tablet Take 1 tablet (12.5 mg total) by mouth 2 (two) times daily with a meal.   dapagliflozin propanediol (FARXIGA) 10 MG TABS tablet Take 10 mg by mouth daily.   furosemide (LASIX) 20 MG tablet Take 1 tablet (20 mg total) by mouth daily as needed for edema (or weight gain).   lip balm (CARMEX) ointment Apply 1 application topically 2 (two) times daily.   LORazepam (ATIVAN) 0.5 MG tablet Take 0.5 mg by mouth every 6 (six) hours as needed for anxiety.   lovastatin (MEVACOR) 20 MG tablet Take 20 mg by mouth at bedtime.   OVER THE COUNTER MEDICATION Take 1 capsule by mouth daily. Daily fiber 100% psyllium.   Probiotic Product (ALIGN) 4 MG CAPS Take 4 mg by mouth daily.   traZODone (DESYREL) 50 MG tablet Take 50 mg by mouth at bedtime as needed for sleep.   [DISCONTINUED] apixaban (ELIQUIS) 5 MG TABS tablet Take 2 tablets (10 mg) by mouth twice daily for 5 days, then decrease to 1 tablet (5 mg) by mouth twice daily. Call your PCP for refills   [DISCONTINUED] albuterol (VENTOLIN HFA) 108 (90 Base) MCG/ACT inhaler Inhale 2 puffs into the lungs in the morning, at noon, in the evening, and at bedtime. For 30 days. Started 04/27/21.   [DISCONTINUED] GUAIATUSSIN AC 100-10 MG/5ML syrup Take 5-10 mLs by mouth every 6 (six) hours as needed  for cough. Take 1 hour before meals for 5 days. Started 04/27/2021.   No facility-administered encounter medications on file as of 06/09/2021.     Review of Systems  Review of Systems  With exertion.  No orthopnea or PND.  Comprehensive review of systems otherwise negative. Physical Exam  BP 134/72 (BP Location: Left Arm, Patient Position: Sitting, Cuff Size: Normal)   Pulse 77   Ht 5\' 2"  (1.575 m)   Wt 268 lb (121.6 kg)   SpO2 99%   BMI 49.02 kg/m   Wt Readings from Last 5 Encounters:  06/09/21 268  lb (121.6 kg)  05/04/21 262 lb 9.1 oz (119.1 kg)  10/09/19 291 lb (132 kg)  11/03/18 280 lb (127 kg)  05/16/17 275 lb (124.7 kg)    BMI Readings from Last 5 Encounters:  06/09/21 49.02 kg/m  05/04/21 46.51 kg/m  10/09/19 51.55 kg/m  11/03/18 51.21 kg/m  05/16/17 50.30 kg/m     Physical Exam General: Well-appearing, in no acute distress Eyes: EOMI, no icterus Neck: Supple, no JVP Pulmonary: Clear, normal work of breathing Cardiovascular: Regular rate and rhythm, no murmur Abdomen: Nondistended, bowel sounds present MSK: No synovitis, no joint effusion Neuro: In wheelchair, no focal deficits Psych: Normal mood, full affect   Assessment & Plan:   Provoked submassive PE: In setting of recent COVID infection late 04/2021.  RV enlargement seen on TTE.  Tolerating anticoagulation well.  Plan for 6 months anticoagulation pending results of repeat TTE 07/2021.  If ongoing RV dysfunction, she does have risk for other potential contributors in addition to possible chronic clot.  Would at least recommend evaluating VQ scan if RV changes persist.  Other potential work-up to be discussed with patient moving forward.  Acute hypoxemic respiratory failure: In the setting of submassive PE.  Not using oxygen at home, weaned off prior to discharge.  Saturations stayed low 90s to high 90s at home.  Recommend checking oxygen saturation throughout the day particular with exertion or  shortly after exertion for the next week.  If she remains above 88% consistently can likely discontinue oxygen therapy in the coming days.  Return in about 2 months (around 08/10/2021).   Lanier Clam, MD 06/09/2021   This appointment required 45 minutes of patient care (this includes precharting, chart review, review of results, face-to-face care, etc.).

## 2021-06-09 NOTE — Patient Instructions (Addendum)
Nice to see you again  I am glad you are feeling better  We will continue the Eliquis 5 mg twice a day for a total of 6 months from the time of your blood clot in late October 2022.  There was some mild stress on the heart on the heart ultrasound or echocardiogram performed in the hospital.  I recommend we repeat this heart ultrasound late January 2023 and to follow-up with me to discuss results.  I am optimistic things will look better.  If there is evidence of ongoing stress, we can discuss neck steps if needed need to be taken.  Return to clinic in 2 months after heart ultrasound with Dr. Silas Flood

## 2021-06-11 ENCOUNTER — Telehealth: Payer: Self-pay | Admitting: Pulmonary Disease

## 2021-06-11 DIAGNOSIS — E7800 Pure hypercholesterolemia, unspecified: Secondary | ICD-10-CM | POA: Diagnosis not present

## 2021-06-11 DIAGNOSIS — M1990 Unspecified osteoarthritis, unspecified site: Secondary | ICD-10-CM | POA: Diagnosis not present

## 2021-06-11 DIAGNOSIS — N2581 Secondary hyperparathyroidism of renal origin: Secondary | ICD-10-CM | POA: Diagnosis not present

## 2021-06-11 DIAGNOSIS — I129 Hypertensive chronic kidney disease with stage 1 through stage 4 chronic kidney disease, or unspecified chronic kidney disease: Secondary | ICD-10-CM | POA: Diagnosis not present

## 2021-06-11 DIAGNOSIS — I509 Heart failure, unspecified: Secondary | ICD-10-CM | POA: Diagnosis not present

## 2021-06-11 DIAGNOSIS — G4700 Insomnia, unspecified: Secondary | ICD-10-CM | POA: Diagnosis not present

## 2021-06-11 DIAGNOSIS — E1122 Type 2 diabetes mellitus with diabetic chronic kidney disease: Secondary | ICD-10-CM | POA: Diagnosis not present

## 2021-06-11 DIAGNOSIS — R54 Age-related physical debility: Secondary | ICD-10-CM | POA: Diagnosis not present

## 2021-06-11 NOTE — Telephone Encounter (Signed)
Spoke with the pt She says [facility] did not get our rx for the Eliquis  I called the pharm and was advised they did get the rx but it can not be filled until tomorrow since it was too early  Pt aware of this  Nothing further needed

## 2021-06-22 DIAGNOSIS — E7800 Pure hypercholesterolemia, unspecified: Secondary | ICD-10-CM | POA: Diagnosis not present

## 2021-06-22 DIAGNOSIS — N2581 Secondary hyperparathyroidism of renal origin: Secondary | ICD-10-CM | POA: Diagnosis not present

## 2021-06-22 DIAGNOSIS — R69 Illness, unspecified: Secondary | ICD-10-CM | POA: Diagnosis not present

## 2021-06-22 DIAGNOSIS — E1122 Type 2 diabetes mellitus with diabetic chronic kidney disease: Secondary | ICD-10-CM | POA: Diagnosis not present

## 2021-06-22 DIAGNOSIS — M1990 Unspecified osteoarthritis, unspecified site: Secondary | ICD-10-CM | POA: Diagnosis not present

## 2021-06-22 DIAGNOSIS — I509 Heart failure, unspecified: Secondary | ICD-10-CM | POA: Diagnosis not present

## 2021-06-22 DIAGNOSIS — Z0000 Encounter for general adult medical examination without abnormal findings: Secondary | ICD-10-CM | POA: Diagnosis not present

## 2021-06-22 DIAGNOSIS — I129 Hypertensive chronic kidney disease with stage 1 through stage 4 chronic kidney disease, or unspecified chronic kidney disease: Secondary | ICD-10-CM | POA: Diagnosis not present

## 2021-06-22 DIAGNOSIS — N1832 Chronic kidney disease, stage 3b: Secondary | ICD-10-CM | POA: Diagnosis not present

## 2021-06-22 DIAGNOSIS — I700 Atherosclerosis of aorta: Secondary | ICD-10-CM | POA: Diagnosis not present

## 2021-08-02 ENCOUNTER — Ambulatory Visit (INDEPENDENT_AMBULATORY_CARE_PROVIDER_SITE_OTHER): Payer: Medicare HMO

## 2021-08-02 ENCOUNTER — Other Ambulatory Visit: Payer: Self-pay

## 2021-08-02 ENCOUNTER — Ambulatory Visit: Payer: Medicare HMO | Attending: Internal Medicine

## 2021-08-02 ENCOUNTER — Other Ambulatory Visit (HOSPITAL_BASED_OUTPATIENT_CLINIC_OR_DEPARTMENT_OTHER): Payer: Self-pay

## 2021-08-02 DIAGNOSIS — I2609 Other pulmonary embolism with acute cor pulmonale: Secondary | ICD-10-CM | POA: Diagnosis not present

## 2021-08-02 DIAGNOSIS — Z23 Encounter for immunization: Secondary | ICD-10-CM

## 2021-08-02 LAB — ECHOCARDIOGRAM LIMITED
AR max vel: 2.2 cm2
AV Area VTI: 2.33 cm2
AV Area mean vel: 2.41 cm2
AV Mean grad: 3 mmHg
AV Peak grad: 7.4 mmHg
Ao pk vel: 1.36 m/s
Area-P 1/2: 6.9 cm2
S' Lateral: 3.11 cm
Single Plane A4C EF: 46.3 %

## 2021-08-02 MED ORDER — PFIZER COVID-19 VAC BIVALENT 30 MCG/0.3ML IM SUSP
INTRAMUSCULAR | 0 refills | Status: DC
  Filled 2021-08-02: qty 0.3, 1d supply, fill #0

## 2021-08-02 NOTE — Progress Notes (Signed)
° °  Covid-19 Vaccination Clinic  Name:  Pamela [name]    MRN: [id number] DOB: 1934-12-15  08/02/2021  Pamela [name] was observed post Covid-19 immunization for 15 minutes without incident. She was provided with Vaccine Information Sheet and instruction to access the V-Safe system.   Pamela [name] was instructed to call 911 with any severe reactions post vaccine: Difficulty breathing  Swelling of face and throat  A fast heartbeat  A bad rash all over body  Dizziness and weakness   Immunizations Administered     Name Date Dose VIS Date Route   Pfizer Covid-19 Vaccine Bivalent Booster 08/02/2021  2:40 PM 0.3 mL 03/03/2021 Intramuscular   Manufacturer: [place]   Lot: [id number]   [place]: [phone]

## 2021-08-02 NOTE — Progress Notes (Signed)
TTE shows improved RV function consistent with improved stress after submassive PE. This good news.   Now it shows area of main pumping function to whole body is decreased. Please assess if having worsening shortness of breath or chest pain. If so, recommend referral to ED. If overall things stable and unchanged, recommend urgent referral to cardiology for new CHF, focal wall motion abnormality.

## 2021-08-04 ENCOUNTER — Telehealth: Payer: Self-pay | Admitting: Pulmonary Disease

## 2021-08-04 NOTE — Telephone Encounter (Signed)
Pt states she received a letter that states we have been trying to contact her regarding results of her scan. There is no documentation that we have been trying to reach her. Pt did have echocardiogram on Monday and has appt on 2/8 with MH to go over those results. Please advise.

## 2021-08-04 NOTE — Telephone Encounter (Signed)
ATC patient--unable to leave vm due to mailbox not being setup.  °Will call back.  ° °

## 2021-08-11 ENCOUNTER — Other Ambulatory Visit: Payer: Self-pay

## 2021-08-11 ENCOUNTER — Ambulatory Visit: Payer: Medicare HMO | Admitting: Pulmonary Disease

## 2021-08-11 ENCOUNTER — Encounter: Payer: Self-pay | Admitting: Pulmonary Disease

## 2021-08-11 VITALS — BP 124/68 | HR 68 | Temp 98.5°F | Ht 62.0 in | Wt 260.0 lb

## 2021-08-11 DIAGNOSIS — R052 Subacute cough: Secondary | ICD-10-CM

## 2021-08-11 DIAGNOSIS — I429 Cardiomyopathy, unspecified: Secondary | ICD-10-CM

## 2021-08-11 DIAGNOSIS — I2609 Other pulmonary embolism with acute cor pulmonale: Secondary | ICD-10-CM

## 2021-08-11 MED ORDER — FLUTICASONE PROPIONATE 50 MCG/ACT NA SUSP: 1.0000 | Freq: Every day | NASAL | 3 refills | Status: DC

## 2021-08-11 NOTE — Progress Notes (Signed)
@Patient  ID: Pamela [name], female    DOB: [birth year], 87 y.o.   MRN: [id number]  Chief Complaint  Patient presents with   Follow-up    Follow up after her cardiac echo. Had her echo done almost 2 weeks ago on the 30th.     Referring provider: Orpah Melter, MD  HPI:   87 y.o. woman whom we are seeing in follow-up of provoked submassive PE in the setting of recent COVID infection with RV changes on echocardiogram.    Still doing well overall.  Had ultrasound couple weeks ago, echocardiogram to evaluate right heart strain seen on echocardiogram at time of admission.  We reviewed the results at length.  This shows improved right-sided dysfunction which is reassuring for  well treated PE. This did show new development of reduced EF 45 to 50%, new left atrial dilation when compared to 05/01/2021 on my review.  HPI at initial visit: Patient with overall stable health.  She contracted COVID middle of October 2022.  Couple weeks later she developed worsening shortness of breath.  Went to the ED.  Chest x-ray was clear.  CTA revealed on my review and interpretation large clot burden bilaterally in each segmental pulmonary arteries bilaterally.  Seen initially admitted to the ICU.  Placed on heparin drip.  Monitored.  TTE revealed RV changes on my review.  Oxygen saturations stable and oxygen weaned.  Transferred to the floor.  Transition to apixaban.  Oxygen weaned off prior to discharge.  Overall, she is doing well.  Dyspnea improved.  Has some mobility issues.  Is relatively sedentary.  Gets up every hour and a half to walk on the house, use the bathroom, kitchen etc.  Has chronic low back pain.  Not using oxygen.  They have things at home.  They state her oxygen never dropped below low 90s.   Questionaires / Pulmonary Flowsheets:   ACT:  No flowsheet data found.  MMRC: No flowsheet data found.  Epworth:  No flowsheet data found.  Tests:   FENO:  No results found for:  NITRICOXIDE  PFT: No flowsheet data found.  WALK:  No flowsheet data found.  Imaging: Personally reviewed and as per EMR discussion this note  Lab Results: Personally reviewed CBC    Component Value Date/Time   WBC 13.4 (H) 05/06/2021 0203   RBC 4.90 05/06/2021 0203   HGB 15.3 (H) 05/06/2021 0203   HCT 44.8 05/06/2021 0203   PLT 174 05/06/2021 0203   MCV 91.4 05/06/2021 0203   MCH 31.2 05/06/2021 0203   MCHC 34.2 05/06/2021 0203   RDW 13.7 05/06/2021 0203   LYMPHSABS 1.2 05/06/2021 0203   MONOABS 1.3 (H) 05/06/2021 0203   EOSABS 0.2 05/06/2021 0203   BASOSABS 0.0 05/06/2021 0203    BMET    Component Value Date/Time   NA 136 05/06/2021 0203   K 3.7 05/06/2021 0203   CL 99 05/06/2021 0203   CO2 27 05/06/2021 0203   GLUCOSE 106 (H) 05/06/2021 0203   BUN 68 (H) 05/06/2021 0203   CREATININE 1.90 (H) 05/06/2021 0203   CALCIUM 8.8 (L) 05/06/2021 0203   GFRNONAA 26 (L) 05/06/2021 0203   GFRAA 23 (L) 10/11/2019 0514    BNP    Component Value Date/Time   BNP 418.1 (H) 04/30/2021 1423    ProBNP No results found for: PROBNP  Specialty Problems       Pulmonary Problems   Acute respiratory failure with hypoxia (HCC)   Pulmonary edema  Allergies  Allergen Reactions   Levaquin [Levofloxacin In D5w] Nausea And Vomiting    Immunization History  Administered Date(s) Administered   Influenza, High Dose Seasonal PF 04/03/2014, 04/07/2015, 03/31/2016, 03/30/2017, 05/17/2018, 04/11/2019, 04/22/2020, 04/07/2021   PFIZER(Purple Top)SARS-COV-2 Vaccination 07/16/2019, 08/05/2019, 03/10/2020   Pfizer Covid-19 Vaccine Bivalent Booster 40yrs & up 08/02/2021    Past Medical History:  Diagnosis Date   Acute diastolic CHF (congestive heart failure) ([place]) 10/11/2019   Arthritis    Bilateral lower extremity edema    left lower extremity cellulitis   Common bile duct stone 06/22/2017   Diabetes ([place])    Diverticulosis    Duodenal diverticulum, periampullary 11/03/2018    Aborted ERCP 2016   GERD (gastroesophageal reflux disease)    History of hiatal hernia    Hypertension    Kidney disease, chronic, stage III (GFR 30-59 ml/min) (HCC)    Obesity    Osteopenia    Seizures ([place]) 22 yrs ago   no cause found   Tubular adenoma of colon 2009    Tobacco History: Social History   Tobacco Use  Smoking Status Former   Packs/day: 1.00   Years: 20.00   Pack years: 20.00   Types: Cigarettes  Smokeless Tobacco Never  Tobacco Comments   quit smoking 30 yrs ago   Counseling given: Not Answered Tobacco comments: quit smoking 30 yrs ago    Continue to not smoke  Outpatient Encounter Medications as of 08/11/2021  Medication Sig   acetaminophen (TYLENOL) 500 MG tablet Take 1 tablet (500 mg total) by mouth every 6 (six) hours as needed for moderate pain.   apixaban (ELIQUIS) 5 MG TABS tablet Take 1 tablet (5 mg total) by mouth 2 (two) times daily.   calcium carbonate (OSCAL) 1500 (600 Ca) MG TABS tablet Take 1,500 mg by mouth 2 (two) times daily with a meal.   Calcium Polycarbophil (FIBER) 625 MG TABS 2 tablets as needed   carvedilol (COREG) 12.5 MG tablet Take 1 tablet (12.5 mg total) by mouth 2 (two) times daily with a meal.   dapagliflozin propanediol (FARXIGA) 10 MG TABS tablet Take 10 mg by mouth daily.   dapagliflozin propanediol (FARXIGA) 10 MG TABS tablet Take by mouth daily.   fluticasone (FLONASE) 50 MCG/ACT nasal spray Place 1 spray into both nostrils daily.   furosemide (LASIX) 20 MG tablet Take 1 tablet (20 mg total) by mouth daily as needed for edema (or weight gain).   Lancets (ONETOUCH DELICA PLUS [id number]) MISC    lovastatin (MEVACOR) 20 MG tablet Take 20 mg by mouth at bedtime.   OVER THE COUNTER MEDICATION Take 1 capsule by mouth daily. Daily fiber 100% psyllium.   Probiotic Product (ALIGN) 4 MG CAPS Take 4 mg by mouth daily.   traZODone (DESYREL) 50 MG tablet Take 50 mg by mouth at bedtime as needed for sleep.   [DISCONTINUED] COVID-19  mRNA bivalent vaccine, Pfizer, (PFIZER COVID-19 VAC BIVALENT) injection Inject into the muscle.   [DISCONTINUED] lip balm (CARMEX) ointment Apply 1 application topically 2 (two) times daily.   [DISCONTINUED] LORazepam (ATIVAN) 0.5 MG tablet Take 0.5 mg by mouth every 6 (six) hours as needed for anxiety.   No facility-administered encounter medications on file as of 08/11/2021.     Review of Systems  Review of Systems  N/a Physical Exam  BP 124/68 (BP Location: Right Arm, Patient Position: Sitting, Cuff Size: Normal)    Pulse 68    Temp 98.5 F (36.9 C) (Oral)  Ht 5\' 2"  (1.575 m)    Wt 260 lb (117.9 kg)    SpO2 96%    BMI 47.55 kg/m   Wt Readings from Last 5 Encounters:  08/11/21 260 lb (117.9 kg)  06/09/21 268 lb (121.6 kg)  05/04/21 262 lb 9.1 oz (119.1 kg)  10/09/19 291 lb (132 kg)  11/03/18 280 lb (127 kg)    BMI Readings from Last 5 Encounters:  08/11/21 47.55 kg/m  06/09/21 49.02 kg/m  05/04/21 46.51 kg/m  10/09/19 51.55 kg/m  11/03/18 51.21 kg/m     Physical Exam General: Well-appearing, in no acute distress Eyes: EOMI, no icterus Neck: Supple, no JVP Pulmonary: Clear, normal work of breathing Cardiovascular: Regular rate and rhythm, no murmur Abdomen: Nondistended, bowel sounds present MSK: No synovitis, no joint effusion Neuro: In wheelchair, no focal deficits Psych: Normal mood, full affect   Assessment & Plan:   Provoked submassive PE: In setting of recent COVID infection late 04/2021.  RV enlargement seen on TTE.  Tolerating anticoagulation well.  Repeat TTE 07/2021 shows resolution of RV strain and enlargement.  Plan for 6 months anticoagulation to in late April 2023.   New reduced ejection fraction: EF 45 to 50% on TTE 07/2021, new with new left atrial dilation compared to prior.  She denies any worsening dyspnea.  No weight gain, swelling, orthopnea or PND.  Offered cardiology referral which she declined at this time.  Counseled signs and symptoms  of worsening heart failure and to contact us at any time if this were to occur.  Cough: Present last month or so.  Associated nasal congestion.  Mucinex helps.  Add Flonase 1 spray twice a day for 7 days then 1 spray daily.  Return in about 3 months (around 11/08/2021).   Lanier Clam, MD 08/11/2021

## 2021-08-11 NOTE — Patient Instructions (Addendum)
Neck to see you again  We can stop the Eliquis on October 29, 2021.  The heart ultrasound showed that the blood thinner in your body has done well and getting rid of the blood clot as the stress on the heart we saw in the hospital has gotten better on her heart ultrasound a couple weeks ago.  If you notice leg swelling, abdominal bloating, shortness of breath, difficulty breathing when lying flat or waking up in the middle the night short of breath, please let us know.  These would be signs that you need to see a heart doctor.  The heart ultrasound did show slight decrease in the pumping function of the heart compared to October 2022.  For the cough, use Flonase 1 spray each nostril twice a day for 7 days, then 1 spray each nostril once a day thereafter.  Continue the Mucinex.  Return to clinic in 3 months or sooner as needed with Dr. Silas Flood

## 2021-08-24 DIAGNOSIS — M546 Pain in thoracic spine: Secondary | ICD-10-CM | POA: Diagnosis not present

## 2021-08-25 DIAGNOSIS — N183 Chronic kidney disease, stage 3 unspecified: Secondary | ICD-10-CM | POA: Diagnosis not present

## 2021-08-25 DIAGNOSIS — N189 Chronic kidney disease, unspecified: Secondary | ICD-10-CM | POA: Diagnosis not present

## 2021-08-26 NOTE — Telephone Encounter (Signed)
Pt seen on 08/11/21 by Dr. Silas Flood. Will close encounter.

## 2021-08-30 DIAGNOSIS — I509 Heart failure, unspecified: Secondary | ICD-10-CM | POA: Diagnosis not present

## 2021-08-30 DIAGNOSIS — I129 Hypertensive chronic kidney disease with stage 1 through stage 4 chronic kidney disease, or unspecified chronic kidney disease: Secondary | ICD-10-CM | POA: Diagnosis not present

## 2021-08-30 DIAGNOSIS — E7800 Pure hypercholesterolemia, unspecified: Secondary | ICD-10-CM | POA: Diagnosis not present

## 2021-08-30 DIAGNOSIS — E1122 Type 2 diabetes mellitus with diabetic chronic kidney disease: Secondary | ICD-10-CM | POA: Diagnosis not present

## 2021-09-01 DIAGNOSIS — I129 Hypertensive chronic kidney disease with stage 1 through stage 4 chronic kidney disease, or unspecified chronic kidney disease: Secondary | ICD-10-CM | POA: Diagnosis not present

## 2021-09-01 DIAGNOSIS — D631 Anemia in chronic kidney disease: Secondary | ICD-10-CM | POA: Diagnosis not present

## 2021-09-01 DIAGNOSIS — E1122 Type 2 diabetes mellitus with diabetic chronic kidney disease: Secondary | ICD-10-CM | POA: Diagnosis not present

## 2021-09-01 DIAGNOSIS — N183 Chronic kidney disease, stage 3 unspecified: Secondary | ICD-10-CM | POA: Diagnosis not present

## 2021-09-01 DIAGNOSIS — N2581 Secondary hyperparathyroidism of renal origin: Secondary | ICD-10-CM | POA: Diagnosis not present

## 2021-09-27 DIAGNOSIS — M25562 Pain in left knee: Secondary | ICD-10-CM | POA: Diagnosis not present

## 2021-09-27 DIAGNOSIS — M1711 Unilateral primary osteoarthritis, right knee: Secondary | ICD-10-CM | POA: Diagnosis not present

## 2021-10-29 ENCOUNTER — Telehealth: Payer: Self-pay | Admitting: Pulmonary Disease

## 2021-10-29 NOTE — Telephone Encounter (Signed)
Called husband Thayer Jew back to inform his that pt is to stop Eliquis at the end of this month 10/31/2021. Thayer Jew verbalized understanding. Reminded him of the follow up on 11/16/21. Nothing further needed  ?

## 2021-10-29 NOTE — Telephone Encounter (Signed)
Ok to stop Eliquis at the end of this month, April 2023.

## 2021-10-29 NOTE — Telephone Encounter (Signed)
Called patient and advised him that I would talk to Dr Silas Flood about when Sophira needs to stop the Eliquis.  ? ?AVS states beginning of May and Thayer Jew wants to confirm. ? ?[place] please advise!  ?

## 2021-11-16 ENCOUNTER — Ambulatory Visit: Payer: Medicare HMO | Admitting: Pulmonary Disease

## 2021-11-16 ENCOUNTER — Encounter: Payer: Self-pay | Admitting: Pulmonary Disease

## 2021-11-16 VITALS — BP 138/68 | HR 73 | Temp 98.2°F | Ht 62.0 in | Wt 260.0 lb

## 2021-11-16 DIAGNOSIS — R053 Chronic cough: Secondary | ICD-10-CM

## 2021-11-16 DIAGNOSIS — I2609 Other pulmonary embolism with acute cor pulmonale: Secondary | ICD-10-CM | POA: Diagnosis not present

## 2021-11-16 NOTE — Progress Notes (Signed)
? ?'@Patient'$  ID: Pamela [name], female    DOB: 02-02-1935, 87 y.o.   MRN: [id number] ? ?Chief Complaint  ?Patient presents with  ? Follow-up  ?  Pt is doing well today no issues noted. She is finally off of her Eliquis and stopped the end of April.   ? ? ?Referring provider: ?Orpah Melter, MD ? ?HPI:  ? ?87 y.o. woman whom we are seeing in follow-up of provoked submassive PE in the setting of recent COVID infection with RV changes on echocardiogram.   ? ?Still doing well overall.  Once again reviewed repeat ultrasound 3 months after PE that showed resolution of RV dysfunction.  Overall doing well.  Dyspnea stable.  Gradually and overall improved from prior.  Stopped Eliquis a few weeks ago at the 85-monthmark following pulmonary embolus.  No concerns.  Still with intermittent cough.  Sometimes productive.  Scratchy throat.  Use Flonase spray as prescribed in the past.  Not sure was very helpful.  Is using intermittently with bad days, nasal congestion.  Has been using Mucinex DM with improved symptoms of cough. ? ?HPI at initial visit: ?Patient with overall stable health.  She contracted COVID middle of October 2022.  Couple weeks later she developed worsening shortness of breath.  Went to the ED.  Chest x-ray was clear.  CTA revealed on my review and interpretation large clot burden bilaterally in each segmental pulmonary arteries bilaterally.  Seen initially admitted to the ICU.  Placed on heparin drip.  Monitored.  TTE revealed RV changes on my review.  Oxygen saturations stable and oxygen weaned.  Transferred to the floor.  Transition to apixaban.  Oxygen weaned off prior to discharge. ? ?Overall, she is doing well.  Dyspnea improved.  Has some mobility issues.  Is relatively sedentary.  Gets up every hour and a half to walk on the house, use the bathroom, kitchen etc.  Has chronic low back pain.  Not using oxygen.  They have things at home.  They state her oxygen never dropped below low 90s. ? ? ?Questionaires  / Pulmonary Flowsheets:  ? ?ACT:  ?   ? View : No data to display.  ?  ?  ?  ? ? ?MMRC: ?   ? View : No data to display.  ?  ?  ?  ? ? ?Epworth:  ?   ? View : No data to display.  ?  ?  ?  ? ? ?Tests:  ? ?FENO:  ?No results found for: NITRICOXIDE ? ?PFT: ?   ? View : No data to display.  ?  ?  ?  ? ? ?WALK:  ?   ? View : No data to display.  ?  ?  ?  ? ? ?Imaging: ?Personally reviewed and as per EMR discussion this note ? ?Lab Results: ?Personally reviewed ?CBC ?   ?Component Value Date/Time  ? WBC 13.4 (H) 05/06/2021 0203  ? RBC 4.90 05/06/2021 0203  ? HGB 15.3 (H) 05/06/2021 0203  ? HCT 44.8 05/06/2021 0203  ? PLT 174 05/06/2021 0203  ? MCV 91.4 05/06/2021 0203  ? MCH 31.2 05/06/2021 0203  ? MCHC 34.2 05/06/2021 0203  ? RDW 13.7 05/06/2021 0203  ? LYMPHSABS 1.2 05/06/2021 0203  ? MONOABS 1.3 (H) 05/06/2021 0203  ? EOSABS 0.2 05/06/2021 0203  ? BASOSABS 0.0 05/06/2021 0203  ? ? ?BMET ?   ?Component Value Date/Time  ? NA 136 05/06/2021 0203  ? K 3.7 05/06/2021  0203  ? CL 99 05/06/2021 0203  ? CO2 27 05/06/2021 0203  ? GLUCOSE 106 (H) 05/06/2021 0203  ? BUN 68 (H) 05/06/2021 0203  ? CREATININE 1.90 (H) 05/06/2021 0203  ? CALCIUM 8.8 (L) 05/06/2021 0203  ? GFRNONAA 26 (L) 05/06/2021 0203  ? GFRAA 23 (L) 10/11/2019 0514  ? ? ?BNP ?   ?Component Value Date/Time  ? BNP 418.1 (H) 04/30/2021 1423  ? ? ?ProBNP ?No results found for: PROBNP ? ?Specialty Problems   ? ?  ? Pulmonary Problems  ? Acute respiratory failure with hypoxia ([place])  ? Pulmonary edema  ? ? ?Allergies  ?Allergen Reactions  ? Levaquin [Levofloxacin In D5w] Nausea And Vomiting  ? Lisinopril Cough  ? ? ?Immunization History  ?Administered Date(s) Administered  ? Influenza, High Dose Seasonal PF 04/03/2014, 04/07/2015, 03/31/2016, 03/30/2017, 05/17/2018, 04/11/2019, 04/22/2020, 04/07/2021  ? PFIZER(Purple Top)SARS-COV-2 Vaccination 07/16/2019, 08/05/2019, 03/10/2020  ? Pension scheme manager 73yr & up 08/02/2021  ? ? ?Past Medical History:   ?Diagnosis Date  ? Acute diastolic CHF (congestive heart failure) ([facility] 10/11/2019  ? Arthritis   ? Bilateral lower extremity edema   ? left lower extremity cellulitis  ? Common bile duct stone 06/22/2017  ? Diabetes ([facility]   ? Diverticulosis   ? Duodenal diverticulum, periampullary 11/03/2018  ? Aborted ERCP 2016  ? GERD (gastroesophageal reflux disease)   ? History of hiatal hernia   ? Hypertension   ? Kidney disease, chronic, stage III (GFR 30-59 ml/min) (HCC)   ? Obesity   ? Osteopenia   ? Seizures ([facility] 22 yrs ago  ? no cause found  ? Tubular adenoma of colon 2009  ? ? ?Tobacco History: ?Social History  ? ?Tobacco Use  ?Smoking Status Former  ? Packs/day: 1.00  ? Years: 20.00  ? Pack years: 20.00  ? Types: Cigarettes  ?Smokeless Tobacco Never  ?Tobacco Comments  ? quit smoking 30 yrs ago  ? ?Counseling given: Not Answered ?Tobacco comments: quit smoking 30 yrs ago ? ? ? ?Continue to not smoke ? ?Outpatient Encounter Medications as of 11/16/2021  ?Medication Sig  ? acetaminophen (TYLENOL) 500 MG tablet Take 1 tablet (500 mg total) by mouth every 6 (six) hours as needed for moderate pain.  ? calcium carbonate (OSCAL) 1500 (600 Ca) MG TABS tablet Take 1,500 mg by mouth 2 (two) times daily with a meal.  ? Calcium Polycarbophil (FIBER) 625 MG TABS 2 tablets as needed  ? dapagliflozin propanediol (FARXIGA) 10 MG TABS tablet Take 10 mg by mouth daily.  ? fluticasone (FLONASE) 50 MCG/ACT nasal spray Place 1 spray into both nostrils daily.  ? furosemide (LASIX) 20 MG tablet Take 1 tablet (20 mg total) by mouth daily as needed for edema (or weight gain).  ? Lancets (ONETOUCH DELICA PLUS [id number] MISC   ? lovastatin (MEVACOR) 20 MG tablet Take 20 mg by mouth at bedtime.  ? OVER THE COUNTER MEDICATION Take 1 capsule by mouth daily. Daily fiber 100% psyllium.  ? Probiotic Product (ALIGN) 4 MG CAPS Take 4 mg by mouth daily.  ? traZODone (DESYREL) 50 MG tablet Take 50 mg by mouth at bedtime as needed for sleep.  ? metoprolol  succinate (TOPROL-XL) 25 MG 24 hr tablet Take 25 mg by mouth daily.  ? [DISCONTINUED] apixaban (ELIQUIS) 5 MG TABS tablet Take 1 tablet (5 mg total) by mouth 2 (two) times daily.  ? [DISCONTINUED] carvedilol (COREG) 12.5 MG tablet Take 1 tablet (12.5 mg total) by  mouth 2 (two) times daily with a meal.  ? [DISCONTINUED] dapagliflozin propanediol (FARXIGA) 10 MG TABS tablet Take by mouth daily.  ? ?No facility-administered encounter medications on file as of 11/16/2021.  ? ? ? ?Review of Systems ? ?Review of Systems  ?N/a ?Physical Exam ? ?BP 138/68 (BP Location: Right Arm, Patient Position: Sitting, Cuff Size: Normal)   Pulse 73   Temp 98.2 ?F (36.8 ?C) (Oral)   Ht '5\' 2"'$  (1.575 m)   Wt 260 lb (117.9 kg)   SpO2 94%   BMI 47.55 kg/m?  ? ?Wt Readings from Last 5 Encounters:  ?11/16/21 260 lb (117.9 kg)  ?08/11/21 260 lb (117.9 kg)  ?06/09/21 268 lb (121.6 kg)  ?05/04/21 262 lb 9.1 oz (119.1 kg)  ?10/09/19 291 lb (132 kg)  ? ? ?BMI Readings from Last 5 Encounters:  ?11/16/21 47.55 kg/m?  ?08/11/21 47.55 kg/m?  ?06/09/21 49.02 kg/m?  ?05/04/21 46.51 kg/m?  ?10/09/19 51.55 kg/m?  ? ? ? ?Physical Exam ?General: Well-appearing, in no acute distress ?Eyes: EOMI, no icterus ?Neck: Supple, no JVP ?Pulmonary: Clear, normal work of breathing ?Cardiovascular: Regular rate and rhythm, no murmur ?Abdomen: Nondistended, bowel sounds present ?MSK: No synovitis, no joint effusion ?Neuro: In wheelchair, no focal deficits ?Psych: Normal mood, full affect ? ? ?Assessment & Plan:  ? ?Provoked submassive PE: In setting of recent COVID infection late 04/2021.  RV enlargement seen on TTE.  Tolerating anticoagulation well.  Repeat TTE 07/2021 shows resolution of RV strain and enlargement.  Completed 6 months of anticoagulation, ending the end of April 2023..  ? ?New reduced ejection fraction: EF 45 to 50% on TTE 07/2021, new with new left atrial dilation compared to prior.  She denies any worsening dyspnea.  No weight gain, swelling,  orthopnea or PND.  Offered cardiology referral which she has declined.  Counseled signs and symptoms of worsening heart failure and to contact us at any time if this were to occur. ? ?Cough: Present last month or s

## 2021-11-16 NOTE — Patient Instructions (Signed)
Nice to see you again ? ?No need for scheduled follow-up, follow-up as needed. Happy to  ?see you at any time with any questions or concerns. ?

## 2021-11-22 DIAGNOSIS — E7800 Pure hypercholesterolemia, unspecified: Secondary | ICD-10-CM | POA: Diagnosis not present

## 2021-11-22 DIAGNOSIS — N2581 Secondary hyperparathyroidism of renal origin: Secondary | ICD-10-CM | POA: Diagnosis not present

## 2021-11-22 DIAGNOSIS — R32 Unspecified urinary incontinence: Secondary | ICD-10-CM | POA: Diagnosis not present

## 2021-11-22 DIAGNOSIS — N1832 Chronic kidney disease, stage 3b: Secondary | ICD-10-CM | POA: Diagnosis not present

## 2021-11-22 DIAGNOSIS — I700 Atherosclerosis of aorta: Secondary | ICD-10-CM | POA: Diagnosis not present

## 2021-11-22 DIAGNOSIS — I509 Heart failure, unspecified: Secondary | ICD-10-CM | POA: Diagnosis not present

## 2021-11-22 DIAGNOSIS — E1122 Type 2 diabetes mellitus with diabetic chronic kidney disease: Secondary | ICD-10-CM | POA: Diagnosis not present

## 2021-11-22 DIAGNOSIS — I129 Hypertensive chronic kidney disease with stage 1 through stage 4 chronic kidney disease, or unspecified chronic kidney disease: Secondary | ICD-10-CM | POA: Diagnosis not present

## 2021-11-22 DIAGNOSIS — R69 Illness, unspecified: Secondary | ICD-10-CM | POA: Diagnosis not present

## 2021-11-22 DIAGNOSIS — M1990 Unspecified osteoarthritis, unspecified site: Secondary | ICD-10-CM | POA: Diagnosis not present

## 2021-11-22 DIAGNOSIS — G4700 Insomnia, unspecified: Secondary | ICD-10-CM | POA: Diagnosis not present

## 2021-12-28 ENCOUNTER — Other Ambulatory Visit: Payer: Self-pay

## 2021-12-28 ENCOUNTER — Emergency Department (HOSPITAL_BASED_OUTPATIENT_CLINIC_OR_DEPARTMENT_OTHER): Payer: Medicare HMO

## 2021-12-28 ENCOUNTER — Inpatient Hospital Stay (HOSPITAL_BASED_OUTPATIENT_CLINIC_OR_DEPARTMENT_OTHER)
Admission: EM | Admit: 2021-12-28 | Discharge: 2022-01-03 | DRG: 291 | Disposition: A | Discharge status: Home Health | Payer: Medicare HMO | Attending: Internal Medicine | Admitting: Internal Medicine

## 2021-12-28 ENCOUNTER — Encounter (HOSPITAL_BASED_OUTPATIENT_CLINIC_OR_DEPARTMENT_OTHER): Payer: Self-pay

## 2021-12-28 DIAGNOSIS — M25562 Pain in left knee: Secondary | ICD-10-CM

## 2021-12-28 DIAGNOSIS — I161 Hypertensive emergency: Secondary | ICD-10-CM

## 2021-12-28 DIAGNOSIS — I480 Paroxysmal atrial fibrillation: Secondary | ICD-10-CM | POA: Diagnosis not present

## 2021-12-28 DIAGNOSIS — K219 Gastro-esophageal reflux disease without esophagitis: Secondary | ICD-10-CM | POA: Diagnosis not present

## 2021-12-28 DIAGNOSIS — Z79891 Long term (current) use of opiate analgesic: Secondary | ICD-10-CM | POA: Diagnosis not present

## 2021-12-28 DIAGNOSIS — J9601 Acute respiratory failure with hypoxia: Secondary | ICD-10-CM | POA: Diagnosis present

## 2021-12-28 DIAGNOSIS — I130 Hypertensive heart and chronic kidney disease with heart failure and stage 1 through stage 4 chronic kidney disease, or unspecified chronic kidney disease: Secondary | ICD-10-CM | POA: Diagnosis not present

## 2021-12-28 DIAGNOSIS — E1122 Type 2 diabetes mellitus with diabetic chronic kidney disease: Secondary | ICD-10-CM | POA: Diagnosis present

## 2021-12-28 DIAGNOSIS — I10 Essential (primary) hypertension: Secondary | ICD-10-CM | POA: Diagnosis not present

## 2021-12-28 DIAGNOSIS — Z9049 Acquired absence of other specified parts of digestive tract: Secondary | ICD-10-CM

## 2021-12-28 DIAGNOSIS — G4700 Insomnia, unspecified: Secondary | ICD-10-CM | POA: Diagnosis not present

## 2021-12-28 DIAGNOSIS — Z6841 Body Mass Index (BMI) 40.0 and over, adult: Secondary | ICD-10-CM

## 2021-12-28 DIAGNOSIS — S80912A Unspecified superficial injury of left knee, initial encounter: Secondary | ICD-10-CM | POA: Diagnosis not present

## 2021-12-28 DIAGNOSIS — Z66 Do not resuscitate: Secondary | ICD-10-CM | POA: Diagnosis present

## 2021-12-28 DIAGNOSIS — R0902 Hypoxemia: Secondary | ICD-10-CM | POA: Diagnosis not present

## 2021-12-28 DIAGNOSIS — Z888 Allergy status to other drugs, medicaments and biological substances status: Secondary | ICD-10-CM

## 2021-12-28 DIAGNOSIS — Z87891 Personal history of nicotine dependence: Secondary | ICD-10-CM

## 2021-12-28 DIAGNOSIS — Z79899 Other long term (current) drug therapy: Secondary | ICD-10-CM | POA: Diagnosis not present

## 2021-12-28 DIAGNOSIS — N179 Acute kidney failure, unspecified: Secondary | ICD-10-CM | POA: Diagnosis present

## 2021-12-28 DIAGNOSIS — I5043 Acute on chronic combined systolic (congestive) and diastolic (congestive) heart failure: Secondary | ICD-10-CM | POA: Diagnosis not present

## 2021-12-28 DIAGNOSIS — E1169 Type 2 diabetes mellitus with other specified complication: Secondary | ICD-10-CM | POA: Diagnosis not present

## 2021-12-28 DIAGNOSIS — W19XXXA Unspecified fall, initial encounter: Secondary | ICD-10-CM | POA: Diagnosis not present

## 2021-12-28 DIAGNOSIS — Z8616 Personal history of COVID-19: Secondary | ICD-10-CM

## 2021-12-28 DIAGNOSIS — Z743 Need for continuous supervision: Secondary | ICD-10-CM | POA: Diagnosis not present

## 2021-12-28 DIAGNOSIS — I471 Supraventricular tachycardia: Secondary | ICD-10-CM | POA: Diagnosis not present

## 2021-12-28 DIAGNOSIS — S8992XA Unspecified injury of left lower leg, initial encounter: Secondary | ICD-10-CM

## 2021-12-28 DIAGNOSIS — J189 Pneumonia, unspecified organism: Secondary | ICD-10-CM | POA: Diagnosis not present

## 2021-12-28 DIAGNOSIS — E782 Mixed hyperlipidemia: Secondary | ICD-10-CM

## 2021-12-28 DIAGNOSIS — Z86711 Personal history of pulmonary embolism: Secondary | ICD-10-CM

## 2021-12-28 DIAGNOSIS — I5031 Acute diastolic (congestive) heart failure: Secondary | ICD-10-CM | POA: Diagnosis not present

## 2021-12-28 DIAGNOSIS — R52 Pain, unspecified: Secondary | ICD-10-CM | POA: Diagnosis not present

## 2021-12-28 DIAGNOSIS — M1712 Unilateral primary osteoarthritis, left knee: Secondary | ICD-10-CM | POA: Diagnosis not present

## 2021-12-28 DIAGNOSIS — Z713 Dietary counseling and surveillance: Secondary | ICD-10-CM

## 2021-12-28 DIAGNOSIS — W1830XA Fall on same level, unspecified, initial encounter: Secondary | ICD-10-CM | POA: Diagnosis not present

## 2021-12-28 DIAGNOSIS — Y92009 Unspecified place in unspecified non-institutional (private) residence as the place of occurrence of the external cause: Secondary | ICD-10-CM | POA: Diagnosis not present

## 2021-12-28 DIAGNOSIS — F419 Anxiety disorder, unspecified: Secondary | ICD-10-CM | POA: Diagnosis present

## 2021-12-28 DIAGNOSIS — I5023 Acute on chronic systolic (congestive) heart failure: Secondary | ICD-10-CM | POA: Diagnosis not present

## 2021-12-28 DIAGNOSIS — E119 Type 2 diabetes mellitus without complications: Secondary | ICD-10-CM

## 2021-12-28 DIAGNOSIS — Z20822 Contact with and (suspected) exposure to covid-19: Secondary | ICD-10-CM | POA: Diagnosis not present

## 2021-12-28 DIAGNOSIS — R7989 Other specified abnormal findings of blood chemistry: Secondary | ICD-10-CM | POA: Diagnosis not present

## 2021-12-28 DIAGNOSIS — E8809 Other disorders of plasma-protein metabolism, not elsewhere classified: Secondary | ICD-10-CM | POA: Diagnosis present

## 2021-12-28 DIAGNOSIS — Z91199 Patient's noncompliance with other medical treatment and regimen due to unspecified reason: Secondary | ICD-10-CM

## 2021-12-28 DIAGNOSIS — E66813 Obesity, class 3: Secondary | ICD-10-CM

## 2021-12-28 DIAGNOSIS — Z881 Allergy status to other antibiotic agents status: Secondary | ICD-10-CM

## 2021-12-28 DIAGNOSIS — N183 Chronic kidney disease, stage 3 unspecified: Secondary | ICD-10-CM | POA: Diagnosis present

## 2021-12-28 DIAGNOSIS — R059 Cough, unspecified: Secondary | ICD-10-CM | POA: Diagnosis not present

## 2021-12-28 DIAGNOSIS — M25462 Effusion, left knee: Secondary | ICD-10-CM | POA: Diagnosis present

## 2021-12-28 DIAGNOSIS — N1832 Chronic kidney disease, stage 3b: Secondary | ICD-10-CM | POA: Diagnosis present

## 2021-12-28 DIAGNOSIS — Z8249 Family history of ischemic heart disease and other diseases of the circulatory system: Secondary | ICD-10-CM

## 2021-12-28 DIAGNOSIS — R9431 Abnormal electrocardiogram [ECG] [EKG]: Secondary | ICD-10-CM | POA: Diagnosis not present

## 2021-12-28 DIAGNOSIS — I509 Heart failure, unspecified: Secondary | ICD-10-CM | POA: Insufficient documentation

## 2021-12-28 DIAGNOSIS — E1165 Type 2 diabetes mellitus with hyperglycemia: Secondary | ICD-10-CM

## 2021-12-28 DIAGNOSIS — Z90710 Acquired absence of both cervix and uterus: Secondary | ICD-10-CM

## 2021-12-28 LAB — D-DIMER, QUANTITATIVE: D-Dimer, Quant: 0.69 ug/mL-FEU — ABNORMAL HIGH (ref 0.00–0.50)

## 2021-12-28 LAB — CBC WITH DIFFERENTIAL/PLATELET
Abs Immature Granulocytes: 0.04 10*3/uL (ref 0.00–0.07)
Basophils Absolute: 0.1 10*3/uL (ref 0.0–0.1)
Basophils Relative: 1 %
Eosinophils Absolute: 0.2 10*3/uL (ref 0.0–0.5)
Eosinophils Relative: 2 %
HCT: 44.3 % (ref 36.0–46.0)
Hemoglobin: 14.3 g/dL (ref 12.0–15.0)
Immature Granulocytes: 0 %
Lymphocytes Relative: 11 %
Lymphs Abs: 1.1 10*3/uL (ref 0.7–4.0)
MCH: 31.2 pg (ref 26.0–34.0)
MCHC: 32.3 g/dL (ref 30.0–36.0)
MCV: 96.5 fL (ref 80.0–100.0)
Monocytes Absolute: 0.8 10*3/uL (ref 0.1–1.0)
Monocytes Relative: 8 %
Neutro Abs: 7.9 10*3/uL — ABNORMAL HIGH (ref 1.7–7.7)
Neutrophils Relative %: 78 %
Platelets: 172 10*3/uL (ref 150–400)
RBC: 4.59 MIL/uL (ref 3.87–5.11)
RDW: 14.6 % (ref 11.5–15.5)
WBC: 10 10*3/uL (ref 4.0–10.5)
nRBC: 0 % (ref 0.0–0.2)

## 2021-12-28 LAB — COMPREHENSIVE METABOLIC PANEL
ALT: 11 U/L (ref 0–44)
AST: 12 U/L — ABNORMAL LOW (ref 15–41)
Albumin: 4 g/dL (ref 3.5–5.0)
Alkaline Phosphatase: 52 U/L (ref 38–126)
Anion gap: 8 (ref 5–15)
BUN: 29 mg/dL — ABNORMAL HIGH (ref 8–23)
CO2: 28 mmol/L (ref 22–32)
Calcium: 10 mg/dL (ref 8.9–10.3)
Chloride: 105 mmol/L (ref 98–111)
Creatinine, Ser: 1.47 mg/dL — ABNORMAL HIGH (ref 0.44–1.00)
GFR, Estimated: 35 mL/min — ABNORMAL LOW (ref >60)
Glucose, Bld: 99 mg/dL (ref 70–99)
Potassium: 4.1 mmol/L (ref 3.5–5.1)
Sodium: 141 mmol/L (ref 135–145)
Total Bilirubin: 0.9 mg/dL (ref 0.3–1.2)
Total Protein: 7.2 g/dL (ref 6.5–8.1)

## 2021-12-28 LAB — LACTIC ACID, PLASMA
Lactic Acid, Venous: 1 mmol/L (ref 0.5–1.9)
Lactic Acid, Venous: 1.1 mmol/L (ref 0.5–1.9)

## 2021-12-28 LAB — TROPONIN I (HIGH SENSITIVITY)
Troponin I (High Sensitivity): 15 ng/L (ref <18)
Troponin I (High Sensitivity): 14 ng/L (ref <18)

## 2021-12-28 LAB — GLUCOSE, CAPILLARY: Glucose-Capillary: 105 mg/dL — ABNORMAL HIGH (ref 70–99)

## 2021-12-28 LAB — BRAIN NATRIURETIC PEPTIDE: B Natriuretic Peptide: 216.2 pg/mL — ABNORMAL HIGH (ref 0.0–100.0)

## 2021-12-28 LAB — SARS CORONAVIRUS 2 BY RT PCR: SARS Coronavirus 2 by RT PCR: NEGATIVE

## 2021-12-28 MED ORDER — ACETAMINOPHEN 325 MG PO TABS
650.0000 mg | ORAL_TABLET | Freq: Four times a day (QID) | ORAL | Status: DC | PRN
  Administered 2021-12-29 – 2022-01-02 (×8): 650 mg via ORAL
  Filled 2021-12-28 (×9): qty 2

## 2021-12-28 MED ORDER — HYDRALAZINE HCL 20 MG/ML IJ SOLN
10.0000 mg | Freq: Four times a day (QID) | INTRAMUSCULAR | Status: DC | PRN
  Administered 2021-12-28 – 2022-01-02 (×4): 10 mg via INTRAVENOUS
  Filled 2021-12-28 (×4): qty 1

## 2021-12-28 MED ORDER — ACETAMINOPHEN 650 MG RE SUPP: 650.0000 mg | Freq: Four times a day (QID) | RECTAL | Status: DC | PRN

## 2021-12-28 MED ORDER — ALUM & MAG HYDROXIDE-SIMETH 200-200-20 MG/5ML PO SUSP
30.0000 mL | Freq: Four times a day (QID) | ORAL | Status: DC | PRN
  Administered 2021-12-28 – 2022-01-02 (×7): 30 mL via ORAL
  Filled 2021-12-28 (×7): qty 30

## 2021-12-28 MED ORDER — FUROSEMIDE 10 MG/ML IJ SOLN
40.0000 mg | Freq: Two times a day (BID) | INTRAMUSCULAR | Status: DC
  Administered 2021-12-28 – 2021-12-30 (×3): 40 mg via INTRAVENOUS
  Filled 2021-12-28 (×3): qty 4

## 2021-12-28 MED ORDER — SODIUM CHLORIDE 0.9 % IV SOLN
500.0000 mg | Freq: Once | INTRAVENOUS | Status: AC
Start: 2021-12-28 — End: 2021-12-28
  Administered 2021-12-28: 500 mg via INTRAVENOUS
  Filled 2021-12-28: qty 5

## 2021-12-28 MED ORDER — HYDROMORPHONE HCL 1 MG/ML IJ SOLN
0.5000 mg | INTRAMUSCULAR | Status: DC | PRN
  Administered 2021-12-28: 0.5 mg via INTRAVENOUS
  Filled 2021-12-28: qty 0.5

## 2021-12-28 MED ORDER — SODIUM CHLORIDE 0.9 % IV SOLN
1.0000 g | Freq: Once | INTRAVENOUS | Status: AC
  Administered 2021-12-28: 1 g via INTRAVENOUS
  Filled 2021-12-28: qty 10

## 2021-12-28 MED ORDER — DICLOFENAC SODIUM 1 % EX GEL
2.0000 g | Freq: Four times a day (QID) | CUTANEOUS | Status: DC
  Administered 2021-12-29 – 2022-01-03 (×22): 2 g via TOPICAL
  Filled 2021-12-28 (×2): qty 100

## 2021-12-28 MED ORDER — ENOXAPARIN SODIUM 40 MG/0.4ML IJ SOSY
40.0000 mg | PREFILLED_SYRINGE | Freq: Every day | INTRAMUSCULAR | Status: DC
  Administered 2021-12-28 – 2021-12-29 (×2): 40 mg via SUBCUTANEOUS
  Filled 2021-12-28 (×2): qty 0.4

## 2021-12-28 MED ORDER — NITROGLYCERIN IN D5W 200-5 MCG/ML-% IV SOLN: 0.0000 ug/min | INTRAVENOUS | Status: DC

## 2021-12-28 MED ORDER — HYDROXYZINE HCL 10 MG PO TABS
10.0000 mg | ORAL_TABLET | Freq: Three times a day (TID) | ORAL | Status: DC | PRN
  Administered 2021-12-28 – 2022-01-03 (×10): 10 mg via ORAL
  Filled 2021-12-28 (×12): qty 1

## 2021-12-28 NOTE — Progress Notes (Signed)
RT Note: Pt. arrived via [facility] on 2 lpm n/c, upon arrival pt. moved from stretcher to [facility] w/c and placed in rm #5, while moving pt. on room air saturations noted by EMT/ED dropped to 86%, pt. was placed back on 2 lpm n/c, RT to monitor.

## 2021-12-28 NOTE — H&P (Addendum)
History and Physical    Patient: Pamela [name] UVO:[id number] DOB: October 09, 1934 DOA: 12/28/2021 DOS: the patient was seen and examined on 12/29/2021 PCP: Orpah Melter, MD  Patient coming from: Home  Chief Complaint:  Chief Complaint  Patient presents with   Knee Injury   HPI: Pamela [name] is a 87 y.o. female with medical history significant of hypertension, T2DM, HFrEF, CKD stage IIIb, hyperlipidemia and pulmonary embolism who presents to the emergency department via EMS due to left knee pain after sustaining a fall at home.  Patient states that she was going to the bathroom and she was ambulating with a walker, she then felt her legs shaking and weak and she sustained a fall landing on the left knee with subsequent knee pain.  EMS was activated and she was helped from the floor and was taken to the ED for further evaluation and management.  Patient also complained of 3-day onset of cough with thick light yellow phlegm, but denies fever, chills, headache, nausea, vomiting.  She does not have any history of anxiety, however she endorsed being always anxious and worries a lot.  ED Course:  In the emergency department, she was intermittently tachypneic and tachycardic she was noted to be hypoxic with an O2 sat of 85 to 86% on room air, so she was placed on supplemental oxygen via [place] at 2 LPM with improved O2 sats to 96%.  BP was elevated at 180/100 and this even increased to 214/104.  Work-up in the ED showed normal CBC and normal BMP except for BUN/creatinine at 29/1.47 (baseline creatinine at 1.3-1.4).  SARS coronavirus 2 was negative.  BNP 216.2, D-dimer 0.69, lactic acid x2 was negative, troponin x2 was negative. Chest x-ray showed mild bilateral heterogeneous pulmonary opacities, possibly due to pulmonary edema. Left knee pain showed advanced and progressive tricompartmental's arthritis.  Joint effusion with possible intra-articular loose bodies She was treated with IV ceftriaxone and  azithromycin.  Hospitalist was asked to admit patient for further evaluation and management.   Review of Systems: Review of systems as noted in the HPI. All other systems reviewed and are negative.   Past Medical History:  Diagnosis Date   Acute diastolic CHF (congestive heart failure) ([name]) 10/11/2019   Arthritis    Bilateral lower extremity edema    left lower extremity cellulitis   Common bile duct stone 06/22/2017   Diabetes ([name])    Diverticulosis    Duodenal diverticulum, periampullary 11/03/2018   Aborted ERCP 2016   GERD (gastroesophageal reflux disease)    History of hiatal hernia    Hypertension    Kidney disease, chronic, stage III (GFR 30-59 ml/min) (HCC)    Obesity    Osteopenia    Seizures ([name]) 22 yrs ago   no cause found   Tubular adenoma of colon 2009   Past Surgical History:  Procedure Laterality Date   ABDOMINAL HYSTERECTOMY     APPENDECTOMY     CHOLECYSTECTOMY OPEN  1977   COLONOSCOPY WITH PROPOFOL N/A 10/16/2014   Procedure: COLONOSCOPY WITH PROPOFOL;  Surgeon: [name], MD;  Location: [facility] ENDOSCOPY;  Service: Endoscopy;  Laterality: N/A;   ENDOSCOPIC RETROGRADE CHOLANGIOPANCREATOGRAPHY (ERCP) WITH PROPOFOL N/A 06/02/2017   Procedure: ENDOSCOPIC RETROGRADE CHOLANGIOPANCREATOGRAPHY (ERCP) WITH PROPOFOL;  Surgeon: [name], MD;  Location: [facility] ENDOSCOPY;  Service: Endoscopy;  Laterality: N/A;   TONSILLECTOMY      Social History:  reports that she has quit smoking. Her smoking use included cigarettes. She has a 20.00  pack-year smoking history. She has never used smokeless tobacco. She reports that she does not drink alcohol and does not use drugs.   Allergies  Allergen Reactions   Levaquin [Levofloxacin In D5w] Nausea And Vomiting   Lisinopril Cough    History reviewed. No pertinent family history.    Prior to Admission medications   Medication Sig Start Date End Date Taking? Authorizing Provider  acetaminophen (TYLENOL) 500 MG tablet  Take 1 tablet (500 mg total) by mouth every 6 (six) hours as needed for moderate pain. 11/07/18  Yes Sheikh, Omair Latif, DO  calcium carbonate (OSCAL) 1500 (600 Ca) MG TABS tablet Take 1,500 mg by mouth 2 (two) times daily with a meal.   Yes [provider]  dapagliflozin propanediol (FARXIGA) 10 MG TABS tablet Take 10 mg by mouth daily.   Yes [provider]  Lancets (ONETOUCH DELICA PLUS [id number]) [place]  07/09/21  Yes [provider]  lovastatin (MEVACOR) 20 MG tablet Take 20 mg by mouth at bedtime.   Yes [provider]  metoprolol succinate (TOPROL-XL) 25 MG 24 hr tablet Take 25 mg by mouth daily. 11/15/21  Yes [provider]  OVER THE COUNTER MEDICATION Take 1 capsule by mouth daily. Daily fiber 100% psyllium.   Yes [provider]  Probiotic Product (ALIGN) 4 MG CAPS Take 4 mg by mouth daily.   Yes [provider]  traZODone (DESYREL) 50 MG tablet Take 50 mg by mouth at bedtime as needed for sleep.   Yes [provider]  fluticasone (FLONASE) 50 MCG/ACT nasal spray Place 1 spray into both nostrils daily. Patient not taking: Reported on 12/28/2021 08/11/21   Hunsucker, [name], MD  furosemide (LASIX) 20 MG tablet Take 1 tablet (20 mg total) by mouth daily as needed for edema (or weight gain). Patient not taking: Reported on 12/28/2021 05/06/21 05/06/22  Ezekiel Slocumb, DO    Physical Exam: BP (!) 168/99   Pulse (!) 115   Temp 98 F (36.7 C) (Oral)   Resp 18   Ht '5\' 2"'$  (1.575 m)   Wt 122 kg   SpO2 95%   BMI 49.19 kg/m   General: 87 y.o. year-old obese female well developed well nourished in no acute distress.  Alert and oriented x3. HEENT: NCAT, EOMI Neck: Supple, trachea medial Cardiovascular: Regular rate and rhythm with no rubs or gallops.  No thyromegaly or JVD noted.  No lower extremity edema. 2/4 pulses in all 4 extremities. Respiratory: Bilateral rales in lower lobes on auscultation.  No wheezes Abdomen:  Soft, nontender nondistended with normal bowel sounds x4 quadrants. Muskuloskeletal: Tender to palpation of left knee.  Left knee in brace.  No cyanosis, clubbing or edema noted bilaterally Neuro: CN II-XII intact, strength 5/5 x 4, sensation, reflexes intact Skin: No ulcerative lesions noted or rashes Psychiatry: Judgement and insight appear normal. Mood is appropriate for condition and setting          Labs on Admission:  Basic Metabolic Panel: Recent Labs  Lab 12/28/21 1151  NA 141  K 4.1  CL 105  CO2 28  GLUCOSE 99  BUN 29*  CREATININE 1.47*  CALCIUM 10.0   Liver Function Tests: Recent Labs  Lab 12/28/21 1151  AST 12*  ALT 11  ALKPHOS 52  BILITOT 0.9  PROT 7.2  ALBUMIN 4.0   No results for input(s): "LIPASE", "AMYLASE" in the last 168 hours. No results for input(s): "AMMONIA" in the last 168 hours. CBC: Recent  Labs  Lab 12/28/21 1151  WBC 10.0  NEUTROABS 7.9*  HGB 14.3  HCT 44.3  MCV 96.5  PLT 172   Cardiac Enzymes: No results for input(s): "CKTOTAL", "CKMB", "CKMBINDEX", "TROPONINI" in the last 168 hours.  BNP (last 3 results) Recent Labs    04/30/21 1423 12/28/21 1151  BNP 418.1* 216.2*    ProBNP (last 3 results) No results for input(s): "PROBNP" in the last 8760 hours.  CBG: Recent Labs  Lab 12/28/21 2245  GLUCAP 105*    Radiological Exams on Admission: DG Chest Port 1 View  Result Date: 12/28/2021 CLINICAL DATA:  Cough EXAM: PORTABLE CHEST 1 VIEW COMPARISON:  Chest x-ray dated April 30, 2021 FINDINGS: Cardiac and mediastinal contours are unchanged. Unchanged elevation of the right hemidiaphragm. Mild bilateral heterogeneous pulmonary opacities. No large pleural effusion or pneumothorax. IMPRESSION: Mild bilateral heterogeneous pulmonary opacities, possibly due to pulmonary edema. Electronically Signed   By: Yetta Glassman M.D.   On: 12/28/2021 12:38   DG Knee Complete 4 Views Left  Result Date: 12/28/2021 CLINICAL DATA:  Fall.   Knee pain. EXAM: LEFT KNEE - COMPLETE 4+ VIEW COMPARISON:  08/18/2015 FINDINGS: Progressive advanced tricompartmental osteoarthritis with joint space narrowing and marginal osteophytes. Joint effusion, possibly with intra-articular loose bodies. No acute radiographic finding. IMPRESSION: Advanced and progressive tricompartmental osteoarthritis. Joint effusion with possible intra-articular loose bodies. Electronically Signed   By: Nelson Chimes M.D.   On: 12/28/2021 12:37    EKG: I independently viewed the EKG done and my findings are as followed: Normal sinus rhythm at rate of 70 bpm with PVCs and LBBB   Assessment/Plan Present on Admission:  Essential hypertension  CKD (chronic kidney disease) stage 3, GFR 30-59 ml/min (HCC)  Principal Problem:   Acute on chronic systolic CHF (congestive heart failure) (HCC) Active Problems:   Essential hypertension   Mixed hyperlipidemia   CKD (chronic kidney disease) stage 3, GFR 30-59 ml/min (HCC)   Obesity, Class III, BMI 40-49.9 (morbid obesity) ([place])   Type 2 diabetes mellitus, without long-term current use of insulin (HCC)   Hypertensive emergency   CAP (community acquired pneumonia)   Left knee pain   Fall at home, initial encounter   Elevated d-dimer  Acute on chronic systolic CHF Elevated BNP BNP 216.2 Apparently appears that patient has not been compliant with Lasix per med rec Continue total input/output, daily weights and fluid restriction Continue IV Lasix 40 twice daily Continue Cardiac diet  Echocardiogram done on 08/02/2021 showed severe hypokinesis of the mid/distal septal with overall mild LV dysfunction.  LVEF 45 to 50%.  LV has mildly decreased function.  No RWMA.  Mild LVH.  Echocardiogram will be done in the morning in the morning Consider cardiology consult  Hypertensive emergency-resolved Essential hypertension (uncontrolled) Patient's left pain and anxiety appear to have contributed to her elevated blood pressure Atarax  and Dilaudid given Continue IV hydralazine 10 mg every 6 hours as needed for SBP > 170  Presumed CAP POA Patient was started on IV ceftriaxone and azithromycin in the ED She denies fever, chills, WBC was normal Procalcitonin will be checked prior to continuing with antibiotics  Left knee pain due to mechanical fall Left knee pain showed advanced and progressive tricompartmental's arthritis.  Continue Dilaudid, diclofenac topical gel, Tylenol Continue fall precaution neurochecks Continue PT/OT eval and treat  Elevated D-dimer D-dimer 0.69, age-adjusted  D-dimer rules out PE  CKD stage IIIb Creatinine is within baseline range Renally adjust medications, avoid nephrotoxic agents/dehydration/hypotension  Mixed  hyperlipidemia Continue pravastatin  T2DM Hemoglobin A1c about 8 months ago was 6.1 Continue ISS and hypoglycemic  History of pulmonary embolism Patient was not on any anticoagulant per med rec  Medication compliance Patient counseled on importance of being compliant with medication regimen  Obesity (49.19 kg/m) Diet and lifestyle medication  DVT prophylaxis: Lovenox  Code Status: DNR  Consults: None  Family Communication: None at bedside  Severity of Illness: The appropriate patient status for this patient is INPATIENT. Inpatient status is judged to be reasonable and necessary in order to provide the required intensity of service to ensure the patient's safety. The patient's presenting symptoms, physical exam findings, and initial radiographic and laboratory data in the context of their chronic comorbidities is felt to place them at high risk for further clinical deterioration. Furthermore, it is not anticipated that the patient will be medically stable for discharge from the hospital within 2 midnights of admission.   * I certify that at the point of admission it is my clinical judgment that the patient will require inpatient hospital care spanning beyond 2  midnights from the point of admission due to high intensity of service, high risk for further deterioration and high frequency of surveillance required.*  Author: Bernadette Hoit, DO 12/29/2021 1:09 AM  For on call review [email or web address].

## 2021-12-28 NOTE — ED Provider Notes (Signed)
[facility] EMERGENCY DEPT Provider Note   CSN: [id number] Arrival date & time: 12/28/21  1050     History  Chief Complaint  Patient presents with   Knee Injury    Pamela [name] is a 87 y.o. female.  The history is provided by the patient and medical records. No language interpreter was used.  Knee Pain Location:  Knee Time since incident:  1 day Injury: yes   Mechanism of injury: fall   Fall:    Fall occurred:  Standing   Entrapped after fall: no   Knee location:  L knee Pain details:    Quality:  Aching   Radiates to:  Does not radiate   Severity:  Severe   Onset quality:  Sudden   Timing:  Constant   Progression:  Unchanged Relieved by:  Nothing Worsened by:  Bearing weight Ineffective treatments:  None tried Associated symptoms: fatigue   Associated symptoms: no back pain, no fever, no neck pain and no numbness        Home Medications Prior to Admission medications   Medication Sig Start Date End Date Taking? Authorizing Provider  acetaminophen (TYLENOL) 500 MG tablet Take 1 tablet (500 mg total) by mouth every 6 (six) hours as needed for moderate pain. 11/07/18   Marguerita Merles Latif, DO  calcium carbonate (OSCAL) 1500 (600 Ca) MG TABS tablet Take 1,500 mg by mouth 2 (two) times daily with a meal.    [provider]  Calcium Polycarbophil (FIBER) 625 MG TABS 2 tablets as needed    [provider]  dapagliflozin propanediol (FARXIGA) 10 MG TABS tablet Take 10 mg by mouth daily.    [provider]  fluticasone (FLONASE) 50 MCG/ACT nasal spray Place 1 spray into both nostrils daily. 08/11/21   Hunsucker, Lesia Sago, MD  furosemide (LASIX) 20 MG tablet Take 1 tablet (20 mg total) by mouth daily as needed for edema (or weight gain). 05/06/21 05/06/22  Pennie Banter, DO  Lancets (ONETOUCH DELICA PLUS [place]) MISC  07/09/21   [provider]  lovastatin (MEVACOR) 20 MG tablet Take 20 mg by mouth at bedtime.     [provider]  metoprolol succinate (TOPROL-XL) 25 MG 24 hr tablet Take 25 mg by mouth daily. 11/15/21   [provider]  OVER THE COUNTER MEDICATION Take 1 capsule by mouth daily. Daily fiber 100% psyllium.    [provider]  oxybutynin (DITROPAN-XL) 10 MG 24 hr tablet Take 10 mg by mouth daily. 12/02/21   [provider]  Probiotic Product (ALIGN) 4 MG CAPS Take 4 mg by mouth daily.    [provider]  traZODone (DESYREL) 50 MG tablet Take 50 mg by mouth at bedtime as needed for sleep.    [provider]      Allergies    Levaquin [levofloxacin in d5w] and Lisinopril    Review of Systems   Review of Systems  Constitutional:  Positive for chills and fatigue. Negative for diaphoresis and fever.  HENT:  Negative for congestion.   Respiratory:  Positive for cough. Negative for chest tightness, shortness of breath, wheezing and stridor.   Cardiovascular:  Negative for chest pain.  Gastrointestinal:  Negative for abdominal pain, constipation, diarrhea, nausea and vomiting.  Genitourinary:  Negative for dysuria and flank pain.  Musculoskeletal:  Negative for back pain, neck pain and neck stiffness.  Skin:  Negative for rash and wound.  Neurological:  Negative for dizziness, weakness, light-headedness and headaches.  Psychiatric/Behavioral:  Negative for agitation and confusion.   All other systems reviewed and are negative.   Physical Exam Updated Vital Signs BP (!) 156/102 (BP Location: Right Arm)   Pulse 77   Temp 97.9 F (36.6 C) (Oral)   Resp 20   Ht 5\' 2"  (1.575 m)   Wt 120.2 kg   SpO2 94%   BMI 48.47 kg/m  Physical Exam Vitals and nursing note reviewed.  Constitutional:      General: She is not in acute distress.    Appearance: She is well-developed. She is not ill-appearing, toxic-appearing or diaphoretic.  HENT:     Head: Normocephalic and atraumatic.     Nose: No congestion or rhinorrhea.     Mouth/Throat:      Mouth: Mucous membranes are moist.  Eyes:     Conjunctiva/sclera: Conjunctivae normal.     Pupils: Pupils are equal, round, and reactive to light.  Cardiovascular:     Rate and Rhythm: Normal rate and regular rhythm.     Heart sounds: No murmur heard. Pulmonary:     Effort: Pulmonary effort is normal. No respiratory distress.     Breath sounds: Rhonchi and rales present. No wheezing.  Chest:     Chest wall: No tenderness.  Abdominal:     General: Abdomen is flat.     Palpations: Abdomen is soft.     Tenderness: There is no abdominal tenderness. There is no guarding or rebound.  Musculoskeletal:        General: Tenderness and signs of injury present. No swelling.     Cervical back: Neck supple. No tenderness.       Legs:     Comments: Tenderness and bruising of left knee.  Pain with movement.  Intact sensation, strength, pulses distally.  Edema in both legs.  Skin:    General: Skin is warm and dry.     Capillary Refill: Capillary refill takes less than 2 seconds.     Findings: No erythema.  Neurological:     Mental Status: She is alert.  Psychiatric:        Mood and Affect: Mood normal.     ED Results / Procedures / Treatments   Labs (all labs ordered are listed, but only abnormal results are displayed) Labs Reviewed  CBC WITH DIFFERENTIAL/PLATELET - Abnormal; Notable for the following components:      Result Value   Neutro Abs 7.9 (*)    All other components within normal limits  COMPREHENSIVE METABOLIC PANEL - Abnormal; Notable for the following components:   BUN 29 (*)    Creatinine, Ser 1.47 (*)    AST 12 (*)    GFR, Estimated 35 (*)    All other components within normal limits  BRAIN NATRIURETIC PEPTIDE - Abnormal; Notable for the following components:   B Natriuretic Peptide 216.2 (*)    All other components within normal limits  SARS CORONAVIRUS 2 BY RT PCR  CULTURE, BLOOD (ROUTINE X 2)  CULTURE, BLOOD (ROUTINE X 2)  LACTIC ACID, PLASMA  LACTIC ACID, PLASMA   D-DIMER, QUANTITATIVE  TROPONIN I (HIGH SENSITIVITY)  TROPONIN I (HIGH SENSITIVITY)    EKG EKG Interpretation  Date/Time:  Tuesday December 28 2021 11:13:54 EDT Ventricular Rate:  70 PR Interval:  184 QRS Duration: 146 QT Interval:  461 QTC Calculation: 498 R Axis:   -37 Text Interpretation: Sinus rhythm Paired ventricular premature complexes Left bundle branch block when compared to prior, mew PVC. No STEMI Confirmed by  Theda Belfast ([id number]) on 12/28/2021 11:22:25 AM  Radiology DG Chest Port 1 View  Result Date: 12/28/2021 CLINICAL DATA:  Cough EXAM: PORTABLE CHEST 1 VIEW COMPARISON:  Chest x-ray dated April 30, 2021 FINDINGS: Cardiac and mediastinal contours are unchanged. Unchanged elevation of the right hemidiaphragm. Mild bilateral heterogeneous pulmonary opacities. No large pleural effusion or pneumothorax. IMPRESSION: Mild bilateral heterogeneous pulmonary opacities, possibly due to pulmonary edema. Electronically Signed   By: Allegra Lai M.D.   On: 12/28/2021 12:38   DG Knee Complete 4 Views Left  Result Date: 12/28/2021 CLINICAL DATA:  Fall.  Knee pain. EXAM: LEFT KNEE - COMPLETE 4+ VIEW COMPARISON:  08/18/2015 FINDINGS: Progressive advanced tricompartmental osteoarthritis with joint space narrowing and marginal osteophytes. Joint effusion, possibly with intra-articular loose bodies. No acute radiographic finding. IMPRESSION: Advanced and progressive tricompartmental osteoarthritis. Joint effusion with possible intra-articular loose bodies. Electronically Signed   By: Paulina Fusi M.D.   On: 12/28/2021 12:37    Procedures Procedures    CRITICAL CARE Performed by: Canary Brim Kailer Heindel Total critical care time: 35 minutes Critical care time was exclusive of separately billable procedures and treating other patients. Critical care was necessary to treat or prevent imminent or life-threatening deterioration. Critical care was time spent personally by me on the following  activities: development of treatment plan with patient and/or surrogate as well as nursing, discussions with consultants, evaluation of patient's response to treatment, examination of patient, obtaining history from patient or surrogate, ordering and performing treatments and interventions, ordering and review of laboratory studies, ordering and review of radiographic studies, pulse oximetry and re-evaluation of patient's condition.   Medications Ordered in ED Medications  cefTRIAXone (ROCEPHIN) 1 g in sodium chloride 0.9 % 100 mL IVPB (1 g Intravenous New Bag/Given 12/28/21 1419)  azithromycin (ZITHROMAX) 500 mg in sodium chloride 0.9 % 250 mL IVPB (500 mg Intravenous New Bag/Given 12/28/21 1418)    ED Course/ Medical Decision Making/ A&P                           Medical Decision Making Amount and/or Complexity of Data Reviewed Labs: ordered. Radiology: ordered.    Pamela [name] is a 87 y.o. female with a past medical history significant for hypertension, peripheral edema, CHF, previous pulmonary embolism not on anticoagulation, seizures, diabetes, obesity, hyperlipidemia, GERD, and previous appendectomy/cholecystectomy who presents with knee pain after fall.  According to patient, she was getting up and walk to the bathroom this morning when her leg started getting wobbly and she fell to the ground hitting her left knee on the ground.  She reports she could not bear weight and could not walk on it and came in for evaluation.  Patient also reports that for the last few days she has had some chills and some productive cough with a discolored phlegm.  Denies hemoptysis.  Denies any chest pain or acute shortness of breath however oxygen saturation was 86% on room air and she was placed on oxygen.  She does not take oxygen at home.  She denies any nausea, vomiting, constipation, diarrhea, or urinary changes.  On exam, lungs have both rales and rhonchi bilaterally.  Chest and abdomen are  nontender.  Back nontender.  Left knee is very tender to palpation but distally she has intact sensation, strength, and pulses.  No hip tenderness.  No laceration seen.  Bruising seen on the knee.  Due to the patient's knee injury, will get x-ray to look  for acute injury.   X-ray obtained showing no evidence of acute fracture.  Suspect either ligamentous or soft tissue injury.  We will get a knee immobilizer and as she is unable to ambulate whatsoever, she will likely need PT to eval.  For the hypoxia and cough, and history of CHF, will get labs including troponin, BNP, chest x-ray.  BNP returned elevated but actually improved from prior.  Other labs improved from prior.  She does not have a lactic acidosis or leukocytosis however with her cough and chills symptoms I am somewhat concerned about early pneumonia.  She is on 2 L to maintain oxygen saturations at this time when she does not take it at home.  2:09 PM Patient's work-up began to return.  Patient's x-ray does show evidence of airspace opacities that could be edema versus inflammation.  Given the chills, productive cough, and his new hypoxia of 86% on room air with her not having oxygen at home, I am somewhat concerned this could be an early pneumonia.  We will order antibiotics and treat for suspected community-acquired pneumonia leading to hypoxia in the setting of mild fluid overload.  Her BNP is improved from prior and her leg edema has also improved from prior per patient.  She may need a small amount of diuresis as well given the new oxygen requirement.  COVID test negative.  For the knee, there is no evidence of acute fracture although there is some evidence of effusion and some small calcified bodies present.  We will put in knee immobilizer and I suspect she has either ligamentous or tendinous injury versus sprain causing the severe pain in her left knee.  As she reports she cannot bear weight and cannot walk, this is another reason I do  not feel she is safe for discharge home.  She will likely need physical therapy eval and treat to assess mobility needs during her admission.  Given her lack of acute chest pain or shortness of breath despite the mild hypoxia, I have low suspicion for acute pulm embolism at this time.  Due to the new hypoxia, suspected pneumonia, and immobility from the knee injury, do not feel she is safe for discharge home, will call for admission.  2:37 PM Medicine team agreed with admission but they request D-dimer to have more definitively rule out a thromboembolic etiology of this hypoxia.  D-dimer ordered, if it is elevated, anticipate they will do CT PE study.  Patient will be admitted for further management.        Final Clinical Impression(s) / ED Diagnoses Final diagnoses:  Fall, initial encounter  Injury of left knee, initial encounter  Hypoxia  Community acquired pneumonia, unspecified laterality    Clinical Impression: 1. Fall, initial encounter   2. Injury of left knee, initial encounter   3. Hypoxia   4. Community acquired pneumonia, unspecified laterality     Disposition: Admit  This note was prepared with assistance of Conservation officer, historic buildings. Occasional wrong-word or sound-a-like substitutions may have occurred due to the inherent limitations of voice recognition software.       Leya Paige, Canary Brim, MD 12/28/21 [phone]

## 2021-12-29 ENCOUNTER — Inpatient Hospital Stay (HOSPITAL_COMMUNITY): Payer: Medicare HMO

## 2021-12-29 ENCOUNTER — Encounter (HOSPITAL_COMMUNITY): Payer: Self-pay | Admitting: Internal Medicine

## 2021-12-29 DIAGNOSIS — I161 Hypertensive emergency: Secondary | ICD-10-CM

## 2021-12-29 DIAGNOSIS — I5023 Acute on chronic systolic (congestive) heart failure: Secondary | ICD-10-CM

## 2021-12-29 DIAGNOSIS — E1169 Type 2 diabetes mellitus with other specified complication: Secondary | ICD-10-CM

## 2021-12-29 DIAGNOSIS — J189 Pneumonia, unspecified organism: Secondary | ICD-10-CM

## 2021-12-29 DIAGNOSIS — I480 Paroxysmal atrial fibrillation: Secondary | ICD-10-CM

## 2021-12-29 DIAGNOSIS — N1832 Chronic kidney disease, stage 3b: Secondary | ICD-10-CM | POA: Diagnosis not present

## 2021-12-29 DIAGNOSIS — W19XXXA Unspecified fall, initial encounter: Secondary | ICD-10-CM

## 2021-12-29 DIAGNOSIS — M25562 Pain in left knee: Secondary | ICD-10-CM

## 2021-12-29 DIAGNOSIS — I10 Essential (primary) hypertension: Secondary | ICD-10-CM | POA: Diagnosis not present

## 2021-12-29 DIAGNOSIS — I5031 Acute diastolic (congestive) heart failure: Secondary | ICD-10-CM

## 2021-12-29 DIAGNOSIS — R7989 Other specified abnormal findings of blood chemistry: Secondary | ICD-10-CM

## 2021-12-29 LAB — CBC
HCT: 50.3 % — ABNORMAL HIGH (ref 36.0–46.0)
Hemoglobin: 16.3 g/dL — ABNORMAL HIGH (ref 12.0–15.0)
MCH: 31.8 pg (ref 26.0–34.0)
MCHC: 32.4 g/dL (ref 30.0–36.0)
MCV: 98.1 fL (ref 80.0–100.0)
Platelets: 177 10*3/uL (ref 150–400)
RBC: 5.13 MIL/uL — ABNORMAL HIGH (ref 3.87–5.11)
RDW: 14.6 % (ref 11.5–15.5)
WBC: 12.3 10*3/uL — ABNORMAL HIGH (ref 4.0–10.5)
nRBC: 0 % (ref 0.0–0.2)

## 2021-12-29 LAB — COMPREHENSIVE METABOLIC PANEL
ALT: 15 U/L (ref 0–44)
AST: 18 U/L (ref 15–41)
Albumin: 3.9 g/dL (ref 3.5–5.0)
Alkaline Phosphatase: 55 U/L (ref 38–126)
Anion gap: 11 (ref 5–15)
BUN: 25 mg/dL — ABNORMAL HIGH (ref 8–23)
CO2: 27 mmol/L (ref 22–32)
Calcium: 8.9 mg/dL (ref 8.9–10.3)
Chloride: 104 mmol/L (ref 98–111)
Creatinine, Ser: 1.34 mg/dL — ABNORMAL HIGH (ref 0.44–1.00)
GFR, Estimated: 39 mL/min — ABNORMAL LOW (ref >60)
Glucose, Bld: 119 mg/dL — ABNORMAL HIGH (ref 70–99)
Potassium: 3.4 mmol/L — ABNORMAL LOW (ref 3.5–5.1)
Sodium: 142 mmol/L (ref 135–145)
Total Bilirubin: 1.2 mg/dL (ref 0.3–1.2)
Total Protein: 7.6 g/dL (ref 6.5–8.1)

## 2021-12-29 LAB — T4, FREE: Free T4: 0.99 ng/dL (ref 0.61–1.12)

## 2021-12-29 LAB — PROCALCITONIN: Procalcitonin: <0.1 ng/mL

## 2021-12-29 LAB — GLUCOSE, CAPILLARY
Glucose-Capillary: 238 mg/dL — ABNORMAL HIGH (ref 70–99)
Glucose-Capillary: 160 mg/dL — ABNORMAL HIGH (ref 70–99)
Glucose-Capillary: 132 mg/dL — ABNORMAL HIGH (ref 70–99)
Glucose-Capillary: 146 mg/dL — ABNORMAL HIGH (ref 70–99)
Glucose-Capillary: 116 mg/dL — ABNORMAL HIGH (ref 70–99)

## 2021-12-29 LAB — PHOSPHORUS: Phosphorus: 2.9 mg/dL (ref 2.5–4.6)

## 2021-12-29 LAB — TSH: TSH: 3.013 u[IU]/mL (ref 0.350–4.500)

## 2021-12-29 LAB — APTT: aPTT: 30 seconds (ref 24–36)

## 2021-12-29 LAB — MAGNESIUM: Magnesium: 1.9 mg/dL (ref 1.7–2.4)

## 2021-12-29 MED ORDER — TRAZODONE HCL 100 MG PO TABS
100.0000 mg | ORAL_TABLET | Freq: Every evening | ORAL | Status: DC | PRN
Start: 2021-12-29 — End: 2021-12-30

## 2021-12-29 MED ORDER — DAPAGLIFLOZIN PROPANEDIOL 5 MG PO TABS
5.0000 mg | ORAL_TABLET | Freq: Every day | ORAL | Status: DC
Start: 2021-12-30 — End: 2021-12-30
  Administered 2021-12-30: 5 mg via ORAL
  Filled 2021-12-29: qty 1

## 2021-12-29 MED ORDER — METOPROLOL TARTRATE 5 MG/5ML IV SOLN
INTRAVENOUS | Status: AC
  Filled 2021-12-29: qty 5

## 2021-12-29 MED ORDER — METOPROLOL SUCCINATE ER 50 MG PO TB24
50.0000 mg | ORAL_TABLET | Freq: Every evening | ORAL | Status: DC
  Administered 2021-12-29 – 2022-01-02 (×5): 50 mg via ORAL
  Filled 2021-12-29 (×5): qty 1

## 2021-12-29 MED ORDER — ONDANSETRON HCL 4 MG/2ML IJ SOLN
4.0000 mg | Freq: Four times a day (QID) | INTRAMUSCULAR | Status: DC | PRN
  Administered 2021-12-29 – 2022-01-01 (×2): 4 mg via INTRAVENOUS
  Filled 2021-12-29 (×2): qty 2

## 2021-12-29 MED ORDER — METOPROLOL TARTRATE 5 MG/5ML IV SOLN
10.0000 mg | INTRAVENOUS | Status: AC
  Administered 2021-12-29: 10 mg via INTRAVENOUS
  Filled 2021-12-29: qty 10

## 2021-12-29 MED ORDER — INSULIN ASPART 100 UNIT/ML IJ SOLN
0.0000 [IU] | Freq: Three times a day (TID) | INTRAMUSCULAR | Status: DC
  Administered 2021-12-29: 3 [IU] via SUBCUTANEOUS
  Administered 2021-12-29: 1 [IU] via SUBCUTANEOUS
  Administered 2021-12-29: 2 [IU] via SUBCUTANEOUS
  Administered 2021-12-30 – 2021-12-31 (×2): 1 [IU] via SUBCUTANEOUS

## 2021-12-29 MED ORDER — HYDROMORPHONE HCL 1 MG/ML IJ SOLN
0.5000 mg | INTRAMUSCULAR | Status: DC | PRN
  Administered 2021-12-29: 0.5 mg via INTRAVENOUS
  Filled 2021-12-29: qty 1

## 2021-12-29 MED ORDER — HYDROMORPHONE HCL 1 MG/ML IJ SOLN
0.5000 mg | INTRAMUSCULAR | Status: DC | PRN
  Administered 2021-12-29 (×3): 0.5 mg via INTRAVENOUS
  Administered 2021-12-29: 1 mg via INTRAVENOUS
  Administered 2021-12-30 (×3): 0.5 mg via INTRAVENOUS
  Administered 2021-12-30 – 2021-12-31 (×2): 1 mg via INTRAVENOUS
  Administered 2021-12-31: 0.5 mg via INTRAVENOUS
  Filled 2021-12-29 (×10): qty 1

## 2021-12-29 MED ORDER — PRAVASTATIN SODIUM 20 MG PO TABS
20.0000 mg | ORAL_TABLET | Freq: Every day | ORAL | Status: DC
  Administered 2021-12-29 – 2022-01-02 (×5): 20 mg via ORAL
  Filled 2021-12-29 (×5): qty 1

## 2021-12-29 NOTE — Progress Notes (Signed)
Echocardiogram 2D Echocardiogram has been performed.  Pamela [name] 12/29/2021, 9:52 AM

## 2021-12-29 NOTE — Consult Note (Signed)
Cardiology Consultation:   Patient ID: Pamela [name] MRN: [id number]; DOB: Jun 21, 1935  Admit date: 12/28/2021 Date of Consult: 12/29/2021  PCP:  Orpah Melter, MD   [facility] Providers Cardiologist:  None        Patient Profile:   Pamela [name] is a 87 y.o. female with a hx of HTN, T2DM, HFrEF, HLD, PE with COVID infection in 78/2423, chronic diastolic CHF who is being seen 12/29/2021 for the evaluation of atrial fib at the request of Dr. Bonner Puna.  History of Present Illness:   [name] with above hx and PE 04/2021 with RV strain, though this was improved 08/02/21 on Echo.  Though on that echo she had severe hypokinesis of mid/distal septum with mild LV dysfunction.  EF 45-50% , no RWMA mild LVH. Lt atrial size was mod. Dilated.    She completed 6 months of anticoagulation ending 10/2021. Pt had declined cardiology referral in past.    Pt admitted 12/28/21 after presenting with knee injury.  She was walking with her walker her leg felt shaking and weak and she fell onto her left knee.  EMS called.  She has had cough with som light yellow phlegm, no fevers chills.   Her sp02 was 85-86% on RA and with 2L [place] 02 she improved to 96%.  BP was elevated pk 214/104.    BNP 216, DDimer 0.69, lactic acid neg.  Hs troponin 15 and 14.   Na 141, K+ 4.1, BUN 29 Cr 1.47   CBC WNL: No TSH  PCXR IMPRESSION: Mild bilateral heterogeneous pulmonary opacities, possibly due to pulmonary edema EKG:  The EKG was personally reviewed and demonstrates:  initial EKG SR with LBBB old. And not acute changes  today at 0800 EKG atrial fib with LBBB and RVR  at 151 Telemetry:  Telemetry was personally reviewed and demonstrates:  SR  This AM around 0800 she went into a fib with RVR at 150s -she was given lopressor 10 mg and then 5 mg IV.  She is now in SR.  She was unaware of rapid HR   No hx of chest pain she has chronic dyspnea with exertion.  She complains of knee pain today L>R.   BP with atrial fib  161/90 then to SR 116/81  Echo pending    Past Medical History:  Diagnosis Date   Acute diastolic CHF (congestive heart failure) ([place]) 10/11/2019   Arthritis    Bilateral lower extremity edema    left lower extremity cellulitis   Common bile duct stone 06/22/2017   Diabetes ([place])    Diverticulosis    Duodenal diverticulum, periampullary 11/03/2018   Aborted ERCP 2016   GERD (gastroesophageal reflux disease)    History of hiatal hernia    Hypertension    Kidney disease, chronic, stage III (GFR 30-59 ml/min) (HCC)    Obesity    Osteopenia    Seizures ([place]) 22 yrs ago   no cause found   Tubular adenoma of colon 2009    Past Surgical History:  Procedure Laterality Date   ABDOMINAL HYSTERECTOMY     APPENDECTOMY     CHOLECYSTECTOMY OPEN  1977   COLONOSCOPY WITH PROPOFOL N/A 10/16/2014   Procedure: COLONOSCOPY WITH PROPOFOL;  Surgeon: [name], MD;  Location: [facility];  Service: Endoscopy;  Laterality: N/A;   ENDOSCOPIC RETROGRADE CHOLANGIOPANCREATOGRAPHY (ERCP) WITH PROPOFOL N/A 06/02/2017   Procedure: ENDOSCOPIC RETROGRADE CHOLANGIOPANCREATOGRAPHY (ERCP) WITH PROPOFOL;  Surgeon: [name], MD;  Location: [facility];  Service: Endoscopy;  Laterality: N/A;   TONSILLECTOMY       Home Medications:  Prior to Admission medications   Medication Sig Start Date End Date Taking? Authorizing Provider  acetaminophen (TYLENOL) 500 MG tablet Take 1 tablet (500 mg total) by mouth every 6 (six) hours as needed for moderate pain. 11/07/18  Yes Sheikh, Omair Latif, DO  calcium carbonate (OSCAL) 1500 (600 Ca) MG TABS tablet Take 1,500 mg by mouth 2 (two) times daily with a meal.   Yes [provider]  dapagliflozin propanediol (FARXIGA) 10 MG TABS tablet Take 10 mg by mouth daily.   Yes [provider]  Lancets (ONETOUCH DELICA PLUS [id number]) [place]  07/09/21  Yes [provider]  lovastatin (MEVACOR) 20 MG tablet Take 20 mg by mouth at bedtime.   Yes  [provider]  metoprolol succinate (TOPROL-XL) 25 MG 24 hr tablet Take 25 mg by mouth daily. 11/15/21  Yes [provider]  OVER THE COUNTER MEDICATION Take 1 capsule by mouth daily. Daily fiber 100% psyllium.   Yes [provider]  Probiotic Product (ALIGN) 4 MG CAPS Take 4 mg by mouth daily.   Yes [provider]  traZODone (DESYREL) 50 MG tablet Take 50 mg by mouth at bedtime as needed for sleep.   Yes [provider]  fluticasone (FLONASE) 50 MCG/ACT nasal spray Place 1 spray into both nostrils daily. Patient not taking: Reported on 12/28/2021 08/11/21   Hunsucker, [name], MD  furosemide (LASIX) 20 MG tablet Take 1 tablet (20 mg total) by mouth daily as needed for edema (or weight gain). Patient not taking: Reported on 12/28/2021 05/06/21 05/06/22  Ezekiel Slocumb, DO    Inpatient Medications: Scheduled Meds:  diclofenac Sodium  2 g Topical QID   enoxaparin (LOVENOX) injection  40 mg Subcutaneous QHS   furosemide  40 mg Intravenous Q12H   insulin aspart  0-9 Units Subcutaneous TID WC   pravastatin  20 mg Oral q1800   Continuous Infusions:  PRN Meds: acetaminophen **OR** acetaminophen, alum & mag hydroxide-simeth, hydrALAZINE, HYDROmorphone (DILAUDID) injection, hydrOXYzine, ondansetron (ZOFRAN) IV  Allergies:    Allergies  Allergen Reactions   Levaquin [Levofloxacin In D5w] Nausea And Vomiting   Lisinopril Cough    Social History:   Social History   Socioeconomic History   Marital status: Married    Spouse name: Not on file   Number of children: Not on file   Years of education: Not on file   Highest education level: Not on file  Occupational History   Not on file  Tobacco Use   Smoking status: Former    Packs/day: 1.00    Years: 20.00    Total pack years: 20.00    Types: Cigarettes   Smokeless tobacco: Never   Tobacco comments:    quit smoking 30 yrs ago  Vaping Use   Vaping Use: Never used  Substance and Sexual  Activity   Alcohol use: No   Drug use: No   Sexual activity: Not on file  Other Topics Concern   Not on file  Social History Narrative   Not on file   Social Determinants of Health   Financial Resource Strain: Not on file  Food Insecurity: Not on file  Transportation Needs: Not on file  Physical Activity: Not on file  Stress: Not on file  Social Connections: Not on file  Intimate Partner Violence: Not on file    Family History:   Family History  Problem Relation Age of Onset   Hypertension Brother    No CAD in [place].   ROS:  Please see the history of present illness.  General:no colds or fevers, no weight changes Skin:no rashes or ulcers HEENT:no blurred vision, no congestion CV:see HPI PUL:see HPI GI:no diarrhea constipation or melena, no indigestion GU:no hematuria, no dysuria MS:+ knee pain, no claudication hx of lower ext edema back to 2015.  Neuro:no syncope, no lightheadedness Endo:+ diabetes, no thyroid disease  All other ROS reviewed and negative.     Physical Exam/Data:   Vitals:   12/29/21 0825 12/29/21 0838 12/29/21 0845 12/29/21 0951  BP:  122/63 116/81 (!) 164/88  Pulse:  64  75  Resp:    20  Temp:      TempSrc:      SpO2: 92% 95%  93%  Weight:      Height:        Intake/Output Summary (Last 24 hours) at 12/29/2021 1119 Last data filed at 12/29/2021 1100 Gross per 24 hour  Intake 240 ml  Output 700 ml  Net -460 ml      12/28/2021    6:00 PM 12/28/2021   11:05 AM 11/16/2021    2:39 PM  Last 3 Weights  Weight (lbs) 268 lb 15.4 oz 265 lb 260 lb  Weight (kg) 122 kg 120.203 kg 117.935 kg     Body mass index is 49.19 kg/m.  General:  Well nourished, well developed, in no acute distress though having knee pain HEENT: normal Neck: no JVD Vascular: Lt carotid bruit, none on Rt; Distal pulses 1+ bilaterally Cardiac:  normal S1, S2; RRR; no murmur gallup rub or click Lungs:  clear to auscultation bilaterally, no wheezing, rhonchi or rales  Abd:  soft, nontender, no hepatomegaly  Ext: no edema, Lt knee with ecchymosis .  Musculoskeletal:  see above BUE and BLE strength normal and equal Skin: warm and dry  Neuro:  alert and oriented X 3 MAE follows commands, no focal abnormalities noted Psych:  Normal affect    Relevant CV Studies:  Echo today pending   Echo 08/02/21 IMPRESSIONS     1. Severe hypokinesis of the mid/distal septum with overall mild LV  dysfunction.   2. Left ventricular ejection fraction, by estimation, is 45 to 50%. The  left ventricle has mildly decreased function. The left ventricle has no  regional wall motion abnormalities. There is mild left ventricular  hypertrophy.   3. Right ventricular systolic function is normal. The right ventricular  size is normal.   4. Left atrial size was moderately dilated.   5. The mitral valve is normal in structure. Mild mitral valve  regurgitation. No evidence of mitral stenosis.   6. The aortic valve has an indeterminant number of cusps. Aortic valve  regurgitation is not visualized. No aortic stenosis is present.   7. The inferior vena cava is dilated in size with >50% respiratory  variability, suggesting right atrial pressure of 8 mmHg.   Comparison(s): EF 55%, moderate LVH, RV dysfunction with moderate  enlargement, RVSP 31mHg.   FINDINGS   Left Ventricle: Left ventricular ejection fraction, by estimation, is 45  to 50%. The left ventricle has mildly decreased function. The left  ventricle has no regional wall motion abnormalities. The left ventricular  internal cavity size was normal in  size. There is mild left ventricular hypertrophy. Abnormal (paradoxical)  septal motion, consistent with left bundle branch block.   Right Ventricle: The right ventricular size  is normal. Right ventricular  systolic function is normal.   Left Atrium: Left atrial size was moderately dilated.   Right Atrium: Right atrial size was normal in size.   Pericardium: There is no  evidence of pericardial effusion.   Mitral Valve: The mitral valve is normal in structure. Mild mitral annular  calcification. Mild mitral valve regurgitation. No evidence of mitral  valve stenosis.   Tricuspid Valve: The tricuspid valve is normal in structure. Tricuspid  valve regurgitation is mild . No evidence of tricuspid stenosis.   Aortic Valve: The aortic valve has an indeterminant number of cusps.  Aortic valve regurgitation is not visualized. No aortic stenosis is  present. Aortic valve mean gradient measures 3.0 mmHg. Aortic valve peak  gradient measures 7.4 mmHg. Aortic valve  area, by VTI measures 2.33 cm.   Pulmonic Valve: The pulmonic valve was normal in structure. Pulmonic valve  regurgitation is not visualized. No evidence of pulmonic stenosis.   Aorta: The aortic root is normal in size and structure.   Venous: The inferior vena cava is dilated in size with greater than 50%  respiratory variability, suggesting right atrial pressure of 8 mmHg.   IAS/Shunts: No atrial level shunt detected by color flow Doppler.   Laboratory Data:  High Sensitivity Troponin:   Recent Labs  Lab 12/28/21 1151 12/28/21 1921  TROPONINIHS 15 14     Chemistry Recent Labs  Lab 12/28/21 1151 12/29/21 0347  NA 141 142  K 4.1 3.4*  CL 105 104  CO2 28 27  GLUCOSE 99 119*  BUN 29* 25*  CREATININE 1.47* 1.34*  CALCIUM 10.0 8.9  MG  --  1.9  GFRNONAA 35* 39*  ANIONGAP 8 11    Recent Labs  Lab 12/28/21 1151 12/29/21 0347  PROT 7.2 7.6  ALBUMIN 4.0 3.9  AST 12* 18  ALT 11 15  ALKPHOS 52 55  BILITOT 0.9 1.2   Lipids No results for input(s): "CHOL", "TRIG", "HDL", "LABVLDL", "LDLCALC", "CHOLHDL" in the last 168 hours.  Hematology Recent Labs  Lab 12/28/21 1151 12/29/21 0347  WBC 10.0 12.3*  RBC 4.59 5.13*  HGB 14.3 16.3*  HCT 44.3 50.3*  MCV 96.5 98.1  MCH 31.2 31.8  MCHC 32.3 32.4  RDW 14.6 14.6  PLT 172 177   Thyroid No results for input(s): "TSH", "FREET4"  in the last 168 hours.  BNP Recent Labs  Lab 12/28/21 1151  BNP 216.2*    DDimer  Recent Labs  Lab 12/28/21 1432  DDIMER 0.69*     Radiology/Studies:  DG Chest Port 1 View  Result Date: 12/28/2021 CLINICAL DATA:  Cough EXAM: PORTABLE CHEST 1 VIEW COMPARISON:  Chest x-ray dated April 30, 2021 FINDINGS: Cardiac and mediastinal contours are unchanged. Unchanged elevation of the right hemidiaphragm. Mild bilateral heterogeneous pulmonary opacities. No large pleural effusion or pneumothorax. IMPRESSION: Mild bilateral heterogeneous pulmonary opacities, possibly due to pulmonary edema. Electronically Signed   By: Yetta Glassman M.D.   On: 12/28/2021 12:38   DG Knee Complete 4 Views Left  Result Date: 12/28/2021 CLINICAL DATA:  Fall.  Knee pain. EXAM: LEFT KNEE - COMPLETE 4+ VIEW COMPARISON:  08/18/2015 FINDINGS: Progressive advanced tricompartmental osteoarthritis with joint space narrowing and marginal osteophytes. Joint effusion, possibly with intra-articular loose bodies. No acute radiographic finding. IMPRESSION: Advanced and progressive tricompartmental osteoarthritis. Joint effusion with possible intra-articular loose bodies. Electronically Signed   By: Nelson Chimes M.D.   On: 12/28/2021 12:37     Assessment and Plan:  Atrial fib RVR, first episode.  Given IV BB and now.SR  not on anticoagulation  - her lt knee with joint effusion ? Blood with fall?  With CHa2DS2VASc of 6 would benefit from anticoagulation. Though may need further assessment of knee.  Echo pending Hx of decrease in EF in Jan to 45-50% Severe hypokinesis of the mid/distal septum with overall mild LV dysfunction.  Previous RV strain with PE had improved.  She did not have SOB.  LA was moderately dilated  mild MR  Fall due to weakness, ? Related to arrythmia?  Her legs suddenly began shaking and she could not hold herself up event with walker.  Acute on Chronic diastolic/systolic CHF on lasix as outpt. Here on lasix  40 IV BID (has rec'd 2 doses). and is neg 460 since admit  echo pending HTN elevated on arrival now resolved  HLD on statin, continue Hx of PE, on anticoagulation for 6 months then stopped in April.  Mildly elevated ddimer DM-2 per IM   Risk Assessment/Risk Scores:          CHA2DS2-VASc Score = 6   This indicates a 9.7% annual risk of stroke. The patient's score is based upon: CHF History: 1 HTN History: 1 Diabetes History: 1 Stroke History: 0 Vascular Disease History: 0 Age Score: 2 Gender Score: 1         For questions or updates, please contact [place] Please consult [email or web address] for contact info under    Signed, Cecilie Kicks, NP  12/29/2021 11:19 AM

## 2021-12-29 NOTE — TOC Initial Note (Signed)
Transition of Care [facility]) - Initial/Assessment Note    Patient Details  Name: Pamela [name] MRN: [id number] Date of Birth: 05-27-1935  Transition of Care [facility]) CM/SW Contact:    Leeroy Cha, RN Phone Number: 12/29/2021, 8:11 AM  Clinical Narrative:                  Transition of Care [facility]) Screening Note   Patient Details  Name: Pamela [name] Date of Birth: 1934-09-30   Transition of Care [facility]) CM/SW Contact:    Leeroy Cha, RN Phone Number: 12/29/2021, 8:11 AM    Transition of Care Department [facility]) has reviewed patient and no TOC needs have been identified at this time. We will continue to monitor patient advancement through interdisciplinary progression rounds. If new patient transition needs arise, please place a TOC consult.  '  Expected Discharge Plan: Home/Self Care Barriers to Discharge: Continued Medical Work up   Patient Goals and CMS Choice Patient states their goals for this hospitalization and ongoing recovery are:: to go home CMS Medicare.gov Compare Post Acute Care list provided to:: Patient    Expected Discharge Plan and Services Expected Discharge Plan: Home/Self Care   Discharge Planning Services: CM Consult   Living arrangements for the past 2 months: Single Family Home                                      Prior Living Arrangements/Services Living arrangements for the past 2 months: Single Family Home Lives with:: Spouse Patient language and need for interpreter reviewed:: Yes Do you feel safe going back to the place where you live?: Yes            Criminal Activity/Legal Involvement Pertinent to Current Situation/Hospitalization: No - Comment as needed  Activities of Daily Living Home Assistive Devices/Equipment: Eyeglasses, Environmental consultant (specify type), Bedside commode/3-in-1 (stool in the shower) ADL Screening (condition at time of admission) Patient's cognitive ability adequate to safely complete daily activities?:  Yes Is the patient deaf or have difficulty hearing?: Yes (slight) Does the patient have difficulty seeing, even when wearing glasses/contacts?: No Does the patient have difficulty concentrating, remembering, or making decisions?: No Patient able to express need for assistance with ADLs?: Yes Does the patient have difficulty dressing or bathing?: No Independently performs ADLs?: No Communication: Independent Dressing (OT): Independent Grooming: Independent Feeding: Independent Bathing: Independent Toileting: Needs assistance Is this a change from baseline?: Pre-admission baseline In/Out Bed: Needs assistance Is this a change from baseline?: Pre-admission baseline Walks in Home: Needs assistance Is this a change from baseline?: Pre-admission baseline Does the patient have difficulty walking or climbing stairs?: Yes Weakness of Legs: Left Weakness of Arms/Hands: None  Permission Sought/Granted                  Emotional Assessment Appearance:: Appears stated age     Orientation: : Oriented to Self, Oriented to Place, Oriented to  Time, Oriented to Situation Alcohol / Substance Use: Not Applicable Psych Involvement: No (comment)  Admission diagnosis:  Hypoxia [R09.02] Acute CHF (congestive heart failure) ([place]) [I50.9] Injury of left knee, initial encounter [S89.92XA] Fall, initial encounter [W19.XXXA] Community acquired pneumonia, unspecified laterality [J18.9] Patient Active Problem List   Diagnosis Date Noted   Acute on chronic systolic CHF (congestive heart failure) ([place]) 12/29/2021   Hypertensive emergency 12/29/2021   CAP (community acquired pneumonia) 12/29/2021   Left knee pain 12/29/2021  Fall at home, initial encounter 12/29/2021   Elevated d-dimer 12/29/2021   Acute CHF (congestive heart failure) ([place]) 12/28/2021   Pulmonary embolism ([place]) 05/01/2021   Bilateral pulmonary embolism ([place]) 04/30/2021   Acute pulmonary embolism ([place])    COVID-19 virus  infection    Acute diastolic CHF (congestive heart failure) ([place]) 10/11/2019   Pulmonary edema 10/09/2019   Acute respiratory failure with hypoxia ([place]) 10/09/2019   Duodenal diverticulum, periampullary 11/03/2018   Diverticulosis of sigmoid colon 11/03/2018   Intestine with microperforation (Sigmoid vs Meckel's) 11/03/2018   Common bile duct stone 06/22/2017   Mixed hyperlipidemia 06/22/2017   CKD (chronic kidney disease) stage 3, GFR 30-59 ml/min (HCC) 06/22/2017   Chronic pain 06/22/2017   Obesity, Class III, BMI 40-49.9 (morbid obesity) ([place]) 06/22/2017   Seizures ([place]) 06/22/2017   Type 2 diabetes mellitus, without long-term current use of insulin ([place]) 06/22/2017   RUQ pain    Bloating 11/04/2014   Hx of adenomatous colonic polyps 2016    Bilateral lower extremity edema 09/10/2013   Essential hypertension 09/10/2013   PCP:  Orpah Melter, MD Pharmacy:   [id number]Phone: [phone]: [address] [place]Phone: [phone]: 3[place] [place]Phone: [phone]: [phone]    Social Determinants of Health (SDOH) Interventions    Readmission Risk Interventions    05/06/2021    4:05 PM  Readmission Risk Prevention Plan  Transportation Screening Complete  PCP or Specialist Appt within 5-7 Days Complete  Home Care Screening Complete  Medication Review (RN CM) Complete

## 2021-12-29 NOTE — Evaluation (Signed)
Physical Therapy Evaluation Patient Details Name: Pamela [name] MRN: [id number] DOB: 1934/09/15 Today's Date: 12/29/2021  History of Present Illness  87 yo female admitted with CHF, bil knee pain, fall at home. Hx of Sz, obesity, CHF, peripheral edema, PE, DM, COVID, CKD  Clinical Impression  On eval, pt required Mod A +2 for bed mobility. She sat EOB for at least 8 minutes with Min guard assist. She was unable to stand on today. She was unable to perform a lateral scoot on today. O2 88% on 4L O2 with activity. Mobility is currently significantly limited by bilateral knee pain. Will plan to follow pt during this hospital stay. At this time, PT recommendation is for [facility] rehab.        Recommendations for follow up therapy are one component of a multi-disciplinary discharge planning process, led by the attending physician.  Recommendations may be updated based on patient status, additional functional criteria and insurance authorization.  Follow Up Recommendations Skilled nursing-short term rehab (<3 hours/day) Can patient physically be transported by private vehicle: No    Assistance Recommended at Discharge Frequent or constant Supervision/Assistance  Patient can return home with the following  Assist for transportation;Help with stairs or ramp for entrance;Assistance with cooking/housework;A lot of help with walking and/or transfers;A lot of help with bathing/dressing/bathroom    Equipment Recommendations None recommended by PT  Recommendations for Other Services       Functional Status Assessment Patient has had a recent decline in their functional status and demonstrates the ability to make significant improvements in function in a reasonable and predictable amount of time.     Precautions / Restrictions Precautions Precautions: Fall Required Braces or Orthoses:  (has KI in room for L knee but it doesn't fit very well) Restrictions Weight Bearing Restrictions: No       Mobility  Bed Mobility Overal bed mobility: Needs Assistance Bed Mobility: Supine to Sit, Sit to Supine     Supine to sit: Mod assist, +2 for physical assistance, +2 for safety/equipment, HOB elevated Sit to supine: Mod assist, +2 for safety/equipment, +2 for physical assistance, HOB elevated   General bed mobility comments: Assist for trunk and bil LEs. Increased time. Cues provided. Pt did report some dizziness once sitting EOB    Transfers                   General transfer comment: Attempted sit to stand with RW but pt unable 2* pain. KI in place but apparently slid down during supine>sit transition-may end up being more limiting to wear it.    Ambulation/Gait                  Stairs            Wheelchair Mobility    Modified Rankin (Stroke Patients Only)       Balance Overall balance assessment: Needs assistance, History of Falls Sitting-balance support: Feet supported Sitting balance-Leahy Scale: Fair     Standing balance support: Bilateral upper extremity supported, During functional activity, Reliant on assistive device for balance Standing balance-Leahy Scale: Zero                               Pertinent Vitals/Pain Pain Assessment Pain Assessment: Faces Faces Pain Scale: Hurts whole lot Pain Location: bil knees Pain Descriptors / Indicators: Discomfort, Sore, Aching Pain Intervention(s): Limited activity within patient's tolerance, Monitored during session, Repositioned  Home Living Family/patient expects to be discharged to:: Private residence Living Arrangements: Spouse/significant other Available Help at Discharge: Family Type of Home: House Home Access: Stairs to enter   Technical brewer of Steps: 1   Home Layout: One level Home Equipment: Other (comment);Wheelchair - Sport and exercise psychologist Comments: three wheeled rollator    Prior Function Prior Level of Function : Independent/Modified  Independent             Mobility Comments: uses 3 wheeled walker for household ambulation, wheelchair for community mobility       Hand Dominance   Dominant Hand: Right    Extremity/Trunk Assessment   Upper Extremity Assessment Upper Extremity Assessment: Defer to OT evaluation    Lower Extremity Assessment Lower Extremity Assessment: Generalized weakness (per pt, has history of arthritis. assessment limited by pain bilaterally)    Cervical / Trunk Assessment Cervical / Trunk Assessment: Normal  Communication   Communication: No difficulties  Cognition Arousal/Alertness: Awake/alert Behavior During Therapy: WFL for tasks assessed/performed Overall Cognitive Status: Within Functional Limits for tasks assessed                                          General Comments      Exercises     Assessment/Plan    PT Assessment Patient needs continued PT services  PT Problem List Decreased strength;Decreased mobility;Decreased range of motion;Decreased activity tolerance;Decreased balance;Decreased knowledge of use of DME;Pain       PT Treatment Interventions DME instruction;Gait training;Therapeutic activities;Therapeutic exercise;Patient/family education;Balance training;Functional mobility training    PT Goals (Current goals can be found in the Care Plan section)  Acute Rehab PT Goals Patient Stated Goal: less pain. home. PT Goal Formulation: With patient Time For Goal Achievement: 01/12/22 Potential to Achieve Goals: Good    Frequency Min 2X/week     Co-evaluation               AM-PAC PT "6 Clicks" Mobility  Outcome Measure Help needed turning from your back to your side while in a flat bed without using bedrails?: A Lot Help needed moving from lying on your back to sitting on the side of a flat bed without using bedrails?: A Lot Help needed moving to and from a bed to a chair (including a wheelchair)?: Total Help needed standing up  from a chair using your arms (e.g., wheelchair or bedside chair)?: Total Help needed to walk in hospital room?: Total Help needed climbing 3-5 steps with a railing? : Total 6 Click Score: 8    End of Session   Activity Tolerance: Patient limited by pain;Patient limited by fatigue Patient left: in bed;with call bell/phone within reach;with bed alarm set (with cardiology)   PT Visit Diagnosis: Pain;Difficulty in walking, not elsewhere classified (R26.2);Muscle weakness (generalized) (M62.81);History of falling (Z91.81) Pain - Right/Left:  (bil) Pain - part of body: Knee    Time: 3532-9924 PT Time Calculation (min) (ACUTE ONLY): 27 min   Charges:   PT Evaluation $PT Eval Moderate Complexity: 1 Mod            Doreatha Massed, PT Acute Rehabilitation  Office: [phone] Pager: [phone]

## 2021-12-29 NOTE — Evaluation (Signed)
Occupational Therapy Evaluation Patient Details Name: Pamela [name] MRN: [id number] DOB: [birth year] Today's Date: 12/29/2021   History of Present Illness patient is a 87 year old female who presented from home after a fall with left knee pain.patient was admitted with acute on chronic systolic CHF, hypertensive emergency, advanced and progressive tricompartmental arthritis of L knee ad elevated D-dimer. PMH: hypertension, T2DM, HFrEF, CKD stage IIIb, hyperlipidemia and pulmonary embolism   Clinical Impression   Patient is a 87 year old female who was admitted for above. Patient was noted to have had a significant decline in the ability to engage in ADLs impacting independence and increasing caregiver burden. Patient was noted to have decreased functional activity tolerance, decreased endurance, decreased standing balance, decreased safety awareness, and decreased knowledge of AD/AE impacting participation in ADLs. Patient would continue to benefit from skilled OT services at this time while admitted and after d/c to address noted deficits in order to improve overall safety and independence in ADLs.     Recommendations for follow up therapy are one component of a multi-disciplinary discharge planning process, led by the attending physician.  Recommendations may be updated based on patient status, additional functional criteria and insurance authorization.   Follow Up Recommendations  Skilled nursing-short term rehab (<3 hours/day)    Assistance Recommended at Discharge Frequent or constant Supervision/Assistance  Patient can return home with the following Two people to help with walking and/or transfers;Two people to help with bathing/dressing/bathroom;Direct supervision/assist for medications management;Help with stairs or ramp for entrance;Assist for transportation;Direct supervision/assist for financial management;Assistance with cooking/housework    Functional Status Assessment  Patient has  had a recent decline in their functional status and demonstrates the ability to make significant improvements in function in a reasonable and predictable amount of time.  Equipment Recommendations  Other (comment) (defer to next venue)    Recommendations for Other Services       Precautions / Restrictions Precautions Precautions: Fall Required Braces or Orthoses:  (has KI in room for L knee but it doesn't fit very well) Restrictions Weight Bearing Restrictions: No      Mobility Bed Mobility Overal bed mobility: Needs Assistance Bed Mobility: Supine to Sit, Sit to Supine     Supine to sit: Mod assist, +2 for physical assistance, +2 for safety/equipment, HOB elevated Sit to supine: Mod assist, +2 for safety/equipment, +2 for physical assistance, HOB elevated   General bed mobility comments: Assist for trunk and bil LEs. Increased time. Cues provided. Pt did report some dizziness once sitting EOB    Transfers                          Balance Overall balance assessment: Needs assistance, History of Falls Sitting-balance support: Feet supported Sitting balance-Leahy Scale: Fair     Standing balance support: Bilateral upper extremity supported, During functional activity, Reliant on assistive device for balance Standing balance-Leahy Scale: Zero                             ADL either performed or assessed with clinical judgement   ADL Overall ADL's : Needs assistance/impaired Eating/Feeding: Set up;Sitting   Grooming: Set up;Sitting   Upper Body Bathing: Minimal assistance;Bed level   Lower Body Bathing: Bed level;Total assistance   Upper Body Dressing : Minimal assistance;Bed level   Lower Body Dressing: Bed level;Maximal assistance     Toilet Transfer Details (indicate cue type and  reason): attempted standing with pain in bilateral knees limiting patients ability to clear bottom from edge of bed with increased time. patient delined 2nd  attempt. Toileting- Clothing Manipulation and Hygiene: Total assistance;Bed level       Functional mobility during ADLs: +2 for physical assistance;+2 for safety/equipment       Vision Baseline Vision/History: 1 Wears glasses Patient Visual Report: No change from baseline       Perception     Praxis      Pertinent Vitals/Pain Pain Assessment Pain Assessment: Faces Faces Pain Scale: Hurts whole lot Pain Location: bil knees Pain Descriptors / Indicators: Discomfort, Sore, Aching Pain Intervention(s): Limited activity within patient's tolerance, Monitored during session, Premedicated before session, Repositioned     Hand Dominance Right   Extremity/Trunk Assessment Upper Extremity Assessment Upper Extremity Assessment: Overall WFL for tasks assessed (able to reach RUE overhead and pull up in bed. RUE able to reach up to about 90 degrees.)   Lower Extremity Assessment Lower Extremity Assessment: Defer to PT evaluation   Cervical / Trunk Assessment Cervical / Trunk Assessment: Normal   Communication Communication Communication: No difficulties   Cognition Arousal/Alertness: Awake/alert Behavior During Therapy: WFL for tasks assessed/performed Overall Cognitive Status: Within Functional Limits for tasks assessed                                       General Comments       Exercises     Shoulder Instructions      Home Living Family/patient expects to be discharged to:: Private residence Living Arrangements: Spouse/significant other Available Help at Discharge: Family Type of Home: House Home Access: Stairs to enter Technical brewer of Steps: 1   Home Layout: One level     Bathroom Shower/Tub: Occupational psychologist: Handicapped height     Home Equipment: Other (comment);Wheelchair - Biomedical scientist Comments: three wheeled rollator      Prior Functioning/Environment Prior Level of Function :  Independent/Modified Independent             Mobility Comments: uses 3 wheeled walker for household ambulation, wheelchair for community mobility          OT Problem List: Decreased activity tolerance;Decreased safety awareness;Impaired balance (sitting and/or standing);Cardiopulmonary status limiting activity;Decreased knowledge of precautions;Decreased knowledge of use of DME or AE;Pain;Increased edema;Obesity      OT Treatment/Interventions: Self-care/ADL training;Neuromuscular education;Therapeutic exercise;Energy conservation;DME and/or AE instruction;Therapeutic activities;Balance training;Patient/family education    OT Goals(Current goals can be found in the care plan section) Acute Rehab OT Goals Patient Stated Goal: to get pain under control OT Goal Formulation: With patient Time For Goal Achievement: 01/12/22 Potential to Achieve Goals: Fair  OT Frequency: Min 2X/week    Co-evaluation PT/OT/SLP Co-Evaluation/Treatment: Yes Reason for Co-Treatment: For patient/therapist safety;To address functional/ADL transfers PT goals addressed during session: Mobility/safety with mobility OT goals addressed during session: ADL's and self-care      AM-PAC OT "6 Clicks" Daily Activity     Outcome Measure Help from another person eating meals?: A Little Help from another person taking care of personal grooming?: A Little Help from another person toileting, which includes using toliet, bedpan, or urinal?: Total Help from another person bathing (including washing, rinsing, drying)?: A Lot Help from another person to put on and taking off regular upper body clothing?: A Little Help from another person to put on and  taking off regular lower body clothing?: A Lot 6 Click Score: 14   End of Session Equipment Utilized During Treatment: Gait belt;Rolling walker (2 wheels);Oxygen Nurse Communication: Mobility status  Activity Tolerance: Patient limited by pain Patient left: in bed;with  call bell/phone within reach;with bed alarm set;Other (comment) (MD in room)  OT Visit Diagnosis: Unsteadiness on feet (R26.81);Other abnormalities of gait and mobility (R26.89);Muscle weakness (generalized) (M62.81);Pain                Time: 7591-6384 OT Time Calculation (min): 30 min Charges:  OT General Charges $OT Visit: 1 Visit OT Evaluation $OT Eval Moderate Complexity: 1 Mod  Jackelyn Poling OTR/L, MS Acute Rehabilitation Department Office# [phone] Pager# [phone]   Marcellina Millin 12/29/2021, 1:26 PM

## 2021-12-29 NOTE — Progress Notes (Signed)
Progress Note  Patient: Pamela [name] BPZ:[id number] DOB: 28-Nov-1934  DOA: 12/28/2021  DOS: 12/29/2021    Brief hospital course: Pamela [name] is an 87 y.o. female with a history of PE in setting of covid-19 infection, chronic HFrEF, stage IIIb CKD, HTN, HLD, T2DM who fell at home with subsequent left knee pain.  She was hypertensive to 214/104, tachypneic and hypoxemic requiring 2L O2. CBC was normal, SCr 1.47 (baseline 1.3-1.4). BNP 216.2, D-dimer 0.69, lactic acid negative x2, troponin negative x2. Bilateral opacities noted on CXR favoring pulmonary edema. Left knee XR revealed severe, progressive osteoarthritis with joint effusion and possible intra-articular loose bodies.  IV lasix started for acute HFrEF causing acute hypoxic respiratory failure. Antibiotics initially given though pneumonia is felt to be ruled out. She converted to rapid AFib on 6/28 which converted with IV metoprolol. Cardiology is consulted. Orthopedics is consulted for left knee abnormalities with CT pending.   Assessment and Plan: Acute on chronic HFpEF: LVEF has recovered based on repeat echo.  - Continue IV lasix, monitoring I/O, daily weights.  - Echo shows improved LVEF 60-65%, continue farxiga. Based on renal impairment, will decrease dose to '5mg'$ .  - Continue metoprolol succinate  Atrial fibrillation with RVR: Does not appear to be terribly symptomatic.  - Given IV metoprolol '10mg'$  stat with significant improvement. BP stable. Will ask for cardiology recommendations going forward. Given hx LV systolic dysfunction, not using CCB.  - Check TSH.  - CHA2DS2-VASc score is 6, so will need to initiate anticoagulation, but concern for hemarthrosis and possibility of surgical intervention suggests this should not be started at this time.Marland Kitchen  HTN urgency: Pain contributing, has improved.  - Restart home metoprolol succinate, increase to '50mg'$  per cardiology recommendation.  - Pain control as below  Left knee OA and  effusion: Concern for loose bodies in the joint. - Check CT - Spoke with Dr. Lyla Glassing this evening and requested nonurgent consult tomorrow. - Multimodal pain control.  Stage IIIb CKD:  - Avoid nephrotoxins - Renally dose medications  Pneumonia felt to be ruled out as pt is afebrile without cough or leukocytosis on arrival. PCT undetectable. We will monitor cultures off antibiotics  Well-controlled NIDT2DM: HbA1c 6.1%.  - SSI  HLD:  - Continue statin  History of provoked PE (covid-19 infection): Has completed anticoagulation. Did not have bleeding complications. Age-adjusted d-dimer is negative here.   Insomnia:  - Reorder home trazodone  Morbid obesity: Estimated body mass index is 49.19 kg/m as calculated from the following:   Height as of this encounter: '5\' 2"'$  (1.575 m).   Weight as of this encounter: 122 kg.  Subjective: Seen urgently when RN reported VTach on cardiac monitoring. Note admission ECG showed IVCD and monitoring currently shows similar morphology but irregular without P waves, rate in 150's. Pt without chest pain, dyspnea, palpitations. Just left knee pain that is stable and constant, improved with dilaudid. She is alert and aware. Given metoprolol IV with return of p waves, rate down to 70's, BP stable.   Objective: Vitals:   12/29/21 0845 12/29/21 0951 12/29/21 1200 12/29/21 1600  BP: 116/81 (!) 164/88 133/73 (!) 146/67  Pulse:  75 78 74  Resp:  20 16 (!) 23  Temp:   98.7 F (37.1 C)   TempSrc:   Oral   SpO2:  93% 92% 92%  Weight:      Height:       Gen: Obese elderly female in no distress Pulm: Tachypneic without wheezes on  supplemental oxygen.  CV: Rapid irreg without murmur, rub, or gallop. No JVD, no significant dependent edema. GI: Abdomen soft, non-tender, non-distended, with normoactive bowel sounds.  Ext: Warm, dry. Left knee very tender with effusion, no definite hemarthrosis. Pain limits ROM exam, no definite locking. No other  deformities Skin: No rashes, lesions or ulcers on visualized skin. Neuro: Alert and oriented. No focal neurological deficits. Psych: Judgement and insight appear fair. Mood euthymic & affect congruent. Behavior is appropriate.    Data Personally reviewed: CBC: Recent Labs  Lab 12/28/21 1151 12/29/21 0347  WBC 10.0 12.3*  NEUTROABS 7.9*  --   HGB 14.3 16.3*  HCT 44.3 50.3*  MCV 96.5 98.1  PLT 172 007   Basic Metabolic Panel: Recent Labs  Lab 12/28/21 1151 12/29/21 0347  NA 141 142  K 4.1 3.4*  CL 105 104  CO2 28 27  GLUCOSE 99 119*  BUN 29* 25*  CREATININE 1.47* 1.34*  CALCIUM 10.0 8.9  MG  --  1.9  PHOS  --  2.9   GFR: Estimated Creatinine Clearance: 38.2 mL/min (A) (by C-G formula based on SCr of 1.34 mg/dL (H)). Liver Function Tests: Recent Labs  Lab 12/28/21 1151 12/29/21 0347  AST 12* 18  ALT 11 15  ALKPHOS 52 55  BILITOT 0.9 1.2  PROT 7.2 7.6  ALBUMIN 4.0 3.9   No results for input(s): "LIPASE", "AMYLASE" in the last 168 hours. No results for input(s): "AMMONIA" in the last 168 hours. Coagulation Profile: No results for input(s): "INR", "PROTIME" in the last 168 hours. Cardiac Enzymes: No results for input(s): "CKTOTAL", "CKMB", "CKMBINDEX", "TROPONINI" in the last 168 hours. BNP (last 3 results) No results for input(s): "PROBNP" in the last 8760 hours. HbA1C: No results for input(s): "HGBA1C" in the last 72 hours. CBG: Recent Labs  Lab 12/28/21 2245 12/29/21 0743 12/29/21 1232 12/29/21 1632 12/29/21 1746  GLUCAP 105* 238* 160* 132* 146*   Lipid Profile: No results for input(s): "CHOL", "HDL", "LDLCALC", "TRIG", "CHOLHDL", "LDLDIRECT" in the last 72 hours. Thyroid Function Tests: Recent Labs    12/29/21 1303  TSH 3.013  FREET4 0.99   Anemia Panel: No results for input(s): "VITAMINB12", "FOLATE", "FERRITIN", "TIBC", "IRON", "RETICCTPCT" in the last 72 hours. Urine analysis:    Component Value Date/Time   COLORURINE YELLOW  10/09/2019 1517   APPEARANCEUR CLEAR 10/09/2019 1517   LABSPEC 1.010 10/09/2019 1517   PHURINE 6.0 10/09/2019 1517   GLUCOSEU NEGATIVE 10/09/2019 1517   HGBUR TRACE (A) 10/09/2019 1517   BILIRUBINUR NEGATIVE 10/09/2019 [place] 10/09/2019 1517   PROTEINUR 30 (A) 10/09/2019 1517   NITRITE NEGATIVE 10/09/2019 1517   LEUKOCYTESUR NEGATIVE 10/09/2019 1517   Recent Results (from the past 240 hour(s))  Blood culture (routine x 2)     Status: None (Preliminary result)   Collection Time: 12/28/21 11:50 AM   Specimen: BLOOD RIGHT FOREARM  Result Value Ref Range Status   Specimen Description   Final    BLOOD RIGHT FOREARM Performed at [facility], [address], [place], [place] [address]    Special Requests   Final    BOTTLES DRAWN AEROBIC AND ANAEROBIC Blood Culture adequate volume Performed at [facility], [address] [place], [place] [address]    Culture   Final    NO GROWTH < 24 HOURS Performed at [facility], [place] [address] [place],  [address]    Report Status PENDING  Incomplete  SARS Coronavirus  2 by RT PCR (hospital order, performed in [facility] hospital lab) *cepheid single result test* Anterior Nasal Swab     Status: None   Collection Time: 12/28/21 11:51 AM   Specimen: Anterior Nasal Swab  Result Value Ref Range Status   SARS Coronavirus 2 by RT PCR NEGATIVE NEGATIVE Final    Comment: (NOTE) SARS-CoV-2 target nucleic acids are NOT DETECTED.  The SARS-CoV-2 RNA is generally detectable in upper and lower respiratory specimens during the acute phase of infection. The lowest concentration of SARS-CoV-2 viral copies this assay can detect is 250 copies / mL. A negative result does not preclude SARS-CoV-2 infection and should not be used as the sole basis for treatment or other patient management decisions.  A negative result may occur with improper specimen collection / handling, submission of  specimen other than nasopharyngeal swab, presence of viral mutation(s) within the areas targeted by this assay, and inadequate number of viral copies (<250 copies / mL). A negative result must be combined with clinical observations, patient history, and epidemiological information.  Fact Sheet for Patients:   https://www.patel.info/  Fact Sheet for Healthcare Providers: https://hall.com/  This test is not yet approved or  cleared by the [place] FDA and has been authorized for detection and/or diagnosis of SARS-CoV-2 by FDA under an Emergency Use Authorization (EUA).  This EUA will remain in effect (meaning this test can be used) for the duration of the COVID-19 declaration under Section 564(b)(1) of the Act, 21 U.S.C. section 360bbb-3(b)(1), unless the authorization is terminated or revoked sooner.  Performed at [facility], [address] [place], [place] [address]   Blood culture (routine x 2)     Status: None (Preliminary result)   Collection Time: 12/28/21  7:22 PM   Specimen: BLOOD  Result Value Ref Range Status   Specimen Description   Final    BLOOD BLOOD LEFT HAND Performed at [facility] [address] [place], [place] [address]    Special Requests   Final    IN PEDIATRIC BOTTLE Blood Culture adequate volume Performed at [facility] [address] [place], [place] [address]    Culture   Final    NO GROWTH < 12 HOURS Performed at [facility] [address], [place], [place] [address]    Report Status PENDING  Incomplete     ECHOCARDIOGRAM COMPLETE  Result Date: 12/29/2021    ECHOCARDIOGRAM REPORT   Patient Name:   Pamela [name] Date of Exam: 12/29/2021 Medical Rec #:  [id number]      Height:       62.0 in Accession #:    [id number]     Weight:       269.0 lb Date of Birth:  [birth year]       BSA:          2.168 m Patient Age:    50 years       BP:            116/81 mmHg Patient Gender: F              HR:           70 bpm. Exam Location:  Inpatient Procedure: 2D Echo, Color Doppler and Cardiac Doppler Indications:    CHF- acute diastolic  History:        Patient has prior history of Echocardiogram examinations, most  recent 08/02/2021. HFrEF; Risk Factors:Hypertension, Diabetes and                 HLD.  Sonographer:    Joette Catching RCS Referring Phys: [id number] OLADAPO ADEFESO  Sonographer Comments: Study done supine. IMPRESSIONS  1. Left ventricular ejection fraction, by estimation, is 60 to 65%. The left ventricle has normal function. The left ventricle has no regional wall motion abnormalities. Left ventricular diastolic parameters are consistent with Grade I diastolic dysfunction (impaired relaxation).  2. Right ventricular systolic function is normal. The right ventricular size is normal.  3. The mitral valve is normal in structure. No evidence of mitral valve regurgitation. No evidence of mitral stenosis. Moderate mitral annular calcification.  4. The aortic valve is normal in structure. Aortic valve regurgitation is not visualized. No aortic stenosis is present.  5. The inferior vena cava is normal in size with greater than 50% respiratory variability, suggesting right atrial pressure of 3 mmHg. FINDINGS  Left Ventricle: Left ventricular ejection fraction, by estimation, is 60 to 65%. The left ventricle has normal function. The left ventricle has no regional wall motion abnormalities. The left ventricular internal cavity size was normal in size. There is  no left ventricular hypertrophy. Left ventricular diastolic parameters are consistent with Grade I diastolic dysfunction (impaired relaxation). Right Ventricle: The right ventricular size is normal. No increase in right ventricular wall thickness. Right ventricular systolic function is normal. Left Atrium: Left atrial size was normal in size. Right Atrium: Right atrial size was normal in  size. Pericardium: There is no evidence of pericardial effusion. Mitral Valve: The mitral valve is normal in structure. Moderate mitral annular calcification. No evidence of mitral valve regurgitation. No evidence of mitral valve stenosis. Tricuspid Valve: The tricuspid valve is normal in structure. Tricuspid valve regurgitation is mild . No evidence of tricuspid stenosis. Aortic Valve: The aortic valve is normal in structure. Aortic valve regurgitation is not visualized. No aortic stenosis is present. Aortic valve mean gradient measures 5.0 mmHg. Aortic valve peak gradient measures 8.8 mmHg. Aortic valve area, by VTI measures 2.30 cm. Pulmonic Valve: The pulmonic valve was normal in structure. Pulmonic valve regurgitation is not visualized. No evidence of pulmonic stenosis. Aorta: The aortic root is normal in size and structure. Venous: The inferior vena cava is normal in size with greater than 50% respiratory variability, suggesting right atrial pressure of 3 mmHg. IAS/Shunts: No atrial level shunt detected by color flow Doppler.  LEFT VENTRICLE PLAX 2D LVIDd:         3.70 cm   Diastology LVIDs:         2.70 cm   LV e' medial:    5.22 cm/s LV PW:         1.30 cm   LV E/e' medial:  18.0 LV IVS:        1.30 cm   LV e' lateral:   8.92 cm/s LVOT diam:     2.00 cm   LV E/e' lateral: 10.5 LV SV:         75 LV SV Index:   34 LVOT Area:     3.14 cm  RIGHT VENTRICLE            IVC RV Basal diam:  2.80 cm    IVC diam: 2.20 cm RV Mid diam:    1.20 cm RV S prime:     9.68 cm/s TAPSE (M-mode): 1.5 cm LEFT ATRIUM  Index        RIGHT ATRIUM           Index LA diam:        3.60 cm 1.66 cm/m   RA Area:     13.10 cm LA Vol (A2C):   31.8 ml 14.67 ml/m  RA Volume:   30.40 ml  14.02 ml/m LA Vol (A4C):   57.9 ml 26.70 ml/m LA Biplane Vol: 44.0 ml 20.29 ml/m  AORTIC VALVE                     PULMONIC VALVE AV Area (Vmax):    2.53 cm      PV Vmax:          1.15 m/s AV Area (Vmean):   2.32 cm      PV Peak grad:      5.3 mmHg AV Area (VTI):     2.30 cm      PR End Diast Vel: 6.35 msec AV Vmax:           148.00 cm/s AV Vmean:          112.000 cm/s AV VTI:            0.325 m AV Peak Grad:      8.8 mmHg AV Mean Grad:      5.0 mmHg LVOT Vmax:         119.00 cm/s LVOT Vmean:        82.600 cm/s LVOT VTI:          0.238 m LVOT/AV VTI ratio: 0.73  AORTA Ao Root diam: 2.90 cm Ao Asc diam:  3.40 cm MITRAL VALVE               TRICUSPID VALVE MV Area (PHT): 2.57 cm    TR Peak grad:   28.3 mmHg MV Decel Time: 296 msec    TR Vmax:        266.00 cm/s MV E velocity: 94.00 cm/s MV A velocity: 41.93 cm/s  SHUNTS MV E/A ratio:  2.24        Systemic VTI:  0.24 m                            Systemic Diam: 2.00 cm Candee Furbish MD Electronically signed by Candee Furbish MD Signature Date/Time: 12/29/2021/2:37:29 PM    Final    DG Chest Port 1 View  Result Date: 12/28/2021 CLINICAL DATA:  Cough EXAM: PORTABLE CHEST 1 VIEW COMPARISON:  Chest x-ray dated April 30, 2021 FINDINGS: Cardiac and mediastinal contours are unchanged. Unchanged elevation of the right hemidiaphragm. Mild bilateral heterogeneous pulmonary opacities. No large pleural effusion or pneumothorax. IMPRESSION: Mild bilateral heterogeneous pulmonary opacities, possibly due to pulmonary edema. Electronically Signed   By: Yetta Glassman M.D.   On: 12/28/2021 12:38   DG Knee Complete 4 Views Left  Result Date: 12/28/2021 CLINICAL DATA:  Fall.  Knee pain. EXAM: LEFT KNEE - COMPLETE 4+ VIEW COMPARISON:  08/18/2015 FINDINGS: Progressive advanced tricompartmental osteoarthritis with joint space narrowing and marginal osteophytes. Joint effusion, possibly with intra-articular loose bodies. No acute radiographic finding. IMPRESSION: Advanced and progressive tricompartmental osteoarthritis. Joint effusion with possible intra-articular loose bodies. Electronically Signed   By: Nelson Chimes M.D.   On: 12/28/2021 12:37    Family Communication: None at bedside  Disposition: Status is:  Inpatient Remains inpatient appropriate because: Continue management of acute CHF and AFib with RVR  Planned Discharge Destination: Skilled nursing facility      Patrecia Pour, MD 12/29/2021 6:05 PM Page by [email or web address]

## 2021-12-30 ENCOUNTER — Other Ambulatory Visit (HOSPITAL_COMMUNITY): Payer: Self-pay

## 2021-12-30 ENCOUNTER — Inpatient Hospital Stay (HOSPITAL_COMMUNITY): Payer: Medicare HMO

## 2021-12-30 DIAGNOSIS — I161 Hypertensive emergency: Secondary | ICD-10-CM | POA: Diagnosis not present

## 2021-12-30 DIAGNOSIS — W19XXXA Unspecified fall, initial encounter: Secondary | ICD-10-CM | POA: Diagnosis not present

## 2021-12-30 DIAGNOSIS — N1832 Chronic kidney disease, stage 3b: Secondary | ICD-10-CM | POA: Diagnosis not present

## 2021-12-30 DIAGNOSIS — I480 Paroxysmal atrial fibrillation: Secondary | ICD-10-CM | POA: Diagnosis not present

## 2021-12-30 DIAGNOSIS — I10 Essential (primary) hypertension: Secondary | ICD-10-CM | POA: Diagnosis not present

## 2021-12-30 DIAGNOSIS — I5023 Acute on chronic systolic (congestive) heart failure: Secondary | ICD-10-CM | POA: Diagnosis not present

## 2021-12-30 LAB — CBC
HCT: 44.8 % (ref 36.0–46.0)
Hemoglobin: 14.4 g/dL (ref 12.0–15.0)
MCH: 32.3 pg (ref 26.0–34.0)
MCHC: 32.1 g/dL (ref 30.0–36.0)
MCV: 100.4 fL — ABNORMAL HIGH (ref 80.0–100.0)
Platelets: 161 10*3/uL (ref 150–400)
RBC: 4.46 MIL/uL (ref 3.87–5.11)
RDW: 14 % (ref 11.5–15.5)
WBC: 10.9 10*3/uL — ABNORMAL HIGH (ref 4.0–10.5)
nRBC: 0 % (ref 0.0–0.2)

## 2021-12-30 LAB — GLUCOSE, CAPILLARY
Glucose-Capillary: 89 mg/dL (ref 70–99)
Glucose-Capillary: 83 mg/dL (ref 70–99)
Glucose-Capillary: 139 mg/dL — ABNORMAL HIGH (ref 70–99)
Glucose-Capillary: 134 mg/dL — ABNORMAL HIGH (ref 70–99)

## 2021-12-30 LAB — BASIC METABOLIC PANEL
Anion gap: 9 (ref 5–15)
BUN: 37 mg/dL — ABNORMAL HIGH (ref 8–23)
CO2: 27 mmol/L (ref 22–32)
Calcium: 8.3 mg/dL — ABNORMAL LOW (ref 8.9–10.3)
Chloride: 102 mmol/L (ref 98–111)
Creatinine, Ser: 2.22 mg/dL — ABNORMAL HIGH (ref 0.44–1.00)
GFR, Estimated: 21 mL/min — ABNORMAL LOW (ref >60)
Glucose, Bld: 89 mg/dL (ref 70–99)
Potassium: 4.3 mmol/L (ref 3.5–5.1)
Sodium: 138 mmol/L (ref 135–145)

## 2021-12-30 LAB — ECHOCARDIOGRAM COMPLETE
AR max vel: 2.53 cm2
AV Area VTI: 2.3 cm2
AV Area mean vel: 2.32 cm2
AV Mean grad: 5 mmHg
AV Peak grad: 8.8 mmHg
Ao pk vel: 1.48 m/s
Area-P 1/2: 2.57 cm2
Height: 62 in
S' Lateral: 2.7 cm
Weight: 4303.38 oz

## 2021-12-30 LAB — HEMOGLOBIN A1C
Hgb A1c MFr Bld: 5.7 % — ABNORMAL HIGH (ref 4.8–5.6)
Mean Plasma Glucose: 117 mg/dL

## 2021-12-30 LAB — MAGNESIUM: Magnesium: 2.2 mg/dL (ref 1.7–2.4)

## 2021-12-30 MED ORDER — HYDRALAZINE HCL 25 MG PO TABS
25.0000 mg | ORAL_TABLET | Freq: Three times a day (TID) | ORAL | Status: DC
  Administered 2021-12-30 – 2021-12-31 (×3): 25 mg via ORAL
  Filled 2021-12-30 (×3): qty 1

## 2021-12-30 MED ORDER — METHYLPREDNISOLONE ACETATE 80 MG/ML IJ SUSP
80.0000 mg | Freq: Once | INTRAMUSCULAR | Status: AC
Start: 2021-12-30 — End: 2021-12-30
  Administered 2021-12-30: 80 mg via INTRA_ARTICULAR
  Filled 2021-12-30: qty 1

## 2021-12-30 MED ORDER — LIDOCAINE HCL (PF) 1 % IJ SOLN
30.0000 mL | Freq: Once | INTRAMUSCULAR | Status: AC
  Administered 2021-12-30: 30 mL
  Filled 2021-12-30: qty 30

## 2021-12-30 MED ORDER — BUPIVACAINE HCL 0.25 % IJ SOLN
15.0000 mL | Freq: Once | INTRAMUSCULAR | Status: AC
Start: 2021-12-30 — End: 2021-12-30
  Administered 2021-12-30: 6 mL
  Filled 2021-12-30: qty 15

## 2021-12-30 MED ORDER — LIDOCAINE HCL 1 % IJ SOLN
15.0000 mL | Freq: Once | INTRAMUSCULAR | Status: AC
  Administered 2021-12-30: 2 mL
  Filled 2021-12-30: qty 15

## 2021-12-30 MED ORDER — TRAZODONE HCL 100 MG PO TABS
100.0000 mg | ORAL_TABLET | Freq: Every day | ORAL | Status: DC
  Administered 2021-12-30 – 2022-01-02 (×4): 100 mg via ORAL
  Filled 2021-12-30 (×4): qty 1

## 2021-12-30 MED ORDER — APIXABAN 2.5 MG PO TABS
2.5000 mg | ORAL_TABLET | Freq: Two times a day (BID) | ORAL | Status: DC
  Administered 2021-12-30 – 2022-01-03 (×8): 2.5 mg via ORAL
  Filled 2021-12-30 (×8): qty 1

## 2021-12-30 NOTE — Progress Notes (Signed)
Progress Note  Patient Name: Pamela [name] Date of Encounter: 12/30/2021  [facility] Cardiologist: None   Subjective   Feeling a little better.  Still having pain in her knee.  Inpatient Medications    Scheduled Meds:  dapagliflozin propanediol  5 mg Oral Daily   diclofenac Sodium  2 g Topical QID   enoxaparin (LOVENOX) injection  40 mg Subcutaneous QHS   insulin aspart  0-9 Units Subcutaneous TID WC   lidocaine (PF)  30 mL Infiltration Once   metoprolol succinate  50 mg Oral QPM   pravastatin  20 mg Oral q1800   Continuous Infusions:  PRN Meds: acetaminophen **OR** acetaminophen, alum & mag hydroxide-simeth, hydrALAZINE, HYDROmorphone (DILAUDID) injection, hydrOXYzine, ondansetron (ZOFRAN) IV, traZODone   Vital Signs    Vitals:   12/29/21 1929 12/30/21 0000 12/30/21 0400 12/30/21 0438  BP:  129/82 (!) 154/88   Pulse: 83 70 64   Resp:  14 14   Temp:  98.5 F (36.9 C) 98.8 F (37.1 C)   TempSrc:  Oral Oral   SpO2:  97% 95%   Weight:    120.3 kg  Height:        Intake/Output Summary (Last 24 hours) at 12/30/2021 0931 Last data filed at 12/30/2021 0900 Gross per 24 hour  Intake 360 ml  Output 1452 ml  Net -1092 ml      12/30/2021    4:38 AM 12/28/2021    6:00 PM 12/28/2021   11:05 AM  Last 3 Weights  Weight (lbs) 265 lb 3.4 oz 268 lb 15.4 oz 265 lb  Weight (kg) 120.3 kg 122 kg 120.203 kg      Telemetry    Sinus rhythm.  PVCs.  Atrial tachycardia.- Personally Reviewed  ECG    Sinus rhythm.  Rate 70 bpm.  Left bundle branch block.  PVCs. - Personally Reviewed  Physical Exam   VS:  BP (!) 154/88 (BP Location: Left Wrist)   Pulse 64   Temp 98.8 F (37.1 C) (Oral)   Resp 14   Ht '5\' 2"'$  (1.575 m)   Wt 120.3 kg   SpO2 95%   BMI 48.51 kg/m  , BMI Body mass index is 48.51 kg/m. GENERAL:  Well appearing HEENT: Pupils equal round and reactive, fundi not visualized, oral mucosa unremarkable NECK:  No jugular venous distention, waveform within  normal limits, carotid upstroke brisk and symmetric, no bruits, no thyromegaly LUNGS:  Clear to auscultation bilaterally HEART:  RRR.  PMI not displaced or sustained,S1 and S2 within normal limits, no S3, no S4, no clicks, no rubs, no murmurs ABD:  Flat, positive bowel sounds normal in frequency in pitch, no bruits, no rebound, no guarding, no midline pulsatile mass, no hepatomegaly, no splenomegaly EXT:  2 plus pulses throughout, no edema, no cyanosis no clubbing SKIN:  No rashes no nodules NEURO:  Cranial nerves II through XII grossly intact, motor grossly intact throughout PSYCH:  Cognitively intact, oriented to person place and time   Labs    High Sensitivity Troponin:   Recent Labs  Lab 12/28/21 1151 12/28/21 1921  TROPONINIHS 15 14     Chemistry Recent Labs  Lab 12/28/21 1151 12/29/21 0347 12/30/21 0359  NA 141 142 138  K 4.1 3.4* 4.3  CL 105 104 102  CO2 '28 27 27  '$ GLUCOSE 99 119* 89  BUN 29* 25* 37*  CREATININE 1.47* 1.34* 2.22*  CALCIUM 10.0 8.9 8.3*  MG  --  1.9 2.2  PROT 7.2 7.6  --   ALBUMIN 4.0 3.9  --   AST 12* 18  --   ALT 11 15  --   ALKPHOS 52 55  --   BILITOT 0.9 1.2  --   GFRNONAA 35* 39* 21*  ANIONGAP '8 11 9    '$ Lipids No results for input(s): "CHOL", "TRIG", "HDL", "LABVLDL", "LDLCALC", "CHOLHDL" in the last 168 hours.  Hematology Recent Labs  Lab 12/28/21 1151 12/29/21 0347 12/30/21 0359  WBC 10.0 12.3* 10.9*  RBC 4.59 5.13* 4.46  HGB 14.3 16.3* 14.4  HCT 44.3 50.3* 44.8  MCV 96.5 98.1 100.4*  MCH 31.2 31.8 32.3  MCHC 32.3 32.4 32.1  RDW 14.6 14.6 14.0  PLT 172 177 161   Thyroid  Recent Labs  Lab 12/29/21 1303  TSH 3.013  FREET4 0.99    BNP Recent Labs  Lab 12/28/21 1151  BNP 216.2*    DDimer  Recent Labs  Lab 12/28/21 1432  DDIMER 0.69*     Radiology    ECHOCARDIOGRAM COMPLETE  Result Date: 12/30/2021    ECHOCARDIOGRAM REPORT   Patient Name:   ESMIRNA RAVAN Date of Exam: 12/29/2021 Medical Rec #:  [id number]       Height:       62.0 in Accession #:    [id number]     Weight:       269.0 lb Date of Birth:  03-Feb-1935       BSA:          2.168 m Patient Age:    87 years       BP:           116/81 mmHg Patient Gender: F              HR:           70 bpm. Exam Location:  Inpatient Procedure: 2D Echo, Color Doppler and Cardiac Doppler                              MODIFIED REPORT: This report was modified by Candee Furbish MD on 12/30/2021 due to Reduced EF.  Indications:     CHF- acute diastolic  History:         Patient has prior history of Echocardiogram examinations, most                  recent 08/02/2021. HFrEF; Risk Factors:Hypertension, Diabetes                  and HLD.  Sonographer:     Joette Catching RCS Referring Phys:  [id number] OLADAPO ADEFESO Diagnosing Phys: Candee Furbish MD  Sonographer Comments: Study done supine. IMPRESSIONS  1. Left ventricular ejection fraction, by estimation, is 45 to 50%. The left ventricle has mildly decreased function. The left ventricle demonstrates global hypokinesis. There is mild left ventricular hypertrophy. Left ventricular diastolic parameters are consistent with Grade II diastolic dysfunction (pseudonormalization).  2. Right ventricular systolic function is normal. The right ventricular size is normal.  3. The mitral valve is normal in structure. No evidence of mitral valve regurgitation. No evidence of mitral stenosis. Moderate mitral annular calcification.  4. The aortic valve is normal in structure. Aortic valve regurgitation is not visualized. No aortic stenosis is present.  5. The inferior vena cava is normal in size with greater than 50% respiratory variability, suggesting right atrial pressure of 3 mmHg. FINDINGS  Left Ventricle:  Left ventricular ejection fraction, by estimation, is 45 to 50%. The left ventricle has mildly decreased function. The left ventricle demonstrates global hypokinesis. The left ventricular internal cavity size was normal in size. There is  mild left ventricular  hypertrophy. Left ventricular diastolic parameters are consistent with Grade II diastolic dysfunction (pseudonormalization). Right Ventricle: The right ventricular size is normal. No increase in right ventricular wall thickness. Right ventricular systolic function is normal. Left Atrium: Left atrial size was normal in size. Right Atrium: Right atrial size was normal in size. Pericardium: There is no evidence of pericardial effusion. Mitral Valve: The mitral valve is normal in structure. Moderate mitral annular calcification. No evidence of mitral valve regurgitation. No evidence of mitral valve stenosis. Tricuspid Valve: The tricuspid valve is normal in structure. Tricuspid valve regurgitation is mild . No evidence of tricuspid stenosis. Aortic Valve: The aortic valve is normal in structure. Aortic valve regurgitation is not visualized. No aortic stenosis is present. Aortic valve mean gradient measures 5.0 mmHg. Aortic valve peak gradient measures 8.8 mmHg. Aortic valve area, by VTI measures 2.30 cm. Pulmonic Valve: The pulmonic valve was normal in structure. Pulmonic valve regurgitation is trivial. No evidence of pulmonic stenosis. Aorta: The aortic root is normal in size and structure. Venous: The inferior vena cava is normal in size with greater than 50% respiratory variability, suggesting right atrial pressure of 3 mmHg. IAS/Shunts: No atrial level shunt detected by color flow Doppler.  LEFT VENTRICLE PLAX 2D LVIDd:         3.70 cm   Diastology LVIDs:         2.70 cm   LV e' medial:    5.22 cm/s LV PW:         1.30 cm   LV E/e' medial:  18.0 LV IVS:        1.30 cm   LV e' lateral:   8.92 cm/s LVOT diam:     2.00 cm   LV E/e' lateral: 10.5 LV SV:         75 LV SV Index:   34 LVOT Area:     3.14 cm  RIGHT VENTRICLE            IVC RV Basal diam:  2.80 cm    IVC diam: 2.20 cm RV Mid diam:    1.20 cm RV S prime:     9.68 cm/s TAPSE (M-mode): 1.5 cm LEFT ATRIUM             Index        RIGHT ATRIUM           Index  LA diam:        3.60 cm 1.66 cm/m   RA Area:     13.10 cm LA Vol (A2C):   31.8 ml 14.67 ml/m  RA Volume:   30.40 ml  14.02 ml/m LA Vol (A4C):   57.9 ml 26.70 ml/m LA Biplane Vol: 44.0 ml 20.29 ml/m  AORTIC VALVE                     PULMONIC VALVE AV Area (Vmax):    2.53 cm      PV Vmax:          1.15 m/s AV Area (Vmean):   2.32 cm      PV Peak grad:     5.3 mmHg AV Area (VTI):     2.30 cm      PR End Diast Vel: 6.35  msec AV Vmax:           148.00 cm/s AV Vmean:          112.000 cm/s AV VTI:            0.325 m AV Peak Grad:      8.8 mmHg AV Mean Grad:      5.0 mmHg LVOT Vmax:         119.00 cm/s LVOT Vmean:        82.600 cm/s LVOT VTI:          0.238 m LVOT/AV VTI ratio: 0.73  AORTA Ao Root diam: 2.90 cm Ao Asc diam:  3.40 cm MITRAL VALVE               TRICUSPID VALVE MV Area (PHT): 2.57 cm    TR Peak grad:   28.3 mmHg MV Decel Time: 296 msec    TR Vmax:        266.00 cm/s MV E velocity: 94.00 cm/s MV A velocity: 41.93 cm/s  SHUNTS MV E/A ratio:  2.24        Systemic VTI:  0.24 m                            Systemic Diam: 2.00 cm Candee Furbish MD Electronically signed by Candee Furbish MD Signature Date/Time: 12/29/2021/2:37:29 PM    Final (Updated)    DG Chest Port 1 View  Result Date: 12/28/2021 CLINICAL DATA:  Cough EXAM: PORTABLE CHEST 1 VIEW COMPARISON:  Chest x-ray dated April 30, 2021 FINDINGS: Cardiac and mediastinal contours are unchanged. Unchanged elevation of the right hemidiaphragm. Mild bilateral heterogeneous pulmonary opacities. No large pleural effusion or pneumothorax. IMPRESSION: Mild bilateral heterogeneous pulmonary opacities, possibly due to pulmonary edema. Electronically Signed   By: Yetta Glassman M.D.   On: 12/28/2021 12:38   DG Knee Complete 4 Views Left  Result Date: 12/28/2021 CLINICAL DATA:  Fall.  Knee pain. EXAM: LEFT KNEE - COMPLETE 4+ VIEW COMPARISON:  08/18/2015 FINDINGS: Progressive advanced tricompartmental osteoarthritis with joint space narrowing and marginal  osteophytes. Joint effusion, possibly with intra-articular loose bodies. No acute radiographic finding. IMPRESSION: Advanced and progressive tricompartmental osteoarthritis. Joint effusion with possible intra-articular loose bodies. Electronically Signed   By: Nelson Chimes M.D.   On: 12/28/2021 12:37    Cardiac Studies   Echo 12/29/21: 1. Left ventricular ejection fraction, by estimation, is 45 to 50%. The  left ventricle has mildly decreased function. The left ventricle  demonstrates global hypokinesis. There is mild left ventricular  hypertrophy. Left ventricular diastolic parameters  are consistent with Grade II diastolic dysfunction (pseudonormalization).   2. Right ventricular systolic function is normal. The right ventricular  size is normal.   3. The mitral valve is normal in structure. No evidence of mitral valve  regurgitation. No evidence of mitral stenosis. Moderate mitral annular  calcification.   4. The aortic valve is normal in structure. Aortic valve regurgitation is  not visualized. No aortic stenosis is present.   5. The inferior vena cava is normal in size with greater than 50%  respiratory variability, suggesting right atrial pressure of 3 mmHg.   Patient Profile      Ms. Crossin is an 61F with chronic systolic and diastolic heart failure, hypertension, hyperlipidemia, diabetes, COVID-19 infection with associated pulmonary embolism (04/2021 admitted with new onset atrial fibrillation.    Assessment & Plan    #Chronic systolic and diastolic heart  failure: #Essential hypertension: LVEF this admission 45 to 50% on echo yesterday.  Essentially unchanged from prior.  She appears to be euvolemic on exam.  Renal function worsened with diuresis.  Agree with stopping IV Lasix.  Blood pressure is poorly controlled.  For now, we will add hydralazine 25 mg 3 times daily.  Once renal function improves, ideally this was switch to [facility] or an ARB and spironolactone would be added.   She is already on [place].  #PAF: New onset this admission.  She converted back to sinus rhythm.  Metoprolol dose was increased.  CHA2DS2-VASc score of 6.  Start Eliquis when her knee is stable.  #Atrial tachycardia: Continue metoprolol.   We will follow from [place].  Please call if there are questions.  For questions or updates, please contact [place] Please consult [email or web address] for contact info under        Signed, Skeet Latch, MD  12/30/2021, 9:31 AM

## 2021-12-30 NOTE — Progress Notes (Addendum)
ANTICOAGULATION CONSULT NOTE - Initial Consult  Pharmacy Consult for Eliquis Indication: atrial fibrillation  Allergies  Allergen Reactions   Levaquin [Levofloxacin In D5w] Nausea And Vomiting   Lisinopril Cough    Patient Measurements: Height: '5\' 2"'$  (157.5 cm) Weight: 120.3 kg (265 lb 3.4 oz) IBW/kg (Calculated) : 50.1  Vital Signs: Temp: 98.7 F (37.1 C) (06/29 1315) Temp Source: Oral (06/29 1315) BP: 133/65 (06/29 1315) Pulse Rate: 75 (06/29 1315)  Labs: Recent Labs    12/28/21 1151 12/28/21 1921 12/29/21 0347 12/30/21 0359  HGB 14.3  --  16.3* 14.4  HCT 44.3  --  50.3* 44.8  PLT 172  --  177 161  APTT  --   --  30  --   CREATININE 1.47*  --  1.34* 2.22*  TROPONINIHS 15 14  --   --     Estimated Creatinine Clearance: 22.9 mL/min (A) (by C-G formula based on SCr of 2.22 mg/dL (H)).   Medical History: Past Medical History:  Diagnosis Date   Acute diastolic CHF (congestive heart failure) ([place]) 10/11/2019   Arthritis    Bilateral lower extremity edema    left lower extremity cellulitis   Common bile duct stone 06/22/2017   Diabetes ([place])    Diverticulosis    Duodenal diverticulum, periampullary 11/03/2018   Aborted ERCP 2016   GERD (gastroesophageal reflux disease)    History of hiatal hernia    Hypertension    Kidney disease, chronic, stage III (GFR 30-59 ml/min) (HCC)    Obesity    Osteopenia    Seizures ([place]) 22 yrs ago   no cause found   Tubular adenoma of colon 2009   Assessment: AC/Heme:  - hx provoked submassive PE in Dec 2022. She was on on Eliquis but last filled date was 09/07/21 -- stopped after 6 mo of txment - CHA2DS2-VASc score of 6  - No surgical intervention for knee required.  Goal of Therapy:  Therapeutic oral anticoagulation   Plan:  -  Copay for Eliquis is $47.00 - was on Eliquis before so can't use the the free coupon again++ - Start Eliquis 2.'5mg'$  BID for age>80 and Scr >1.5 - D/c Lovenox   Jaymari Cromie S. Alford Highland, PharmD,  BCPS Clinical Staff Pharmacist [email or web address] Alford Highland, [place] 12/30/2021,6:52 PM

## 2021-12-30 NOTE — Consult Note (Signed)
ORTHOPAEDIC CONSULTATION  REQUESTING PHYSICIAN: Patrecia Pour, MD  PCP:  Orpah Melter, MD  Chief Complaint: Fall, left knee pain  HPI: Pamela [name] is a 87 y.o. female with medical history significant of hypertension, T2DM, HFrEF, CKD stage IIIb, hyperlipidemia and pulmonary embolism who presents to the emergency department via EMS due to left knee pain after sustaining a fall at home.  Patient states that she was going to the bathroom and she was ambulating with a walker, she then felt her legs shaking and weak and she sustained a fall landing on the left knee with subsequent knee pain.  EMS was activated and she was helped from the floor and was taken to the ED for further evaluation and management. Patient also complained of 3-day onset of cough with thick light yellow phlegm, but denies fever, chills, headache, nausea, vomiting.    ED course: Chest x-ray showed mild bilateral heterogeneous pulmonary opacities, possibly due to pulmonary edema.She was treated with IV ceftriaxone and azithromycin. Hospitalist was asked to admit patient for further evaluation and management.   Orthopedics was consulted for her left knee pain. Knee xray and CT scan showed advanced and progressive tricompartmental's arthritis. Small joint effusion with possible intra-articular loose bodies. No fracture or dislocation was seen. She reports getting knee cortisone injections from [facility] with her last injection 09/27/21.    Past Medical History:  Diagnosis Date   Acute diastolic CHF (congestive heart failure) ([place]) 10/11/2019   Arthritis    Bilateral lower extremity edema    left lower extremity cellulitis   Common bile duct stone 06/22/2017   Diabetes ([place])    Diverticulosis    Duodenal diverticulum, periampullary 11/03/2018   Aborted ERCP 2016   GERD (gastroesophageal reflux disease)    History of hiatal hernia    Hypertension    Kidney disease, chronic, stage III (GFR 30-59 ml/min) (HCC)     Obesity    Osteopenia    Seizures ([place]) 22 yrs ago   no cause found   Tubular adenoma of colon 2009   Past Surgical History:  Procedure Laterality Date   ABDOMINAL HYSTERECTOMY     APPENDECTOMY     CHOLECYSTECTOMY OPEN  1977   COLONOSCOPY WITH PROPOFOL N/A 10/16/2014   Procedure: COLONOSCOPY WITH PROPOFOL;  Surgeon: [name], MD;  Location: [facility] ENDOSCOPY;  Service: Endoscopy;  Laterality: N/A;   ENDOSCOPIC RETROGRADE CHOLANGIOPANCREATOGRAPHY (ERCP) WITH PROPOFOL N/A 06/02/2017   Procedure: ENDOSCOPIC RETROGRADE CHOLANGIOPANCREATOGRAPHY (ERCP) WITH PROPOFOL;  Surgeon: [name], MD;  Location: [facility] ENDOSCOPY;  Service: Endoscopy;  Laterality: N/A;   TONSILLECTOMY     Social History   Socioeconomic History   Marital status: Married    Spouse name: Not on file   Number of children: Not on file   Years of education: Not on file   Highest education level: Not on file  Occupational History   Not on file  Tobacco Use   Smoking status: Former    Packs/day: 1.00    Years: 20.00    Total pack years: 20.00    Types: Cigarettes   Smokeless tobacco: Never   Tobacco comments:    quit smoking 30 yrs ago  Vaping Use   Vaping Use: Never used  Substance and Sexual Activity   Alcohol use: No   Drug use: No   Sexual activity: Not on file  Other Topics Concern   Not on file  Social History Narrative   Not on file  Social Determinants of Health   Financial Resource Strain: Not on file  Food Insecurity: Not on file  Transportation Needs: Not on file  Physical Activity: Not on file  Stress: Not on file  Social Connections: Not on file   Family History  Problem Relation Age of Onset   Hypertension Brother    Allergies  Allergen Reactions   Levaquin [Levofloxacin In D5w] Nausea And Vomiting   Lisinopril Cough   Prior to Admission medications   Medication Sig Start Date End Date Taking? Authorizing Provider  acetaminophen (TYLENOL) 500 MG tablet Take 1 tablet (500  mg total) by mouth every 6 (six) hours as needed for moderate pain. 11/07/18  Yes Sheikh, Omair Latif, DO  calcium carbonate (OSCAL) 1500 (600 Ca) MG TABS tablet Take 1,500 mg by mouth 2 (two) times daily with a meal.   Yes [provider]  dapagliflozin propanediol (FARXIGA) 10 MG TABS tablet Take 10 mg by mouth daily.   Yes [provider]  Lancets (ONETOUCH DELICA PLUS [id number]) [place]  07/09/21  Yes [provider]  lovastatin (MEVACOR) 20 MG tablet Take 20 mg by mouth at bedtime.   Yes [provider]  metoprolol succinate (TOPROL-XL) 25 MG 24 hr tablet Take 25 mg by mouth daily. 11/15/21  Yes [provider]  OVER THE COUNTER MEDICATION Take 1 capsule by mouth daily. Daily fiber 100% psyllium.   Yes [provider]  Probiotic Product (ALIGN) 4 MG CAPS Take 4 mg by mouth daily.   Yes [provider]  traZODone (DESYREL) 50 MG tablet Take 50 mg by mouth at bedtime as needed for sleep.   Yes [provider]  fluticasone (FLONASE) 50 MCG/ACT nasal spray Place 1 spray into both nostrils daily. Patient not taking: Reported on 12/28/2021 08/11/21   Hunsucker, [name], MD  furosemide (LASIX) 20 MG tablet Take 1 tablet (20 mg total) by mouth daily as needed for edema (or weight gain). Patient not taking: Reported on 12/28/2021 05/06/21 05/06/22  Ezekiel Slocumb, DO   CT KNEE LEFT WO CONTRAST  Result Date: 12/30/2021 CLINICAL DATA:  Left knee pain EXAM: CT OF THE LEFT KNEE WITHOUT CONTRAST TECHNIQUE: Multidetector CT imaging of the left knee was performed according to the standard protocol. Multiplanar CT image reconstructions were also generated. RADIATION DOSE REDUCTION: This exam was performed according to the departmental dose-optimization program which includes automated exposure control, adjustment of the mA and/or kV according to patient size and/or use of iterative reconstruction technique. COMPARISON:  X-ray 12/28/2021 FINDINGS:  Bones/Joint/Cartilage No acute fracture or dislocation. Severe tricompartmental osteoarthritis of the left knee with bone-on-bone apposition in the medial compartment. Bulky tricompartmental marginal osteophytes. Small joint effusion with several small intra-articular loose bodies within the suprapatellar pouch. Bones are demineralized. Ligaments Suboptimally assessed by CT. Muscles and Tendons Generalized muscle atrophy. No acute musculotendinous abnormality by CT. Soft tissues Focal soft tissue swelling and edema within the infrapatellar subcutaneous soft tissues. Scattered vascular calcifications. IMPRESSION: 1. No acute fracture or dislocation of the left knee. 2. Severe tricompartmental osteoarthritis of the left knee. 3. Small joint effusion and several small intra-articular loose bodies. Electronically Signed   By: Davina Poke D.O.   On: 12/30/2021 10:02   ECHOCARDIOGRAM COMPLETE  Result Date: 12/30/2021    ECHOCARDIOGRAM REPORT   Patient Name:   Pamela [name] Date of Exam: 12/29/2021 Medical Rec #:  [id number]      Height:       62.0 in Accession #:  [id number]     Weight:       269.0 lb Date of Birth:  1935-01-26       BSA:          2.168 m Patient Age:    65 years       BP:           116/81 mmHg Patient Gender: F              HR:           70 bpm. Exam Location:  Inpatient Procedure: 2D Echo, Color Doppler and Cardiac Doppler                              MODIFIED REPORT: This report was modified by Candee Furbish MD on 12/30/2021 due to Reduced EF.  Indications:     CHF- acute diastolic  History:         Patient has prior history of Echocardiogram examinations, most                  recent 08/02/2021. HFrEF; Risk Factors:Hypertension, Diabetes                  and HLD.  Sonographer:     Joette Catching RCS Referring Phys:  [id number] OLADAPO ADEFESO Diagnosing Phys: Candee Furbish MD  Sonographer Comments: Study done supine. IMPRESSIONS  1. Left ventricular ejection fraction, by estimation, is 45 to 50%. The  left ventricle has mildly decreased function. The left ventricle demonstrates global hypokinesis. There is mild left ventricular hypertrophy. Left ventricular diastolic parameters are consistent with Grade II diastolic dysfunction (pseudonormalization).  2. Right ventricular systolic function is normal. The right ventricular size is normal.  3. The mitral valve is normal in structure. No evidence of mitral valve regurgitation. No evidence of mitral stenosis. Moderate mitral annular calcification.  4. The aortic valve is normal in structure. Aortic valve regurgitation is not visualized. No aortic stenosis is present.  5. The inferior vena cava is normal in size with greater than 50% respiratory variability, suggesting right atrial pressure of 3 mmHg. FINDINGS  Left Ventricle: Left ventricular ejection fraction, by estimation, is 45 to 50%. The left ventricle has mildly decreased function. The left ventricle demonstrates global hypokinesis. The left ventricular internal cavity size was normal in size. There is  mild left ventricular hypertrophy. Left ventricular diastolic parameters are consistent with Grade II diastolic dysfunction (pseudonormalization). Right Ventricle: The right ventricular size is normal. No increase in right ventricular wall thickness. Right ventricular systolic function is normal. Left Atrium: Left atrial size was normal in size. Right Atrium: Right atrial size was normal in size. Pericardium: There is no evidence of pericardial effusion. Mitral Valve: The mitral valve is normal in structure. Moderate mitral annular calcification. No evidence of mitral valve regurgitation. No evidence of mitral valve stenosis. Tricuspid Valve: The tricuspid valve is normal in structure. Tricuspid valve regurgitation is mild . No evidence of tricuspid stenosis. Aortic Valve: The aortic valve is normal in structure. Aortic valve regurgitation is not visualized. No aortic stenosis is present. Aortic valve mean  gradient measures 5.0 mmHg. Aortic valve peak gradient measures 8.8 mmHg. Aortic valve area, by VTI measures 2.30 cm. Pulmonic Valve: The pulmonic valve was normal in structure. Pulmonic valve regurgitation is trivial. No evidence of pulmonic stenosis. Aorta: The aortic root is normal in size and structure. Venous: The inferior vena cava is normal in size with  greater than 50% respiratory variability, suggesting right atrial pressure of 3 mmHg. IAS/Shunts: No atrial level shunt detected by color flow Doppler.  LEFT VENTRICLE PLAX 2D LVIDd:         3.70 cm   Diastology LVIDs:         2.70 cm   LV e' medial:    5.22 cm/s LV PW:         1.30 cm   LV E/e' medial:  18.0 LV IVS:        1.30 cm   LV e' lateral:   8.92 cm/s LVOT diam:     2.00 cm   LV E/e' lateral: 10.5 LV SV:         75 LV SV Index:   34 LVOT Area:     3.14 cm  RIGHT VENTRICLE            IVC RV Basal diam:  2.80 cm    IVC diam: 2.20 cm RV Mid diam:    1.20 cm RV S prime:     9.68 cm/s TAPSE (M-mode): 1.5 cm LEFT ATRIUM             Index        RIGHT ATRIUM           Index LA diam:        3.60 cm 1.66 cm/m   RA Area:     13.10 cm LA Vol (A2C):   31.8 ml 14.67 ml/m  RA Volume:   30.40 ml  14.02 ml/m LA Vol (A4C):   57.9 ml 26.70 ml/m LA Biplane Vol: 44.0 ml 20.29 ml/m  AORTIC VALVE                     PULMONIC VALVE AV Area (Vmax):    2.53 cm      PV Vmax:          1.15 m/s AV Area (Vmean):   2.32 cm      PV Peak grad:     5.3 mmHg AV Area (VTI):     2.30 cm      PR End Diast Vel: 6.35 msec AV Vmax:           148.00 cm/s AV Vmean:          112.000 cm/s AV VTI:            0.325 m AV Peak Grad:      8.8 mmHg AV Mean Grad:      5.0 mmHg LVOT Vmax:         119.00 cm/s LVOT Vmean:        82.600 cm/s LVOT VTI:          0.238 m LVOT/AV VTI ratio: 0.73  AORTA Ao Root diam: 2.90 cm Ao Asc diam:  3.40 cm MITRAL VALVE               TRICUSPID VALVE MV Area (PHT): 2.57 cm    TR Peak grad:   28.3 mmHg MV Decel Time: 296 msec    TR Vmax:        266.00 cm/s MV E  velocity: 94.00 cm/s MV A velocity: 41.93 cm/s  SHUNTS MV E/A ratio:  2.24        Systemic VTI:  0.24 m                            Systemic Diam:  2.00 cm Candee Furbish MD Electronically signed by Candee Furbish MD Signature Date/Time: 12/29/2021/2:37:29 PM    Final (Updated)     Positive ROS: All other systems have been reviewed and were otherwise negative with the exception of those mentioned in the HPI and as above.  Physical Exam: General: Alert, no acute distress, but does appear anxious. Cardiovascular: Mild pedal edema Respiratory: No cyanosis, no use of accessory musculature GI: No organomegaly, abdomen is soft and non-tender Skin: No lesions in the area of chief complaint Neurologic: Sensation intact distally Psychiatric: Patient is competent for consent with normal mood and affect Lymphatic: No axillary or cervical lymphadenopathy  MUSCULOSKELETAL: Examination of the left knee reveals no skin wounds or lesions. Small contusion where patient fell on knee. Pain over medial and lateral joint lines and with movement of the patella. No ligamentous instability noted. ROM limited due to pain during examination. Mild swelling noted over left knee.  Sensation intact in LE bilaterally. Motor function intact in LE bilaterally including plantar flexion, dorsiflexion, and EHL. Distal pedal pulses 2+ bilaterally.  Assessment: Advanced and progressive tricompartmental's arthritis of the left knee. Small joint effusion  Plan: Patient was found to have severe bone on bone arthritis of the left knee. X-ray and CT scan ruled out fracture or dislocation with small joint effusion noted. Informed consent was obtained for aspiration of the left knee to help alleviate knee pain. Patient's knee was prepped sterilely and aspiration of the left knee was attempted x2 but no fluid was removed from the knee. Patient was offered cortisone injection to help alleviate some of her pain. Patient elected to proceed. Her last  left knee cortisone injection was 09/27/21. Cortisone injection with lidocaine, marcaine, and depomedrol was prepared and injected sterilely into the left knee joint. Sterile bandage and acewrap was placed on knee. Patient instructed to ice and elevate today and tomorrow. Patient can follow-up PRN knee pain to orthopedics. Charlott Rakes, PA-C    12/30/2021 3:42 PM

## 2021-12-30 NOTE — Discharge Instructions (Signed)

## 2021-12-30 NOTE — Progress Notes (Signed)
Progress Note  Patient: Pamela [name] LGX:[id number] DOB: 24-Nov-1934  DOA: 12/28/2021  DOS: 12/30/2021    Brief hospital course: TAJAI SUDER is an 87 y.o. female with a history of PE in setting of covid-19 infection, chronic HFrEF, stage IIIb CKD, HTN, HLD, T2DM who fell at home with subsequent left knee pain.  She was hypertensive to 214/104, tachypneic and hypoxemic requiring 2L O2. CBC was normal, SCr 1.47 (baseline 1.3-1.4). BNP 216.2, D-dimer 0.69, lactic acid negative x2, troponin negative x2. Bilateral opacities noted on CXR favoring pulmonary edema. Left knee XR revealed severe, progressive osteoarthritis with joint effusion and possible intra-articular loose bodies.  IV lasix started for acute HFrEF causing acute hypoxic respiratory failure. Antibiotics initially given though pneumonia is felt to be ruled out. She converted to rapid AFib on 6/28 which converted with IV metoprolol. Cardiology is consulted. Orthopedics is consulted for left knee abnormalities including effusion and intraarticular loose bodies confirmed on CT.  Assessment and Plan: Acute on chronic HFrEF: LVEF is stable, 45-50%, based on repeat echo. Discussed with Dr. Oval Linsey.  - Euvolemic after IV lasix, creatinine bump noted, stopped lasix. Monitor I/O (-1.5L) and daily weights (down 122 > 120kg, suspected EDW) - With AKI, will hold dapagliflozin - Continue metoprolol succinate - Initiation of further GDMT limited by renal impairment at this time.   Atrial fibrillation with RVR: Asymptomatic, so unclear when this first occurred, initially noted the day after admission. TSH 3.013.  - Given hx LV systolic dysfunction, not using CCB. Continue metoprolol.  - CHA2DS2-VASc score is 6, pt has tolerated eliquis in the past, anticipate starting this once cleared by orthopedics to do so. This was discussed with pt and spouse.   HTN urgency: Pain contributing, but still uncontrolled. - Continue increased metoprolol  succinate - ARB/spiro unable to start in setting of AKI, so starting hydralazine - Pain control as below  Left knee OA and effusion: Concern for loose bodies in the joint by XR and CT. - Orthopedics consulted, seem to be planning arthrocentesis.  - Multimodal pain control.  AKI on stage IIIb CKD: Cr up with diuresis.  - Stopping lasix, consider further work up if not improving.  - Avoid nephrotoxins - Renally dose medications. Restarted dapagliflozin at lower dose, but will hold this for now with AKI.   Pneumonia felt to be ruled out as pt is afebrile without cough or leukocytosis on arrival. PCT undetectable. We will monitor cultures off antibiotics  Well-controlled NIDT2DM: HbA1c 6.1%.  - SSI  HLD:  - Continue statin  History of provoked PE (covid-19 infection): Has completed anticoagulation. Did not have bleeding complications. Age-adjusted d-dimer is negative here.   Insomnia:  - Reordered home trazodone  Morbid obesity: Estimated body mass index is 48.51 kg/m as calculated from the following:   Height as of this encounter: '5\' 2"'$  (1.575 m).   Weight as of this encounter: 120.3 kg.  Subjective: No chest pain or dyspnea, primary issue is severe left knee pain with movement or palpation, none at rest. Has not changed appreciably. Strongly prefers to return home at discharge.   Objective: Vitals:   12/30/21 0400 12/30/21 0438 12/30/21 1034 12/30/21 1315  BP: (!) 154/88  (!) 181/72 133/65  Pulse: 64  68 75  Resp: '14  20 20  '$ Temp: 98.8 F (37.1 C)   98.7 F (37.1 C)  TempSrc: Oral   Oral  SpO2: 95%  94% 95%  Weight:  120.3 kg    Height:  Gen: Pleasant, obese, elderly female in no distress Pulm: Nonlabored breathing ambient air without crackles or wheezes. CV: Regular rate and rhythm. No murmur, rub, or gallop. No JVD, no pitting dependent edema. GI: Abdomen soft, non-tender, non-distended, with normoactive bowel sounds.  Ext: Left knee without significant  erythema, tender to palpation with bony hypertrophy. Right knee modestly tender to palpation with similar bony changes, no palpable effusion on right. Skin: No new rashes, lesions or ulcers on visualized skin. Neuro: Alert and oriented. No focal neurological deficits. Psych: Judgement and insight appear fair. Mood euthymic & affect congruent. Behavior is appropriate.    Data Personally reviewed: CBC: Recent Labs  Lab 12/28/21 1151 12/29/21 0347 12/30/21 0359  WBC 10.0 12.3* 10.9*  NEUTROABS 7.9*  --   --   HGB 14.3 16.3* 14.4  HCT 44.3 50.3* 44.8  MCV 96.5 98.1 100.4*  PLT 172 177 270   Basic Metabolic Panel: Recent Labs  Lab 12/28/21 1151 12/29/21 0347 12/30/21 0359  NA 141 142 138  K 4.1 3.4* 4.3  CL 105 104 102  CO2 '28 27 27  '$ GLUCOSE 99 119* 89  BUN 29* 25* 37*  CREATININE 1.47* 1.34* 2.22*  CALCIUM 10.0 8.9 8.3*  MG  --  1.9 2.2  PHOS  --  2.9  --    GFR: Estimated Creatinine Clearance: 22.9 mL/min (A) (by C-G formula based on SCr of 2.22 mg/dL (H)). Liver Function Tests: Recent Labs  Lab 12/28/21 1151 12/29/21 0347  AST 12* 18  ALT 11 15  ALKPHOS 52 55  BILITOT 0.9 1.2  PROT 7.2 7.6  ALBUMIN 4.0 3.9   No results for input(s): "LIPASE", "AMYLASE" in the last 168 hours. No results for input(s): "AMMONIA" in the last 168 hours. Coagulation Profile: No results for input(s): "INR", "PROTIME" in the last 168 hours. Cardiac Enzymes: No results for input(s): "CKTOTAL", "CKMB", "CKMBINDEX", "TROPONINI" in the last 168 hours. BNP (last 3 results) No results for input(s): "PROBNP" in the last 8760 hours. HbA1C: Recent Labs    12/29/21 0116  HGBA1C 5.7*   CBG: Recent Labs  Lab 12/29/21 1632 12/29/21 1746 12/29/21 2058 12/30/21 0737 12/30/21 1158  GLUCAP 132* 146* 116* 89 83   Lipid Profile: No results for input(s): "CHOL", "HDL", "LDLCALC", "TRIG", "CHOLHDL", "LDLDIRECT" in the last 72 hours. Thyroid Function Tests: Recent Labs    12/29/21 1303   TSH 3.013  FREET4 0.99   Anemia Panel: No results for input(s): "VITAMINB12", "FOLATE", "FERRITIN", "TIBC", "IRON", "RETICCTPCT" in the last 72 hours. Urine analysis:    Component Value Date/Time   COLORURINE YELLOW 10/09/2019 1517   APPEARANCEUR CLEAR 10/09/2019 1517   LABSPEC 1.010 10/09/2019 1517   PHURINE 6.0 10/09/2019 1517   GLUCOSEU NEGATIVE 10/09/2019 1517   HGBUR TRACE (A) 10/09/2019 1517   BILIRUBINUR NEGATIVE 10/09/2019 [place] 10/09/2019 1517   PROTEINUR 30 (A) 10/09/2019 1517   NITRITE NEGATIVE 10/09/2019 1517   LEUKOCYTESUR NEGATIVE 10/09/2019 1517   Recent Results (from the past 240 hour(s))  Blood culture (routine x 2)     Status: None (Preliminary result)   Collection Time: 12/28/21 11:50 AM   Specimen: BLOOD RIGHT FOREARM  Result Value Ref Range Status   Specimen Description   Final    BLOOD RIGHT FOREARM Performed at [facility], [address] [place], [place] [address]    Special Requests   Final    BOTTLES DRAWN AEROBIC AND ANAEROBIC Blood Culture adequate volume Performed at [facility], [address], [place], [place] [address]    Culture   Final    NO GROWTH 2 DAYS Performed at [facility], [place] [address] [place], [place] [address]    Report Status PENDING  Incomplete  SARS Coronavirus 2 by RT PCR (hospital order, performed in [facility] hospital lab) *cepheid single result test* Anterior Nasal Swab     Status: None   Collection Time: 12/28/21 11:51 AM   Specimen: Anterior Nasal Swab  Result Value Ref Range Status   SARS Coronavirus 2 by RT PCR NEGATIVE NEGATIVE Final    Comment: (NOTE) SARS-CoV-2 target nucleic acids are NOT DETECTED.  The SARS-CoV-2 RNA is generally detectable in upper and lower respiratory specimens during the acute phase of infection. The lowest concentration of SARS-CoV-2 viral copies this assay can detect is 250 copies / mL. A negative result does  not preclude SARS-CoV-2 infection and should not be used as the sole basis for treatment or other patient management decisions.  A negative result may occur with improper specimen collection / handling, submission of specimen other than nasopharyngeal swab, presence of viral mutation(s) within the areas targeted by this assay, and inadequate number of viral copies (<250 copies / mL). A negative result must be combined with clinical observations, patient history, and epidemiological information.  Fact Sheet for Patients:   https://www.patel.info/  Fact Sheet for Healthcare Providers: https://hall.com/  This test is not yet approved or  cleared by the [place] FDA and has been authorized for detection and/or diagnosis of SARS-CoV-2 by FDA under an Emergency Use Authorization (EUA).  This EUA will remain in effect (meaning this test can be used) for the duration of the COVID-19 declaration under Section 564(b)(1) of the Act, 21 U.S.C. section 360bbb-3(b)(1), unless the authorization is terminated or revoked sooner.  Performed at [facility], [address] [place], [place] [address]   Blood culture (routine x 2)     Status: None (Preliminary result)   Collection Time: 12/28/21  7:22 PM   Specimen: BLOOD  Result Value Ref Range Status   Specimen Description   Final    BLOOD BLOOD LEFT HAND Performed at [facility] [address], [place], [place] [address]    Special Requests   Final    IN PEDIATRIC BOTTLE Blood Culture adequate volume Performed at [facility] [address] [place], [place] [address]    Culture   Final    NO GROWTH 2 DAYS Performed at [facility] [address] [place], [place] [address]    Report Status PENDING  Incomplete     CT KNEE LEFT WO CONTRAST  Result Date: 12/30/2021 CLINICAL DATA:  Left knee pain EXAM: CT OF THE LEFT KNEE  WITHOUT CONTRAST TECHNIQUE: Multidetector CT imaging of the left knee was performed according to the standard protocol. Multiplanar CT image reconstructions were also generated. RADIATION DOSE REDUCTION: This exam was performed according to the departmental dose-optimization program which includes automated exposure control, adjustment of the mA and/or kV according to patient size and/or use of iterative reconstruction technique. COMPARISON:  X-ray 12/28/2021 FINDINGS: Bones/Joint/Cartilage No acute fracture or dislocation. Severe tricompartmental osteoarthritis of the left knee with bone-on-bone apposition in the medial compartment. Bulky tricompartmental marginal osteophytes. Small joint effusion with several small intra-articular loose bodies within the suprapatellar pouch. Bones are demineralized. Ligaments Suboptimally assessed by CT. Muscles and Tendons Generalized muscle atrophy. No acute musculotendinous abnormality by CT. Soft  tissues Focal soft tissue swelling and edema within the infrapatellar subcutaneous soft tissues. Scattered vascular calcifications. IMPRESSION: 1. No acute fracture or dislocation of the left knee. 2. Severe tricompartmental osteoarthritis of the left knee. 3. Small joint effusion and several small intra-articular loose bodies. Electronically Signed   By: Davina Poke D.O.   On: 12/30/2021 10:02   ECHOCARDIOGRAM COMPLETE  Result Date: 12/30/2021    ECHOCARDIOGRAM REPORT   Patient Name:   NEOMI LAIDLER Date of Exam: 12/29/2021 Medical Rec #:  [id number]      Height:       62.0 in Accession #:    [id number]     Weight:       269.0 lb Date of Birth:  03/11/1935       BSA:          2.168 m Patient Age:    54 years       BP:           116/81 mmHg Patient Gender: F              HR:           70 bpm. Exam Location:  Inpatient Procedure: 2D Echo, Color Doppler and Cardiac Doppler                              MODIFIED REPORT: This report was modified by Candee Furbish MD on 12/30/2021 due to  Reduced EF.  Indications:     CHF- acute diastolic  History:         Patient has prior history of Echocardiogram examinations, most                  recent 08/02/2021. HFrEF; Risk Factors:Hypertension, Diabetes                  and HLD.  Sonographer:     Joette Catching RCS Referring Phys:  [id number] OLADAPO ADEFESO Diagnosing Phys: Candee Furbish MD  Sonographer Comments: Study done supine. IMPRESSIONS  1. Left ventricular ejection fraction, by estimation, is 45 to 50%. The left ventricle has mildly decreased function. The left ventricle demonstrates global hypokinesis. There is mild left ventricular hypertrophy. Left ventricular diastolic parameters are consistent with Grade II diastolic dysfunction (pseudonormalization).  2. Right ventricular systolic function is normal. The right ventricular size is normal.  3. The mitral valve is normal in structure. No evidence of mitral valve regurgitation. No evidence of mitral stenosis. Moderate mitral annular calcification.  4. The aortic valve is normal in structure. Aortic valve regurgitation is not visualized. No aortic stenosis is present.  5. The inferior vena cava is normal in size with greater than 50% respiratory variability, suggesting right atrial pressure of 3 mmHg. FINDINGS  Left Ventricle: Left ventricular ejection fraction, by estimation, is 45 to 50%. The left ventricle has mildly decreased function. The left ventricle demonstrates global hypokinesis. The left ventricular internal cavity size was normal in size. There is  mild left ventricular hypertrophy. Left ventricular diastolic parameters are consistent with Grade II diastolic dysfunction (pseudonormalization). Right Ventricle: The right ventricular size is normal. No increase in right ventricular wall thickness. Right ventricular systolic function is normal. Left Atrium: Left atrial size was normal in size. Right Atrium: Right atrial size was normal in size. Pericardium: There is no evidence of pericardial  effusion. Mitral Valve: The mitral valve is normal in structure. Moderate mitral annular calcification. No evidence of mitral  valve regurgitation. No evidence of mitral valve stenosis. Tricuspid Valve: The tricuspid valve is normal in structure. Tricuspid valve regurgitation is mild . No evidence of tricuspid stenosis. Aortic Valve: The aortic valve is normal in structure. Aortic valve regurgitation is not visualized. No aortic stenosis is present. Aortic valve mean gradient measures 5.0 mmHg. Aortic valve peak gradient measures 8.8 mmHg. Aortic valve area, by VTI measures 2.30 cm. Pulmonic Valve: The pulmonic valve was normal in structure. Pulmonic valve regurgitation is trivial. No evidence of pulmonic stenosis. Aorta: The aortic root is normal in size and structure. Venous: The inferior vena cava is normal in size with greater than 50% respiratory variability, suggesting right atrial pressure of 3 mmHg. IAS/Shunts: No atrial level shunt detected by color flow Doppler.  LEFT VENTRICLE PLAX 2D LVIDd:         3.70 cm   Diastology LVIDs:         2.70 cm   LV e' medial:    5.22 cm/s LV PW:         1.30 cm   LV E/e' medial:  18.0 LV IVS:        1.30 cm   LV e' lateral:   8.92 cm/s LVOT diam:     2.00 cm   LV E/e' lateral: 10.5 LV SV:         75 LV SV Index:   34 LVOT Area:     3.14 cm  RIGHT VENTRICLE            IVC RV Basal diam:  2.80 cm    IVC diam: 2.20 cm RV Mid diam:    1.20 cm RV S prime:     9.68 cm/s TAPSE (M-mode): 1.5 cm LEFT ATRIUM             Index        RIGHT ATRIUM           Index LA diam:        3.60 cm 1.66 cm/m   RA Area:     13.10 cm LA Vol (A2C):   31.8 ml 14.67 ml/m  RA Volume:   30.40 ml  14.02 ml/m LA Vol (A4C):   57.9 ml 26.70 ml/m LA Biplane Vol: 44.0 ml 20.29 ml/m  AORTIC VALVE                     PULMONIC VALVE AV Area (Vmax):    2.53 cm      PV Vmax:          1.15 m/s AV Area (Vmean):   2.32 cm      PV Peak grad:     5.3 mmHg AV Area (VTI):     2.30 cm      PR End Diast Vel: 6.35  msec AV Vmax:           148.00 cm/s AV Vmean:          112.000 cm/s AV VTI:            0.325 m AV Peak Grad:      8.8 mmHg AV Mean Grad:      5.0 mmHg LVOT Vmax:         119.00 cm/s LVOT Vmean:        82.600 cm/s LVOT VTI:          0.238 m LVOT/AV VTI ratio: 0.73  AORTA Ao Root diam: 2.90 cm Ao Asc diam:  3.40 cm MITRAL  VALVE               TRICUSPID VALVE MV Area (PHT): 2.57 cm    TR Peak grad:   28.3 mmHg MV Decel Time: 296 msec    TR Vmax:        266.00 cm/s MV E velocity: 94.00 cm/s MV A velocity: 41.93 cm/s  SHUNTS MV E/A ratio:  2.24        Systemic VTI:  0.24 m                            Systemic Diam: 2.00 cm Candee Furbish MD Electronically signed by Candee Furbish MD Signature Date/Time: 12/29/2021/2:37:29 PM    Final (Updated)     Family Communication: Husband at bedside this AM  Disposition: Status is: Inpatient Remains inpatient appropriate because: Continue management of acute CHF and AFib with RVR Planned Discharge Destination: Skilled nursing facility if continues to have restricted mobility. Pt strongly prefers home. Will have PT reevaluate after treatment of left knee pain.  Patrecia Pour, MD 12/30/2021 2:18 PM Page by [email or web address]

## 2021-12-30 NOTE — TOC Benefit Eligibility Note (Signed)
Patient Advocate Encounter ? ?Insurance verification completed.   ? ?The patient is currently admitted and upon discharge could be taking Eliquis 5 mg. ? ?The current 30 day co-pay is, $47.00.  ? ?The patient is insured through [employer] Medicare Part D  ? ? ? ?Griffin Gerrard, CPhT ?Pharmacy Patient Advocate Specialist ?[facility] Patient Advocate Team ?Direct Number: [phone]  Fax: [phone] ? ? ? ? ? ?  ?

## 2021-12-30 NOTE — NC FL2 (Signed)
[place] LEVEL OF CARE SCREENING TOOL     IDENTIFICATION  Patient Name: Pamela [name] Birthdate: 1934/11/01 Sex: female Admission Date (Current Location): 12/28/2021  [facility] and [place] Number:  Herbalist and Address:  [facility],  [place], [place]      Provider Number: [id number]  Attending Physician Name and Address:  Patrecia Pour, MD  Relative Name and Phone Number:       Current Level of Care: Hospital Recommended Level of Care: [place] Prior Approval Number:    Date Approved/Denied:   PASRR Number: [id number]  Discharge Plan: SNF    Current Diagnoses: Patient Active Problem List   Diagnosis Date Noted   Acute on chronic systolic CHF (congestive heart failure) ([place]) 12/29/2021   Hypertensive emergency 12/29/2021   CAP (community acquired pneumonia) 12/29/2021   Left knee pain 12/29/2021   Fall at home, initial encounter 12/29/2021   Elevated d-dimer 12/29/2021   PAF (paroxysmal atrial fibrillation) ([place])    Acute CHF (congestive heart failure) ([place]) 12/28/2021   Pulmonary embolism ([place]) 05/01/2021   Bilateral pulmonary embolism ([place]) 04/30/2021   Acute pulmonary embolism ([place])    COVID-19 virus infection    Acute diastolic CHF (congestive heart failure) ([place]) 10/11/2019   Pulmonary edema 10/09/2019   Acute respiratory failure with hypoxia ([place]) 10/09/2019   Duodenal diverticulum, periampullary 11/03/2018   Diverticulosis of sigmoid colon 11/03/2018   Intestine with microperforation (Sigmoid vs Meckel's) 11/03/2018   Common bile duct stone 06/22/2017   Mixed hyperlipidemia 06/22/2017   CKD (chronic kidney disease) stage 3, GFR 30-59 ml/min (HCC) 06/22/2017   Chronic pain 06/22/2017   Obesity, Class III, BMI 40-49.9 (morbid obesity) ([place]) 06/22/2017   Seizures ([place]) 06/22/2017   Type 2 diabetes mellitus, without long-term current use of insulin ([place]) 06/22/2017   RUQ pain     Bloating 11/04/2014   Hx of adenomatous colonic polyps 2016    Bilateral lower extremity edema 09/10/2013   Essential hypertension 09/10/2013    Orientation RESPIRATION BLADDER Height & Weight     Self, Time, Situation, Place  Normal Continent Weight: 120.3 kg Height:  '5\' 2"'$  (157.5 cm)  BEHAVIORAL SYMPTOMS/MOOD NEUROLOGICAL BOWEL NUTRITION STATUS      Continent Diet (regular)  AMBULATORY STATUS COMMUNICATION OF NEEDS Skin   Extensive Assist Verbally Normal                       Personal Care Assistance Level of Assistance  Bathing, Feeding, Dressing Bathing Assistance: Limited assistance Feeding assistance: Limited assistance Dressing Assistance: Limited assistance     Functional Limitations Info  Sight, Hearing, Speech Sight Info: Adequate Hearing Info: Adequate Speech Info: Adequate    SPECIAL CARE FACTORS FREQUENCY  PT (By licensed PT), OT (By licensed OT)     PT Frequency: 5 x weekly OT Frequency: 5 x weekly            Contractures Contractures Info: Not present    Additional Factors Info  Code Status Code Status Info: DNR             Current Medications (12/30/2021):  This is the current hospital active medication list Current Facility-Administered Medications  Medication Dose Route Frequency Provider Last Rate Last Admin   acetaminophen (TYLENOL) tablet 650 mg  650 mg Oral Q6H PRN Adefeso, Oladapo, DO   650 mg at 12/30/21 0045   Or   acetaminophen (TYLENOL) suppository 650 mg  650 mg Rectal Q6H PRN Adefeso, Oladapo, DO       alum & mag hydroxide-simeth (MAALOX/MYLANTA) 200-200-20 MG/5ML suspension 30 mL  30 mL Oral Q6H PRN Adefeso, Oladapo, DO   30 mL at 12/30/21 0726   dapagliflozin propanediol (FARXIGA) tablet 5 mg  5 mg Oral Daily Patrecia Pour, MD       diclofenac Sodium (VOLTAREN) 1 % topical gel 2 g  2 g Topical QID Adefeso, Oladapo, DO   2 g at 12/29/21 2108   enoxaparin (LOVENOX) injection 40 mg  40 mg Subcutaneous QHS Adefeso, Oladapo,  DO   40 mg at 12/29/21 2300   hydrALAZINE (APRESOLINE) injection 10 mg  10 mg Intravenous Q6H PRN Adefeso, Oladapo, DO   10 mg at 12/28/21 2044   HYDROmorphone (DILAUDID) injection 0.5-1 mg  0.5-1 mg Intravenous Q3H PRN Kathryne Eriksson, NP   1 mg at 12/30/21 0602   hydrOXYzine (ATARAX) tablet 10 mg  10 mg Oral TID PRN Bernadette Hoit, DO   10 mg at 12/29/21 1650   insulin aspart (novoLOG) injection 0-9 Units  0-9 Units Subcutaneous TID WC Adefeso, Oladapo, DO   1 Units at 12/29/21 1650   lidocaine (PF) (XYLOCAINE) 1 % injection 30 mL  30 mL Infiltration Once Rod Can, MD       metoprolol succinate (TOPROL-XL) 24 hr tablet 50 mg  50 mg Oral QPM Vance Gather B, MD   50 mg at 12/29/21 1929   ondansetron (ZOFRAN) injection 4 mg  4 mg Intravenous Q6H PRN Adefeso, Oladapo, DO   4 mg at 12/29/21 0301   pravastatin (PRAVACHOL) tablet 20 mg  20 mg Oral q1800 Adefeso, Oladapo, DO   20 mg at 12/29/21 1650   traZODone (DESYREL) tablet 100 mg  100 mg Oral QHS PRN Patrecia Pour, MD         Discharge Medications: Please see discharge summary for a list of discharge medications.  Relevant Imaging Results:  Relevant Lab Results:   Additional Information SSN:[id number]  Leeroy Cha, RN

## 2021-12-30 NOTE — TOC Progression Note (Signed)
Transition of Care [facility]) - Progression Note    Patient Details  Name: TEDI HUGHSON MRN: [id number] Date of Birth: [birth year]  Transition of Care [facility]) CM/SW Contact  Leeroy Cha, RN Phone Number: 12/30/2021, 8:29 AM  Clinical Narrative:    Fl2 sent out to all area snf for review.  Possible dc on [id number].   Expected Discharge Plan: [place] Barriers to Discharge: Continued Medical Work up  Expected Discharge Plan and Services Expected Discharge Plan: [place]   Discharge Planning Services: CM Consult   Living arrangements for the past 2 months: Single Family Home                                       Social Determinants of Health (SDOH) Interventions    Readmission Risk Interventions    05/06/2021    4:05 PM  Readmission Risk Prevention Plan  Transportation Screening Complete  PCP or Specialist Appt within 5-7 Days Complete  Home Care Screening Complete  Medication Review (RN CM) Complete

## 2021-12-31 ENCOUNTER — Inpatient Hospital Stay (HOSPITAL_COMMUNITY): Payer: Medicare HMO

## 2021-12-31 DIAGNOSIS — I10 Essential (primary) hypertension: Secondary | ICD-10-CM | POA: Diagnosis not present

## 2021-12-31 DIAGNOSIS — I5023 Acute on chronic systolic (congestive) heart failure: Secondary | ICD-10-CM | POA: Diagnosis not present

## 2021-12-31 DIAGNOSIS — R7989 Other specified abnormal findings of blood chemistry: Secondary | ICD-10-CM | POA: Diagnosis not present

## 2021-12-31 DIAGNOSIS — W19XXXA Unspecified fall, initial encounter: Secondary | ICD-10-CM | POA: Diagnosis not present

## 2021-12-31 DIAGNOSIS — R52 Pain, unspecified: Secondary | ICD-10-CM

## 2021-12-31 DIAGNOSIS — N1832 Chronic kidney disease, stage 3b: Secondary | ICD-10-CM | POA: Diagnosis not present

## 2021-12-31 LAB — BASIC METABOLIC PANEL
Anion gap: 8 (ref 5–15)
BUN: 42 mg/dL — ABNORMAL HIGH (ref 8–23)
CO2: 29 mmol/L (ref 22–32)
Calcium: 8.4 mg/dL — ABNORMAL LOW (ref 8.9–10.3)
Chloride: 102 mmol/L (ref 98–111)
Creatinine, Ser: 1.86 mg/dL — ABNORMAL HIGH (ref 0.44–1.00)
GFR, Estimated: 26 mL/min — ABNORMAL LOW (ref >60)
Glucose, Bld: 120 mg/dL — ABNORMAL HIGH (ref 70–99)
Potassium: 4.3 mmol/L (ref 3.5–5.1)
Sodium: 139 mmol/L (ref 135–145)

## 2021-12-31 LAB — GLUCOSE, CAPILLARY
Glucose-Capillary: 129 mg/dL — ABNORMAL HIGH (ref 70–99)
Glucose-Capillary: 118 mg/dL — ABNORMAL HIGH (ref 70–99)
Glucose-Capillary: 152 mg/dL — ABNORMAL HIGH (ref 70–99)

## 2021-12-31 MED ORDER — HYDRALAZINE HCL 50 MG PO TABS
50.0000 mg | ORAL_TABLET | Freq: Three times a day (TID) | ORAL | Status: DC
  Administered 2021-12-31 – 2022-01-01 (×3): 50 mg via ORAL
  Filled 2021-12-31 (×3): qty 1

## 2021-12-31 NOTE — Care Management Important Message (Signed)
Important Message  Patient Details  Name: Pamela [name] MRN: [id number] Date of Birth: [birth year]   Medicare Important Message Given:  Yes     Memory Argue 12/31/2021, 2:30 PM

## 2021-12-31 NOTE — Progress Notes (Signed)
RLE venous duplex has been completed.   Results can be found under chart review under CV PROC. 12/31/2021 4:29 PM Daisi Kentner RVT, RDMS

## 2021-12-31 NOTE — Progress Notes (Signed)
Progress Note  Patient Name: Pamela [name] Date of Encounter: 12/31/2021  [facility] Cardiologist: None   Subjective   Feeling a little better.  Still having pain in her knee.  Inpatient Medications    Scheduled Meds:  apixaban  2.5 mg Oral BID   diclofenac Sodium  2 g Topical QID   hydrALAZINE  25 mg Oral Q8H   insulin aspart  0-9 Units Subcutaneous TID WC   metoprolol succinate  50 mg Oral QPM   pravastatin  20 mg Oral q1800   traZODone  100 mg Oral QHS   Continuous Infusions:  PRN Meds: acetaminophen **OR** acetaminophen, alum & mag hydroxide-simeth, hydrALAZINE, HYDROmorphone (DILAUDID) injection, hydrOXYzine, ondansetron (ZOFRAN) IV   Vital Signs    Vitals:   12/31/21 0248 12/31/21 0546 12/31/21 0548 12/31/21 0851  BP: (!) 153/66 (!) 114/103 (!) 171/67 (!) 154/66  Pulse: 73 67    Resp:  20    Temp:  97.9 F (36.6 C)    TempSrc:  Oral    SpO2:  98%    Weight:      Height:        Intake/Output Summary (Last 24 hours) at 12/31/2021 0954 Last data filed at 12/31/2021 0547 Gross per 24 hour  Intake 555 ml  Output 1200 ml  Net -645 ml      12/31/2021    2:34 AM 12/30/2021    4:38 AM 12/28/2021    6:00 PM  Last 3 Weights  Weight (lbs) 264 lb 1.8 oz 265 lb 3.4 oz 268 lb 15.4 oz  Weight (kg) 119.8 kg 120.3 kg 122 kg      Telemetry    Sinus rhythm.  PVCs.  Atrial tachycardia.- Personally Reviewed  ECG    Sinus rhythm.  Rate 70 bpm.  Left bundle branch block.  PVCs. - Personally Reviewed  Physical Exam   VS:  BP (!) 154/66   Pulse 67   Temp 97.9 F (36.6 C) (Oral)   Resp 20   Ht '5\' 2"'$  (1.575 m)   Wt 119.8 kg   SpO2 98%   BMI 48.31 kg/m  , BMI Body mass index is 48.31 kg/m. GENERAL:  Well appearing HEENT: Pupils equal round and reactive, fundi not visualized, oral mucosa unremarkable NECK:  No jugular venous distention, waveform within normal limits, carotid upstroke brisk and symmetric, no bruits, no thyromegaly LUNGS:  Clear to  auscultation bilaterally HEART:  RRR.  PMI not displaced or sustained,S1 and S2 within normal limits, no S3, no S4, no clicks, no rubs, no murmurs ABD:  Flat, positive bowel sounds normal in frequency in pitch, no bruits, no rebound, no guarding, no midline pulsatile mass, no hepatomegaly, no splenomegaly EXT:  2 plus pulses throughout, no edema, no cyanosis no clubbing SKIN:  No rashes no nodules NEURO:  Cranial nerves II through XII grossly intact, motor grossly intact throughout PSYCH:  Cognitively intact, oriented to person place and time   Labs    High Sensitivity Troponin:   Recent Labs  Lab 12/28/21 1151 12/28/21 1921  TROPONINIHS 15 14     Chemistry Recent Labs  Lab 12/28/21 1151 12/29/21 0347 12/30/21 0359 12/31/21 0354  NA 141 142 138 139  K 4.1 3.4* 4.3 4.3  CL 105 104 102 102  CO2 '28 27 27 29  '$ GLUCOSE 99 119* 89 120*  BUN 29* 25* 37* 42*  CREATININE 1.47* 1.34* 2.22* 1.86*  CALCIUM 10.0 8.9 8.3* 8.4*  MG  --  1.9 2.2  --   PROT 7.2 7.6  --   --   ALBUMIN 4.0 3.9  --   --   AST 12* 18  --   --   ALT 11 15  --   --   ALKPHOS 52 55  --   --   BILITOT 0.9 1.2  --   --   GFRNONAA 35* 39* 21* 26*  ANIONGAP '8 11 9 8    '$ Lipids No results for input(s): "CHOL", "TRIG", "HDL", "LABVLDL", "LDLCALC", "CHOLHDL" in the last 168 hours.  Hematology Recent Labs  Lab 12/28/21 1151 12/29/21 0347 12/30/21 0359  WBC 10.0 12.3* 10.9*  RBC 4.59 5.13* 4.46  HGB 14.3 16.3* 14.4  HCT 44.3 50.3* 44.8  MCV 96.5 98.1 100.4*  MCH 31.2 31.8 32.3  MCHC 32.3 32.4 32.1  RDW 14.6 14.6 14.0  PLT 172 177 161   Thyroid  Recent Labs  Lab 12/29/21 1303  TSH 3.013  FREET4 0.99    BNP Recent Labs  Lab 12/28/21 1151  BNP 216.2*    DDimer  Recent Labs  Lab 12/28/21 1432  DDIMER 0.69*     Radiology    CT KNEE LEFT WO CONTRAST  Result Date: 12/30/2021 CLINICAL DATA:  Left knee pain EXAM: CT OF THE LEFT KNEE WITHOUT CONTRAST TECHNIQUE: Multidetector CT imaging of the  left knee was performed according to the standard protocol. Multiplanar CT image reconstructions were also generated. RADIATION DOSE REDUCTION: This exam was performed according to the departmental dose-optimization program which includes automated exposure control, adjustment of the mA and/or kV according to patient size and/or use of iterative reconstruction technique. COMPARISON:  X-ray 12/28/2021 FINDINGS: Bones/Joint/Cartilage No acute fracture or dislocation. Severe tricompartmental osteoarthritis of the left knee with bone-on-bone apposition in the medial compartment. Bulky tricompartmental marginal osteophytes. Small joint effusion with several small intra-articular loose bodies within the suprapatellar pouch. Bones are demineralized. Ligaments Suboptimally assessed by CT. Muscles and Tendons Generalized muscle atrophy. No acute musculotendinous abnormality by CT. Soft tissues Focal soft tissue swelling and edema within the infrapatellar subcutaneous soft tissues. Scattered vascular calcifications. IMPRESSION: 1. No acute fracture or dislocation of the left knee. 2. Severe tricompartmental osteoarthritis of the left knee. 3. Small joint effusion and several small intra-articular loose bodies. Electronically Signed   By: Davina Poke D.O.   On: 12/30/2021 10:02    Cardiac Studies   Echo 12/29/21: 1. Left ventricular ejection fraction, by estimation, is 45 to 50%. The  left ventricle has mildly decreased function. The left ventricle  demonstrates global hypokinesis. There is mild left ventricular  hypertrophy. Left ventricular diastolic parameters  are consistent with Grade II diastolic dysfunction (pseudonormalization).   2. Right ventricular systolic function is normal. The right ventricular  size is normal.   3. The mitral valve is normal in structure. No evidence of mitral valve  regurgitation. No evidence of mitral stenosis. Moderate mitral annular  calcification.   4. The aortic valve  is normal in structure. Aortic valve regurgitation is  not visualized. No aortic stenosis is present.   5. The inferior vena cava is normal in size with greater than 50%  respiratory variability, suggesting right atrial pressure of 3 mmHg.   Patient Profile      Ms. Sherley is an 58F with chronic systolic and diastolic heart failure, hypertension, hyperlipidemia, diabetes, COVID-19 infection with associated pulmonary embolism (04/2021 admitted with new onset atrial fibrillation.    Assessment & Plan    #Chronic systolic  and diastolic heart failure: #Essential hypertension: LVEF this admission 45 to 50% on echo yesterday.  Essentially unchanged from prior.  She appears to be euvolemic on exam.  Renal function worsened with diuresis.  Renal function is now improving back to baseline.  Okay to resume her home 20 mg p.o. daily once renal function returns to baseline.   Blood pressure is poorly controlled.  Hydralazine was added but blood pressure still above goal.  We will increase the dose to 50 mg 3 times daily.  If her renal function improves as an outpatient could add Entresto or spironolactone.  Continue home [place].  #PAF: New onset this admission.  She converted back to sinus rhythm.  Metoprolol dose was increased.  CHA2DS2-VASc score of 6.  Start Eliquis when her knee is stable.  #Atrial tachycardia: Continue metoprolol.   We will follow from [place].  Please call if there are questions.  For questions or updates, please contact [place] Please consult [email or web address] for contact info under        Signed, Skeet Latch, MD  12/31/2021, 9:54 AM

## 2021-12-31 NOTE — Progress Notes (Signed)
Physical Therapy Treatment Patient Details Name: Pamela [name] MRN: [id number] DOB: January 10, 1935 Today's Date: 12/31/2021   History of Present Illness patient is a 87 year old female who presented from home after a fall with left knee pain.patient was admitted with acute on chronic systolic CHF, hypertensive emergency, advanced and progressive tricompartmental arthritis of L knee ad elevated D-dimer. PMH: hypertension, T2DM, HFrEF, CKD stage IIIb, hyperlipidemia and pulmonary embolism    PT Comments    General Comments: AxO x 3 very pleasant Lady and very willing to try.  Eager to go back home.  Spouse present during session.  Pt stated she uses a 3WW in house.  Stays in home "mostly".  Pt in bed on 2 lts nasal sats 94% ans HR 77.  Trial removed oxygen while monitoring.  Assisted to EOB.  General bed mobility comments: Assist for trunk and bil LEs. Increased time. General transfer comment: Did not apply KI due to poor fit.  Pt was able to self rise from an elevated bed onto walker but had difficulty weightshifting and completing 1/4 pivot turn to [facility].  Increased fear and anxiety.  Heavy use B UE's for support.  Pt was able rise from [facility] onto walker but needed + 2 assist and audible crepitus L knee. General Gait Details: VERY limited gait distance due to B knee pain (L>R), weakness and increased fear of falling/anxiety.  Recliner CLOSELY following behind. "Left knee gives way without warning".  Positioned in recliner and reapplied 2 lts as RA sats decreased to 85%. Pt wants to return home however, she is not safe to amb.  Pt will need ST Rehab at SNF.   Recommendations for follow up therapy are one component of a multi-disciplinary discharge planning process, led by the attending physician.  Recommendations may be updated based on patient status, additional functional criteria and insurance authorization.  Follow Up Recommendations  Skilled nursing-short term rehab (<3 hours/day) Can patient physically  be transported by private vehicle: No   Assistance Recommended at Discharge Frequent or constant Supervision/Assistance  Patient can return home with the following Assist for transportation;Help with stairs or ramp for entrance;Assistance with cooking/housework;A lot of help with walking and/or transfers;A lot of help with bathing/dressing/bathroom   Equipment Recommendations  None recommended by PT    Recommendations for Other Services       Precautions / Restrictions Precautions Precautions: Fall Precaution Comments: "bad" L knee Arthritis (crepitus) Restrictions Weight Bearing Restrictions: No     Mobility  Bed Mobility Overal bed mobility: Needs Assistance Bed Mobility: Supine to Sit     Supine to sit: Mod assist, +2 for physical assistance, +2 for safety/equipment, HOB elevated     General bed mobility comments: Assist for trunk and bil LEs. Increased time.    Transfers Overall transfer level: Needs assistance Equipment used: Rolling walker (2 wheels)               General transfer comment: Did not apply KI due to poor fit.  Pt was able to self rise from an elevated bed onto walker but had difficulty weightshifting and completing 1/4 pivot turn to [facility].  Increased fear and anxiety.  Heavy use B UE's for support.  Pt was able rise from [facility] onto walker but needed + 2 assist and audible crepitus L knee.    Ambulation/Gait Ambulation/Gait assistance: Max assist, +2 physical assistance, +2 safety/equipment Gait Distance (Feet): 2 Feet Assistive device: Rolling walker (2 wheels) Gait Pattern/deviations: Step-to pattern, Decreased stance time -  left, Trunk flexed Gait velocity: decreased     General Gait Details: VERY limited gait distance due to B knee pain (L>R), weakness and increased fear of falling/anxiety.  Recliner CLOSELY following behind.   Stairs             Wheelchair Mobility    Modified Rankin (Stroke Patients Only)       Balance                                             Cognition Arousal/Alertness: Awake/alert Behavior During Therapy: WFL for tasks assessed/performed Overall Cognitive Status: Within Functional Limits for tasks assessed                                 General Comments: AxO x 3 very pleasant Lady and very willing to try.  Eager to go back home.        Exercises      General Comments        Pertinent Vitals/Pain Pain Assessment Pain Assessment: Faces Faces Pain Scale: Hurts even more Pain Location: bil knees from fall + L knee Arthritis Pain Descriptors / Indicators: Discomfort, Sore, Aching, Grimacing Pain Intervention(s): Monitored during session    Home Living                          Prior Function            PT Goals (current goals can now be found in the care plan section) Progress towards PT goals: Progressing toward goals    Frequency    Min 2X/week      PT Plan Current plan remains appropriate    Co-evaluation              AM-PAC PT "6 Clicks" Mobility   Outcome Measure  Help needed turning from your back to your side while in a flat bed without using bedrails?: A Lot Help needed moving from lying on your back to sitting on the side of a flat bed without using bedrails?: A Lot Help needed moving to and from a bed to a chair (including a wheelchair)?: A Lot Help needed standing up from a chair using your arms (e.g., wheelchair or bedside chair)?: A Lot Help needed to walk in hospital room?: A Lot Help needed climbing 3-5 steps with a railing? : Total 6 Click Score: 11    End of Session Equipment Utilized During Treatment: Gait belt Activity Tolerance: Patient limited by pain;Patient limited by fatigue;Other (comment) (anxiety) Patient left: in chair;with call bell/phone within reach;with family/visitor present;with chair alarm set Nurse Communication: Mobility status PT Visit Diagnosis: Pain;Difficulty in  walking, not elsewhere classified (R26.2);Muscle weakness (generalized) (M62.81);History of falling (Z91.81) Pain - part of body: Knee     Time: 0814-4818 PT Time Calculation (min) (ACUTE ONLY): 26 min  Charges:  $Gait Training: 8-22 mins $Therapeutic Activity: 8-22 mins                     {Eli Adami  PTA Acute  [employer] M-F          [phone] Weekend pager [phone]

## 2021-12-31 NOTE — Progress Notes (Signed)
PROGRESS NOTE    Pamela [name]  KXF:[id number] DOB: [birth year] DOA: 12/28/2021 PCP: Orpah Melter, MD   Brief Narrative:   Pamela [name] is an 87 y.o. female with a history of PE in setting of covid-19 infection, chronic HFrEF, stage IIIb CKD, HTN, HLD, T2DM who fell at home with subsequent left knee pain.   She was hypertensive to 214/104, tachypneic and hypoxemic requiring 2L O2. CBC was normal, SCr 1.47 (baseline 1.3-1.4). BNP 216.2, D-dimer 0.69, lactic acid negative x2, troponin negative x2. Bilateral opacities noted on CXR favoring pulmonary edema. Left knee XR revealed severe, progressive osteoarthritis with joint effusion and possible intra-articular loose bodies.   IV lasix started for acute HFrEF causing acute hypoxic respiratory failure. Antibiotics initially given though pneumonia is felt to be ruled out. She converted to rapid AFib on 6/28 which converted with IV metoprolol. Cardiology is consulted and recommending holding diuretics still given worsened Renal Fxn. Orthopedics is consulted for left knee abnormalities including effusion and intraarticular loose bodies confirmed on CT.  Assessment and Plan:  Acute on Chronic HFrEF -LVEF is stable, 45-50%, based on repeat echo. Discussed with Dr. Oval Linsey.  - Euvolemic after IV lasix, creatinine bump noted, stopped lasix. Monitor I/O (-1.5L) and daily weights (down 122 > 120kg, suspected EDW) -With AKI, will hold dapagliflozin -Continue metoprolol succinate -Initiation of further GDMT limited by renal impairment at this time.  -She is now on hydralazine 50 mg p.o. 3 times daily -Continue to monitor volume status carefully and cardiology recommends resuming p.o. Lasix 20 mg daily once her renal function returns back to baseline   Atrial fibrillation with RVR:  -Asymptomatic, so unclear when this first occurred, initially noted the day after admission. TSH 3.013.  - Given hx LV systolic dysfunction, not using CCB. Continue  metoprolol.  - CHA2DS2-VASc score is 6, pt has tolerated eliquis in the past, anticipate starting this once cleared by orthopedics to do so. This was discussed with pt and spouse and she was initiated on Eliquis 2.5 mg p.o. twice daily.    HTN urgency:  -Pain contributing, but still uncontrolled. - Continue increased metoprolol succinate - ARB/spiro unable to start in setting of AKI, so starting hydralazine and this is increased to 50 mg p.o. 3 times daily - Pain control as below   Left knee OA and effusion: -Concern for loose bodies in the joint by XR and CT. - Orthopedics consulted, seem to be planning arthrocentesis but could not be done and instead received a steroid injection.  - Multimodal pain control. -PT OT recommending SNF and TOC assisting with placement   AKI on stage IIIb CKD:  -Cr up with diuresis.  - Stopping lasix, consider further work up if not improving.  - Avoid nephrotoxins contrast dyes, hypotension, and dehydration to ensure adequate renal perfusion and renally adjust medications - Renally dose medications.  Was going to restart dapagliflozin at lower dose, but will hold this for now with AKI.  -We will hold resuming her Lasix until renal function is back to her baseline   Pneumonia  -felt to be ruled out as pt is afebrile without cough or leukocytosis on arrival. PCT undetectable. We will monitor cultures off antibiotics   Well-controlled NIDT2DM -HbA1c 6.1%.  -SSI with sensitive NovoLog sign scale insulin AC -CBGs ranging from 83-139   HLD:  -Continue Statin   History of provoked PE (covid-19 infection):  -Has completed anticoagulation. Did not have bleeding complications. Age-adjusted D-dimer is negative here.  Insomnia:  -Reordered home trazodone  Morbid Obesity -Complicates overall prognosis and care -Estimated body mass index is 48.31 kg/m as calculated from the following:   Height as of this encounter: '5\' 2"'$  (1.575 m).   Weight as of this  encounter: 119.8 kg.  -Weight Loss and Dietary Counseling given  DVT prophylaxis: apixaban (ELIQUIS) tablet 2.5 mg Start: 12/30/21 2200 SCDs Start: 12/28/21 2303 apixaban (ELIQUIS) tablet 2.5 mg    Code Status: DNR Family Communication: Discussed with husband at bedside  Disposition Plan:  Level of care: Progressive Status is: Inpatient Remains inpatient appropriate because: Needs SNF   Consultants:  Cardiology Orthopedic surgery  Procedures:  Echo 12/29/21: 1. Left ventricular ejection fraction, by estimation, is 45 to 50%. The  left ventricle has mildly decreased function. The left ventricle  demonstrates global hypokinesis. There is mild left ventricular  hypertrophy. Left ventricular diastolic parameters  are consistent with Grade II diastolic dysfunction (pseudonormalization).   2. Right ventricular systolic function is normal. The right ventricular  size is normal.   3. The mitral valve is normal in structure. No evidence of mitral valve  regurgitation. No evidence of mitral stenosis. Moderate mitral annular  calcification.   4. The aortic valve is normal in structure. Aortic valve regurgitation is  not visualized. No aortic stenosis is present.   5. The inferior vena cava is normal in size with greater than 50%  respiratory variability, suggesting right atrial pressure of 3 mmHg.    Attempted Arthrocentesis and Cortisone Injection  Right Leg Venous Duplex  Antimicrobials:  Anti-infectives (From admission, onward)    Start     Dose/Rate Route Frequency Ordered Stop   12/28/21 1415  cefTRIAXone (ROCEPHIN) 1 g in sodium chloride 0.9 % 100 mL IVPB        1 g 200 mL/hr over 30 Minutes Intravenous  Once 12/28/21 1409 12/28/21 1449   12/28/21 1415  azithromycin (ZITHROMAX) 500 mg in sodium chloride 0.9 % 250 mL IVPB        500 mg 250 mL/hr over 60 Minutes Intravenous  Once 12/28/21 1409 12/28/21 1518       Subjective: Seen and examined at bedside and she was still  having some leg pain but staying her pain was in the right leg now.  No nausea or vomiting.  Not wanting to go to SNF but understand that she may to given she is unable to ambulate.  No chest pain or shortness of breath.  No other concerns or complaints at this time  Objective: Vitals:   12/31/21 0546 12/31/21 0548 12/31/21 0851 12/31/21 1344  BP: (!) 114/103 (!) 171/67 (!) 154/66 (!) 179/81  Pulse: 67   71  Resp: 20   20  Temp: 97.9 F (36.6 C)   97.9 F (36.6 C)  TempSrc: Oral   Oral  SpO2: 98%   93%  Weight:      Height:        Intake/Output Summary (Last 24 hours) at 12/31/2021 1733 Last data filed at 12/31/2021 0547 Gross per 24 hour  Intake 480 ml  Output 850 ml  Net -370 ml   Filed Weights   12/28/21 1800 12/30/21 0438 12/31/21 0234  Weight: 122 kg 120.3 kg 119.8 kg   Examination: Physical Exam:  Constitutional: WN/WD morbidly obese Caucasian female currently no acute distress Respiratory: Diminished to auscultation bilaterally with coarse breath sounds, no wheezing, rales, rhonchi or crackles. Normal respiratory effort and patient is not tachypenic. No accessory muscle use.  Unlabored breathing but is wearing supplemental oxygen Cardiovascular: RRR, no murmurs / rubs / gallops. S1 and S2 auscultated.  Has mild lower extremity edema worse on the right compared to left Abdomen: Soft, non-tender, distended second by habitus. Bowel sounds positive.  GU: Deferred. Musculoskeletal: No clubbing / cyanosis of digits/nails. No joint deformity upper and lower extremities.  Neurologic: CN 2-12 grossly intact with no focal deficits. Romberg sign and cerebellar reflexes not assessed.  Psychiatric: Normal judgment and insight. Alert and oriented x 3.  A little anxious  Data Reviewed: I have personally reviewed following labs and imaging studies  CBC: Recent Labs  Lab 12/28/21 1151 12/29/21 0347 12/30/21 0359  WBC 10.0 12.3* 10.9*  NEUTROABS 7.9*  --   --   HGB 14.3 16.3*  14.4  HCT 44.3 50.3* 44.8  MCV 96.5 98.1 100.4*  PLT 172 177 161   Basic Metabolic Panel: Recent Labs  Lab 12/28/21 1151 12/29/21 0347 12/30/21 0359 12/31/21 0354  NA 141 142 138 139  K 4.1 3.4* 4.3 4.3  CL 105 104 102 102  CO2 '28 27 27 29  '$ GLUCOSE 99 119* 89 120*  BUN 29* 25* 37* 42*  CREATININE 1.47* 1.34* 2.22* 1.86*  CALCIUM 10.0 8.9 8.3* 8.4*  MG  --  1.9 2.2  --   PHOS  --  2.9  --   --    GFR: Estimated Creatinine Clearance: 27.2 mL/min (A) (by C-G formula based on SCr of 1.86 mg/dL (H)). Liver Function Tests: Recent Labs  Lab 12/28/21 1151 12/29/21 0347  AST 12* 18  ALT 11 15  ALKPHOS 52 55  BILITOT 0.9 1.2  PROT 7.2 7.6  ALBUMIN 4.0 3.9   No results for input(s): "LIPASE", "AMYLASE" in the last 168 hours. No results for input(s): "AMMONIA" in the last 168 hours. Coagulation Profile: No results for input(s): "INR", "PROTIME" in the last 168 hours. Cardiac Enzymes: No results for input(s): "CKTOTAL", "CKMB", "CKMBINDEX", "TROPONINI" in the last 168 hours. BNP (last 3 results) No results for input(s): "PROBNP" in the last 8760 hours. HbA1C: Recent Labs    12/29/21 0116  HGBA1C 5.7*   CBG: Recent Labs  Lab 12/30/21 1158 12/30/21 1703 12/30/21 2211 12/31/21 0732 12/31/21 1631  GLUCAP 83 139* 134* 129* 118*   Lipid Profile: No results for input(s): "CHOL", "HDL", "LDLCALC", "TRIG", "CHOLHDL", "LDLDIRECT" in the last 72 hours. Thyroid Function Tests: Recent Labs    12/29/21 1303  TSH 3.013  FREET4 0.99   Anemia Panel: No results for input(s): "VITAMINB12", "FOLATE", "FERRITIN", "TIBC", "IRON", "RETICCTPCT" in the last 72 hours. Sepsis Labs: Recent Labs  Lab 12/28/21 1151 12/28/21 1921 12/29/21 0116  PROCALCITON  --   --  <0.10  LATICACIDVEN 1.0 1.1  --     Recent Results (from the past 240 hour(s))  Blood culture (routine x 2)     Status: None (Preliminary result)   Collection Time: 12/28/21 11:50 AM   Specimen: BLOOD RIGHT  FOREARM  Result Value Ref Range Status   Specimen Description   Final    BLOOD RIGHT FOREARM Performed at [facility], [address] [place], [place] [address]    Special Requests   Final    BOTTLES DRAWN AEROBIC AND ANAEROBIC Blood Culture adequate volume Performed at [facility], [address], [place], [place] [address]    Culture   Final    NO GROWTH 3 DAYS Performed at [facility], [place] [address] [place], [place]  [address]    Report Status PENDING  Incomplete  SARS Coronavirus 2 by RT PCR (hospital order, performed in [facility] hospital lab) *cepheid single result test* Anterior Nasal Swab     Status: None   Collection Time: 12/28/21 11:51 AM   Specimen: Anterior Nasal Swab  Result Value Ref Range Status   SARS Coronavirus 2 by RT PCR NEGATIVE NEGATIVE Final    Comment: (NOTE) SARS-CoV-2 target nucleic acids are NOT DETECTED.  The SARS-CoV-2 RNA is generally detectable in upper and lower respiratory specimens during the acute phase of infection. The lowest concentration of SARS-CoV-2 viral copies this assay can detect is 250 copies / mL. A negative result does not preclude SARS-CoV-2 infection and should not be used as the sole basis for treatment or other patient management decisions.  A negative result may occur with improper specimen collection / handling, submission of specimen other than nasopharyngeal swab, presence of viral mutation(s) within the areas targeted by this assay, and inadequate number of viral copies (<250 copies / mL). A negative result must be combined with clinical observations, patient history, and epidemiological information.  Fact Sheet for Patients:   https://www.patel.info/  Fact Sheet for Healthcare Providers: https://hall.com/  This test is not yet approved or  cleared by the [place] FDA and has been authorized for detection and/or  diagnosis of SARS-CoV-2 by FDA under an Emergency Use Authorization (EUA).  This EUA will remain in effect (meaning this test can be used) for the duration of the COVID-19 declaration under Section 564(b)(1) of the Act, 21 U.S.C. section 360bbb-3(b)(1), unless the authorization is terminated or revoked sooner.  Performed at [facility], [address], [place], [place] [address]   Blood culture (routine x 2)     Status: None (Preliminary result)   Collection Time: 12/28/21  7:22 PM   Specimen: BLOOD  Result Value Ref Range Status   Specimen Description   Final    BLOOD BLOOD LEFT HAND Performed at [facility] [address], [place], [place] [address]    Special Requests   Final    IN PEDIATRIC BOTTLE Blood Culture adequate volume Performed at [facility] [address] [place], [place] [address]    Culture   Final    NO GROWTH 3 DAYS Performed at [facility], [place] [address], [place],  [address]    Report Status PENDING  Incomplete     Radiology Studies: VAS [place] LOWER EXTREMITY VENOUS (DVT)  Result Date: 12/31/2021  Lower Venous DVT Study Patient Name:  AKIAH BAUCH  Date of Exam:   12/31/2021 Medical Rec #: [id number]       Accession #:    [id number] Date of Birth: 02/12/1935        Patient Gender: F Patient Age:   55 years Exam Location:  [facility] Procedure:      VAS [place] LOWER EXTREMITY VENOUS (DVT) Referring Phys: Raiford Noble --------------------------------------------------------------------------------  Indications: Pain.  Risk Factors: PE 04/2021. Limitations: Body habitus, poor ultrasound/tissue interface and pain intolerance. Comparison Study: Previous exam on 10/09/19 was negative for DVT. Performing Technologist: Rogelia Rohrer RVT, RDMS  Examination Guidelines: A complete evaluation includes B-mode imaging, spectral Doppler, color Doppler, and power Doppler as needed of all accessible portions  of each vessel. Bilateral testing is considered an integral part of a complete examination. Limited examinations for reoccurring indications may be performed as noted. The reflux portion of the exam is performed with  the patient in reverse Trendelenburg.  +---------+---------------+---------+-----------+----------+--------------+ RIGHT    CompressibilityPhasicitySpontaneityPropertiesThrombus Aging +---------+---------------+---------+-----------+----------+--------------+ CFV      Full           Yes      Yes                                 +---------+---------------+---------+-----------+----------+--------------+ SFJ      Full                                                        +---------+---------------+---------+-----------+----------+--------------+ FV Prox  Full           Yes      Yes                                 +---------+---------------+---------+-----------+----------+--------------+ FV Mid   Full           Yes      Yes                                 +---------+---------------+---------+-----------+----------+--------------+ FV DistalFull           Yes      Yes                                 +---------+---------------+---------+-----------+----------+--------------+ PFV      Full                                                        +---------+---------------+---------+-----------+----------+--------------+ POP      Full           Yes      Yes                                 +---------+---------------+---------+-----------+----------+--------------+ PTV      Full                                                        +---------+---------------+---------+-----------+----------+--------------+ PERO     Full                                                        +---------+---------------+---------+-----------+----------+--------------+   +----+---------------+---------+-----------+----------+--------------+  LEFTCompressibilityPhasicitySpontaneityPropertiesThrombus Aging +----+---------------+---------+-----------+----------+--------------+ CFV Full           Yes      Yes                                 +----+---------------+---------+-----------+----------+--------------+  Summary: RIGHT: - No evidence of common femoral vein obstruction. - No cystic structure found in the popliteal fossa.  LEFT: - No evidence of common femoral vein obstruction.  *See table(s) above for measurements and observations.    Preliminary    CT KNEE LEFT WO CONTRAST  Result Date: 12/30/2021 CLINICAL DATA:  Left knee pain EXAM: CT OF THE LEFT KNEE WITHOUT CONTRAST TECHNIQUE: Multidetector CT imaging of the left knee was performed according to the standard protocol. Multiplanar CT image reconstructions were also generated. RADIATION DOSE REDUCTION: This exam was performed according to the departmental dose-optimization program which includes automated exposure control, adjustment of the mA and/or kV according to patient size and/or use of iterative reconstruction technique. COMPARISON:  X-ray 12/28/2021 FINDINGS: Bones/Joint/Cartilage No acute fracture or dislocation. Severe tricompartmental osteoarthritis of the left knee with bone-on-bone apposition in the medial compartment. Bulky tricompartmental marginal osteophytes. Small joint effusion with several small intra-articular loose bodies within the suprapatellar pouch. Bones are demineralized. Ligaments Suboptimally assessed by CT. Muscles and Tendons Generalized muscle atrophy. No acute musculotendinous abnormality by CT. Soft tissues Focal soft tissue swelling and edema within the infrapatellar subcutaneous soft tissues. Scattered vascular calcifications. IMPRESSION: 1. No acute fracture or dislocation of the left knee. 2. Severe tricompartmental osteoarthritis of the left knee. 3. Small joint effusion and several small intra-articular loose bodies. Electronically Signed    By: Davina Poke D.O.   On: 12/30/2021 10:02     Scheduled Meds:  apixaban  2.5 mg Oral BID   diclofenac Sodium  2 g Topical QID   hydrALAZINE  50 mg Oral Q8H   insulin aspart  0-9 Units Subcutaneous TID WC   metoprolol succinate  50 mg Oral QPM   pravastatin  20 mg Oral q1800   traZODone  100 mg Oral QHS   Continuous Infusions:   LOS: 3 days   Raiford Noble, DO [facility] Available via Epic secure chat 7am-7pm After these hours, please refer to coverage provider listed on [email or web address] 12/31/2021, 5:33 PM

## 2022-01-01 DIAGNOSIS — N1832 Chronic kidney disease, stage 3b: Secondary | ICD-10-CM | POA: Diagnosis not present

## 2022-01-01 DIAGNOSIS — I5023 Acute on chronic systolic (congestive) heart failure: Secondary | ICD-10-CM | POA: Diagnosis not present

## 2022-01-01 DIAGNOSIS — I10 Essential (primary) hypertension: Secondary | ICD-10-CM | POA: Diagnosis not present

## 2022-01-01 LAB — COMPREHENSIVE METABOLIC PANEL
ALT: 17 U/L (ref 0–44)
AST: 18 U/L (ref 15–41)
Albumin: 3.1 g/dL — ABNORMAL LOW (ref 3.5–5.0)
Alkaline Phosphatase: 48 U/L (ref 38–126)
Anion gap: 7 (ref 5–15)
BUN: 47 mg/dL — ABNORMAL HIGH (ref 8–23)
CO2: 29 mmol/L (ref 22–32)
Calcium: 8.6 mg/dL — ABNORMAL LOW (ref 8.9–10.3)
Chloride: 103 mmol/L (ref 98–111)
Creatinine, Ser: 1.59 mg/dL — ABNORMAL HIGH (ref 0.44–1.00)
GFR, Estimated: 32 mL/min — ABNORMAL LOW (ref >60)
Glucose, Bld: 125 mg/dL — ABNORMAL HIGH (ref 70–99)
Potassium: 4.6 mmol/L (ref 3.5–5.1)
Sodium: 139 mmol/L (ref 135–145)
Total Bilirubin: 0.7 mg/dL (ref 0.3–1.2)
Total Protein: 6.6 g/dL (ref 6.5–8.1)

## 2022-01-01 LAB — CBC WITH DIFFERENTIAL/PLATELET
Abs Immature Granulocytes: 0.03 10*3/uL (ref 0.00–0.07)
Basophils Absolute: 0 10*3/uL (ref 0.0–0.1)
Basophils Relative: 0 %
Eosinophils Absolute: 0.1 10*3/uL (ref 0.0–0.5)
Eosinophils Relative: 1 %
HCT: 43.1 % (ref 36.0–46.0)
Hemoglobin: 13.9 g/dL (ref 12.0–15.0)
Immature Granulocytes: 0 %
Lymphocytes Relative: 8 %
Lymphs Abs: 0.8 10*3/uL (ref 0.7–4.0)
MCH: 31.8 pg (ref 26.0–34.0)
MCHC: 32.3 g/dL (ref 30.0–36.0)
MCV: 98.6 fL (ref 80.0–100.0)
Monocytes Absolute: 0.6 10*3/uL (ref 0.1–1.0)
Monocytes Relative: 6 %
Neutro Abs: 8.7 10*3/uL — ABNORMAL HIGH (ref 1.7–7.7)
Neutrophils Relative %: 85 %
Platelets: 187 10*3/uL (ref 150–400)
RBC: 4.37 MIL/uL (ref 3.87–5.11)
RDW: 13.8 % (ref 11.5–15.5)
WBC: 10.2 10*3/uL (ref 4.0–10.5)
nRBC: 0 % (ref 0.0–0.2)

## 2022-01-01 LAB — MAGNESIUM: Magnesium: 2.8 mg/dL — ABNORMAL HIGH (ref 1.7–2.4)

## 2022-01-01 LAB — GLUCOSE, CAPILLARY
Glucose-Capillary: 117 mg/dL — ABNORMAL HIGH (ref 70–99)
Glucose-Capillary: 107 mg/dL — ABNORMAL HIGH (ref 70–99)
Glucose-Capillary: 115 mg/dL — ABNORMAL HIGH (ref 70–99)

## 2022-01-01 LAB — PHOSPHORUS: Phosphorus: 2.6 mg/dL (ref 2.5–4.6)

## 2022-01-01 MED ORDER — LORAZEPAM 2 MG/ML IJ SOLN
1.0000 mg | Freq: Four times a day (QID) | INTRAMUSCULAR | Status: DC | PRN
  Administered 2022-01-01: 1 mg via INTRAVENOUS
  Filled 2022-01-01: qty 1

## 2022-01-01 MED ORDER — HYDRALAZINE HCL 50 MG PO TABS
75.0000 mg | ORAL_TABLET | Freq: Three times a day (TID) | ORAL | Status: DC
  Administered 2022-01-01 – 2022-01-03 (×7): 75 mg via ORAL
  Filled 2022-01-01 (×7): qty 1

## 2022-01-01 NOTE — Progress Notes (Signed)
Will sign off call for questions

## 2022-01-01 NOTE — Progress Notes (Signed)
Physical Therapy Treatment Patient Details Name: Pamela [name] MRN: [id number] DOB: 31-Mar-1935 Today's Date: 01/01/2022   History of Present Illness patient is a 87 year old female who presented from home after a fall with left knee pain.patient was admitted with acute on chronic systolic CHF, hypertensive emergency, advanced and progressive tricompartmental arthritis of L knee ad elevated D-dimer. PMH: hypertension, T2DM, HFrEF, CKD stage IIIb, hyperlipidemia and pulmonary embolism    PT Comments    General Comments: AxO x 3 very pleasant Lady and very willing to try.  Eager to go back home but she knows she needs ST Rehab. Assisted OOB required + 2 assist and increased time.  General bed mobility comments: Assist for trunk and bil LEs. Increased time. General transfer comment: increased time and + 2 assist due to pt's fear/anxiety.  Pt does best pulling up on walker and leaning forward.  Performed twice for Orthostatic vitals with NT. General Gait Details: VERY limited gait distance due to B knee pain (L>R), weakness and increased fear of falling/anxiety.  Recliner CLOSELY following behind. Did increase her distance to 10 feet.  Spouse assisted by following with recliner.   Pt will need ST Rehab at SNF prior to safely returning home with Spouse.    Recommendations for follow up therapy are one component of a multi-disciplinary discharge planning process, led by the attending physician.  Recommendations may be updated based on patient status, additional functional criteria and insurance authorization.  Follow Up Recommendations  Skilled nursing-short term rehab (<3 hours/day) Can patient physically be transported by private vehicle: No   Assistance Recommended at Discharge Frequent or constant Supervision/Assistance  Patient can return home with the following Assist for transportation;Help with stairs or ramp for entrance;Assistance with cooking/housework;A lot of help with walking and/or  transfers;A lot of help with bathing/dressing/bathroom   Equipment Recommendations  None recommended by PT    Recommendations for Other Services       Precautions / Restrictions Precautions Precautions: Fall Precaution Comments: "bad" L knee Arthritis (crepitus)  Chronic Oxygen 2 lts Restrictions Weight Bearing Restrictions: No     Mobility  Bed Mobility Overal bed mobility: Needs Assistance Bed Mobility: Supine to Sit     Supine to sit: Mod assist, +2 for physical assistance, +2 for safety/equipment, HOB elevated, Min assist     General bed mobility comments: Assist for trunk and bil LEs. Increased time.    Transfers Overall transfer level: Needs assistance Equipment used: Rolling walker (2 wheels)               General transfer comment: increased time and + 2 assist due to pt's fear/anxiety.  Pt does best pulling up on walker and leaning forward.  Performed twice for Orthostatic vitals with NT.    Ambulation/Gait Ambulation/Gait assistance: Max assist, +2 physical assistance, +2 safety/equipment Gait Distance (Feet): 10 Feet Assistive device: Rolling walker (2 wheels) Gait Pattern/deviations: Step-to pattern, Decreased stance time - left, Trunk flexed Gait velocity: decreased     General Gait Details: VERY limited gait distance due to B knee pain (L>R), weakness and increased fear of falling/anxiety.  Recliner CLOSELY following behind. Did increase her distance to 10 feet.  Spouse assisted by following with recliner.   Stairs             Wheelchair Mobility    Modified Rankin (Stroke Patients Only)       Balance  Cognition Arousal/Alertness: Awake/alert Behavior During Therapy: WFL for tasks assessed/performed Overall Cognitive Status: Within Functional Limits for tasks assessed                                 General Comments: AxO x 3 very pleasant Lady and very  willing to try.  Eager to go back home but she knows she needs ST Rehab.        Exercises      General Comments        Pertinent Vitals/Pain Pain Assessment Pain Assessment: Faces Faces Pain Scale: Hurts little more Pain Location: bil knees from fall + L knee Arthritis Pain Descriptors / Indicators: Discomfort, Sore, Aching, Grimacing Pain Intervention(s): Monitored during session, Repositioned, Ice applied    Home Living                          Prior Function            PT Goals (current goals can now be found in the care plan section) Progress towards PT goals: Progressing toward goals    Frequency    Min 2X/week      PT Plan Current plan remains appropriate    Co-evaluation              AM-PAC PT "6 Clicks" Mobility   Outcome Measure  Help needed turning from your back to your side while in a flat bed without using bedrails?: A Lot Help needed moving from lying on your back to sitting on the side of a flat bed without using bedrails?: A Lot Help needed moving to and from a bed to a chair (including a wheelchair)?: A Lot Help needed standing up from a chair using your arms (e.g., wheelchair or bedside chair)?: A Lot Help needed to walk in hospital room?: A Lot Help needed climbing 3-5 steps with a railing? : Total 6 Click Score: 11    End of Session Equipment Utilized During Treatment: Gait belt Activity Tolerance: Patient limited by pain;Patient limited by fatigue;Other (comment) Patient left: in chair;with call bell/phone within reach;with family/visitor present;with chair alarm set Nurse Communication: Mobility status PT Visit Diagnosis: Pain;Difficulty in walking, not elsewhere classified (R26.2);Muscle weakness (generalized) (M62.81);History of falling (Z91.81) Pain - Right/Left: Left Pain - part of body: Knee     Time: 4970-2637 PT Time Calculation (min) (ACUTE ONLY): 23 min  Charges:  $Gait Training: 8-22 mins $Therapeutic  Activity: 8-22 mins                     Rica Koyanagi  PTA [place] [facility] M-F          [phone] Weekend pager [phone]

## 2022-01-01 NOTE — Progress Notes (Signed)
Orthostatic assessment:  Sitting   BP-190/74 P-95 T - 98.2 RR-19  Lying BP- 158/49 P-78 T- 98.3 RR-18  Standing  BP - 174/81 P- 95 RR - 18

## 2022-01-01 NOTE — Plan of Care (Signed)

## 2022-01-01 NOTE — Progress Notes (Signed)
PROGRESS NOTE    Pamela [name]  UYQ:[id number] DOB: [birth year] DOA: 12/28/2021 PCP: Orpah Melter, MD   Brief Narrative:   Pamela [name] is an 87 y.o. female with a history of PE in setting of covid-19 infection, chronic HFrEF, stage IIIb CKD, HTN, HLD, T2DM who fell at home with subsequent left knee pain.   She was hypertensive to 214/104, tachypneic and hypoxemic requiring 2L O2. CBC was normal, SCr 1.47 (baseline 1.3-1.4). BNP 216.2, D-dimer 0.69, lactic acid negative x2, troponin negative x2. Bilateral opacities noted on CXR favoring pulmonary edema. Left knee XR revealed severe, progressive osteoarthritis with joint effusion and possible intra-articular loose bodies.   IV lasix started for acute HFrEF causing acute hypoxic respiratory failure. Antibiotics initially given though pneumonia is felt to be ruled out. She converted to rapid AFib on 6/28 which converted with IV metoprolol. Cardiology is consulted and recommending holding diuretics still given worsened Renal Fxn. Orthopedics is consulted for left knee abnormalities including effusion and intraarticular loose bodies confirmed on CT.  Assessment and Plan: No notes have been filed under this hospital service. Service: Hospitalist  Acute on Chronic HFrEF, improving  -LVEF is stable, 45-50%, based on repeat echo.  - Euvolemic after IV lasix, creatinine bump noted, stopped lasix.  -Continue to Monitor Strict I/O's and daily weights  -Patient is - 2.777 Liters since admission and Weight is down from 122 kg -> 119.3 kg -With AKI, will hold dapagliflozin -Continue metoprolol succinate 50 mg po qHS -Initiation of further GDMT limited by renal impairment at this time.  -She is now on hydralazine 75 mg p.o. 3 times daily -Continue to monitor volume status carefully and cardiology recommends resuming p.o. Lasix 20 mg daily once her renal function returns back to baseline   Atrial fibrillation with RVR:  -Asymptomatic, so unclear  when this first occurred, initially noted the day after admission. TSH 3.013.  - Given hx LV systolic dysfunction, not using CCB. Continue metoprolol.  - CHA2DS2-VASc score is 6, pt has tolerated eliquis in the past, anticipate starting this once cleared by orthopedics to do so. This was discussed with pt and spouse and she was initiated on Eliquis 2.5 mg p.o. twice daily.    HTN urgency:  -Pain contributing, but still uncontrolled and was significantly elevated this morning due to anxiety - Continue increased metoprolol succinate - ARB/spiro unable to start in setting of AKI, so starting hydralazine and this is increased to 50 mg p.o. 3 times daily yesterday to 75 mg 3 times daily today - Pain control as below -Continue monitor blood pressures per protocol and continue with IV hydralazine 10 mg IV every 6 as needed prn as well -Last blood pressure reading was 158/49   Left knee OA and effusion: -Concern for loose bodies in the joint by XR and CT. - Orthopedics consulted, seem to be planning arthrocentesis but could not be done and instead received a steroid injection.  - Multimodal pain control. -PT OT recommending SNF and TOC assisting with placement   AKI on stage IIIb CKD:  -Cr up with diuresis.  - Stopping lasix, consider further work up if not improving.  - Avoid nephrotoxins contrast dyes, hypotension, and dehydration to ensure adequate renal perfusion and renally adjust medications -Was going to restart dapagliflozin at lower dose, but will hold this for now with AKI.  -We will hold resuming her Lasix until renal function is back to her baseline -BUN/Cr is improving and went from 37/2.22 -> 42/1.86 ->  47/1.59 -Continue to Monitor Renal Fxn Carefully and repeat CMP in the AM   Pneumonia  -Felt to be ruled out as pt is afebrile without cough or leukocytosis on arrival. PCT undetectable. We will monitor cultures off antibiotics   Well-controlled NIDT2DM -HbA1c 6.1%.  -SSI with  sensitive NovoLog sign scale insulin AC -CBGs ranging from 107-152   HLD:  -Continue Pravastatin 20 mg po Daily    History of provoked PE (covid-19 infection):  -Had completed anticoagulation. Did not have bleeding complications. Age-adjusted D-dimer is negative here.  -Resumed AC due to A Fib    Insomnia:  -Reordered home Trazodone 100 mg po qHS   Morbid Obesity -Complicates overall prognosis and care -Estimated body mass index is 48.1 kg/m as calculated from the following:   Height as of this encounter: '5\' 2"'$  (1.575 m).   Weight as of this encounter: 119.3 kg.  -Weight Loss and Dietary Counseling given  DVT prophylaxis: apixaban (ELIQUIS) tablet 2.5 mg Start: 12/30/21 2200 SCDs Start: 12/28/21 2303 apixaban (ELIQUIS) tablet 2.5 mg    Code Status: DNR Family Communication: Discussed with Husband at bedside  Disposition Plan:  Level of care: Progressive Status is: Inpatient Remains inpatient appropriate because: Needs SNF placement and insurance authorization and bed availability   Consultants:  Cardiology Orthopedic surgery  Procedures:  Echo 12/29/21: 1. Left ventricular ejection fraction, by estimation, is 45 to 50%. The  left ventricle has mildly decreased function. The left ventricle  demonstrates global hypokinesis. There is mild left ventricular  hypertrophy. Left ventricular diastolic parameters  are consistent with Grade II diastolic dysfunction (pseudonormalization).   2. Right ventricular systolic function is normal. The right ventricular  size is normal.   3. The mitral valve is normal in structure. No evidence of mitral valve  regurgitation. No evidence of mitral stenosis. Moderate mitral annular  calcification.   4. The aortic valve is normal in structure. Aortic valve regurgitation is  not visualized. No aortic stenosis is present.   5. The inferior vena cava is normal in size with greater than 50%  respiratory variability, suggesting right atrial  pressure of 3 mmHg.    Attempted Arthrocentesis and Cortisone Injection   Right Leg Venous Duplex  Summary:  RIGHT:  - No evidence of common femoral vein obstruction.  - No cystic structure found in the popliteal fossa.     LEFT:  - No evidence of common femoral vein obstruction.      Antimicrobials:  Anti-infectives (From admission, onward)    Start     Dose/Rate Route Frequency Ordered Stop   12/28/21 1415  cefTRIAXone (ROCEPHIN) 1 g in sodium chloride 0.9 % 100 mL IVPB        1 g 200 mL/hr over 30 Minutes Intravenous  Once 12/28/21 1409 12/28/21 1449   12/28/21 1415  azithromycin (ZITHROMAX) 500 mg in sodium chloride 0.9 % 250 mL IVPB        500 mg 250 mL/hr over 60 Minutes Intravenous  Once 12/28/21 1409 12/28/21 1518        Subjective: And examined at bedside and states that she had a very bad night and states that she did not sleep very well at all despite taking trazodone.  She is very anxious this morning.  Also complaining of some pain in her knee.  No nausea or vomiting.  No other concerns or complaints at this time  Objective: Vitals:   01/01/22 0522 01/01/22 0904 01/01/22 1100 01/01/22 1341  BP: (!) 165/80 Marland Kitchen)  196/87 (!) 186/67 (!) 158/49  Pulse: 68 64  78  Resp: '20 20  18  '$ Temp: (!) 97.5 F (36.4 C) 98 F (36.7 C)  98.3 F (36.8 C)  TempSrc: Oral Oral  Oral  SpO2: 93% 92%  95%  Weight:      Height:        Intake/Output Summary (Last 24 hours) at 01/01/2022 1521 Last data filed at 01/01/2022 0900 Gross per 24 hour  Intake 360 ml  Output 700 ml  Net -340 ml   Filed Weights   12/30/21 0438 12/31/21 0234 01/01/22 0440  Weight: 120.3 kg 119.8 kg 119.3 kg   Examination: Physical Exam:  Constitutional: WN/WD morbidly obese Caucasian female currently in mild distress and appears extremely anxious Respiratory: Diminished to auscultation bilaterally with coarse breath sounds, no wheezing, rales, rhonchi or crackles. Normal respiratory effort and patient is  not tachypenic. No accessory muscle use.  Wearing supplemental oxygen via nasal cannula Cardiovascular: RRR, no murmurs / rubs / gallops. S1 and S2 auscultated.  Trace lower extremity edema Abdomen: Soft, non-tender, distended secondary body habitus. Bowel sounds positive.  GU: Deferred. Musculoskeletal: No clubbing / cyanosis of digits/nails. No joint deformity upper and lower extremities. Skin: No rashes, lesions, ulcers. No induration; Warm and dry.  Neurologic: CN 2-12 grossly intact with no focal deficits. Romberg sign and cerebellar reflexes not assessed.  Psychiatric: Normal judgment and insight. Alert and oriented x 3. Normal mood and appropriate affect.   Data Reviewed: I have personally reviewed following labs and imaging studies  CBC: Recent Labs  Lab 12/28/21 1151 12/29/21 0347 12/30/21 0359 01/01/22 0403  WBC 10.0 12.3* 10.9* 10.2  NEUTROABS 7.9*  --   --  8.7*  HGB 14.3 16.3* 14.4 13.9  HCT 44.3 50.3* 44.8 43.1  MCV 96.5 98.1 100.4* 98.6  PLT 172 177 161 790   Basic Metabolic Panel: Recent Labs  Lab 12/28/21 1151 12/29/21 0347 12/30/21 0359 12/31/21 0354 01/01/22 0403  NA 141 142 138 139 139  K 4.1 3.4* 4.3 4.3 4.6  CL 105 104 102 102 103  CO2 '28 27 27 29 29  '$ GLUCOSE 99 119* 89 120* 125*  BUN 29* 25* 37* 42* 47*  CREATININE 1.47* 1.34* 2.22* 1.86* 1.59*  CALCIUM 10.0 8.9 8.3* 8.4* 8.6*  MG  --  1.9 2.2  --  2.8*  PHOS  --  2.9  --   --  2.6   GFR: Estimated Creatinine Clearance: 31.8 mL/min (A) (by C-G formula based on SCr of 1.59 mg/dL (H)). Liver Function Tests: Recent Labs  Lab 12/28/21 1151 12/29/21 0347 01/01/22 0403  AST 12* 18 18  ALT '11 15 17  '$ ALKPHOS 52 55 48  BILITOT 0.9 1.2 0.7  PROT 7.2 7.6 6.6  ALBUMIN 4.0 3.9 3.1*   No results for input(s): "LIPASE", "AMYLASE" in the last 168 hours. No results for input(s): "AMMONIA" in the last 168 hours. Coagulation Profile: No results for input(s): "INR", "PROTIME" in the last 168  hours. Cardiac Enzymes: No results for input(s): "CKTOTAL", "CKMB", "CKMBINDEX", "TROPONINI" in the last 168 hours. BNP (last 3 results) No results for input(s): "PROBNP" in the last 8760 hours. HbA1C: No results for input(s): "HGBA1C" in the last 72 hours. CBG: Recent Labs  Lab 12/31/21 0732 12/31/21 1631 12/31/21 2127 01/01/22 0748 01/01/22 1206  GLUCAP 129* 118* 152* 117* 107*   Lipid Profile: No results for input(s): "CHOL", "HDL", "LDLCALC", "TRIG", "CHOLHDL", "LDLDIRECT" in the last 72 hours. Thyroid Function  Tests: No results for input(s): "TSH", "T4TOTAL", "FREET4", "T3FREE", "THYROIDAB" in the last 72 hours. Anemia Panel: No results for input(s): "VITAMINB12", "FOLATE", "FERRITIN", "TIBC", "IRON", "RETICCTPCT" in the last 72 hours. Sepsis Labs: Recent Labs  Lab 12/28/21 1151 12/28/21 1921 12/29/21 0116  PROCALCITON  --   --  <0.10  LATICACIDVEN 1.0 1.1  --     Recent Results (from the past 240 hour(s))  Blood culture (routine x 2)     Status: None (Preliminary result)   Collection Time: 12/28/21 11:50 AM   Specimen: BLOOD RIGHT FOREARM  Result Value Ref Range Status   Specimen Description   Final    BLOOD RIGHT FOREARM Performed at [facility], [address] [place], [place] [address]    Special Requests   Final    BOTTLES DRAWN AEROBIC AND ANAEROBIC Blood Culture adequate volume Performed at [facility], [address] [place], [place] [address]    Culture   Final    NO GROWTH 4 DAYS Performed at [facility], [place] [address], [place], [place] [address]    Report Status PENDING  Incomplete  SARS Coronavirus 2 by RT PCR (hospital order, performed in [facility] hospital lab) *cepheid single result test* Anterior Nasal Swab     Status: None   Collection Time: 12/28/21 11:51 AM   Specimen: Anterior Nasal Swab  Result Value Ref Range Status   SARS Coronavirus 2 by RT PCR NEGATIVE NEGATIVE Final    Comment:  (NOTE) SARS-CoV-2 target nucleic acids are NOT DETECTED.  The SARS-CoV-2 RNA is generally detectable in upper and lower respiratory specimens during the acute phase of infection. The lowest concentration of SARS-CoV-2 viral copies this assay can detect is 250 copies / mL. A negative result does not preclude SARS-CoV-2 infection and should not be used as the sole basis for treatment or other patient management decisions.  A negative result may occur with improper specimen collection / handling, submission of specimen other than nasopharyngeal swab, presence of viral mutation(s) within the areas targeted by this assay, and inadequate number of viral copies (<250 copies / mL). A negative result must be combined with clinical observations, patient history, and epidemiological information.  Fact Sheet for Patients:   https://www.patel.info/  Fact Sheet for Healthcare Providers: https://hall.com/  This test is not yet approved or  cleared by the [place] FDA and has been authorized for detection and/or diagnosis of SARS-CoV-2 by FDA under an Emergency Use Authorization (EUA).  This EUA will remain in effect (meaning this test can be used) for the duration of the COVID-19 declaration under Section 564(b)(1) of the Act, 21 U.S.C. section 360bbb-3(b)(1), unless the authorization is terminated or revoked sooner.  Performed at [facility], [address], [place], [place] [address]   Blood culture (routine x 2)     Status: None (Preliminary result)   Collection Time: 12/28/21  7:22 PM   Specimen: BLOOD  Result Value Ref Range Status   Specimen Description   Final    BLOOD BLOOD LEFT HAND Performed at [facility] [address], [place], [place] [address]    Special Requests   Final    IN PEDIATRIC BOTTLE Blood Culture adequate volume Performed at [facility] [address] [place], [place] [address]    Culture   Final    NO GROWTH 4 DAYS Performed at [facility], [place] [address], [place], [place] [address]  Report Status PENDING  Incomplete    Radiology Studies: VAS [place] LOWER EXTREMITY VENOUS (DVT)  Result Date: 01/01/2022  Lower Venous DVT Study Patient Name:  KENNA SEWARD  Date of Exam:   12/31/2021 Medical Rec #: [id number]       Accession #:    [id number] Date of Birth: Aug 28, 1934        Patient Gender: F Patient Age:   51 years Exam Location:  [facility] Procedure:      VAS [place] LOWER EXTREMITY VENOUS (DVT) Referring Phys: Raiford Noble --------------------------------------------------------------------------------  Indications: Pain.  Risk Factors: PE 04/2021. Limitations: Body habitus, poor ultrasound/tissue interface and pain intolerance. Comparison Study: Previous exam on 10/09/19 was negative for DVT. Performing Technologist: Rogelia Rohrer RVT, RDMS  Examination Guidelines: A complete evaluation includes B-mode imaging, spectral Doppler, color Doppler, and power Doppler as needed of all accessible portions of each vessel. Bilateral testing is considered an integral part of a complete examination. Limited examinations for reoccurring indications may be performed as noted. The reflux portion of the exam is performed with the patient in reverse Trendelenburg.  +---------+---------------+---------+-----------+----------+--------------+ RIGHT    CompressibilityPhasicitySpontaneityPropertiesThrombus Aging +---------+---------------+---------+-----------+----------+--------------+ CFV      Full           Yes      Yes                                 +---------+---------------+---------+-----------+----------+--------------+ SFJ      Full                                                        +---------+---------------+---------+-----------+----------+--------------+ FV Prox  Full           Yes      Yes                                  +---------+---------------+---------+-----------+----------+--------------+ FV Mid   Full           Yes      Yes                                 +---------+---------------+---------+-----------+----------+--------------+ FV DistalFull           Yes      Yes                                 +---------+---------------+---------+-----------+----------+--------------+ PFV      Full                                                        +---------+---------------+---------+-----------+----------+--------------+ POP      Full           Yes      Yes                                 +---------+---------------+---------+-----------+----------+--------------+  PTV      Full                                                        +---------+---------------+---------+-----------+----------+--------------+ PERO     Full                                                        +---------+---------------+---------+-----------+----------+--------------+   +----+---------------+---------+-----------+----------+--------------+ LEFTCompressibilityPhasicitySpontaneityPropertiesThrombus Aging +----+---------------+---------+-----------+----------+--------------+ CFV Full           Yes      Yes                                 +----+---------------+---------+-----------+----------+--------------+    Summary: RIGHT: - No evidence of common femoral vein obstruction. - No cystic structure found in the popliteal fossa.  LEFT: - No evidence of common femoral vein obstruction.  *See table(s) above for measurements and observations. Electronically signed by Jamelle Haring on 01/01/2022 at 9:44:57 AM.    Final     Scheduled Meds:  apixaban  2.5 mg Oral BID   diclofenac Sodium  2 g Topical QID   hydrALAZINE  75 mg Oral Q8H   insulin aspart  0-9 Units Subcutaneous TID WC   metoprolol succinate  50 mg Oral QPM   pravastatin  20 mg Oral q1800   traZODone  100 mg Oral QHS   Continuous  Infusions:   LOS: 4 days   Raiford Noble, DO [facility] Available via Epic secure chat 7am-7pm After these hours, please refer to coverage provider listed on [email or web address] 01/01/2022, 3:21 PM

## 2022-01-02 DIAGNOSIS — I5023 Acute on chronic systolic (congestive) heart failure: Secondary | ICD-10-CM | POA: Diagnosis not present

## 2022-01-02 DIAGNOSIS — I10 Essential (primary) hypertension: Secondary | ICD-10-CM | POA: Diagnosis not present

## 2022-01-02 DIAGNOSIS — N1832 Chronic kidney disease, stage 3b: Secondary | ICD-10-CM | POA: Diagnosis not present

## 2022-01-02 LAB — CULTURE, BLOOD (ROUTINE X 2)
Culture: NO GROWTH
Culture: NO GROWTH
Special Requests: ADEQUATE
Special Requests: ADEQUATE

## 2022-01-02 LAB — COMPREHENSIVE METABOLIC PANEL
ALT: 17 U/L (ref 0–44)
AST: 18 U/L (ref 15–41)
Albumin: 2.9 g/dL — ABNORMAL LOW (ref 3.5–5.0)
Alkaline Phosphatase: 46 U/L (ref 38–126)
Anion gap: 7 (ref 5–15)
BUN: 49 mg/dL — ABNORMAL HIGH (ref 8–23)
CO2: 29 mmol/L (ref 22–32)
Calcium: 8.6 mg/dL — ABNORMAL LOW (ref 8.9–10.3)
Chloride: 104 mmol/L (ref 98–111)
Creatinine, Ser: 1.54 mg/dL — ABNORMAL HIGH (ref 0.44–1.00)
GFR, Estimated: 33 mL/min — ABNORMAL LOW (ref >60)
Glucose, Bld: 101 mg/dL — ABNORMAL HIGH (ref 70–99)
Potassium: 4.5 mmol/L (ref 3.5–5.1)
Sodium: 140 mmol/L (ref 135–145)
Total Bilirubin: 0.8 mg/dL (ref 0.3–1.2)
Total Protein: 6.4 g/dL — ABNORMAL LOW (ref 6.5–8.1)

## 2022-01-02 LAB — GLUCOSE, CAPILLARY
Glucose-Capillary: 101 mg/dL — ABNORMAL HIGH (ref 70–99)
Glucose-Capillary: 118 mg/dL — ABNORMAL HIGH (ref 70–99)
Glucose-Capillary: 136 mg/dL — ABNORMAL HIGH (ref 70–99)
Glucose-Capillary: 113 mg/dL — ABNORMAL HIGH (ref 70–99)

## 2022-01-02 LAB — CBC WITH DIFFERENTIAL/PLATELET
Abs Immature Granulocytes: 0.03 10*3/uL (ref 0.00–0.07)
Basophils Absolute: 0.1 10*3/uL (ref 0.0–0.1)
Basophils Relative: 1 %
Eosinophils Absolute: 0.2 10*3/uL (ref 0.0–0.5)
Eosinophils Relative: 2 %
HCT: 44.3 % (ref 36.0–46.0)
Hemoglobin: 14 g/dL (ref 12.0–15.0)
Immature Granulocytes: 0 %
Lymphocytes Relative: 11 %
Lymphs Abs: 1 10*3/uL (ref 0.7–4.0)
MCH: 31.8 pg (ref 26.0–34.0)
MCHC: 31.6 g/dL (ref 30.0–36.0)
MCV: 100.7 fL — ABNORMAL HIGH (ref 80.0–100.0)
Monocytes Absolute: 0.8 10*3/uL (ref 0.1–1.0)
Monocytes Relative: 9 %
Neutro Abs: 7 10*3/uL (ref 1.7–7.7)
Neutrophils Relative %: 77 %
Platelets: 214 10*3/uL (ref 150–400)
RBC: 4.4 MIL/uL (ref 3.87–5.11)
RDW: 13.9 % (ref 11.5–15.5)
WBC: 9.1 10*3/uL (ref 4.0–10.5)
nRBC: 0 % (ref 0.0–0.2)

## 2022-01-02 LAB — PHOSPHORUS: Phosphorus: 3.5 mg/dL (ref 2.5–4.6)

## 2022-01-02 LAB — MAGNESIUM: Magnesium: 3.1 mg/dL — ABNORMAL HIGH (ref 1.7–2.4)

## 2022-01-02 NOTE — Plan of Care (Signed)
Problem: Education: Goal: Knowledge of General Education information will improve Description: Including pain rating scale, medication(s)/side effects and non-pharmacologic comfort measures 01/02/2022 2018 by Glenna Fellows D, RN Outcome: Progressing 01/02/2022 2018 by Glenna Fellows D, RN Outcome: Progressing   Problem: Health Behavior/Discharge Planning: Goal: Ability to manage health-related needs will improve 01/02/2022 2018 by Tula Nakayama, RN Outcome: Progressing 01/02/2022 2018 by Glenna Fellows D, RN Outcome: Progressing   Problem: Clinical Measurements: Goal: Ability to maintain clinical measurements within normal limits will improve 01/02/2022 2018 by Glenna Fellows D, RN Outcome: Progressing 01/02/2022 2018 by Glenna Fellows D, RN Outcome: Progressing Goal: Will remain free from infection 01/02/2022 2018 by Glenna Fellows D, RN Outcome: Progressing 01/02/2022 2018 by Glenna Fellows D, RN Outcome: Progressing Goal: Diagnostic test results will improve 01/02/2022 2018 by Glenna Fellows D, RN Outcome: Progressing 01/02/2022 2018 by Glenna Fellows D, RN Outcome: Progressing Goal: Respiratory complications will improve 01/02/2022 2018 by Glenna Fellows D, RN Outcome: Progressing 01/02/2022 2018 by Glenna Fellows D, RN Outcome: Progressing Goal: Cardiovascular complication will be avoided 01/02/2022 2018 by Glenna Fellows D, RN Outcome: Progressing 01/02/2022 2018 by Glenna Fellows D, RN Outcome: Progressing   Problem: Activity: Goal: Risk for activity intolerance will decrease 01/02/2022 2018 by Glenna Fellows D, RN Outcome: Progressing 01/02/2022 2018 by Glenna Fellows D, RN Outcome: Progressing   Problem: Nutrition: Goal: Adequate nutrition will be maintained 01/02/2022 2018 by Glenna Fellows D, RN Outcome: Progressing 01/02/2022 2018 by Glenna Fellows D, RN Outcome: Progressing   Problem: Coping: Goal: Level of anxiety will decrease 01/02/2022 2018 by Glenna Fellows D, RN Outcome:  Progressing 01/02/2022 2018 by Glenna Fellows D, RN Outcome: Progressing   Problem: Elimination: Goal: Will not experience complications related to bowel motility 01/02/2022 2018 by Glenna Fellows D, RN Outcome: Progressing 01/02/2022 2018 by Glenna Fellows D, RN Outcome: Progressing Goal: Will not experience complications related to urinary retention 01/02/2022 2018 by Tula Nakayama, RN Outcome: Progressing 01/02/2022 2018 by Glenna Fellows D, RN Outcome: Progressing   Problem: Pain Managment: Goal: General experience of comfort will improve 01/02/2022 2018 by Tula Nakayama, RN Outcome: Progressing 01/02/2022 2018 by Glenna Fellows D, RN Outcome: Progressing   Problem: Safety: Goal: Ability to remain free from injury will improve 01/02/2022 2018 by Tula Nakayama, RN Outcome: Progressing 01/02/2022 2018 by Glenna Fellows D, RN Outcome: Progressing   Problem: Skin Integrity: Goal: Risk for impaired skin integrity will decrease 01/02/2022 2018 by Glenna Fellows D, RN Outcome: Progressing 01/02/2022 2018 by Glenna Fellows D, RN Outcome: Progressing   Problem: Education: Goal: Ability to demonstrate management of disease process will improve 01/02/2022 2018 by Tula Nakayama, RN Outcome: Progressing 01/02/2022 2018 by Glenna Fellows D, RN Outcome: Progressing Goal: Ability to verbalize understanding of medication therapies will improve 01/02/2022 2018 by Tula Nakayama, RN Outcome: Progressing 01/02/2022 2018 by Tula Nakayama, RN Outcome: Progressing Goal: Individualized Educational Video(s) 01/02/2022 2018 by Glenna Fellows D, RN Outcome: Progressing 01/02/2022 2018 by Glenna Fellows D, RN Outcome: Progressing   Problem: Activity: Goal: Capacity to carry out activities will improve 01/02/2022 2018 by Glenna Fellows D, RN Outcome: Progressing 01/02/2022 2018 by Glenna Fellows D, RN Outcome: Progressing   Problem: Cardiac: Goal: Ability to achieve and maintain adequate  cardiopulmonary perfusion will improve 01/02/2022 2018 by Tula Nakayama, RN Outcome: Progressing 01/02/2022 2018 by Glenna Fellows D, RN Outcome: Progressing   Problem: Education: Goal: Ability to describe self-care measures that may prevent or decrease complications (Diabetes Survival  Skills Education) will improve 01/02/2022 2018 by Glenna Fellows D, RN Outcome: Progressing 01/02/2022 2018 by Tula Nakayama, RN Outcome: Progressing Goal: Individualized Educational Video(s) 01/02/2022 2018 by Glenna Fellows D, RN Outcome: Progressing 01/02/2022 2018 by Glenna Fellows D, RN Outcome: Progressing   Problem: Coping: Goal: Ability to adjust to condition or change in health will improve 01/02/2022 2018 by Tula Nakayama, RN Outcome: Progressing 01/02/2022 2018 by Glenna Fellows D, RN Outcome: Progressing   Problem: Fluid Volume: Goal: Ability to maintain a balanced intake and output will improve 01/02/2022 2018 by Glenna Fellows D, RN Outcome: Progressing 01/02/2022 2018 by Glenna Fellows D, RN Outcome: Progressing   Problem: Health Behavior/Discharge Planning: Goal: Ability to identify and utilize available resources and services will improve 01/02/2022 2018 by Tula Nakayama, RN Outcome: Progressing 01/02/2022 2018 by Glenna Fellows D, RN Outcome: Progressing Goal: Ability to manage health-related needs will improve 01/02/2022 2018 by Tula Nakayama, RN Outcome: Progressing 01/02/2022 2018 by Glenna Fellows D, RN Outcome: Progressing   Problem: Metabolic: Goal: Ability to maintain appropriate glucose levels will improve 01/02/2022 2018 by Glenna Fellows D, RN Outcome: Progressing 01/02/2022 2018 by Glenna Fellows D, RN Outcome: Progressing   Problem: Nutritional: Goal: Maintenance of adequate nutrition will improve 01/02/2022 2018 by Glenna Fellows D, RN Outcome: Progressing 01/02/2022 2018 by Glenna Fellows D, RN Outcome: Progressing Goal: Progress toward achieving an optimal weight  will improve 01/02/2022 2018 by Glenna Fellows D, RN Outcome: Progressing 01/02/2022 2018 by Glenna Fellows D, RN Outcome: Progressing   Problem: Skin Integrity: Goal: Risk for impaired skin integrity will decrease 01/02/2022 2018 by Glenna Fellows D, RN Outcome: Progressing 01/02/2022 2018 by Glenna Fellows D, RN Outcome: Progressing   Problem: Tissue Perfusion: Goal: Adequacy of tissue perfusion will improve 01/02/2022 2018 by Glenna Fellows D, RN Outcome: Progressing 01/02/2022 2018 by Tula Nakayama, RN Outcome: Progressing

## 2022-01-02 NOTE — Progress Notes (Signed)
PROGRESS NOTE    Pamela [name]  EHM:[id number] DOB: [birth year] DOA: 12/28/2021 PCP: Orpah Melter, MD   Brief Narrative:   Pamela [name] is an 87 y.o. female with a history of PE in setting of covid-19 infection, chronic HFrEF, stage IIIb CKD, HTN, HLD, T2DM who fell at home with subsequent left knee pain.   She was hypertensive to 214/104, tachypneic and hypoxemic requiring 2L O2. CBC was normal, SCr 1.47 (baseline 1.3-1.4). BNP 216.2, D-dimer 0.69, lactic acid negative x2, troponin negative x2. Bilateral opacities noted on CXR favoring pulmonary edema. Left knee XR revealed severe, progressive osteoarthritis with joint effusion and possible intra-articular loose bodies.   IV lasix started for acute HFrEF causing acute hypoxic respiratory failure. Antibiotics initially given though pneumonia is felt to be ruled out. She converted to rapid AFib on 6/28 which converted with IV metoprolol. Cardiology is consulted and recommending holding diuretics still given worsened Renal Fxn. Orthopedics is consulted for left knee abnormalities including effusion and intraarticular loose bodies confirmed on CT in the a.m. attempted arthrocentesis but this was unsuccessful for day just give her steroid injection.  She is improving renal function is improved.  PT OT still recommending SNF for strengthening rehab prior to safe return home with spouse.  Patient's weakness is improving slowly and she did increase her ambulation to 10 feet yesterday.  Assessment and Plan: No notes have been filed under this hospital service. Service: Hospitalist  Acute on Chronic HFrEF, improving  -LVEF is stable, 45-50%, based on repeat echo.  - Euvolemic after IV lasix, creatinine bump noted, stopped lasix.  -Continue to Monitor Strict I/O's and daily weights  -Patient is - 2.777 Liters since admission and Weight is down from 122 kg -> 119.3 kg -With AKI, will hold dapagliflozin -Continue metoprolol succinate 50 mg po  qHS -Initiation of further GDMT limited by renal impairment at this time.  -She is now on hydralazine 75 mg p.o. 3 times daily -Continue to monitor volume status carefully and cardiology recommends resuming p.o. Lasix 20 mg daily once her renal function returns back to baseline (Cr of 1.3)   Atrial fibrillation with RVR:  -Asymptomatic, so unclear when this first occurred, initially noted the day after admission. TSH 3.013.  - Given hx LV systolic dysfunction, not using CCB. Continue metoprolol.  - CHA2DS2-VASc score is 6, pt has tolerated eliquis in the past, anticipate starting this once cleared by orthopedics to do so. This was discussed with pt and spouse and she was initiated on Eliquis 2.5 mg p.o. twice daily.    HTN urgency:  -Pain contributing, but still uncontrolled and was significantly elevated this morning due to anxiety - Continue increased metoprolol succinate - ARB/spiro unable to start in setting of AKI, so Hydralazine was started and this was increased to 75 mg 3 times daily -Continue monitor blood pressures per protocol and continue with IV hydralazine 10 mg IV every 6 as needed prn as well -Last blood pressure reading was 158/49   Left knee OA and effusion: -Concern for loose bodies in the joint by XR and CT. - Orthopedics consulted, seem to be planning arthrocentesis but could not be done and instead received a steroid injection.  - Multimodal pain control. -PT OT recommending SNF and TOC assisting with placement   AKI on stage IIIb CKD:  -Cr up with diuresis.  - Stopping lasix, consider further work up if not improving.  - Avoid nephrotoxins contrast dyes, hypotension, and dehydration to ensure adequate renal  perfusion and renally adjust medications -Was going to restart dapagliflozin at lower dose, but will hold this for now with AKI.  -We will hold resuming her Lasix until renal function is back to her baseline -BUN/Cr is improving and went from 37/2.22 -> 42/1.86  -> 47/1.59 -> 49/1.54 -Continue to Monitor Renal Fxn Carefully and repeat CMP in the AM   Pneumonia  -Felt to be ruled out as pt is afebrile without cough or leukocytosis on arrival. PCT undetectable. We will monitor cultures off antibiotics   Well-controlled NIDT2DM -HbA1c 6.1%.  -SSI with sensitive NovoLog sign scale insulin AC -CBGs ranging from 101-118   HLD:  -Continue Pravastatin 20 mg po Daily    History of provoked PE (covid-19 infection):  -Had completed anticoagulation. Did not have bleeding complications. Age-adjusted D-dimer is negative here.  -Resumed AC due to A Fib    Insomnia:  -Reordered home Trazodone 100 mg po qHS -Still not sleeping very well given her extreme anxiousness   Morbid Obesity -Complicates overall prognosis and care -Estimated body mass index is 48.06 kg/m as calculated from the following:   Height as of this encounter: '5\' 2"'$  (1.575 m).   Weight as of this encounter: 119.2 kg.  -Weight Loss and Dietary Counseling given  DVT prophylaxis: apixaban (ELIQUIS) tablet 2.5 mg Start: 12/30/21 2200 SCDs Start: 12/28/21 2303 apixaban (ELIQUIS) tablet 2.5 mg    Code Status: DNR Family Communication: Discussed with spouse and friends at bedside  Disposition Plan:  Level of care: Progressive Status is: Inpatient Remains inpatient appropriate because: Needs SNF and insurance authorization and awaiting bed availability   Consultants:  Cardiology Orthopedic surgery  Procedures:  Echo 12/29/21: 1. Left ventricular ejection fraction, by estimation, is 45 to 50%. The  left ventricle has mildly decreased function. The left ventricle  demonstrates global hypokinesis. There is mild left ventricular  hypertrophy. Left ventricular diastolic parameters  are consistent with Grade II diastolic dysfunction (pseudonormalization).   2. Right ventricular systolic function is normal. The right ventricular  size is normal.   3. The mitral valve is normal in  structure. No evidence of mitral valve  regurgitation. No evidence of mitral stenosis. Moderate mitral annular  calcification.   4. The aortic valve is normal in structure. Aortic valve regurgitation is  not visualized. No aortic stenosis is present.   5. The inferior vena cava is normal in size with greater than 50%  respiratory variability, suggesting right atrial pressure of 3 mmHg.    Attempted Arthrocentesis and Cortisone Injection   Right Leg Venous Duplex  Summary:  RIGHT:  - No evidence of common femoral vein obstruction.  - No cystic structure found in the popliteal fossa.     LEFT:  - No evidence of common femoral vein obstruction.     Antimicrobials:  Anti-infectives (From admission, onward)    Start     Dose/Rate Route Frequency Ordered Stop   12/28/21 1415  cefTRIAXone (ROCEPHIN) 1 g in sodium chloride 0.9 % 100 mL IVPB        1 g 200 mL/hr over 30 Minutes Intravenous  Once 12/28/21 1409 12/28/21 1449   12/28/21 1415  azithromycin (ZITHROMAX) 500 mg in sodium chloride 0.9 % 250 mL IVPB        500 mg 250 mL/hr over 60 Minutes Intravenous  Once 12/28/21 1409 12/28/21 1518       Subjective: Seen and examined at bedside and states that she had another bad night and was very restless and  could not sleep last night.  Symptoms worsen with solids been relieved.  She feels a little bit better now that husband is at bedside.  Awaiting SNF placement.  Hopeful she will try and go home depending on her progress however PT OT still recommending SNF.  She feels okay.  No other concerns or close at this time.  Objective: Vitals:   01/02/22 0124 01/02/22 0454 01/02/22 0500 01/02/22 1310  BP: 136/83 (!) 155/70  (!) 176/68  Pulse: 78 91  85  Resp:  20  18  Temp:  97.6 F (36.4 C)  97.8 F (36.6 C)  TempSrc:  Oral  Oral  SpO2:  95%  92%  Weight:   119.2 kg   Height:        Intake/Output Summary (Last 24 hours) at 01/02/2022 1329 Last data filed at 01/02/2022 0900 Gross per 24  hour  Intake 240 ml  Output 500 ml  Net -260 ml   Filed Weights   12/31/21 0234 01/01/22 0440 01/02/22 0500  Weight: 119.8 kg 119.3 kg 119.2 kg   Examination: Physical Exam:  Constitutional: WN/WD morbidly obese Caucasian female currently no acute distress but is very anxious Respiratory: Diminished to auscultation bilaterally with coarse breath sounds, no wheezing, rales, rhonchi or crackles. Normal respiratory effort and patient is not tachypenic. No accessory muscle use.  Wearing supplemental oxygen via nasal cannula Cardiovascular: RRR, no murmurs / rubs / gallops. S1 and S2 auscultated.  Has trace lower extremity edema Abdomen: Soft, non-tender, distended secondary body habitus. Bowel sounds positive.  GU: Deferred. Musculoskeletal: No clubbing / cyanosis of digits/nails. No joint deformity upper and lower extremities.  Skin: No rashes, lesions, ulcers.  Neurologic: CN 2-12 grossly intact with no focal deficits. Romberg sign and cerebellar reflexes not assessed.  Psychiatric: Normal judgment and insight. Alert and oriented x 3. Normal mood and appropriate affect.   Data Reviewed: I have personally reviewed following labs and imaging studies  CBC: Recent Labs  Lab 12/28/21 1151 12/29/21 0347 12/30/21 0359 01/01/22 0403 01/02/22 0416  WBC 10.0 12.3* 10.9* 10.2 9.1  NEUTROABS 7.9*  --   --  8.7* 7.0  HGB 14.3 16.3* 14.4 13.9 14.0  HCT 44.3 50.3* 44.8 43.1 44.3  MCV 96.5 98.1 100.4* 98.6 100.7*  PLT 172 177 161 187 416   Basic Metabolic Panel: Recent Labs  Lab 12/29/21 0347 12/30/21 0359 12/31/21 0354 01/01/22 0403 01/02/22 0416  NA 142 138 139 139 140  K 3.4* 4.3 4.3 4.6 4.5  CL 104 102 102 103 104  CO2 '27 27 29 29 29  '$ GLUCOSE 119* 89 120* 125* 101*  BUN 25* 37* 42* 47* 49*  CREATININE 1.34* 2.22* 1.86* 1.59* 1.54*  CALCIUM 8.9 8.3* 8.4* 8.6* 8.6*  MG 1.9 2.2  --  2.8* 3.1*  PHOS 2.9  --   --  2.6 3.5   GFR: Estimated Creatinine Clearance: 32.8 mL/min (A)  (by C-G formula based on SCr of 1.54 mg/dL (H)). Liver Function Tests: Recent Labs  Lab 12/28/21 1151 12/29/21 0347 01/01/22 0403 01/02/22 0416  AST 12* '18 18 18  '$ ALT '11 15 17 17  '$ ALKPHOS 52 55 48 46  BILITOT 0.9 1.2 0.7 0.8  PROT 7.2 7.6 6.6 6.4*  ALBUMIN 4.0 3.9 3.1* 2.9*   No results for input(s): "LIPASE", "AMYLASE" in the last 168 hours. No results for input(s): "AMMONIA" in the last 168 hours. Coagulation Profile: No results for input(s): "INR", "PROTIME" in the last 168 hours. Cardiac  Enzymes: No results for input(s): "CKTOTAL", "CKMB", "CKMBINDEX", "TROPONINI" in the last 168 hours. BNP (last 3 results) No results for input(s): "PROBNP" in the last 8760 hours. HbA1C: No results for input(s): "HGBA1C" in the last 72 hours. CBG: Recent Labs  Lab 01/01/22 0748 01/01/22 1206 01/01/22 1629 01/02/22 0749 01/02/22 1205  GLUCAP 117* 107* 115* 101* 118*   Lipid Profile: No results for input(s): "CHOL", "HDL", "LDLCALC", "TRIG", "CHOLHDL", "LDLDIRECT" in the last 72 hours. Thyroid Function Tests: No results for input(s): "TSH", "T4TOTAL", "FREET4", "T3FREE", "THYROIDAB" in the last 72 hours. Anemia Panel: No results for input(s): "VITAMINB12", "FOLATE", "FERRITIN", "TIBC", "IRON", "RETICCTPCT" in the last 72 hours. Sepsis Labs: Recent Labs  Lab 12/28/21 1151 12/28/21 1921 12/29/21 0116  PROCALCITON  --   --  <0.10  LATICACIDVEN 1.0 1.1  --     Recent Results (from the past 240 hour(s))  Blood culture (routine x 2)     Status: None   Collection Time: 12/28/21 11:50 AM   Specimen: BLOOD RIGHT FOREARM  Result Value Ref Range Status   Specimen Description   Final    BLOOD RIGHT FOREARM Performed at [facility], [address] [place], [place] [address]    Special Requests   Final    BOTTLES DRAWN AEROBIC AND ANAEROBIC Blood Culture adequate volume Performed at [facility], [address], [place], [place] [address]     Culture   Final    NO GROWTH 5 DAYS Performed at [facility], [place] [address] [place], [place] [address]    Report Status 01/02/2022 FINAL  Final  SARS Coronavirus 2 by RT PCR (hospital order, performed in [facility] hospital lab) *cepheid single result test* Anterior Nasal Swab     Status: None   Collection Time: 12/28/21 11:51 AM   Specimen: Anterior Nasal Swab  Result Value Ref Range Status   SARS Coronavirus 2 by RT PCR NEGATIVE NEGATIVE Final    Comment: (NOTE) SARS-CoV-2 target nucleic acids are NOT DETECTED.  The SARS-CoV-2 RNA is generally detectable in upper and lower respiratory specimens during the acute phase of infection. The lowest concentration of SARS-CoV-2 viral copies this assay can detect is 250 copies / mL. A negative result does not preclude SARS-CoV-2 infection and should not be used as the sole basis for treatment or other patient management decisions.  A negative result may occur with improper specimen collection / handling, submission of specimen other than nasopharyngeal swab, presence of viral mutation(s) within the areas targeted by this assay, and inadequate number of viral copies (<250 copies / mL). A negative result must be combined with clinical observations, patient history, and epidemiological information.  Fact Sheet for Patients:   https://www.patel.info/  Fact Sheet for Healthcare Providers: https://hall.com/  This test is not yet approved or  cleared by the [place] FDA and has been authorized for detection and/or diagnosis of SARS-CoV-2 by FDA under an Emergency Use Authorization (EUA).  This EUA will remain in effect (meaning this test can be used) for the duration of the COVID-19 declaration under Section 564(b)(1) of the Act, 21 U.S.C. section 360bbb-3(b)(1), unless the authorization is terminated or revoked sooner.  Performed at [facility], [address] [place], [place] [address]   Blood culture (routine x 2)     Status: None   Collection Time: 12/28/21  7:22 PM   Specimen: BLOOD  Result Value Ref Range Status   Specimen Description   Final  BLOOD BLOOD LEFT HAND Performed at [facility] [address] [place], [place] [address]    Special Requests   Final    IN PEDIATRIC BOTTLE Blood Culture adequate volume Performed at [facility] [address] [place], [place] [address]    Culture   Final    NO GROWTH 5 DAYS Performed at [facility], [place] [address] [place], [place] [address]    Report Status 01/02/2022 FINAL  Final    Radiology Studies: VAS [place] LOWER EXTREMITY VENOUS (DVT)  Result Date: 01/01/2022  Lower Venous DVT Study Patient Name:  Pamela [name]  Date of Exam:   12/31/2021 Medical Rec #: [id number]       Accession #:    [id number] Date of Birth: 02/16/1935        Patient Gender: F Patient Age:   66 years Exam Location:  [facility] Procedure:      VAS [place] LOWER EXTREMITY VENOUS (DVT) Referring Phys: Raiford Noble --------------------------------------------------------------------------------  Indications: Pain.  Risk Factors: PE 04/2021. Limitations: Body habitus, poor ultrasound/tissue interface and pain intolerance. Comparison Study: Previous exam on 10/09/19 was negative for DVT. Performing Technologist: Rogelia Rohrer RVT, RDMS  Examination Guidelines: A complete evaluation includes B-mode imaging, spectral Doppler, color Doppler, and power Doppler as needed of all accessible portions of each vessel. Bilateral testing is considered an integral part of a complete examination. Limited examinations for reoccurring indications may be performed as noted. The reflux portion of the exam is performed with the patient in reverse Trendelenburg.  +---------+---------------+---------+-----------+----------+--------------+ RIGHT    CompressibilityPhasicitySpontaneityPropertiesThrombus  Aging +---------+---------------+---------+-----------+----------+--------------+ CFV      Full           Yes      Yes                                 +---------+---------------+---------+-----------+----------+--------------+ SFJ      Full                                                        +---------+---------------+---------+-----------+----------+--------------+ FV Prox  Full           Yes      Yes                                 +---------+---------------+---------+-----------+----------+--------------+ FV Mid   Full           Yes      Yes                                 +---------+---------------+---------+-----------+----------+--------------+ FV DistalFull           Yes      Yes                                 +---------+---------------+---------+-----------+----------+--------------+ PFV      Full                                                        +---------+---------------+---------+-----------+----------+--------------+  POP      Full           Yes      Yes                                 +---------+---------------+---------+-----------+----------+--------------+ PTV      Full                                                        +---------+---------------+---------+-----------+----------+--------------+ PERO     Full                                                        +---------+---------------+---------+-----------+----------+--------------+   +----+---------------+---------+-----------+----------+--------------+ LEFTCompressibilityPhasicitySpontaneityPropertiesThrombus Aging +----+---------------+---------+-----------+----------+--------------+ CFV Full           Yes      Yes                                 +----+---------------+---------+-----------+----------+--------------+    Summary: RIGHT: - No evidence of common femoral vein obstruction. - No cystic structure found in the popliteal fossa.  LEFT: - No evidence  of common femoral vein obstruction.  *See table(s) above for measurements and observations. Electronically signed by Jamelle Haring on 01/01/2022 at 9:44:57 AM.    Final      Scheduled Meds:  apixaban  2.5 mg Oral BID   diclofenac Sodium  2 g Topical QID   hydrALAZINE  75 mg Oral Q8H   insulin aspart  0-9 Units Subcutaneous TID WC   metoprolol succinate  50 mg Oral QPM   pravastatin  20 mg Oral q1800   traZODone  100 mg Oral QHS   Continuous Infusions:   LOS: 5 days   Raiford Noble, DO [facility] Available via Epic secure chat 7am-7pm After these hours, please refer to coverage provider listed on [email or web address] 01/02/2022, 1:29 PM

## 2022-01-03 DIAGNOSIS — N1832 Chronic kidney disease, stage 3b: Secondary | ICD-10-CM | POA: Diagnosis not present

## 2022-01-03 DIAGNOSIS — I480 Paroxysmal atrial fibrillation: Secondary | ICD-10-CM | POA: Diagnosis not present

## 2022-01-03 DIAGNOSIS — I5023 Acute on chronic systolic (congestive) heart failure: Secondary | ICD-10-CM | POA: Diagnosis not present

## 2022-01-03 DIAGNOSIS — Y92009 Unspecified place in unspecified non-institutional (private) residence as the place of occurrence of the external cause: Secondary | ICD-10-CM | POA: Diagnosis not present

## 2022-01-03 DIAGNOSIS — R7989 Other specified abnormal findings of blood chemistry: Secondary | ICD-10-CM | POA: Diagnosis not present

## 2022-01-03 DIAGNOSIS — W19XXXA Unspecified fall, initial encounter: Secondary | ICD-10-CM | POA: Diagnosis not present

## 2022-01-03 DIAGNOSIS — I161 Hypertensive emergency: Secondary | ICD-10-CM | POA: Diagnosis not present

## 2022-01-03 DIAGNOSIS — I10 Essential (primary) hypertension: Secondary | ICD-10-CM | POA: Diagnosis not present

## 2022-01-03 DIAGNOSIS — E782 Mixed hyperlipidemia: Secondary | ICD-10-CM | POA: Diagnosis not present

## 2022-01-03 DIAGNOSIS — M25562 Pain in left knee: Secondary | ICD-10-CM | POA: Diagnosis not present

## 2022-01-03 LAB — CBC WITH DIFFERENTIAL/PLATELET
Abs Immature Granulocytes: 0.02 10*3/uL (ref 0.00–0.07)
Basophils Absolute: 0.1 10*3/uL (ref 0.0–0.1)
Basophils Relative: 1 %
Eosinophils Absolute: 0.3 10*3/uL (ref 0.0–0.5)
Eosinophils Relative: 3 %
HCT: 45.5 % (ref 36.0–46.0)
Hemoglobin: 14.6 g/dL (ref 12.0–15.0)
Immature Granulocytes: 0 %
Lymphocytes Relative: 12 %
Lymphs Abs: 1 10*3/uL (ref 0.7–4.0)
MCH: 31.9 pg (ref 26.0–34.0)
MCHC: 32.1 g/dL (ref 30.0–36.0)
MCV: 99.3 fL (ref 80.0–100.0)
Monocytes Absolute: 0.7 10*3/uL (ref 0.1–1.0)
Monocytes Relative: 8 %
Neutro Abs: 6.6 10*3/uL (ref 1.7–7.7)
Neutrophils Relative %: 76 %
Platelets: 210 10*3/uL (ref 150–400)
RBC: 4.58 MIL/uL (ref 3.87–5.11)
RDW: 13.5 % (ref 11.5–15.5)
WBC: 8.6 10*3/uL (ref 4.0–10.5)
nRBC: 0 % (ref 0.0–0.2)

## 2022-01-03 LAB — COMPREHENSIVE METABOLIC PANEL
ALT: 18 U/L (ref 0–44)
AST: 18 U/L (ref 15–41)
Albumin: 3.2 g/dL — ABNORMAL LOW (ref 3.5–5.0)
Alkaline Phosphatase: 44 U/L (ref 38–126)
Anion gap: 7 (ref 5–15)
BUN: 53 mg/dL — ABNORMAL HIGH (ref 8–23)
CO2: 31 mmol/L (ref 22–32)
Calcium: 8.6 mg/dL — ABNORMAL LOW (ref 8.9–10.3)
Chloride: 101 mmol/L (ref 98–111)
Creatinine, Ser: 1.46 mg/dL — ABNORMAL HIGH (ref 0.44–1.00)
GFR, Estimated: 35 mL/min — ABNORMAL LOW (ref >60)
Glucose, Bld: 106 mg/dL — ABNORMAL HIGH (ref 70–99)
Potassium: 4.5 mmol/L (ref 3.5–5.1)
Sodium: 139 mmol/L (ref 135–145)
Total Bilirubin: 0.8 mg/dL (ref 0.3–1.2)
Total Protein: 6.7 g/dL (ref 6.5–8.1)

## 2022-01-03 LAB — MAGNESIUM: Magnesium: 2.9 mg/dL — ABNORMAL HIGH (ref 1.7–2.4)

## 2022-01-03 LAB — GLUCOSE, CAPILLARY
Glucose-Capillary: 117 mg/dL — ABNORMAL HIGH (ref 70–99)
Glucose-Capillary: 104 mg/dL — ABNORMAL HIGH (ref 70–99)

## 2022-01-03 LAB — PHOSPHORUS: Phosphorus: 3.4 mg/dL (ref 2.5–4.6)

## 2022-01-03 MED ORDER — APIXABAN 5 MG PO TABS: 5.0000 mg | ORAL_TABLET | Freq: Two times a day (BID) | ORAL | 0 refills | Status: DC

## 2022-01-03 MED ORDER — HYDRALAZINE HCL 25 MG PO TABS: 75.0000 mg | ORAL_TABLET | Freq: Three times a day (TID) | ORAL | 0 refills | Status: DC

## 2022-01-03 MED ORDER — APIXABAN 5 MG PO TABS: 5.0000 mg | ORAL_TABLET | Freq: Two times a day (BID) | ORAL | Status: DC

## 2022-01-03 MED ORDER — DICLOFENAC SODIUM 1 % EX GEL: 2.0000 g | Freq: Four times a day (QID) | CUTANEOUS | 0 refills | Status: AC

## 2022-01-03 NOTE — Progress Notes (Signed)
ANTICOAGULATION CONSULT NOTE - Initial Consult  Pharmacy Consult for Eliquis Indication: atrial fibrillation  Allergies  Allergen Reactions   Levaquin [Levofloxacin In D5w] Nausea And Vomiting   Lisinopril Cough    Patient Measurements: Height: '5\' 2"'$  (157.5 cm) Weight: 118.6 kg (261 lb 7.5 oz) IBW/kg (Calculated) : 50.1  Vital Signs: Temp: 98 F (36.7 C) (07/03 0407) Temp Source: Oral (07/03 0407) BP: 165/78 (07/03 0407) Pulse Rate: 60 (07/03 0407)  Labs: Recent Labs    01/01/22 0403 01/02/22 0416 01/03/22 0559  HGB 13.9 14.0 14.6  HCT 43.1 44.3 45.5  PLT 187 214 210  CREATININE 1.59* 1.54* 1.46*     Estimated Creatinine Clearance: 34.5 mL/min (A) (by C-G formula based on SCr of 1.46 mg/dL (H)).   Medical History: Past Medical History:  Diagnosis Date   Acute diastolic CHF (congestive heart failure) ([place]) 10/11/2019   Arthritis    Bilateral lower extremity edema    left lower extremity cellulitis   Common bile duct stone 06/22/2017   Diabetes ([place])    Diverticulosis    Duodenal diverticulum, periampullary 11/03/2018   Aborted ERCP 2016   GERD (gastroesophageal reflux disease)    History of hiatal hernia    Hypertension    Kidney disease, chronic, stage III (GFR 30-59 ml/min) (HCC)    Obesity    Osteopenia    Seizures ([place]) 22 yrs ago   no cause found   Tubular adenoma of colon 2009   Assessment: AC/Heme:  - hx provoked submassive PE in Dec 2022. She was on on Eliquis but last filled date was 09/07/21 -- stopped after 6 mo of txment - CHA2DS2-VASc score of 6  - No surgical intervention for knee required. - Scr is continuing to improve. Today down to 1.46. Scr baseline is usually around 1.3   Goal of Therapy:  Therapeutic oral anticoagulation   Plan:  -  Copay for Eliquis is $47.00 - was on Eliquis before so can't use the the free coupon again++ - Increase  Eliquis  to '5mg'$  BID with improvement in renal function      Royetta Asal, PharmD,  BCPS 01/03/2022 10:24 AM

## 2022-01-03 NOTE — Discharge Summary (Signed)
Physician Discharge Summary   Patient: Pamela [name] MRN: [id number] DOB: 1935-04-23  Admit date:     12/28/2021  Discharge date: 01/03/22  Discharge Physician: Raiford Noble, DO   PCP: Orpah Melter, MD   Recommendations at discharge:   Follow-up with cardiology in outpatient setting within 1 to 2 weeks and resume Lasix further evaluation and recommendations Follow-up with orthopedic surgery in the outpatient setting Follow-up with PCP within 1 to 2 weeks and repeat CBC, CMP, mag, Phos within 1 week Repeat chest x-ray in 3 to 6 weeks  Discharge Diagnoses: Principal Problem:   Acute on chronic systolic CHF (congestive heart failure) (HCC) Active Problems:   Essential hypertension   Mixed hyperlipidemia   CKD (chronic kidney disease) stage 3, GFR 30-59 ml/min (HCC)   Obesity, Class III, BMI 40-49.9 (morbid obesity) ([place])   Type 2 diabetes mellitus, without long-term current use of insulin (HCC)   Hypertensive emergency   CAP (community acquired pneumonia)   Left knee pain   Fall at home, initial encounter   Elevated d-dimer   PAF (paroxysmal atrial fibrillation) ([place])  Resolved Problems:   * No resolved hospital problems. *  Hospital Course: Pamela [name] is an 87 y.o. female with a history of PE in setting of covid-19 infection, chronic HFrEF, stage IIIb CKD, HTN, HLD, T2DM who fell at home with subsequent left knee pain.   She was hypertensive to 214/104, tachypneic and hypoxemic requiring 2L O2. CBC was normal, SCr 1.47 (baseline 1.3-1.4). BNP 216.2, D-dimer 0.69, lactic acid negative x2, troponin negative x2. Bilateral opacities noted on CXR favoring pulmonary edema. Left knee XR revealed severe, progressive osteoarthritis with joint effusion and possible intra-articular loose bodies.   IV lasix started for acute HFrEF causing acute hypoxic respiratory failure. Antibiotics initially given though pneumonia is felt to be ruled out. She converted to rapid AFib on 6/28 which  converted with IV metoprolol. Cardiology is consulted and recommending holding diuretics still given worsened Renal Fxn. Orthopedics is consulted for left knee abnormalities including effusion and intraarticular loose bodies confirmed on CT in the a.m. attempted arthrocentesis but this was unsuccessful for day just give her steroid injection.  She is improving renal function is improved.  PT OT still recommending SNF for strengthening rehab prior to safe return home with spouse.  Patient's weakness was slowly improving and she did increase her ambulation however today she did in better and PT recommended going home health instead of SNF.  Patient was deemed medically stable to be discharged given that she is awaiting SNF and she will follow-up with her PCP, orthopedic surgeon as well as cardiology in outpatient setting.  She is much improved  Assessment and Plan: No notes have been filed under this hospital service. Service: Hospitalist  Acute on Chronic HFrEF, improving  -LVEF is stable, 45-50%, based on repeat echo.  - Euvolemic after IV lasix, creatinine bump noted, stopped lasix.  -Continue to Monitor Strict I/O's and daily weights  -Patient is - .3.967 Liters since admission and Weight is down from 122 kg -> 119.3 -> 118.6 kg -With AKI, will hold dapagliflozin but resume at discharge -Continue metoprolol succinate 50 mg po qHS -Initiation of further GDMT limited by renal impairment at this time.  -She is now on hydralazine 75 mg p.o. 3 times daily -Continue to monitor volume status carefully and cardiology recommends resuming p.o. Lasix 20 mg daily once her renal function returns back to baseline (Cr of 1.3) -Lasix can be resumed in  the outpatient setting per cardiology -She is medically stable to be discharged at this time   Atrial fibrillation with RVR:  -Asymptomatic, so unclear when this first occurred, initially noted the day after admission. TSH 3.013.  - Given hx LV systolic  dysfunction, not using CCB. Continue metoprolol.  - CHA2DS2-VASc score is 6, pt has tolerated eliquis in the past, anticipate starting this once cleared by orthopedics to do so. This was discussed with pt and spouse and she was initiated on Eliquis 2.5 mg p.o. twice daily but will go to 5 mg po BID -Follow-up with PCP and cardiology outpatient setting   HTN Urgency:  -Pain contributing, but still uncontrolled and was significantly elevated this morning due to anxiety - Continue increased metoprolol succinate - ARB/spiro unable to start in setting of AKI, so Hydralazine was started and this was increased to 75 mg 3 times daily -Continue monitor blood pressures per protocol and continue with IV hydralazine 10 mg IV every 6 as needed prn as well -Last blood pressure reading was 161/75   Left knee OA and effusion: -Concern for loose bodies in the joint by XR and CT. - Orthopedics consulted, seem to be planning arthrocentesis but could not be done and instead received a steroid injection.  - Multimodal pain control. -PT OT recommending SNF and TOC assisting with placement and awaiting bed placement however she progressed to the point where she did not needs SNF and can go home with home health PT which is good to be done today   AKI on stage IIIb CKD:  -Cr up with diuresis but trending back down   - Stopping lasix, consider further work up if not improving.  - Avoid nephrotoxins contrast dyes, hypotension, and dehydration to ensure adequate renal perfusion and renally adjust medications -Was going to restart dapagliflozin at lower dose, but will hold this for now with AKI.  -We will hold resuming her Lasix until renal function is back to her baseline -BUN/Cr is improving and went from 37/2.22 -> 42/1.86 -> 47/1.59 -> 49/1.54 -> 53/1.46 and follow-up in outpatient setting -Continue to Monitor Renal Fxn Carefully and repeat CMP within 1 week   Pneumonia  -Felt to be ruled out as pt is afebrile  without cough or leukocytosis on arrival. PCT undetectable. We will monitor cultures off antibiotics   Well-controlled NIDT2DM -HbA1c 6.1%.  -SSI with sensitive NovoLog sign scale insulin AC -CBGs ranging from 104-136   HLD:  -Continue Pravastatin 20 mg po Daily    History of provoked PE (covid-19 infection):  -Had completed anticoagulation. Did not have bleeding complications. Age-adjusted D-dimer is negative here.  -Resumed AC due to A Fib    Hypoalbuminemia -Patient's Albumin Level went from 2.9 -> 3.2  -Continue to Monitor and Trend and Repeat CMP in the AM   Insomnia:  -Reordered home Trazodone 100 mg po qHS -Still not sleeping very well given her extreme anxiousness   Morbid Obesity -Complicates overall prognosis and care -Estimated body mass index is 47.82 kg/m as calculated from the following:   Height as of this encounter: '5\' 2"'$  (1.575 m).   Weight as of this encounter: 118.6 kg.  -Weight Loss and Dietary Counseling given   Consultants: Cardiology, Orthopedic Surgery  Procedures performed: ECHO; Right Leg Venous Duplex; Attempted Arthrocentesis and Cortisone Injection   Disposition: Home health Diet recommendation:  Discharge Diet Orders (From admission, onward)     Start     Ordered   01/03/22 0000  Diet -  low sodium heart healthy        01/03/22 1603   01/03/22 0000  Diet Carb Modified        01/03/22 1603           Cardiac and Carb modified diet DISCHARGE MEDICATION: Allergies as of 01/03/2022       Reactions   Levaquin [levofloxacin In D5w] Nausea And Vomiting   Lisinopril Cough        Medication List     STOP taking these medications    fluticasone 50 MCG/ACT nasal spray Commonly known as: FLONASE       TAKE these medications    acetaminophen 500 MG tablet Commonly known as: TYLENOL Take 1 tablet (500 mg total) by mouth every 6 (six) hours as needed for moderate pain.   Align 4 MG Caps Take 4 mg by mouth daily.   apixaban 5  MG Tabs tablet Commonly known as: ELIQUIS Take 1 tablet (5 mg total) by mouth 2 (two) times daily.   calcium carbonate 1500 (600 Ca) MG Tabs tablet Commonly known as: OSCAL Take 1,500 mg by mouth 2 (two) times daily with a meal.   dapagliflozin propanediol 10 MG Tabs tablet Commonly known as: FARXIGA Take 10 mg by mouth daily.   diclofenac Sodium 1 % Gel Commonly known as: VOLTAREN Apply 2 g topically 4 (four) times daily.   furosemide 20 MG tablet Commonly known as: Lasix Take 1 tablet (20 mg total) by mouth daily as needed for edema (or weight gain).   hydrALAZINE 25 MG tablet Commonly known as: APRESOLINE Take 3 tablets (75 mg total) by mouth every 8 (eight) hours.   lovastatin 20 MG tablet Commonly known as: MEVACOR Take 20 mg by mouth at bedtime.   metoprolol succinate 25 MG 24 hr tablet Commonly known as: TOPROL-XL Take 25 mg by mouth daily.   OneTouch Delica Plus [id number] Misc   OVER THE COUNTER MEDICATION Take 1 capsule by mouth daily. Daily fiber 100% psyllium.   traZODone 50 MG tablet Commonly known as: DESYREL Take 50 mg by mouth at bedtime as needed for sleep.        Discharge Exam: Filed Weights   01/01/22 0440 01/02/22 0500 01/03/22 0350  Weight: 119.3 kg 119.2 kg 118.6 kg   Vitals:   01/03/22 1610 01/03/22 1750  BP:    Pulse:    Resp:    Temp:    SpO2: 91% 93%   Examination from this AM : Physical Exam:  Constitutional: WN/WD morbidly obese Caucasian female in NAD  Respiratory: Diminished to auscultation bilaterally with coarse berath sounds, no wheezing, rales, rhonchi or crackles. Normal respiratory effort and patient is not tachypenic. No accessory muscle use. Unlabored breathing but wearing supplemental O2 via [place] Cardiovascular: RRR, no murmurs / rubs / gallops. S1 and S2 auscultated. Mild LE Edema Abdomen: Soft, non-tender, Distended 2/2 body habitus. Bowel sounds positive.  GU: Deferred. Musculoskeletal: No clubbing / cyanosis of  digits/nails. No joint deformity upper and lower extremities.  Skin: No rashes, lesions, ulcers on a limited skin evaluation. No induration; Warm and dry.  Neurologic: CN 2-12 grossly intact with no focal deficits. Romberg sign cerebellar reflexes not assessed.  Psychiatric: Normal judgment and insight. Alert and oriented x 3. Not as anxious today.    Condition at discharge: stable  The results of significant diagnostics from this hospitalization (including imaging, microbiology, ancillary and laboratory) are listed below for reference.   Imaging Studies: VAS [place] LOWER EXTREMITY VENOUS (  DVT)  Result Date: 01/01/2022  Lower Venous DVT Study Patient Name:  AMARII AMY  Date of Exam:   12/31/2021 Medical Rec #: [id number]       Accession #:    [id number] Date of Birth: 12/31/1934        Patient Gender: F Patient Age:   83 years Exam Location:  [facility] Procedure:      VAS [place] LOWER EXTREMITY VENOUS (DVT) Referring Phys: Raiford Noble --------------------------------------------------------------------------------  Indications: Pain.  Risk Factors: PE 04/2021. Limitations: Body habitus, poor ultrasound/tissue interface and pain intolerance. Comparison Study: Previous exam on 10/09/19 was negative for DVT. Performing Technologist: Rogelia Rohrer RVT, RDMS  Examination Guidelines: A complete evaluation includes B-mode imaging, spectral Doppler, color Doppler, and power Doppler as needed of all accessible portions of each vessel. Bilateral testing is considered an integral part of a complete examination. Limited examinations for reoccurring indications may be performed as noted. The reflux portion of the exam is performed with the patient in reverse Trendelenburg.  +---------+---------------+---------+-----------+----------+--------------+ RIGHT    CompressibilityPhasicitySpontaneityPropertiesThrombus Aging +---------+---------------+---------+-----------+----------+--------------+ CFV      Full            Yes      Yes                                 +---------+---------------+---------+-----------+----------+--------------+ SFJ      Full                                                        +---------+---------------+---------+-----------+----------+--------------+ FV Prox  Full           Yes      Yes                                 +---------+---------------+---------+-----------+----------+--------------+ FV Mid   Full           Yes      Yes                                 +---------+---------------+---------+-----------+----------+--------------+ FV DistalFull           Yes      Yes                                 +---------+---------------+---------+-----------+----------+--------------+ PFV      Full                                                        +---------+---------------+---------+-----------+----------+--------------+ POP      Full           Yes      Yes                                 +---------+---------------+---------+-----------+----------+--------------+ PTV      Full                                                        +---------+---------------+---------+-----------+----------+--------------+  PERO     Full                                                        +---------+---------------+---------+-----------+----------+--------------+   +----+---------------+---------+-----------+----------+--------------+ LEFTCompressibilityPhasicitySpontaneityPropertiesThrombus Aging +----+---------------+---------+-----------+----------+--------------+ CFV Full           Yes      Yes                                 +----+---------------+---------+-----------+----------+--------------+    Summary: RIGHT: - No evidence of common femoral vein obstruction. - No cystic structure found in the popliteal fossa.  LEFT: - No evidence of common femoral vein obstruction.  *See table(s) above for measurements and observations.  Electronically signed by Jamelle Haring on 01/01/2022 at 9:44:57 AM.    Final    CT KNEE LEFT WO CONTRAST  Result Date: 12/30/2021 CLINICAL DATA:  Left knee pain EXAM: CT OF THE LEFT KNEE WITHOUT CONTRAST TECHNIQUE: Multidetector CT imaging of the left knee was performed according to the standard protocol. Multiplanar CT image reconstructions were also generated. RADIATION DOSE REDUCTION: This exam was performed according to the departmental dose-optimization program which includes automated exposure control, adjustment of the mA and/or kV according to patient size and/or use of iterative reconstruction technique. COMPARISON:  X-ray 12/28/2021 FINDINGS: Bones/Joint/Cartilage No acute fracture or dislocation. Severe tricompartmental osteoarthritis of the left knee with bone-on-bone apposition in the medial compartment. Bulky tricompartmental marginal osteophytes. Small joint effusion with several small intra-articular loose bodies within the suprapatellar pouch. Bones are demineralized. Ligaments Suboptimally assessed by CT. Muscles and Tendons Generalized muscle atrophy. No acute musculotendinous abnormality by CT. Soft tissues Focal soft tissue swelling and edema within the infrapatellar subcutaneous soft tissues. Scattered vascular calcifications. IMPRESSION: 1. No acute fracture or dislocation of the left knee. 2. Severe tricompartmental osteoarthritis of the left knee. 3. Small joint effusion and several small intra-articular loose bodies. Electronically Signed   By: Davina Poke D.O.   On: 12/30/2021 10:02   ECHOCARDIOGRAM COMPLETE  Result Date: 12/30/2021    ECHOCARDIOGRAM REPORT   Patient Name:   ADMIRE BUNNELL Date of Exam: 12/29/2021 Medical Rec #:  [id number]      Height:       62.0 in Accession #:    [id number]     Weight:       269.0 lb Date of Birth:  02-02-1935       BSA:          2.168 m Patient Age:    48 years       BP:           116/81 mmHg Patient Gender: F              HR:           70 bpm.  Exam Location:  Inpatient Procedure: 2D Echo, Color Doppler and Cardiac Doppler                              MODIFIED REPORT: This report was modified by Candee Furbish MD on 12/30/2021 due to Reduced EF.  Indications:     CHF- acute diastolic  History:         Patient has  prior history of Echocardiogram examinations, most                  recent 08/02/2021. HFrEF; Risk Factors:Hypertension, Diabetes                  and HLD.  Sonographer:     Joette Catching RCS Referring Phys:  [id number] OLADAPO ADEFESO Diagnosing Phys: Candee Furbish MD  Sonographer Comments: Study done supine. IMPRESSIONS  1. Left ventricular ejection fraction, by estimation, is 45 to 50%. The left ventricle has mildly decreased function. The left ventricle demonstrates global hypokinesis. There is mild left ventricular hypertrophy. Left ventricular diastolic parameters are consistent with Grade II diastolic dysfunction (pseudonormalization).  2. Right ventricular systolic function is normal. The right ventricular size is normal.  3. The mitral valve is normal in structure. No evidence of mitral valve regurgitation. No evidence of mitral stenosis. Moderate mitral annular calcification.  4. The aortic valve is normal in structure. Aortic valve regurgitation is not visualized. No aortic stenosis is present.  5. The inferior vena cava is normal in size with greater than 50% respiratory variability, suggesting right atrial pressure of 3 mmHg. FINDINGS  Left Ventricle: Left ventricular ejection fraction, by estimation, is 45 to 50%. The left ventricle has mildly decreased function. The left ventricle demonstrates global hypokinesis. The left ventricular internal cavity size was normal in size. There is  mild left ventricular hypertrophy. Left ventricular diastolic parameters are consistent with Grade II diastolic dysfunction (pseudonormalization). Right Ventricle: The right ventricular size is normal. No increase in right ventricular wall thickness. Right  ventricular systolic function is normal. Left Atrium: Left atrial size was normal in size. Right Atrium: Right atrial size was normal in size. Pericardium: There is no evidence of pericardial effusion. Mitral Valve: The mitral valve is normal in structure. Moderate mitral annular calcification. No evidence of mitral valve regurgitation. No evidence of mitral valve stenosis. Tricuspid Valve: The tricuspid valve is normal in structure. Tricuspid valve regurgitation is mild . No evidence of tricuspid stenosis. Aortic Valve: The aortic valve is normal in structure. Aortic valve regurgitation is not visualized. No aortic stenosis is present. Aortic valve mean gradient measures 5.0 mmHg. Aortic valve peak gradient measures 8.8 mmHg. Aortic valve area, by VTI measures 2.30 cm. Pulmonic Valve: The pulmonic valve was normal in structure. Pulmonic valve regurgitation is trivial. No evidence of pulmonic stenosis. Aorta: The aortic root is normal in size and structure. Venous: The inferior vena cava is normal in size with greater than 50% respiratory variability, suggesting right atrial pressure of 3 mmHg. IAS/Shunts: No atrial level shunt detected by color flow Doppler.  LEFT VENTRICLE PLAX 2D LVIDd:         3.70 cm   Diastology LVIDs:         2.70 cm   LV e' medial:    5.22 cm/s LV PW:         1.30 cm   LV E/e' medial:  18.0 LV IVS:        1.30 cm   LV e' lateral:   8.92 cm/s LVOT diam:     2.00 cm   LV E/e' lateral: 10.5 LV SV:         75 LV SV Index:   34 LVOT Area:     3.14 cm  RIGHT VENTRICLE            IVC RV Basal diam:  2.80 cm    IVC diam: 2.20 cm RV Mid diam:  1.20 cm RV S prime:     9.68 cm/s TAPSE (M-mode): 1.5 cm LEFT ATRIUM             Index        RIGHT ATRIUM           Index LA diam:        3.60 cm 1.66 cm/m   RA Area:     13.10 cm LA Vol (A2C):   31.8 ml 14.67 ml/m  RA Volume:   30.40 ml  14.02 ml/m LA Vol (A4C):   57.9 ml 26.70 ml/m LA Biplane Vol: 44.0 ml 20.29 ml/m  AORTIC VALVE                      PULMONIC VALVE AV Area (Vmax):    2.53 cm      PV Vmax:          1.15 m/s AV Area (Vmean):   2.32 cm      PV Peak grad:     5.3 mmHg AV Area (VTI):     2.30 cm      PR End Diast Vel: 6.35 msec AV Vmax:           148.00 cm/s AV Vmean:          112.000 cm/s AV VTI:            0.325 m AV Peak Grad:      8.8 mmHg AV Mean Grad:      5.0 mmHg LVOT Vmax:         119.00 cm/s LVOT Vmean:        82.600 cm/s LVOT VTI:          0.238 m LVOT/AV VTI ratio: 0.73  AORTA Ao Root diam: 2.90 cm Ao Asc diam:  3.40 cm MITRAL VALVE               TRICUSPID VALVE MV Area (PHT): 2.57 cm    TR Peak grad:   28.3 mmHg MV Decel Time: 296 msec    TR Vmax:        266.00 cm/s MV E velocity: 94.00 cm/s MV A velocity: 41.93 cm/s  SHUNTS MV E/A ratio:  2.24        Systemic VTI:  0.24 m                            Systemic Diam: 2.00 cm Candee Furbish MD Electronically signed by Candee Furbish MD Signature Date/Time: 12/29/2021/2:37:29 PM    Final (Updated)    DG Chest Port 1 View  Result Date: 12/28/2021 CLINICAL DATA:  Cough EXAM: PORTABLE CHEST 1 VIEW COMPARISON:  Chest x-ray dated April 30, 2021 FINDINGS: Cardiac and mediastinal contours are unchanged. Unchanged elevation of the right hemidiaphragm. Mild bilateral heterogeneous pulmonary opacities. No large pleural effusion or pneumothorax. IMPRESSION: Mild bilateral heterogeneous pulmonary opacities, possibly due to pulmonary edema. Electronically Signed   By: Yetta Glassman M.D.   On: 12/28/2021 12:38   DG Knee Complete 4 Views Left  Result Date: 12/28/2021 CLINICAL DATA:  Fall.  Knee pain. EXAM: LEFT KNEE - COMPLETE 4+ VIEW COMPARISON:  08/18/2015 FINDINGS: Progressive advanced tricompartmental osteoarthritis with joint space narrowing and marginal osteophytes. Joint effusion, possibly with intra-articular loose bodies. No acute radiographic finding. IMPRESSION: Advanced and progressive tricompartmental osteoarthritis. Joint effusion with possible intra-articular loose bodies.  Electronically Signed   By: Jan Fireman.D.  On: 12/28/2021 12:37    Microbiology: Results for orders placed or performed during the hospital encounter of 12/28/21  Blood culture (routine x 2)     Status: None   Collection Time: 12/28/21 11:50 AM   Specimen: BLOOD RIGHT FOREARM  Result Value Ref Range Status   Specimen Description   Final    BLOOD RIGHT FOREARM Performed at [facility], [address] [place], [place] [address]    Special Requests   Final    BOTTLES DRAWN AEROBIC AND ANAEROBIC Blood Culture adequate volume Performed at [facility], [address], [place], [place] [address]    Culture   Final    NO GROWTH 5 DAYS Performed at [facility], [place] [address], [place], [place] [address]    Report Status 01/02/2022 FINAL  Final  SARS Coronavirus 2 by RT PCR (hospital order, performed in [facility] hospital lab) *cepheid single result test* Anterior Nasal Swab     Status: None   Collection Time: 12/28/21 11:51 AM   Specimen: Anterior Nasal Swab  Result Value Ref Range Status   SARS Coronavirus 2 by RT PCR NEGATIVE NEGATIVE Final    Comment: (NOTE) SARS-CoV-2 target nucleic acids are NOT DETECTED.  The SARS-CoV-2 RNA is generally detectable in upper and lower respiratory specimens during the acute phase of infection. The lowest concentration of SARS-CoV-2 viral copies this assay can detect is 250 copies / mL. A negative result does not preclude SARS-CoV-2 infection and should not be used as the sole basis for treatment or other patient management decisions.  A negative result may occur with improper specimen collection / handling, submission of specimen other than nasopharyngeal swab, presence of viral mutation(s) within the areas targeted by this assay, and inadequate number of viral copies (<250 copies / mL). A negative result must be combined with clinical observations, patient history, and epidemiological  information.  Fact Sheet for Patients:   https://www.patel.info/  Fact Sheet for Healthcare Providers: https://hall.com/  This test is not yet approved or  cleared by the [place] FDA and has been authorized for detection and/or diagnosis of SARS-CoV-2 by FDA under an Emergency Use Authorization (EUA).  This EUA will remain in effect (meaning this test can be used) for the duration of the COVID-19 declaration under Section 564(b)(1) of the Act, 21 U.S.C. section 360bbb-3(b)(1), unless the authorization is terminated or revoked sooner.  Performed at [facility], [address] [place], [place] [address]   Blood culture (routine x 2)     Status: None   Collection Time: 12/28/21  7:22 PM   Specimen: BLOOD  Result Value Ref Range Status   Specimen Description   Final    BLOOD BLOOD LEFT HAND Performed at [facility] [address], [place], [place] [address]    Special Requests   Final    IN PEDIATRIC BOTTLE Blood Culture adequate volume Performed at [facility] [address] [place],  [address]    Culture   Final    NO GROWTH 5 DAYS Performed at [facility], [place] [address], [place],  [address]    Report Status 01/02/2022 FINAL  Final    Labs: CBC: Recent Labs  Lab 12/28/21 1151 12/29/21 0347 12/30/21 0359 01/01/22 0403 01/02/22 0416 01/03/22 0559  WBC 10.0 12.3* 10.9* 10.2 9.1 8.6  NEUTROABS 7.9*  --   --  8.7* 7.0 6.6  HGB 14.3 16.3* 14.4 13.9 14.0 14.6  HCT 44.3 50.3* 44.8 43.1 44.3 45.5  MCV 96.5 98.1 100.4* 98.6 100.7* 99.3  PLT 172 177 161 187 214 735   Basic Metabolic Panel: Recent Labs  Lab 12/29/21 0347 12/30/21 0359 12/31/21 0354 01/01/22 0403 01/02/22 0416 01/03/22 0559  NA 142 138 139 139 140 139  K 3.4* 4.3 4.3 4.6 4.5 4.5  CL 104 102 102 103 104 101  CO2 '27 27 29 29 29 31  '$ GLUCOSE 119* 89 120* 125* 101* 106*   BUN 25* 37* 42* 47* 49* 53*  CREATININE 1.34* 2.22* 1.86* 1.59* 1.54* 1.46*  CALCIUM 8.9 8.3* 8.4* 8.6* 8.6* 8.6*  MG 1.9 2.2  --  2.8* 3.1* 2.9*  PHOS 2.9  --   --  2.6 3.5 3.4   Liver Function Tests: Recent Labs  Lab 12/28/21 1151 12/29/21 0347 01/01/22 0403 01/02/22 0416 01/03/22 0559  AST 12* '18 18 18 18  '$ ALT '11 15 17 17 18  '$ ALKPHOS 52 55 48 46 44  BILITOT 0.9 1.2 0.7 0.8 0.8  PROT 7.2 7.6 6.6 6.4* 6.7  ALBUMIN 4.0 3.9 3.1* 2.9* 3.2*   CBG: Recent Labs  Lab 01/02/22 1205 01/02/22 1639 01/02/22 2114 01/03/22 0744 01/03/22 1142  GLUCAP 118* 136* 113* 117* 104*   Discharge time spent: greater than 30 minutes.  Signed: Raiford Noble, DO [facility] 01/03/2022

## 2022-01-03 NOTE — Care Management Important Message (Signed)
Important Message  Patient Details IM Letter given to the Patient. Name: Pamela [name] MRN: [id number] Date of Birth: 11/18/1934   Medicare Important Message Given:  Yes     Kerin Salen 01/03/2022, 1:43 PM

## 2022-01-03 NOTE — Progress Notes (Signed)
PROGRESS NOTE    Pamela [name]  XFG:[id number] DOB: Nov 07, 1934 DOA: 12/28/2021 PCP: Orpah Melter, MD   Brief Narrative:  Pamela [name] is an 87 y.o. female with a history of PE in setting of covid-19 infection, chronic HFrEF, stage IIIb CKD, HTN, HLD, T2DM who fell at home with subsequent left knee pain.   She was hypertensive to 214/104, tachypneic and hypoxemic requiring 2L O2. CBC was normal, SCr 1.47 (baseline 1.3-1.4). BNP 216.2, D-dimer 0.69, lactic acid negative x2, troponin negative x2. Bilateral opacities noted on CXR favoring pulmonary edema. Left knee XR revealed severe, progressive osteoarthritis with joint effusion and possible intra-articular loose bodies.   IV lasix started for acute HFrEF causing acute hypoxic respiratory failure. Antibiotics initially given though pneumonia is felt to be ruled out. She converted to rapid AFib on 6/28 which converted with IV metoprolol. Cardiology is consulted and recommending holding diuretics still given worsened Renal Fxn. Orthopedics is consulted for left knee abnormalities including effusion and intraarticular loose bodies confirmed on CT in the a.m. attempted arthrocentesis but this was unsuccessful for day just give her steroid injection.  She is improving renal function is improved.  PT OT still recommending SNF for strengthening rehab prior to safe return home with spouse.  Patient's weakness is improving slowly and she did increase her ambulation to 10 feet the day before yesterday.   Assessment and Plan:  Acute on Chronic HFrEF, improving  -LVEF is stable, 45-50%, based on repeat echo.  - Euvolemic after IV lasix, creatinine bump noted, stopped lasix.  -Continue to Monitor Strict I/O's and daily weights  -Patient is - .3.967 Liters since admission and Weight is down from 122 kg -> 119.3 -> 118.6 kg -With AKI, will hold dapagliflozin -Continue metoprolol succinate 50 mg po qHS -Initiation of further GDMT limited by renal impairment  at this time.  -She is now on hydralazine 75 mg p.o. 3 times daily -Continue to monitor volume status carefully and cardiology recommends resuming p.o. Lasix 20 mg daily once her renal function returns back to baseline (Cr of 1.3)   Atrial fibrillation with RVR:  -Asymptomatic, so unclear when this first occurred, initially noted the day after admission. TSH 3.013.  - Given hx LV systolic dysfunction, not using CCB. Continue metoprolol.  - CHA2DS2-VASc score is 6, pt has tolerated eliquis in the past, anticipate starting this once cleared by orthopedics to do so. This was discussed with pt and spouse and she was initiated on Eliquis 2.5 mg p.o. twice daily but will go to 5 mg po BID   HTN Urgency:  -Pain contributing, but still uncontrolled and was significantly elevated this morning due to anxiety - Continue increased metoprolol succinate - ARB/spiro unable to start in setting of AKI, so Hydralazine was started and this was increased to 75 mg 3 times daily -Continue monitor blood pressures per protocol and continue with IV hydralazine 10 mg IV every 6 as needed prn as well -Last blood pressure reading was 165/78   Left knee OA and effusion: -Concern for loose bodies in the joint by XR and CT. - Orthopedics consulted, seem to be planning arthrocentesis but could not be done and instead received a steroid injection.  - Multimodal pain control. -PT OT recommending SNF and TOC assisting with placement and awaiting bed placement    AKI on stage IIIb CKD:  -Cr up with diuresis but trending back down   - Stopping lasix, consider further work up if not improving.  -  Avoid nephrotoxins contrast dyes, hypotension, and dehydration to ensure adequate renal perfusion and renally adjust medications -Was going to restart dapagliflozin at lower dose, but will hold this for now with AKI.  -We will hold resuming her Lasix until renal function is back to her baseline -BUN/Cr is improving and went from  37/2.22 -> 42/1.86 -> 47/1.59 -> 49/1.54 -> 53/1.46 -Continue to Monitor Renal Fxn Carefully and repeat CMP in the AM   Pneumonia  -Felt to be ruled out as pt is afebrile without cough or leukocytosis on arrival. PCT undetectable. We will monitor cultures off antibiotics   Well-controlled NIDT2DM -HbA1c 6.1%.  -SSI with sensitive NovoLog sign scale insulin AC -CBGs ranging from 101-136   HLD:  -Continue Pravastatin 20 mg po Daily    History of provoked PE (covid-19 infection):  -Had completed anticoagulation. Did not have bleeding complications. Age-adjusted D-dimer is negative here.  -Resumed AC due to A Fib   Hypoalbuminemia -Patient's Albumin Level went from 2.9 -> 3.2 -Continue to Monitor and Trend and Repeat CMP in the AM   Insomnia:  -Reordered home Trazodone 100 mg po qHS -Still not sleeping very well given her extreme anxiousness   Morbid Obesity -Complicates overall prognosis and care -Estimated body mass index is 47.82 kg/m as calculated from the following:   Height as of this encounter: '5\' 2"'$  (1.575 m).   Weight as of this encounter: 118.6 kg.  -Weight Loss and Dietary Counseling given  DVT prophylaxis: SCDs Start: 12/28/21 2303 apixaban (ELIQUIS) tablet 5 mg    Code Status: DNR Family Communication: Discussed with husband at bedside  Disposition Plan:  Level of care: Progressive Status is: Inpatient Remains inpatient appropriate because: Needs SNF and insurance authorization and awaiting bed availability   Consultants:  Cardiology Orthopedic surgery  Procedures:  Echo 12/29/21: 1. Left ventricular ejection fraction, by estimation, is 45 to 50%. The  left ventricle has mildly decreased function. The left ventricle  demonstrates global hypokinesis. There is mild left ventricular  hypertrophy. Left ventricular diastolic parameters  are consistent with Grade II diastolic dysfunction (pseudonormalization).   2. Right ventricular systolic function is  normal. The right ventricular  size is normal.   3. The mitral valve is normal in structure. No evidence of mitral valve  regurgitation. No evidence of mitral stenosis. Moderate mitral annular  calcification.   4. The aortic valve is normal in structure. Aortic valve regurgitation is  not visualized. No aortic stenosis is present.   5. The inferior vena cava is normal in size with greater than 50%  respiratory variability, suggesting right atrial pressure of 3 mmHg.    Attempted Arthrocentesis and Cortisone Injection   Right Leg Venous Duplex  Summary:  RIGHT:  - No evidence of common femoral vein obstruction.  - No cystic structure found in the popliteal fossa.     LEFT:  - No evidence of common femoral vein obstruction.   Antimicrobials:  Anti-infectives (From admission, onward)    Start     Dose/Rate Route Frequency Ordered Stop   12/28/21 1415  cefTRIAXone (ROCEPHIN) 1 g in sodium chloride 0.9 % 100 mL IVPB        1 g 200 mL/hr over 30 Minutes Intravenous  Once 12/28/21 1409 12/28/21 1449   12/28/21 1415  azithromycin (ZITHROMAX) 500 mg in sodium chloride 0.9 % 250 mL IVPB        500 mg 250 mL/hr over 60 Minutes Intravenous  Once 12/28/21 1409 12/28/21 1518  Subjective: Seen and examined at bedside and Felt well and better. No nausea or vomiting. States her pain is getting better. States she slept fairly well last night. No other concerns or complaints at this time.   Objective: Vitals:   01/02/22 1310 01/02/22 2010 01/03/22 0350 01/03/22 0407  BP: (!) 176/68 (!) 153/83  (!) 165/78  Pulse: 85 65  60  Resp: '18 20  20  '$ Temp: 97.8 F (36.6 C) 97.7 F (36.5 C)  98 F (36.7 C)  TempSrc: Oral Oral  Oral  SpO2: 92% 94%  96%  Weight:   118.6 kg   Height:        Intake/Output Summary (Last 24 hours) at 01/03/2022 1056 Last data filed at 01/03/2022 0409 Gross per 24 hour  Intake 480 ml  Output 1150 ml  Net -670 ml   Filed Weights   01/01/22 0440 01/02/22 0500  01/03/22 0350  Weight: 119.3 kg 119.2 kg 118.6 kg   Examination: Physical Exam:  Constitutional: WN/WD morbidly obese Caucasian female in NAD  Respiratory: Diminished to auscultation bilaterally with coarse berath sounds, no wheezing, rales, rhonchi or crackles. Normal respiratory effort and patient is not tachypenic. No accessory muscle use. Unlabored breathing but wearing supplemental O2 via [place] Cardiovascular: RRR, no murmurs / rubs / gallops. S1 and S2 auscultated. Mild LE Edema Abdomen: Soft, non-tender, Distended 2/2 body habitus. Bowel sounds positive.  GU: Deferred. Musculoskeletal: No clubbing / cyanosis of digits/nails. No joint deformity upper and lower extremities.  Skin: No rashes, lesions, ulcers on a limited skin evaluation. No induration; Warm and dry.  Neurologic: CN 2-12 grossly intact with no focal deficits. Romberg sign cerebellar reflexes not assessed.  Psychiatric: Normal judgment and insight. Alert and oriented x 3. Not as anxious today.   Data Reviewed: I have personally reviewed following labs and imaging studies  CBC: Recent Labs  Lab 12/28/21 1151 12/29/21 0347 12/30/21 0359 01/01/22 0403 01/02/22 0416 01/03/22 0559  WBC 10.0 12.3* 10.9* 10.2 9.1 8.6  NEUTROABS 7.9*  --   --  8.7* 7.0 6.6  HGB 14.3 16.3* 14.4 13.9 14.0 14.6  HCT 44.3 50.3* 44.8 43.1 44.3 45.5  MCV 96.5 98.1 100.4* 98.6 100.7* 99.3  PLT 172 177 161 187 214 734   Basic Metabolic Panel: Recent Labs  Lab 12/29/21 0347 12/30/21 0359 12/31/21 0354 01/01/22 0403 01/02/22 0416 01/03/22 0559  NA 142 138 139 139 140 139  K 3.4* 4.3 4.3 4.6 4.5 4.5  CL 104 102 102 103 104 101  CO2 '27 27 29 29 29 31  '$ GLUCOSE 119* 89 120* 125* 101* 106*  BUN 25* 37* 42* 47* 49* 53*  CREATININE 1.34* 2.22* 1.86* 1.59* 1.54* 1.46*  CALCIUM 8.9 8.3* 8.4* 8.6* 8.6* 8.6*  MG 1.9 2.2  --  2.8* 3.1* 2.9*  PHOS 2.9  --   --  2.6 3.5 3.4   GFR: Estimated Creatinine Clearance: 34.5 mL/min (A) (by C-G formula  based on SCr of 1.46 mg/dL (H)). Liver Function Tests: Recent Labs  Lab 12/28/21 1151 12/29/21 0347 01/01/22 0403 01/02/22 0416 01/03/22 0559  AST 12* '18 18 18 18  '$ ALT '11 15 17 17 18  '$ ALKPHOS 52 55 48 46 44  BILITOT 0.9 1.2 0.7 0.8 0.8  PROT 7.2 7.6 6.6 6.4* 6.7  ALBUMIN 4.0 3.9 3.1* 2.9* 3.2*   No results for input(s): "LIPASE", "AMYLASE" in the last 168 hours. No results for input(s): "AMMONIA" in the last 168 hours. Coagulation Profile: No results  for input(s): "INR", "PROTIME" in the last 168 hours. Cardiac Enzymes: No results for input(s): "CKTOTAL", "CKMB", "CKMBINDEX", "TROPONINI" in the last 168 hours. BNP (last 3 results) No results for input(s): "PROBNP" in the last 8760 hours. HbA1C: No results for input(s): "HGBA1C" in the last 72 hours. CBG: Recent Labs  Lab 01/02/22 0749 01/02/22 1205 01/02/22 1639 01/02/22 2114 01/03/22 0744  GLUCAP 101* 118* 136* 113* 117*   Lipid Profile: No results for input(s): "CHOL", "HDL", "LDLCALC", "TRIG", "CHOLHDL", "LDLDIRECT" in the last 72 hours. Thyroid Function Tests: No results for input(s): "TSH", "T4TOTAL", "FREET4", "T3FREE", "THYROIDAB" in the last 72 hours. Anemia Panel: No results for input(s): "VITAMINB12", "FOLATE", "FERRITIN", "TIBC", "IRON", "RETICCTPCT" in the last 72 hours. Sepsis Labs: Recent Labs  Lab 12/28/21 1151 12/28/21 1921 12/29/21 0116  PROCALCITON  --   --  <0.10  LATICACIDVEN 1.0 1.1  --     Recent Results (from the past 240 hour(s))  Blood culture (routine x 2)     Status: None   Collection Time: 12/28/21 11:50 AM   Specimen: BLOOD RIGHT FOREARM  Result Value Ref Range Status   Specimen Description   Final    BLOOD RIGHT FOREARM Performed at [facility], [address], [place], [place] [address]    Special Requests   Final    BOTTLES DRAWN AEROBIC AND ANAEROBIC Blood Culture adequate volume Performed at [facility], [address],  [place], [place] [address]    Culture   Final    NO GROWTH 5 DAYS Performed at [facility], [place] [address], [place], [place] [address]    Report Status 01/02/2022 FINAL  Final  SARS Coronavirus 2 by RT PCR (hospital order, performed in [facility] hospital lab) *cepheid single result test* Anterior Nasal Swab     Status: None   Collection Time: 12/28/21 11:51 AM   Specimen: Anterior Nasal Swab  Result Value Ref Range Status   SARS Coronavirus 2 by RT PCR NEGATIVE NEGATIVE Final    Comment: (NOTE) SARS-CoV-2 target nucleic acids are NOT DETECTED.  The SARS-CoV-2 RNA is generally detectable in upper and lower respiratory specimens during the acute phase of infection. The lowest concentration of SARS-CoV-2 viral copies this assay can detect is 250 copies / mL. A negative result does not preclude SARS-CoV-2 infection and should not be used as the sole basis for treatment or other patient management decisions.  A negative result may occur with improper specimen collection / handling, submission of specimen other than nasopharyngeal swab, presence of viral mutation(s) within the areas targeted by this assay, and inadequate number of viral copies (<250 copies / mL). A negative result must be combined with clinical observations, patient history, and epidemiological information.  Fact Sheet for Patients:   https://www.patel.info/  Fact Sheet for Healthcare Providers: https://hall.com/  This test is not yet approved or  cleared by the [place] FDA and has been authorized for detection and/or diagnosis of SARS-CoV-2 by FDA under an Emergency Use Authorization (EUA).  This EUA will remain in effect (meaning this test can be used) for the duration of the COVID-19 declaration under Section 564(b)(1) of the Act, 21 U.S.C. section 360bbb-3(b)(1), unless the authorization is terminated or revoked sooner.  Performed at [facility], [address] [place],  [address]   Blood culture (routine x 2)     Status: None   Collection Time: 12/28/21  7:22 PM   Specimen: BLOOD  Result Value Ref Range  Status   Specimen Description   Final    BLOOD BLOOD LEFT HAND Performed at [facility] [address], [place], [place] [address]    Special Requests   Final    IN PEDIATRIC BOTTLE Blood Culture adequate volume Performed at [facility] [address] [place], [place] [address]    Culture   Final    NO GROWTH 5 DAYS Performed at [facility], [place] [address], [place], [place] [address]    Report Status 01/02/2022 FINAL  Final     Radiology Studies: No results found.  Scheduled Meds:  apixaban  5 mg Oral BID   diclofenac Sodium  2 g Topical QID   hydrALAZINE  75 mg Oral Q8H   insulin aspart  0-9 Units Subcutaneous TID WC   metoprolol succinate  50 mg Oral QPM   pravastatin  20 mg Oral q1800   traZODone  100 mg Oral QHS   Continuous Infusions:   LOS: 6 days   Raiford Noble, DO [facility] Available via Epic secure chat 7am-7pm After these hours, please refer to coverage provider listed on [email or web address] 01/03/2022, 10:56 AM

## 2022-01-03 NOTE — TOC Progression Note (Signed)
Transition of Care [facility]) - Progression Note    Patient Details  Name: Pamela [name] MRN: [id number] Date of Birth: 04/24/1935  Transition of Care [facility]) CM/SW Contact  Leeroy Cha, RN Phone Number: 01/03/2022, 3:19 PM  Clinical Narrative:    Text to Marlin with [facility]. Will take patient. Pt and ot ordered.   Expected Discharge Plan: [place] Barriers to Discharge: Continued Medical Work up  Expected Discharge Plan and Services Expected Discharge Plan: [place]   Discharge Planning Services: CM Consult   Living arrangements for the past 2 months: Single Family Home                                       Social Determinants of Health (SDOH) Interventions    Readmission Risk Interventions    05/06/2021    4:05 PM  Readmission Risk Prevention Plan  Transportation Screening Complete  PCP or Specialist Appt within 5-7 Days Complete  Home Care Screening Complete  Medication Review (RN CM) Complete

## 2022-01-03 NOTE — Progress Notes (Addendum)
Physical Therapy Treatment Patient Details Name: Pamela [name] MRN: [id number] DOB: 09-May-1935 Today's Date: 01/03/2022   History of Present Illness patient is a 87 year old female who presented from home after a fall with left knee pain.patient was admitted with acute on chronic systolic CHF, hypertensive emergency, advanced and progressive tricompartmental arthritis of L knee ad elevated D-dimer. PMH: hypertension, T2DM, HFrEF, CKD stage IIIb, hyperlipidemia and pulmonary embolism    PT Comments    Pt has progressed her mobility and now is able to D/C to home vs SNF.  Will update LPT, new rec for [facility] PT.  Spouse and pt agree to new plan. Secure chat TOC.  Pt self able to rise from bed/recliner and tolerated amb a functional distance.     Recommendations for follow up therapy are one component of a multi-disciplinary discharge planning process, led by the attending physician.  Recommendations may be updated based on patient status, additional functional criteria and insurance authorization.  Follow Up Recommendations  Home health PT Can patient physically be transported by private vehicle: Yes   Assistance Recommended at Discharge Frequent or constant Supervision/Assistance  Patient can return home with the following Assist for transportation;Help with stairs or ramp for entrance;Assistance with cooking/housework;A lot of help with walking and/or transfers;A lot of help with bathing/dressing/bathroom   Equipment Recommendations  None recommended by PT    Recommendations for Other Services       Precautions / Restrictions Precautions Precautions: Fall Precaution Comments: "bad" L knee Arthritis (crepitus) Restrictions Weight Bearing Restrictions: No     Mobility  Bed Mobility Overal bed mobility: Needs Assistance Bed Mobility: Supine to Sit     Supine to sit: Min assist     General bed mobility comments: Assist for trunk and bil LEs. Increased time with increased ability  to self perform.    Transfers Overall transfer level: Needs assistance Equipment used: Rolling walker (2 wheels)               General transfer comment: increased self ability to rise using walker and forward momentum.  Pt self able to rise from recliner pushing up from arm rests.    Ambulation/Gait Ambulation/Gait assistance: Min guard, Supervision Gait Distance (Feet): 45 Feet Assistive device: Rolling walker (2 wheels) Gait Pattern/deviations: Step-to pattern, Decreased stance time - left, Trunk flexed Gait velocity: decreased     General Gait Details: tolerated an increased distance of 30 feet then another 15 feet. with Spouse following with recliner as precaution.  "this is as much as she walks at home", stated Spouse.  Pt uses a wheelchair outside of home.  Trial RA sats > 90% and avg HR 77.   Stairs             Wheelchair Mobility    Modified Rankin (Stroke Patients Only)       Balance                                            Cognition Arousal/Alertness: Awake/alert Behavior During Therapy: WFL for tasks assessed/performed Overall Cognitive Status: Within Functional Limits for tasks assessed                                 General Comments: AxO x 3 very pleasant Lady and very willing to try.  Progressing  enough to return to home with Spouse.        Exercises      General Comments        Pertinent Vitals/Pain Pain Assessment Pain Assessment: Faces Faces Pain Scale: Hurts a little bit Pain Location: B knees "better" Pain Descriptors / Indicators: Discomfort, Sore, Aching, Grimacing Pain Intervention(s): Monitored during session    Home Living                          Prior Function            PT Goals (current goals can now be found in the care plan section) Progress towards PT goals: Progressing toward goals    Frequency    Min 2X/week      PT Plan Discharge plan needs to be  updated    Co-evaluation              AM-PAC PT "6 Clicks" Mobility   Outcome Measure  Help needed turning from your back to your side while in a flat bed without using bedrails?: A Little Help needed moving from lying on your back to sitting on the side of a flat bed without using bedrails?: A Little Help needed moving to and from a bed to a chair (including a wheelchair)?: A Little Help needed standing up from a chair using your arms (e.g., wheelchair or bedside chair)?: A Little Help needed to walk in hospital room?: A Little Help needed climbing 3-5 steps with a railing? : A Lot 6 Click Score: 17    End of Session Equipment Utilized During Treatment: Gait belt Activity Tolerance: Patient tolerated treatment well Patient left: in chair;with call bell/phone within reach;with family/visitor present;with chair alarm set Nurse Communication: Mobility status PT Visit Diagnosis: Pain;Difficulty in walking, not elsewhere classified (R26.2);Muscle weakness (generalized) (M62.81);History of falling (Z91.81) Pain - Right/Left: Left Pain - part of body: Knee     Time: 3009-2330 PT Time Calculation (min) (ACUTE ONLY): 26 min  Charges:  $Gait Training: 8-22 mins $Therapeutic Activity: 8-22 mins                     Rica Koyanagi  PTA Acute  Rehabilitation Services Office M-F          [phone] Weekend pager [phone]

## 2022-01-03 NOTE — Plan of Care (Signed)
  Problem: Clinical Measurements: Goal: Respiratory complications will improve Outcome: Adequate for Discharge   Problem: Activity: Goal: Risk for activity intolerance will decrease Outcome: Adequate for Discharge   Problem: Nutrition: Goal: Adequate nutrition will be maintained Outcome: Adequate for Discharge   Problem: Elimination: Goal: Will not experience complications related to bowel motility Outcome: Adequate for Discharge   Problem: Pain Managment: Goal: General experience of comfort will improve Outcome: Adequate for Discharge   Problem: Safety: Goal: Ability to remain free from injury will improve Outcome: Adequate for Discharge

## 2022-01-03 NOTE — TOC Transition Note (Signed)
Transition of Care [facility]) - CM/SW Discharge Note   Patient Details  Name: Pamela [name] MRN: [id number] Date of Birth: 1935/05/20  Transition of Care [facility]) CM/SW Contact:  Leeroy Cha, RN Phone Number: 01/03/2022, 3:22 PM   Clinical Narrative:    Dcd to home with pt and ot through [place].   Final next level of care: [place] Barriers to Discharge: Barriers Resolved   Patient Goals and CMS Choice Patient states their goals for this hospitalization and ongoing recovery are:: to go home CMS Medicare.gov Compare Post Acute Care list provided to:: Patient    Discharge Placement                       Discharge Plan and Services   Discharge Planning Services: CM Consult                      HH Arranged: PT, OT [facility] Agency: [place] Date [place]: 01/03/22 Time [place]: 6294 Representative spoke with at [place]: [place] ([place]) Interventions     Readmission Risk Interventions    05/06/2021    4:05 PM  Readmission Risk Prevention Plan  Transportation Screening Complete  PCP or Specialist Appt within 5-7 Days Complete  Home Care Screening Complete  Medication Review (RN CM) Complete

## 2022-01-07 ENCOUNTER — Telehealth: Payer: Self-pay | Admitting: Pulmonary Disease

## 2022-01-07 NOTE — Telephone Encounter (Signed)
Patient's husband, Thayer Jew, called and states patient was discharged from the hospital on Monday, but they did not send her home with oxygen. Thayer Jew states that patients stats are in the high 80's or low 90's. Patient is very shaky even with her walker and is unsure if she should be on oxygen. Please call back at [phone].

## 2022-01-07 NOTE — Telephone Encounter (Signed)
Called and spoke with pt's spouse who stated that pt's O2 sats have been ranging from the high 80s to the 90s. Looked at hospital stay and saw that pt was able to be discharged home without needing any O2. Stated to Thayer Jew that we would need to reassess pt to see if she actually was requiring O2 as we could not just send an order as insurance would not cover it. Thayer Jew stated that pt had an upcoming appt scheduled with PCP but also wanted to make an appt with MH only. Scheduled appt for pt with MH at his next avail. Nothing further needed.

## 2022-01-08 DIAGNOSIS — K219 Gastro-esophageal reflux disease without esophagitis: Secondary | ICD-10-CM | POA: Diagnosis not present

## 2022-01-08 DIAGNOSIS — M25462 Effusion, left knee: Secondary | ICD-10-CM | POA: Diagnosis not present

## 2022-01-08 DIAGNOSIS — Z87891 Personal history of nicotine dependence: Secondary | ICD-10-CM | POA: Diagnosis not present

## 2022-01-08 DIAGNOSIS — R569 Unspecified convulsions: Secondary | ICD-10-CM | POA: Diagnosis not present

## 2022-01-08 DIAGNOSIS — M1712 Unilateral primary osteoarthritis, left knee: Secondary | ICD-10-CM | POA: Diagnosis not present

## 2022-01-08 DIAGNOSIS — Z9181 History of falling: Secondary | ICD-10-CM | POA: Diagnosis not present

## 2022-01-08 DIAGNOSIS — Z7901 Long term (current) use of anticoagulants: Secondary | ICD-10-CM | POA: Diagnosis not present

## 2022-01-08 DIAGNOSIS — G4700 Insomnia, unspecified: Secondary | ICD-10-CM | POA: Diagnosis not present

## 2022-01-08 DIAGNOSIS — I480 Paroxysmal atrial fibrillation: Secondary | ICD-10-CM | POA: Diagnosis not present

## 2022-01-08 DIAGNOSIS — M8580 Other specified disorders of bone density and structure, unspecified site: Secondary | ICD-10-CM | POA: Diagnosis not present

## 2022-01-08 DIAGNOSIS — E782 Mixed hyperlipidemia: Secondary | ICD-10-CM | POA: Diagnosis not present

## 2022-01-08 DIAGNOSIS — Z6841 Body Mass Index (BMI) 40.0 and over, adult: Secondary | ICD-10-CM | POA: Diagnosis not present

## 2022-01-08 DIAGNOSIS — K5710 Diverticulosis of small intestine without perforation or abscess without bleeding: Secondary | ICD-10-CM | POA: Diagnosis not present

## 2022-01-08 DIAGNOSIS — Z8616 Personal history of COVID-19: Secondary | ICD-10-CM | POA: Diagnosis not present

## 2022-01-08 DIAGNOSIS — N1832 Chronic kidney disease, stage 3b: Secondary | ICD-10-CM | POA: Diagnosis not present

## 2022-01-08 DIAGNOSIS — J9601 Acute respiratory failure with hypoxia: Secondary | ICD-10-CM | POA: Diagnosis not present

## 2022-01-08 DIAGNOSIS — I10 Essential (primary) hypertension: Secondary | ICD-10-CM | POA: Diagnosis not present

## 2022-01-08 DIAGNOSIS — E1122 Type 2 diabetes mellitus with diabetic chronic kidney disease: Secondary | ICD-10-CM | POA: Diagnosis not present

## 2022-01-08 DIAGNOSIS — I130 Hypertensive heart and chronic kidney disease with heart failure and stage 1 through stage 4 chronic kidney disease, or unspecified chronic kidney disease: Secondary | ICD-10-CM | POA: Diagnosis not present

## 2022-01-08 DIAGNOSIS — J189 Pneumonia, unspecified organism: Secondary | ICD-10-CM | POA: Diagnosis not present

## 2022-01-08 DIAGNOSIS — Z86010 Personal history of colonic polyps: Secondary | ICD-10-CM | POA: Diagnosis not present

## 2022-01-08 DIAGNOSIS — Z86711 Personal history of pulmonary embolism: Secondary | ICD-10-CM | POA: Diagnosis not present

## 2022-01-08 DIAGNOSIS — I5023 Acute on chronic systolic (congestive) heart failure: Secondary | ICD-10-CM | POA: Diagnosis not present

## 2022-01-08 DIAGNOSIS — Z7984 Long term (current) use of oral hypoglycemic drugs: Secondary | ICD-10-CM | POA: Diagnosis not present

## 2022-01-12 DIAGNOSIS — I5023 Acute on chronic systolic (congestive) heart failure: Secondary | ICD-10-CM | POA: Diagnosis not present

## 2022-01-12 DIAGNOSIS — Z86010 Personal history of colonic polyps: Secondary | ICD-10-CM | POA: Diagnosis not present

## 2022-01-12 DIAGNOSIS — Z8616 Personal history of COVID-19: Secondary | ICD-10-CM | POA: Diagnosis not present

## 2022-01-12 DIAGNOSIS — Z87891 Personal history of nicotine dependence: Secondary | ICD-10-CM | POA: Diagnosis not present

## 2022-01-12 DIAGNOSIS — R569 Unspecified convulsions: Secondary | ICD-10-CM | POA: Diagnosis not present

## 2022-01-12 DIAGNOSIS — Z6841 Body Mass Index (BMI) 40.0 and over, adult: Secondary | ICD-10-CM | POA: Diagnosis not present

## 2022-01-12 DIAGNOSIS — Z7984 Long term (current) use of oral hypoglycemic drugs: Secondary | ICD-10-CM | POA: Diagnosis not present

## 2022-01-12 DIAGNOSIS — N1832 Chronic kidney disease, stage 3b: Secondary | ICD-10-CM | POA: Diagnosis not present

## 2022-01-12 DIAGNOSIS — G4700 Insomnia, unspecified: Secondary | ICD-10-CM | POA: Diagnosis not present

## 2022-01-12 DIAGNOSIS — J189 Pneumonia, unspecified organism: Secondary | ICD-10-CM | POA: Diagnosis not present

## 2022-01-12 DIAGNOSIS — M25462 Effusion, left knee: Secondary | ICD-10-CM | POA: Diagnosis not present

## 2022-01-12 DIAGNOSIS — Z9181 History of falling: Secondary | ICD-10-CM | POA: Diagnosis not present

## 2022-01-12 DIAGNOSIS — J9601 Acute respiratory failure with hypoxia: Secondary | ICD-10-CM | POA: Diagnosis not present

## 2022-01-12 DIAGNOSIS — Z7901 Long term (current) use of anticoagulants: Secondary | ICD-10-CM | POA: Diagnosis not present

## 2022-01-12 DIAGNOSIS — K5710 Diverticulosis of small intestine without perforation or abscess without bleeding: Secondary | ICD-10-CM | POA: Diagnosis not present

## 2022-01-12 DIAGNOSIS — M8580 Other specified disorders of bone density and structure, unspecified site: Secondary | ICD-10-CM | POA: Diagnosis not present

## 2022-01-12 DIAGNOSIS — E782 Mixed hyperlipidemia: Secondary | ICD-10-CM | POA: Diagnosis not present

## 2022-01-12 DIAGNOSIS — M1712 Unilateral primary osteoarthritis, left knee: Secondary | ICD-10-CM | POA: Diagnosis not present

## 2022-01-12 DIAGNOSIS — I480 Paroxysmal atrial fibrillation: Secondary | ICD-10-CM | POA: Diagnosis not present

## 2022-01-12 DIAGNOSIS — E1122 Type 2 diabetes mellitus with diabetic chronic kidney disease: Secondary | ICD-10-CM | POA: Diagnosis not present

## 2022-01-12 DIAGNOSIS — I130 Hypertensive heart and chronic kidney disease with heart failure and stage 1 through stage 4 chronic kidney disease, or unspecified chronic kidney disease: Secondary | ICD-10-CM | POA: Diagnosis not present

## 2022-01-12 DIAGNOSIS — K219 Gastro-esophageal reflux disease without esophagitis: Secondary | ICD-10-CM | POA: Diagnosis not present

## 2022-01-12 DIAGNOSIS — Z86711 Personal history of pulmonary embolism: Secondary | ICD-10-CM | POA: Diagnosis not present

## 2022-01-13 DIAGNOSIS — I509 Heart failure, unspecified: Secondary | ICD-10-CM | POA: Diagnosis not present

## 2022-01-13 DIAGNOSIS — I129 Hypertensive chronic kidney disease with stage 1 through stage 4 chronic kidney disease, or unspecified chronic kidney disease: Secondary | ICD-10-CM | POA: Diagnosis not present

## 2022-01-13 DIAGNOSIS — R69 Illness, unspecified: Secondary | ICD-10-CM | POA: Diagnosis not present

## 2022-01-13 DIAGNOSIS — I480 Paroxysmal atrial fibrillation: Secondary | ICD-10-CM | POA: Diagnosis not present

## 2022-01-13 DIAGNOSIS — D6869 Other thrombophilia: Secondary | ICD-10-CM | POA: Diagnosis not present

## 2022-01-13 DIAGNOSIS — E1122 Type 2 diabetes mellitus with diabetic chronic kidney disease: Secondary | ICD-10-CM | POA: Diagnosis not present

## 2022-01-13 DIAGNOSIS — N1832 Chronic kidney disease, stage 3b: Secondary | ICD-10-CM | POA: Diagnosis not present

## 2022-01-14 DIAGNOSIS — Z86711 Personal history of pulmonary embolism: Secondary | ICD-10-CM | POA: Diagnosis not present

## 2022-01-14 DIAGNOSIS — Z7984 Long term (current) use of oral hypoglycemic drugs: Secondary | ICD-10-CM | POA: Diagnosis not present

## 2022-01-14 DIAGNOSIS — J189 Pneumonia, unspecified organism: Secondary | ICD-10-CM | POA: Diagnosis not present

## 2022-01-14 DIAGNOSIS — M8580 Other specified disorders of bone density and structure, unspecified site: Secondary | ICD-10-CM | POA: Diagnosis not present

## 2022-01-14 DIAGNOSIS — Z86010 Personal history of colonic polyps: Secondary | ICD-10-CM | POA: Diagnosis not present

## 2022-01-14 DIAGNOSIS — Z6841 Body Mass Index (BMI) 40.0 and over, adult: Secondary | ICD-10-CM | POA: Diagnosis not present

## 2022-01-14 DIAGNOSIS — Z9181 History of falling: Secondary | ICD-10-CM | POA: Diagnosis not present

## 2022-01-14 DIAGNOSIS — R569 Unspecified convulsions: Secondary | ICD-10-CM | POA: Diagnosis not present

## 2022-01-14 DIAGNOSIS — J9601 Acute respiratory failure with hypoxia: Secondary | ICD-10-CM | POA: Diagnosis not present

## 2022-01-14 DIAGNOSIS — K219 Gastro-esophageal reflux disease without esophagitis: Secondary | ICD-10-CM | POA: Diagnosis not present

## 2022-01-14 DIAGNOSIS — I480 Paroxysmal atrial fibrillation: Secondary | ICD-10-CM | POA: Diagnosis not present

## 2022-01-14 DIAGNOSIS — G4700 Insomnia, unspecified: Secondary | ICD-10-CM | POA: Diagnosis not present

## 2022-01-14 DIAGNOSIS — E782 Mixed hyperlipidemia: Secondary | ICD-10-CM | POA: Diagnosis not present

## 2022-01-14 DIAGNOSIS — Z87891 Personal history of nicotine dependence: Secondary | ICD-10-CM | POA: Diagnosis not present

## 2022-01-14 DIAGNOSIS — Z8616 Personal history of COVID-19: Secondary | ICD-10-CM | POA: Diagnosis not present

## 2022-01-14 DIAGNOSIS — M25462 Effusion, left knee: Secondary | ICD-10-CM | POA: Diagnosis not present

## 2022-01-14 DIAGNOSIS — M1712 Unilateral primary osteoarthritis, left knee: Secondary | ICD-10-CM | POA: Diagnosis not present

## 2022-01-14 DIAGNOSIS — K5710 Diverticulosis of small intestine without perforation or abscess without bleeding: Secondary | ICD-10-CM | POA: Diagnosis not present

## 2022-01-14 DIAGNOSIS — E1122 Type 2 diabetes mellitus with diabetic chronic kidney disease: Secondary | ICD-10-CM | POA: Diagnosis not present

## 2022-01-14 DIAGNOSIS — N1832 Chronic kidney disease, stage 3b: Secondary | ICD-10-CM | POA: Diagnosis not present

## 2022-01-14 DIAGNOSIS — Z7901 Long term (current) use of anticoagulants: Secondary | ICD-10-CM | POA: Diagnosis not present

## 2022-01-14 DIAGNOSIS — I130 Hypertensive heart and chronic kidney disease with heart failure and stage 1 through stage 4 chronic kidney disease, or unspecified chronic kidney disease: Secondary | ICD-10-CM | POA: Diagnosis not present

## 2022-01-14 DIAGNOSIS — I5023 Acute on chronic systolic (congestive) heart failure: Secondary | ICD-10-CM | POA: Diagnosis not present

## 2022-01-18 DIAGNOSIS — R569 Unspecified convulsions: Secondary | ICD-10-CM | POA: Diagnosis not present

## 2022-01-18 DIAGNOSIS — J189 Pneumonia, unspecified organism: Secondary | ICD-10-CM | POA: Diagnosis not present

## 2022-01-18 DIAGNOSIS — M8580 Other specified disorders of bone density and structure, unspecified site: Secondary | ICD-10-CM | POA: Diagnosis not present

## 2022-01-18 DIAGNOSIS — Z86711 Personal history of pulmonary embolism: Secondary | ICD-10-CM | POA: Diagnosis not present

## 2022-01-18 DIAGNOSIS — Z6841 Body Mass Index (BMI) 40.0 and over, adult: Secondary | ICD-10-CM | POA: Diagnosis not present

## 2022-01-18 DIAGNOSIS — M1712 Unilateral primary osteoarthritis, left knee: Secondary | ICD-10-CM | POA: Diagnosis not present

## 2022-01-18 DIAGNOSIS — Z9181 History of falling: Secondary | ICD-10-CM | POA: Diagnosis not present

## 2022-01-18 DIAGNOSIS — E782 Mixed hyperlipidemia: Secondary | ICD-10-CM | POA: Diagnosis not present

## 2022-01-18 DIAGNOSIS — Z87891 Personal history of nicotine dependence: Secondary | ICD-10-CM | POA: Diagnosis not present

## 2022-01-18 DIAGNOSIS — I5023 Acute on chronic systolic (congestive) heart failure: Secondary | ICD-10-CM | POA: Diagnosis not present

## 2022-01-18 DIAGNOSIS — I480 Paroxysmal atrial fibrillation: Secondary | ICD-10-CM | POA: Diagnosis not present

## 2022-01-18 DIAGNOSIS — M25462 Effusion, left knee: Secondary | ICD-10-CM | POA: Diagnosis not present

## 2022-01-18 DIAGNOSIS — E1122 Type 2 diabetes mellitus with diabetic chronic kidney disease: Secondary | ICD-10-CM | POA: Diagnosis not present

## 2022-01-18 DIAGNOSIS — Z86010 Personal history of colonic polyps: Secondary | ICD-10-CM | POA: Diagnosis not present

## 2022-01-18 DIAGNOSIS — G4700 Insomnia, unspecified: Secondary | ICD-10-CM | POA: Diagnosis not present

## 2022-01-18 DIAGNOSIS — I130 Hypertensive heart and chronic kidney disease with heart failure and stage 1 through stage 4 chronic kidney disease, or unspecified chronic kidney disease: Secondary | ICD-10-CM | POA: Diagnosis not present

## 2022-01-18 DIAGNOSIS — Z7901 Long term (current) use of anticoagulants: Secondary | ICD-10-CM | POA: Diagnosis not present

## 2022-01-18 DIAGNOSIS — K5710 Diverticulosis of small intestine without perforation or abscess without bleeding: Secondary | ICD-10-CM | POA: Diagnosis not present

## 2022-01-18 DIAGNOSIS — Z8616 Personal history of COVID-19: Secondary | ICD-10-CM | POA: Diagnosis not present

## 2022-01-18 DIAGNOSIS — Z7984 Long term (current) use of oral hypoglycemic drugs: Secondary | ICD-10-CM | POA: Diagnosis not present

## 2022-01-18 DIAGNOSIS — K219 Gastro-esophageal reflux disease without esophagitis: Secondary | ICD-10-CM | POA: Diagnosis not present

## 2022-01-18 DIAGNOSIS — N1832 Chronic kidney disease, stage 3b: Secondary | ICD-10-CM | POA: Diagnosis not present

## 2022-01-18 DIAGNOSIS — J9601 Acute respiratory failure with hypoxia: Secondary | ICD-10-CM | POA: Diagnosis not present

## 2022-01-26 ENCOUNTER — Ambulatory Visit: Payer: Medicare HMO | Admitting: Pulmonary Disease

## 2022-02-07 DIAGNOSIS — Z7984 Long term (current) use of oral hypoglycemic drugs: Secondary | ICD-10-CM | POA: Diagnosis not present

## 2022-02-07 DIAGNOSIS — J9601 Acute respiratory failure with hypoxia: Secondary | ICD-10-CM | POA: Diagnosis not present

## 2022-02-07 DIAGNOSIS — Z87891 Personal history of nicotine dependence: Secondary | ICD-10-CM | POA: Diagnosis not present

## 2022-02-07 DIAGNOSIS — G4700 Insomnia, unspecified: Secondary | ICD-10-CM | POA: Diagnosis not present

## 2022-02-07 DIAGNOSIS — E1122 Type 2 diabetes mellitus with diabetic chronic kidney disease: Secondary | ICD-10-CM | POA: Diagnosis not present

## 2022-02-07 DIAGNOSIS — I130 Hypertensive heart and chronic kidney disease with heart failure and stage 1 through stage 4 chronic kidney disease, or unspecified chronic kidney disease: Secondary | ICD-10-CM | POA: Diagnosis not present

## 2022-02-07 DIAGNOSIS — I5023 Acute on chronic systolic (congestive) heart failure: Secondary | ICD-10-CM | POA: Diagnosis not present

## 2022-02-07 DIAGNOSIS — M1712 Unilateral primary osteoarthritis, left knee: Secondary | ICD-10-CM | POA: Diagnosis not present

## 2022-02-07 DIAGNOSIS — M25462 Effusion, left knee: Secondary | ICD-10-CM | POA: Diagnosis not present

## 2022-02-07 DIAGNOSIS — Z86711 Personal history of pulmonary embolism: Secondary | ICD-10-CM | POA: Diagnosis not present

## 2022-02-07 DIAGNOSIS — N1832 Chronic kidney disease, stage 3b: Secondary | ICD-10-CM | POA: Diagnosis not present

## 2022-02-07 DIAGNOSIS — R569 Unspecified convulsions: Secondary | ICD-10-CM | POA: Diagnosis not present

## 2022-02-07 DIAGNOSIS — K5710 Diverticulosis of small intestine without perforation or abscess without bleeding: Secondary | ICD-10-CM | POA: Diagnosis not present

## 2022-02-07 DIAGNOSIS — Z8616 Personal history of COVID-19: Secondary | ICD-10-CM | POA: Diagnosis not present

## 2022-02-07 DIAGNOSIS — Z7901 Long term (current) use of anticoagulants: Secondary | ICD-10-CM | POA: Diagnosis not present

## 2022-02-07 DIAGNOSIS — M8580 Other specified disorders of bone density and structure, unspecified site: Secondary | ICD-10-CM | POA: Diagnosis not present

## 2022-02-07 DIAGNOSIS — K219 Gastro-esophageal reflux disease without esophagitis: Secondary | ICD-10-CM | POA: Diagnosis not present

## 2022-02-07 DIAGNOSIS — I480 Paroxysmal atrial fibrillation: Secondary | ICD-10-CM | POA: Diagnosis not present

## 2022-02-07 DIAGNOSIS — E782 Mixed hyperlipidemia: Secondary | ICD-10-CM | POA: Diagnosis not present

## 2022-02-07 DIAGNOSIS — Z9181 History of falling: Secondary | ICD-10-CM | POA: Diagnosis not present

## 2022-02-07 DIAGNOSIS — J189 Pneumonia, unspecified organism: Secondary | ICD-10-CM | POA: Diagnosis not present

## 2022-02-07 DIAGNOSIS — Z6841 Body Mass Index (BMI) 40.0 and over, adult: Secondary | ICD-10-CM | POA: Diagnosis not present

## 2022-02-07 DIAGNOSIS — Z86010 Personal history of colonic polyps: Secondary | ICD-10-CM | POA: Diagnosis not present

## 2022-02-16 ENCOUNTER — Ambulatory Visit: Payer: Medicare HMO | Admitting: Cardiovascular Disease

## 2022-02-16 ENCOUNTER — Encounter: Payer: Self-pay | Admitting: Cardiovascular Disease

## 2022-02-16 ENCOUNTER — Ambulatory Visit (HOSPITAL_COMMUNITY)
Admission: RE | Admit: 2022-02-16 | Discharge: 2022-02-16 | Disposition: A | Discharge status: Home / Self Care | Payer: Medicare HMO | Source: Ambulatory Visit | Attending: Cardiovascular Disease | Admitting: Cardiovascular Disease

## 2022-02-16 ENCOUNTER — Other Ambulatory Visit: Payer: Self-pay | Admitting: Cardiovascular Disease

## 2022-02-16 VITALS — BP 155/81 | HR 73 | Ht 62.0 in | Wt 259.6 lb

## 2022-02-16 DIAGNOSIS — I5031 Acute diastolic (congestive) heart failure: Secondary | ICD-10-CM | POA: Diagnosis not present

## 2022-02-16 DIAGNOSIS — I2699 Other pulmonary embolism without acute cor pulmonale: Secondary | ICD-10-CM | POA: Diagnosis not present

## 2022-02-16 DIAGNOSIS — E782 Mixed hyperlipidemia: Secondary | ICD-10-CM

## 2022-02-16 DIAGNOSIS — M7989 Other specified soft tissue disorders: Secondary | ICD-10-CM

## 2022-02-16 DIAGNOSIS — I10 Essential (primary) hypertension: Secondary | ICD-10-CM | POA: Diagnosis not present

## 2022-02-16 DIAGNOSIS — R600 Localized edema: Secondary | ICD-10-CM | POA: Diagnosis not present

## 2022-02-16 DIAGNOSIS — I480 Paroxysmal atrial fibrillation: Secondary | ICD-10-CM

## 2022-02-16 MED ORDER — APIXABAN 5 MG PO TABS: 5.0000 mg | ORAL_TABLET | Freq: Two times a day (BID) | ORAL | 3 refills | Status: DC

## 2022-02-16 NOTE — Assessment & Plan Note (Signed)
History of essential hypertension blood pressure measured today at 155/81.  She is on low-dose amlodipine, metoprolol and hydralazine.  Blood pressure at home which they take regularly was much lower than this.

## 2022-02-16 NOTE — Assessment & Plan Note (Signed)
History of diastolic heart failure with 2D echo performed 12/21/2021 revealing an EF of 45 to 50% with grade 2 diastolic dysfunction.  She is on oral diuretic.

## 2022-02-16 NOTE — Assessment & Plan Note (Signed)
History of PAF in October of last year and during recent admission 12/29/2021.  She did convert to sinus rhythm. This patients CHA2DS2-VASc Score and unadjusted Ischemic Stroke Rate (% per year) is equal to 9.7 % stroke rate/year from a score of 6  Above score calculated as 1 point each if present [CHF, HTN, DM, Vascular=MI/PAD/Aortic Plaque, Age if 65-74, or Female] Above score calculated as 2 points each if present [Age > 75, or Stroke/TIA/TE]  She was discharged home on Eliquis which she has yet to start.  I have encouraged her to start her Eliquis ASAP.  She currently is in sinus rhythm.

## 2022-02-16 NOTE — Progress Notes (Signed)
02/16/2022 Pamela [name]   1935/06/21  [id number]  Primary Physician Orpah Melter, MD Primary Cardiologist: Lorretta Harp MD [name], [place]  HPI:  Pamela [name] is a 87 y.o.  year-old severely overweight married Caucasian female mother of 2 children, grandmother and 2 grandchildren referred by Dr. Trellis Paganini at [facility] for cardiovascular evaluation of left greater than right lower extremity edema.  I last saw her in the office 09/10/2013.  Her cardiac risk factor profile is remarkable for treated diabetes and hypertension. Otherwise she is a remote smoker having stopped 25 years ago. There is no family history of heart disease. She's never had a heart attack or stroke. She denies chest pain but does get some dyspnea on exertion. She developed left greater than right lower extremity edema over the last 3-4 months along with some dyspnea. She also has a superimposed cellulitis on the left which is somewhat pruritic. Dr. Geroge Baseman treated her with an oral antibiotic and compression stockings. She does admit to some dietary indiscretion with regards to salt.  She was admitted 12/29/2021 after a fall with inability get up.  She was in A-fib at that time.  Echo revealed EF of 45 to 50% with grade 2 diastolic dysfunction.  She converted to sinus rhythm and was placed on Eliquis oral anticoagulation by Dr. Oval Linsey.  Unfortunately, she has not started this yet.  She has noticed increased swelling in her left calf over the last several days.  She is minimally ambulatory and walks with a walker in her house.  She denies chest pain or shortness of breath.     Current Meds  Medication Sig   acetaminophen (TYLENOL 8 HOUR ARTHRITIS PAIN) 650 MG CR tablet Take 2 tablets by mouth in the morning and at bedtime.   ALPRAZolam (XANAX) 0.25 MG tablet Take 0.125-0.25 mg by mouth 2 (two) times daily as needed.   amLODipine (NORVASC) 2.5 MG tablet Take 2.5 mg by mouth daily.   calcium  carbonate (OSCAL) 1500 (600 Ca) MG TABS tablet Take 1,500 mg by mouth 2 (two) times daily with a meal.   dapagliflozin propanediol (FARXIGA) 10 MG TABS tablet Take 10 mg by mouth daily.   diclofenac Sodium (VOLTAREN) 1 % GEL Apply 2 g topically 4 (four) times daily.   furosemide (LASIX) 20 MG tablet Take 1 tablet (20 mg total) by mouth daily as needed for edema (or weight gain).   K Phos Mono-Sod Phos Di & Mono (PHOSPHA 250 NEUTRAL) 155-852-130 MG TABS Take 2 tablets by mouth daily.   Lancets (ONETOUCH DELICA PLUS [id number]) MISC    lovastatin (MEVACOR) 20 MG tablet Take 20 mg by mouth at bedtime.   Probiotic Product (ALIGN) 4 MG CAPS Take 4 mg by mouth daily.   traZODone (DESYREL) 50 MG tablet Take 50 mg by mouth at bedtime as needed for sleep.     Allergies  Allergen Reactions   Levaquin [Levofloxacin In D5w] Nausea And Vomiting   Lisinopril Cough    Social History   Socioeconomic History   Marital status: Married    Spouse name: Not on file   Number of children: Not on file   Years of education: Not on file   Highest education level: Not on file  Occupational History   Not on file  Tobacco Use   Smoking status: Former    Packs/day: 1.00    Years: 20.00    Total pack years: 20.00  Types: Cigarettes   Smokeless tobacco: Never   Tobacco comments:    quit smoking 30 yrs ago  Vaping Use   Vaping Use: Never used  Substance and Sexual Activity   Alcohol use: No   Drug use: No   Sexual activity: Not on file  Other Topics Concern   Not on file  Social History Narrative   Not on file   Social Determinants of Health   Financial Resource Strain: Not on file  Food Insecurity: Not on file  Transportation Needs: Not on file  Physical Activity: Not on file  Stress: Not on file  Social Connections: Not on file  Intimate Partner Violence: Not on file     Review of Systems: General: negative for chills, fever, night sweats or weight changes.  Cardiovascular: negative for  chest pain, dyspnea on exertion, edema, orthopnea, palpitations, paroxysmal nocturnal dyspnea or shortness of breath Dermatological: negative for rash Respiratory: negative for cough or wheezing Urologic: negative for hematuria Abdominal: negative for nausea, vomiting, diarrhea, bright red blood per rectum, melena, or hematemesis Neurologic: negative for visual changes, syncope, or dizziness All other systems reviewed and are otherwise negative except as noted above.    Blood pressure (!) 155/81, pulse 73, height '5\' 2"'$  (1.575 m), weight 259 lb 9.6 oz (117.8 kg), SpO2 94 %.  General appearance: alert and no distress Neck: no adenopathy, no carotid bruit, no JVD, supple, symmetrical, trachea midline, and thyroid not enlarged, symmetric, no tenderness/mass/nodules Lungs: clear to auscultation bilaterally Heart: regular rate and rhythm, S1, S2 normal, no murmur, click, rub or gallop Extremities: 2+ left pretibial edema, 1+ right with some superimposed erythema. Pulses: 2+ and symmetric Skin: Erythematous changes left calf Neurologic: Grossly normal  EKG sinus rhythm at 73 with left bundle branch block.  I personally reviewed this EKG.  ASSESSMENT AND PLAN:   Bilateral lower extremity edema Bilateral lower extremity edema presumably related to diastolic dysfunction.  She was discharged on furosemide 20 mg a day with instructions to take double the dose if she has increased edema.  Over the last several days she has had swelling of her left calf greater than right with some superimposed erythema.  She is not on Eliquis.  I am going to get venous Dopplers to rule out DVT.  Essential hypertension History of essential hypertension blood pressure measured today at 155/81.  She is on low-dose amlodipine, metoprolol and hydralazine.  Blood pressure at home which they take regularly was much lower than this.  Mixed hyperlipidemia History of hyperlipidemia on statin therapy with lipid profile  performed 11/22/2021 revealing total cholesterol 124, LDL of 59 HDL 46.  Acute diastolic CHF (congestive heart failure) (HCC) History of diastolic heart failure with 2D echo performed 12/21/2021 revealing an EF of 45 to 50% with grade 2 diastolic dysfunction.  She is on oral diuretic.  Bilateral pulmonary embolism (HCC) History of bilateral pulmonary emboli in October last year in the setting of COVID.  She was on oral anticoagulation for a period of time after that.  PAF (paroxysmal atrial fibrillation) ([place]) History of PAF in October of last year and during recent admission 12/29/2021.  She did convert to sinus rhythm. This patients CHA2DS2-VASc Score and unadjusted Ischemic Stroke Rate (% per year) is equal to 9.7 % stroke rate/year from a score of 6  Above score calculated as 1 point each if present [CHF, HTN, DM, Vascular=MI/PAD/Aortic Plaque, Age if 65-74, or Female] Above score calculated as 2 points each if present [Age >  75, or Stroke/TIA/TE]  She was discharged home on Eliquis which she has yet to start.  I have encouraged her to start her Eliquis ASAP.  She currently is in sinus rhythm.     Lorretta Harp MD FACP,FACC,FAHA, [facility] 02/16/2022 11:14 AM

## 2022-02-16 NOTE — Assessment & Plan Note (Signed)
History of bilateral pulmonary emboli in October last year in the setting of COVID.  She was on oral anticoagulation for a period of time after that.

## 2022-02-16 NOTE — Patient Instructions (Signed)
Medication Instructions:   -Restart apixaban (eliquis) '5mg'$  twice daily.  *If you need a refill on your cardiac medications before your next appointment, please call your pharmacy*   Testing/Procedures: Your physician has requested that you have a lower extremity venous duplex. This test is an ultrasound of the veins in the legs. It looks at venous blood flow that carries blood from the heart to the legs. Allow one hour for a Lower Venous exam. There are no restrictions or special instructions. This procedure will be done at [place]. [address]     Follow-Up: At [facility], you and your health needs are our priority.  As part of our continuing mission to provide you with exceptional heart care, we have created designated Provider Care Teams.  These Care Teams include your primary Cardiologist (physician) and Advanced Practice Providers (APPs -  Physician Assistants and Nurse Practitioners) who all work together to provide you with the care you need, when you need it.  We recommend signing up for the patient portal called "MyChart".  Sign up information is provided on this After Visit Summary.  MyChart is used to connect with patients for Virtual Visits (Telemedicine).  Patients are able to view lab/test results, encounter notes, upcoming appointments, etc.  Non-urgent messages can be sent to your provider as well.   To learn more about what you can do with MyChart, go to [email or web address].    Your next appointment:   6 month(s)  The format for your next appointment:   In Person  Provider:   Fabian Sharp, PA-C, Sande Rives, PA-C, Caron Presume, PA-C, Jory Sims, DNP, ANP, Almyra Deforest, PA-C, or Diona Browner, NP      Then, Quay Burow, MD will plan to see you again in 12 month(s).

## 2022-02-16 NOTE — Assessment & Plan Note (Signed)
Bilateral lower extremity edema presumably related to diastolic dysfunction.  She was discharged on furosemide 20 mg a day with instructions to take double the dose if she has increased edema.  Over the last several days she has had swelling of her left calf greater than right with some superimposed erythema.  She is not on Eliquis.  I am going to get venous Dopplers to rule out DVT.

## 2022-02-16 NOTE — Progress Notes (Signed)
Medication samples have been provided to the patient at office visit.  Drug name: Eliquis '5mg'$  Qty: 2 boxes LOT: [id number]  Exp.Date: 10/2023

## 2022-02-16 NOTE — Assessment & Plan Note (Signed)
History of hyperlipidemia on statin therapy with lipid profile performed 11/22/2021 revealing total cholesterol 124, LDL of 59 HDL 46.

## 2022-02-24 DIAGNOSIS — I509 Heart failure, unspecified: Secondary | ICD-10-CM | POA: Diagnosis not present

## 2022-03-24 DIAGNOSIS — H26493 Other secondary cataract, bilateral: Secondary | ICD-10-CM | POA: Diagnosis not present

## 2022-03-24 DIAGNOSIS — E109 Type 1 diabetes mellitus without complications: Secondary | ICD-10-CM | POA: Diagnosis not present

## 2022-03-24 DIAGNOSIS — Z961 Presence of intraocular lens: Secondary | ICD-10-CM | POA: Diagnosis not present

## 2022-03-29 DIAGNOSIS — M79605 Pain in left leg: Secondary | ICD-10-CM | POA: Diagnosis not present

## 2022-03-29 DIAGNOSIS — M25562 Pain in left knee: Secondary | ICD-10-CM | POA: Diagnosis not present

## 2022-03-31 DIAGNOSIS — E7800 Pure hypercholesterolemia, unspecified: Secondary | ICD-10-CM | POA: Diagnosis not present

## 2022-03-31 DIAGNOSIS — E1122 Type 2 diabetes mellitus with diabetic chronic kidney disease: Secondary | ICD-10-CM | POA: Diagnosis not present

## 2022-03-31 DIAGNOSIS — M1990 Unspecified osteoarthritis, unspecified site: Secondary | ICD-10-CM | POA: Diagnosis not present

## 2022-03-31 DIAGNOSIS — I129 Hypertensive chronic kidney disease with stage 1 through stage 4 chronic kidney disease, or unspecified chronic kidney disease: Secondary | ICD-10-CM | POA: Diagnosis not present

## 2022-04-14 DIAGNOSIS — M1990 Unspecified osteoarthritis, unspecified site: Secondary | ICD-10-CM | POA: Diagnosis not present

## 2022-04-14 DIAGNOSIS — D631 Anemia in chronic kidney disease: Secondary | ICD-10-CM | POA: Diagnosis not present

## 2022-04-14 DIAGNOSIS — E7800 Pure hypercholesterolemia, unspecified: Secondary | ICD-10-CM | POA: Diagnosis not present

## 2022-04-14 DIAGNOSIS — G4700 Insomnia, unspecified: Secondary | ICD-10-CM | POA: Diagnosis not present

## 2022-04-14 DIAGNOSIS — I129 Hypertensive chronic kidney disease with stage 1 through stage 4 chronic kidney disease, or unspecified chronic kidney disease: Secondary | ICD-10-CM | POA: Diagnosis not present

## 2022-05-25 DIAGNOSIS — M1990 Unspecified osteoarthritis, unspecified site: Secondary | ICD-10-CM | POA: Diagnosis not present

## 2022-05-25 DIAGNOSIS — I700 Atherosclerosis of aorta: Secondary | ICD-10-CM | POA: Diagnosis not present

## 2022-05-25 DIAGNOSIS — Z86711 Personal history of pulmonary embolism: Secondary | ICD-10-CM | POA: Diagnosis not present

## 2022-05-25 DIAGNOSIS — G4700 Insomnia, unspecified: Secondary | ICD-10-CM | POA: Diagnosis not present

## 2022-05-25 DIAGNOSIS — R54 Age-related physical debility: Secondary | ICD-10-CM | POA: Diagnosis not present

## 2022-05-25 DIAGNOSIS — I129 Hypertensive chronic kidney disease with stage 1 through stage 4 chronic kidney disease, or unspecified chronic kidney disease: Secondary | ICD-10-CM | POA: Diagnosis not present

## 2022-05-25 DIAGNOSIS — E1122 Type 2 diabetes mellitus with diabetic chronic kidney disease: Secondary | ICD-10-CM | POA: Diagnosis not present

## 2022-05-25 DIAGNOSIS — R69 Illness, unspecified: Secondary | ICD-10-CM | POA: Diagnosis not present

## 2022-05-25 DIAGNOSIS — Z23 Encounter for immunization: Secondary | ICD-10-CM | POA: Diagnosis not present

## 2022-05-25 DIAGNOSIS — N1832 Chronic kidney disease, stage 3b: Secondary | ICD-10-CM | POA: Diagnosis not present

## 2022-05-25 DIAGNOSIS — I509 Heart failure, unspecified: Secondary | ICD-10-CM | POA: Diagnosis not present

## 2022-07-25 DIAGNOSIS — I129 Hypertensive chronic kidney disease with stage 1 through stage 4 chronic kidney disease, or unspecified chronic kidney disease: Secondary | ICD-10-CM | POA: Diagnosis not present

## 2022-07-25 DIAGNOSIS — M1990 Unspecified osteoarthritis, unspecified site: Secondary | ICD-10-CM | POA: Diagnosis not present

## 2022-07-25 DIAGNOSIS — E7800 Pure hypercholesterolemia, unspecified: Secondary | ICD-10-CM | POA: Diagnosis not present

## 2022-07-25 DIAGNOSIS — G4700 Insomnia, unspecified: Secondary | ICD-10-CM | POA: Diagnosis not present

## 2022-07-25 DIAGNOSIS — M8580 Other specified disorders of bone density and structure, unspecified site: Secondary | ICD-10-CM | POA: Diagnosis not present

## 2022-07-25 DIAGNOSIS — N2581 Secondary hyperparathyroidism of renal origin: Secondary | ICD-10-CM | POA: Diagnosis not present

## 2022-07-25 DIAGNOSIS — N183 Chronic kidney disease, stage 3 unspecified: Secondary | ICD-10-CM | POA: Diagnosis not present

## 2022-07-25 DIAGNOSIS — E1122 Type 2 diabetes mellitus with diabetic chronic kidney disease: Secondary | ICD-10-CM | POA: Diagnosis not present

## 2022-07-25 DIAGNOSIS — I509 Heart failure, unspecified: Secondary | ICD-10-CM | POA: Diagnosis not present

## 2022-09-14 DIAGNOSIS — I129 Hypertensive chronic kidney disease with stage 1 through stage 4 chronic kidney disease, or unspecified chronic kidney disease: Secondary | ICD-10-CM | POA: Diagnosis not present

## 2022-09-14 DIAGNOSIS — E1122 Type 2 diabetes mellitus with diabetic chronic kidney disease: Secondary | ICD-10-CM | POA: Diagnosis not present

## 2022-09-14 DIAGNOSIS — N183 Chronic kidney disease, stage 3 unspecified: Secondary | ICD-10-CM | POA: Diagnosis not present

## 2022-09-14 DIAGNOSIS — D631 Anemia in chronic kidney disease: Secondary | ICD-10-CM | POA: Diagnosis not present

## 2022-09-14 DIAGNOSIS — N2581 Secondary hyperparathyroidism of renal origin: Secondary | ICD-10-CM | POA: Diagnosis not present

## 2022-09-14 DIAGNOSIS — N390 Urinary tract infection, site not specified: Secondary | ICD-10-CM | POA: Diagnosis not present

## 2022-09-14 DIAGNOSIS — I5020 Unspecified systolic (congestive) heart failure: Secondary | ICD-10-CM | POA: Diagnosis not present

## 2022-10-04 DIAGNOSIS — I129 Hypertensive chronic kidney disease with stage 1 through stage 4 chronic kidney disease, or unspecified chronic kidney disease: Secondary | ICD-10-CM | POA: Diagnosis not present

## 2022-12-20 DIAGNOSIS — I700 Atherosclerosis of aorta: Secondary | ICD-10-CM | POA: Diagnosis not present

## 2022-12-20 DIAGNOSIS — D6869 Other thrombophilia: Secondary | ICD-10-CM | POA: Diagnosis not present

## 2022-12-20 DIAGNOSIS — R54 Age-related physical debility: Secondary | ICD-10-CM | POA: Diagnosis not present

## 2022-12-20 DIAGNOSIS — Z86711 Personal history of pulmonary embolism: Secondary | ICD-10-CM | POA: Diagnosis not present

## 2022-12-20 DIAGNOSIS — N2581 Secondary hyperparathyroidism of renal origin: Secondary | ICD-10-CM | POA: Diagnosis not present

## 2022-12-20 DIAGNOSIS — N1832 Chronic kidney disease, stage 3b: Secondary | ICD-10-CM | POA: Diagnosis not present

## 2022-12-20 DIAGNOSIS — E1122 Type 2 diabetes mellitus with diabetic chronic kidney disease: Secondary | ICD-10-CM | POA: Diagnosis not present

## 2022-12-20 DIAGNOSIS — E7800 Pure hypercholesterolemia, unspecified: Secondary | ICD-10-CM | POA: Diagnosis not present

## 2022-12-20 DIAGNOSIS — I480 Paroxysmal atrial fibrillation: Secondary | ICD-10-CM | POA: Diagnosis not present

## 2022-12-20 DIAGNOSIS — I509 Heart failure, unspecified: Secondary | ICD-10-CM | POA: Diagnosis not present

## 2022-12-20 DIAGNOSIS — I130 Hypertensive heart and chronic kidney disease with heart failure and stage 1 through stage 4 chronic kidney disease, or unspecified chronic kidney disease: Secondary | ICD-10-CM | POA: Diagnosis not present

## 2022-12-20 DIAGNOSIS — Z6841 Body Mass Index (BMI) 40.0 and over, adult: Secondary | ICD-10-CM | POA: Diagnosis not present

## 2023-02-21 DIAGNOSIS — H524 Presbyopia: Secondary | ICD-10-CM | POA: Diagnosis not present

## 2023-02-21 DIAGNOSIS — Z961 Presence of intraocular lens: Secondary | ICD-10-CM | POA: Diagnosis not present

## 2023-02-21 DIAGNOSIS — H26491 Other secondary cataract, right eye: Secondary | ICD-10-CM | POA: Diagnosis not present

## 2023-02-21 DIAGNOSIS — E113291 Type 2 diabetes mellitus with mild nonproliferative diabetic retinopathy without macular edema, right eye: Secondary | ICD-10-CM | POA: Diagnosis not present

## 2023-02-21 DIAGNOSIS — H35443 Age-related reticular degeneration of retina, bilateral: Secondary | ICD-10-CM | POA: Diagnosis not present

## 2023-02-23 ENCOUNTER — Emergency Department (HOSPITAL_COMMUNITY): Payer: Medicare HMO

## 2023-02-23 ENCOUNTER — Encounter (HOSPITAL_COMMUNITY): Payer: Self-pay

## 2023-02-23 ENCOUNTER — Other Ambulatory Visit: Payer: Self-pay

## 2023-02-23 ENCOUNTER — Emergency Department (HOSPITAL_COMMUNITY)
Admission: EM | Admit: 2023-02-23 | Discharge: 2023-02-23 | Disposition: A | Discharge status: Home / Self Care | Payer: Medicare HMO | Attending: Emergency Medicine | Admitting: Emergency Medicine

## 2023-02-23 DIAGNOSIS — E1122 Type 2 diabetes mellitus with diabetic chronic kidney disease: Secondary | ICD-10-CM | POA: Insufficient documentation

## 2023-02-23 DIAGNOSIS — I10 Essential (primary) hypertension: Secondary | ICD-10-CM | POA: Diagnosis not present

## 2023-02-23 DIAGNOSIS — Z7901 Long term (current) use of anticoagulants: Secondary | ICD-10-CM | POA: Diagnosis not present

## 2023-02-23 DIAGNOSIS — Z7984 Long term (current) use of oral hypoglycemic drugs: Secondary | ICD-10-CM | POA: Insufficient documentation

## 2023-02-23 DIAGNOSIS — R030 Elevated blood-pressure reading, without diagnosis of hypertension: Secondary | ICD-10-CM | POA: Insufficient documentation

## 2023-02-23 DIAGNOSIS — K429 Umbilical hernia without obstruction or gangrene: Secondary | ICD-10-CM | POA: Diagnosis not present

## 2023-02-23 DIAGNOSIS — I130 Hypertensive heart and chronic kidney disease with heart failure and stage 1 through stage 4 chronic kidney disease, or unspecified chronic kidney disease: Secondary | ICD-10-CM | POA: Diagnosis not present

## 2023-02-23 DIAGNOSIS — K5750 Diverticulosis of both small and large intestine without perforation or abscess without bleeding: Secondary | ICD-10-CM | POA: Diagnosis not present

## 2023-02-23 DIAGNOSIS — N189 Chronic kidney disease, unspecified: Secondary | ICD-10-CM | POA: Diagnosis not present

## 2023-02-23 DIAGNOSIS — R197 Diarrhea, unspecified: Secondary | ICD-10-CM | POA: Diagnosis not present

## 2023-02-23 DIAGNOSIS — N281 Cyst of kidney, acquired: Secondary | ICD-10-CM | POA: Diagnosis not present

## 2023-02-23 DIAGNOSIS — Z79899 Other long term (current) drug therapy: Secondary | ICD-10-CM | POA: Insufficient documentation

## 2023-02-23 DIAGNOSIS — I509 Heart failure, unspecified: Secondary | ICD-10-CM | POA: Insufficient documentation

## 2023-02-23 DIAGNOSIS — Z743 Need for continuous supervision: Secondary | ICD-10-CM | POA: Diagnosis not present

## 2023-02-23 DIAGNOSIS — R1084 Generalized abdominal pain: Secondary | ICD-10-CM | POA: Insufficient documentation

## 2023-02-23 LAB — URINALYSIS, ROUTINE W REFLEX MICROSCOPIC
Bilirubin Urine: NEGATIVE
Glucose, UA: >=500 mg/dL — AB
Ketones, ur: 5 mg/dL — AB
Leukocytes,Ua: NEGATIVE
Nitrite: NEGATIVE
Protein, ur: >=300 mg/dL — AB
Specific Gravity, Urine: 1.022 (ref 1.005–1.030)
pH: 5 (ref 5.0–8.0)

## 2023-02-23 LAB — CBC WITH DIFFERENTIAL/PLATELET
Abs Immature Granulocytes: 0.04 10*3/uL (ref 0.00–0.07)
Basophils Absolute: 0.1 10*3/uL (ref 0.0–0.1)
Basophils Relative: 1 %
Eosinophils Absolute: 0.1 10*3/uL (ref 0.0–0.5)
Eosinophils Relative: 1 %
HCT: 51.1 % — ABNORMAL HIGH (ref 36.0–46.0)
Hemoglobin: 16.5 g/dL — ABNORMAL HIGH (ref 12.0–15.0)
Immature Granulocytes: 0 %
Lymphocytes Relative: 11 %
Lymphs Abs: 1.4 10*3/uL (ref 0.7–4.0)
MCH: 31.7 pg (ref 26.0–34.0)
MCHC: 32.3 g/dL (ref 30.0–36.0)
MCV: 98.3 fL (ref 80.0–100.0)
Monocytes Absolute: 0.9 10*3/uL (ref 0.1–1.0)
Monocytes Relative: 7 %
Neutro Abs: 10.2 10*3/uL — ABNORMAL HIGH (ref 1.7–7.7)
Neutrophils Relative %: 80 %
Platelets: 195 10*3/uL (ref 150–400)
RBC: 5.2 MIL/uL — ABNORMAL HIGH (ref 3.87–5.11)
RDW: 13.6 % (ref 11.5–15.5)
WBC: 12.7 10*3/uL — ABNORMAL HIGH (ref 4.0–10.5)
nRBC: 0 % (ref 0.0–0.2)

## 2023-02-23 LAB — COMPREHENSIVE METABOLIC PANEL
ALT: 19 U/L (ref 0–44)
AST: 18 U/L (ref 15–41)
Albumin: 3.8 g/dL (ref 3.5–5.0)
Alkaline Phosphatase: 70 U/L (ref 38–126)
Anion gap: 11 (ref 5–15)
BUN: 30 mg/dL — ABNORMAL HIGH (ref 8–23)
CO2: 22 mmol/L (ref 22–32)
Calcium: 9 mg/dL (ref 8.9–10.3)
Chloride: 106 mmol/L (ref 98–111)
Creatinine, Ser: 1.26 mg/dL — ABNORMAL HIGH (ref 0.44–1.00)
GFR, Estimated: 42 mL/min — ABNORMAL LOW (ref >60)
Glucose, Bld: 117 mg/dL — ABNORMAL HIGH (ref 70–99)
Potassium: 4 mmol/L (ref 3.5–5.1)
Sodium: 139 mmol/L (ref 135–145)
Total Bilirubin: 0.8 mg/dL (ref 0.3–1.2)
Total Protein: 7.5 g/dL (ref 6.5–8.1)

## 2023-02-23 LAB — LIPASE, BLOOD: Lipase: 23 U/L (ref 11–51)

## 2023-02-23 MED ORDER — IOHEXOL 300 MG/ML  SOLN
80.0000 mL | Freq: Once | INTRAMUSCULAR | Status: AC | PRN
  Administered 2023-02-23: 80 mL via INTRAVENOUS

## 2023-02-23 MED ORDER — SODIUM CHLORIDE (PF) 0.9 % IJ SOLN
INTRAMUSCULAR | Status: AC
  Filled 2023-02-23: qty 50

## 2023-02-23 NOTE — ED Triage Notes (Signed)
Patient BIB EMS for abdominal pain and diarrhea. States abdominal pain started yesterday. Thought she was constipated and took a stool softener. This AM she has had multiple episodes of diarrhea. Patient denies n/v

## 2023-02-23 NOTE — Discharge Instructions (Addendum)
As we discussed, your workup in the ER today was reassuring for acute findings.  Laboratory evaluation and CT imaging of his abdomen did not reveal any emergent cause of your symptoms. I suspect that your pain was likely due to the constipation and your diarrhea was due to overuse of laxatives. I recommend only taking laxatives as prescribed as needed in the future.  Please follow-up closely with your primary doctor.  Additionally, your blood pressure was notably elevated today which is likely due to you not taking your morning medication yet.  Please take this medication as soon as you get home and monitor her your blood pressure readings to ensure that it normalizes.   Return if development of any new or worsening symptoms.

## 2023-02-23 NOTE — ED Provider Notes (Signed)
[place] EMERGENCY DEPARTMENT AT [facility] Provider Note   CSN: [id number] Arrival date & time: 02/23/23  4010     History  Chief Complaint  Patient presents with   Abdominal Pain    Pamela [name] is a 88 y.o. female.  Patient with history of CHF, CKD, hypertension, hyperlipidemia, diabetes, PE on Elliquis, and afib presents today with complaints of abdominal pain. She states that same began yesterday afternoon. She originally thought she was constipated and took Ducolax for same. However, soon after taking it she read the box which noted that one should have start noting the effects of the medication around 6-12 hours after taking the medication. She was frustrated with this timeframe and therefore took an additional laxative as well. A few hours later she began having diarrhea which has been persistent since then. She denies any hematochezia or melena. States she was up all night having diarrhea. States that her pain has improved a bit but is still present. Pain is generalized throughout her abdomen with no focal areas of pain. Denies any nausea or vomiting. Notes a history of cholecystectomy, appendectomy, and hysterectomy with oophorectomy. Additionally, of note patient does state that she has not had her morning dose of home blood pressure medications.  The history is provided by the patient. No language interpreter was used.  Abdominal Pain Associated symptoms: diarrhea        Home Medications Prior to Admission medications   Medication Sig Start Date End Date Taking? Authorizing Provider  acetaminophen (TYLENOL 8 HOUR ARTHRITIS PAIN) 650 MG CR tablet Take 2 tablets by mouth in the morning and at bedtime.    [provider]  ALPRAZolam Prudy Feeler) 0.25 MG tablet Take 0.125-0.25 mg by mouth 2 (two) times daily as needed. 01/13/22   [provider]  amLODipine (NORVASC) 2.5 MG tablet Take 2.5 mg by mouth daily. 02/01/22   [provider]   apixaban (ELIQUIS) 5 MG TABS tablet Take 1 tablet (5 mg total) by mouth 2 (two) times daily. 02/16/22   Runell Gess, MD  calcium carbonate (OSCAL) 1500 (600 Ca) MG TABS tablet Take 1,500 mg by mouth 2 (two) times daily with a meal.    [provider]  dapagliflozin propanediol (FARXIGA) 10 MG TABS tablet Take 10 mg by mouth daily.    [provider]  diclofenac Sodium (VOLTAREN) 1 % GEL Apply 2 g topically 4 (four) times daily. 01/03/22   Marguerita Merles Latif, DO  furosemide (LASIX) 20 MG tablet Take 1 tablet (20 mg total) by mouth daily as needed for edema (or weight gain). 05/06/21 05/06/22  Esaw Grandchild A, DO  K Phos Mono-Sod Phos Di & Mono (PHOSPHA 250 NEUTRAL) [id number] MG TABS Take 2 tablets by mouth daily. 01/31/22   [provider]  Lancets [facility] DELICA PLUS [place]) MISC  07/09/21   [provider]  lovastatin (MEVACOR) 20 MG tablet Take 20 mg by mouth at bedtime.    [provider]  OVER THE COUNTER MEDICATION Take 1 capsule by mouth daily. Daily fiber 100% psyllium. Patient not taking: Reported on 02/16/2022    [provider]  Probiotic Product (ALIGN) 4 MG CAPS Take 4 mg by mouth daily.    [provider]  traZODone (DESYREL) 50 MG tablet Take 50 mg by mouth at bedtime as needed for sleep.    [provider]      Allergies    Levaquin [levofloxacin in d5w] and Lisinopril  Review of Systems   Review of Systems  Gastrointestinal:  Positive for abdominal pain and diarrhea.  All other systems reviewed and are negative.   Physical Exam Updated Vital Signs BP (!) 188/90 (BP Location: Right Wrist)   Pulse (!) 101   Temp 97.7 F (36.5 C) (Oral)   Resp 18   Ht 5\' 2"  (1.575 m)   Wt 117.8 kg   SpO2 94%   BMI 47.50 kg/m  Physical Exam Vitals and nursing note reviewed.  Constitutional:      General: She is not in acute distress.    Appearance: Normal appearance. She is obese. She is not  ill-appearing, toxic-appearing or diaphoretic.  HENT:     Head: Normocephalic and atraumatic.  Cardiovascular:     Rate and Rhythm: Normal rate.  Pulmonary:     Effort: Pulmonary effort is normal. No respiratory distress.  Abdominal:     General: Abdomen is flat.     Palpations: Abdomen is soft.     Tenderness: There is generalized abdominal tenderness.     Comments: No wounds, erythema, warmth, fluctuance, or induration noted to the abdominal wall or pannus upon thorough inspection.   Musculoskeletal:        General: Normal range of motion.     Cervical back: Normal range of motion.  Skin:    General: Skin is warm and dry.  Neurological:     General: No focal deficit present.     Mental Status: She is alert.  Psychiatric:        Mood and Affect: Mood normal.        Behavior: Behavior normal.     ED Results / Procedures / Treatments   Labs (all labs ordered are listed, but only abnormal results are displayed) Labs Reviewed  COMPREHENSIVE METABOLIC PANEL - Abnormal; Notable for the following components:      Result Value   Glucose, Bld 117 (*)    BUN 30 (*)    Creatinine, Ser 1.26 (*)    GFR, Estimated 42 (*)    All other components within normal limits  CBC WITH DIFFERENTIAL/PLATELET - Abnormal; Notable for the following components:   WBC 12.7 (*)    RBC 5.20 (*)    Hemoglobin 16.5 (*)    HCT 51.1 (*)    Neutro Abs 10.2 (*)    All other components within normal limits  URINALYSIS, ROUTINE W REFLEX MICROSCOPIC - Abnormal; Notable for the following components:   APPearance HAZY (*)    Glucose, UA >=500 (*)    Hgb urine dipstick SMALL (*)    Ketones, ur 5 (*)    Protein, ur >=300 (*)    Bacteria, UA MANY (*)    All other components within normal limits  LIPASE, BLOOD    EKG None  Radiology CT ABDOMEN PELVIS W CONTRAST  Result Date: 02/23/2023 CLINICAL DATA:  Abdominal pain, acute, nonlocalized EXAM: CT ABDOMEN AND PELVIS WITH CONTRAST TECHNIQUE: Multidetector  CT imaging of the abdomen and pelvis was performed using the standard protocol following bolus administration of intravenous contrast. RADIATION DOSE REDUCTION: This exam was performed according to the departmental dose-optimization program which includes automated exposure control, adjustment of the mA and/or kV according to patient size and/or use of iterative reconstruction technique. CONTRAST:  80mL OMNIPAQUE IOHEXOL 300 MG/ML  SOLN COMPARISON:  CT scan abdomen and pelvis from 11/03/2018. FINDINGS: Lower chest: There is a 5.3 x 7.1 mm oval noncalcified nodule in the right lung lower lobe, unchanged  since the prior study from 11/03/2018. There is small nonocclusive filling defect in the right lower lobe distal subsegmental bronchus (series 6, image 35), likely mucous/secretion. There are patchy atelectatic changes in the visualized lung bases. No overt consolidation. No pleural effusion. No new suspicious lung nodule. The heart is normal in size. No pericardial effusion. Hepatobiliary: The liver is normal in size. Non-cirrhotic configuration. No suspicious mass. These is mild diffuse hepatic steatosis. No intrahepatic bile duct dilation. There is mild prominence of the extrahepatic bile duct, most likely due to post cholecystectomy status. Gallbladder is surgically absent. Pancreas: Unremarkable. No pancreatic ductal dilatation or surrounding inflammatory changes. Spleen: Within normal limits. No focal lesion. Adrenals/Urinary Tract: Adrenal glands are unremarkable. Bilateral kidneys are small/atrophic. There are multiple cysts throughout bilateral kidneys, right more than left. Largest cyst is exophytically arising from the right kidney lower pole and measures up to 2.7 x 2.7 cm. No suspicious renal mass. No hydronephrosis or nephroureterolithiasis on either side. Urinary bladder is under distended, precluding optimal assessment. However, no large mass or stones identified. No perivesical fat stranding.  Stomach/Bowel: No disproportionate dilation of the small or large bowel loops. No evidence of abnormal bowel wall thickening or inflammatory changes. The appendix is surgically absent. There are multiple diverticula mainly in the sigmoid colon, without imaging signs of diverticulitis. There is also a small/medium diverticulum arising from the 2/3 part of duodenum. There are multiple additional smaller diverticula arising from the small bowel loops. Vascular/Lymphatic: No ascites or pneumoperitoneum. No abdominal or pelvic lymphadenopathy, by size criteria. Redemonstration of ectasia of infrarenal aorta measuring up to 2.7 cm in diameter, grossly similar to the prior study. Otherwise, no aneurysmal dilation of the major abdominal arteries. There are moderate peripheral atherosclerotic vascular calcifications of the aorta and its major branches. There are patchy areas of peripheral intraluminal thrombus/hematomas in the abdominal aorta most pronounced at the level of diaphragmatic hiatus on the left side. Reproductive: The uterus is surgically absent. No large adnexal mass. Other: There is a tiny fat containing umbilical hernia. There is mild asymmetric thickening of the skin in the midline, infra umbilical anterior abdominal wall with associated subcutaneous fat stranding. In appropriate clinical settings findings may represent cellulitis. Correlate clinically. Musculoskeletal: No suspicious osseous lesions. There are mild - moderate multilevel degenerative changes in the visualized spine. IMPRESSION: 1. Mild asymmetric thickening of the skin in the midline, infra umbilical anterior abdominal wall with associated subcutaneous fat stranding. In appropriate clinical settings findings may represent cellulitis. 2. Otherwise, no acute inflammatory process identified within the abdomen or pelvis. 3. Multiple other nonacute observations, as described above. Electronically Signed   By: Jules Schick M.D.   On: 02/23/2023  11:15    Procedures Procedures    Medications Ordered in ED Medications  iohexol (OMNIPAQUE) 300 MG/ML solution 80 mL (80 mLs Intravenous Contrast Given 02/23/23 1027)    ED Course/ Medical Decision Making/ A&P                                 Medical Decision Making Amount and/or Complexity of Data Reviewed Labs: ordered. Radiology: ordered.  Risk Prescription drug management.   This patient is a 88 y.o. female who presents to the ED for concern of abdominal pain, this involves an extensive number of treatment options, and is a complaint that carries with it a high risk of complications and morbidity. The emergent differential diagnosis prior to evaluation includes, but  is not limited to,  The differential diagnosis for generalized abdominal pain includes, but is not limited to AAA, gastroenteritis, appendicitis, Bowel obstruction, Bowel perforation. Gastroparesis, DKA, Hernia, Inflammatory bowel disease, pancreatitis, volvulus.   This is not an exhaustive differential.   Past Medical History / Co-morbidities / Social History: history of CHF, CKD, hypertension, hyperlipidemia, diabetes, PE on Elliquis, and afib   SDOH Screenings   Tobacco Use: Medium Risk (02/23/2023)    has a past medical history of Acute diastolic CHF (congestive heart failure) (HCC) (10/11/2019), Arthritis, Bilateral lower extremity edema, Common bile duct stone (06/22/2017), Diabetes (HCC), Diverticulosis, Duodenal diverticulum, periampullary (11/03/2018), GERD (gastroesophageal reflux disease), History of hiatal hernia, Hypertension, Kidney disease, chronic, stage III (GFR 30-59 ml/min) (HCC), Obesity, Osteopenia, Seizures (HCC) (22 yrs ago), and Tubular adenoma of colon (2009).  Additional history: Chart reviewed.  Physical Exam: Physical exam performed. The pertinent findings include: Abdomen soft and non-tender  Lab Tests: I ordered, and personally interpreted labs.  The pertinent results include:  WBC  12.7, hgb 16.5, creatinine 1.26 improved from previous. UA noninfectious   Imaging Studies: I ordered imaging studies including CT abdomen pelvis. I independently visualized and interpreted imaging which showed   1. Mild asymmetric thickening of the skin in the midline, infra umbilical anterior abdominal wall with associated subcutaneous fat stranding. In appropriate clinical settings findings may represent cellulitis. 2. Otherwise, no acute inflammatory process identified within the abdomen or pelvis. 3. Multiple other nonacute observations, as described above.  I agree with the radiologist interpretation.  Disposition: After consideration of the diagnostic results and the patients response to treatment, I feel that emergency department workup does not suggest an emergent condition requiring admission or immediate intervention beyond what has been performed at this time. The plan is: discharge with close monitoring and outpatient follow-up. Ct reads concern for abdominal wall cellulitis, however upon direct inspection there is no clinical correlation with this read as patients abdomen is not erythematous or warm with no fluctuance, induration, or crepitus. White count only 12.7. Discussed this with patient, will defer treatment at this time and she is understanding and in agreement this. Patients pain is mostly resolved and she is no longer having diarrhea. Suspect patients discomfort was from constipation and her diarrhea was from too many laxatives. Discussed same with patient who is in agreement. She is ready to go home. Of note, she is hypertensive, however has not had her morning medication. Recommend she take these as soon as she goes home. Evaluation and diagnostic testing in the emergency department does not suggest an emergent condition requiring admission or immediate intervention beyond what has been performed at this time.  Plan for discharge with close PCP follow-up.  Patient is  understanding and amenable with plan, educated on red flag symptoms that would prompt immediate return.  Patient discharged in stable condition.   This is a shared visit with supervising physician Dr. Rosalia Hammers who has independently evaluated patient & provided guidance in evaluation/management/disposition, in agreement with care   Final Clinical Impression(s) / ED Diagnoses Final diagnoses:  Generalized abdominal pain  Diarrhea, unspecified type  Elevated blood pressure reading    Rx / DC Orders ED Discharge Orders     None     An After Visit Summary was printed and given to the patient.     Vear Clock 02/23/23 1337    Margarita Grizzle, MD 02/24/23 [phone]

## 2023-05-02 ENCOUNTER — Telehealth: Payer: Self-pay | Admitting: Cardiovascular Disease

## 2023-05-02 NOTE — Telephone Encounter (Signed)
Called pt to schedule appt from recall, she said she didn't need appt she is not having any problems with her heart.

## 2023-06-22 DIAGNOSIS — E1122 Type 2 diabetes mellitus with diabetic chronic kidney disease: Secondary | ICD-10-CM | POA: Diagnosis not present

## 2023-06-22 DIAGNOSIS — Z0000 Encounter for general adult medical examination without abnormal findings: Secondary | ICD-10-CM | POA: Diagnosis not present

## 2023-06-22 DIAGNOSIS — I129 Hypertensive chronic kidney disease with stage 1 through stage 4 chronic kidney disease, or unspecified chronic kidney disease: Secondary | ICD-10-CM | POA: Diagnosis not present

## 2023-06-22 DIAGNOSIS — Z23 Encounter for immunization: Secondary | ICD-10-CM | POA: Diagnosis not present

## 2023-06-22 DIAGNOSIS — I480 Paroxysmal atrial fibrillation: Secondary | ICD-10-CM | POA: Diagnosis not present

## 2023-06-22 DIAGNOSIS — D6869 Other thrombophilia: Secondary | ICD-10-CM | POA: Diagnosis not present

## 2023-06-22 DIAGNOSIS — N1832 Chronic kidney disease, stage 3b: Secondary | ICD-10-CM | POA: Diagnosis not present

## 2023-06-22 DIAGNOSIS — N2581 Secondary hyperparathyroidism of renal origin: Secondary | ICD-10-CM | POA: Diagnosis not present

## 2023-06-22 DIAGNOSIS — E113291 Type 2 diabetes mellitus with mild nonproliferative diabetic retinopathy without macular edema, right eye: Secondary | ICD-10-CM | POA: Diagnosis not present

## 2023-06-22 DIAGNOSIS — I509 Heart failure, unspecified: Secondary | ICD-10-CM | POA: Diagnosis not present

## 2023-06-22 DIAGNOSIS — R54 Age-related physical debility: Secondary | ICD-10-CM | POA: Diagnosis not present

## 2023-07-19 DIAGNOSIS — M170 Bilateral primary osteoarthritis of knee: Secondary | ICD-10-CM | POA: Diagnosis not present

## 2023-07-19 DIAGNOSIS — M25562 Pain in left knee: Secondary | ICD-10-CM | POA: Diagnosis not present

## 2023-10-02 DIAGNOSIS — N2581 Secondary hyperparathyroidism of renal origin: Secondary | ICD-10-CM | POA: Diagnosis not present

## 2023-10-02 DIAGNOSIS — N183 Chronic kidney disease, stage 3 unspecified: Secondary | ICD-10-CM | POA: Diagnosis not present

## 2023-10-06 DIAGNOSIS — D631 Anemia in chronic kidney disease: Secondary | ICD-10-CM | POA: Diagnosis not present

## 2023-10-06 DIAGNOSIS — N390 Urinary tract infection, site not specified: Secondary | ICD-10-CM | POA: Diagnosis not present

## 2023-10-06 DIAGNOSIS — I129 Hypertensive chronic kidney disease with stage 1 through stage 4 chronic kidney disease, or unspecified chronic kidney disease: Secondary | ICD-10-CM | POA: Diagnosis not present

## 2023-10-06 DIAGNOSIS — B9689 Other specified bacterial agents as the cause of diseases classified elsewhere: Secondary | ICD-10-CM | POA: Diagnosis not present

## 2023-10-06 DIAGNOSIS — N1832 Chronic kidney disease, stage 3b: Secondary | ICD-10-CM | POA: Diagnosis not present

## 2023-10-06 DIAGNOSIS — N2581 Secondary hyperparathyroidism of renal origin: Secondary | ICD-10-CM | POA: Diagnosis not present

## 2023-10-06 DIAGNOSIS — N183 Chronic kidney disease, stage 3 unspecified: Secondary | ICD-10-CM | POA: Diagnosis not present

## 2023-10-06 DIAGNOSIS — E1122 Type 2 diabetes mellitus with diabetic chronic kidney disease: Secondary | ICD-10-CM | POA: Diagnosis not present

## 2023-11-17 DIAGNOSIS — M170 Bilateral primary osteoarthritis of knee: Secondary | ICD-10-CM | POA: Diagnosis not present

## 2023-11-24 DIAGNOSIS — M170 Bilateral primary osteoarthritis of knee: Secondary | ICD-10-CM | POA: Diagnosis not present

## 2023-12-01 DIAGNOSIS — M170 Bilateral primary osteoarthritis of knee: Secondary | ICD-10-CM | POA: Diagnosis not present

## 2024-01-15 DIAGNOSIS — M25561 Pain in right knee: Secondary | ICD-10-CM | POA: Diagnosis not present

## 2024-01-15 DIAGNOSIS — M25562 Pain in left knee: Secondary | ICD-10-CM | POA: Diagnosis not present

## 2024-03-10 DIAGNOSIS — L03119 Cellulitis of unspecified part of limb: Secondary | ICD-10-CM | POA: Diagnosis not present

## 2024-03-10 NOTE — Progress Notes (Signed)
 Virtual Visit  Subjective: Subjective Patient ID: Pamela [name] is a 89 y.o. female.  HPI  Pt on video with her husband. Pt states she had a fall 2 weeks ago and since then has been only getting out of her recliner to use the bathroom. EMS came to her home after the fall helped her up; pt states she declined to be transported to the hospital because she felt fine. Had a Nurse Practitioner at her home yesterday doing a Medicare annual wellness visit and was encouraged to seek care for cellulitis of her legs. Both legs are red and warm to the touch. Pt denies pain. Pt states the NP took vital signs and listened to heart and lungs and had no other concerns.   Skin complaint  Patient presents: rash   Duration of current symptoms:  10 days Onset quality:  Slowly over time Associated symptoms: rash   Associated symptoms: no bleeding, no myalgias, no chills, no cough, no appetite change, no diarrhea, no eye problems, no fatigue, no fever, no headaches, no itching, no lesions, no malaise, no nail changes, no congestion, no nausea, no neck pain, no pain, no purulent discharge, no numbness, no rhinorrhea, no sore throat, no diaphoresis, no swollen glands, no paraparesis and no vomiting   Treatments tried:  Rest Worsened by: activity   Response to treatment:  No change Special considerations:  None apply Location skin: lower leg   Characteristics skin: redness    Review of Systems  Constitutional:  Negative for appetite change, chills, diaphoresis, fatigue and fever.  HENT:  Negative for congestion, rhinorrhea and sore throat.   Respiratory:  Negative for cough.   Gastrointestinal:  Negative for diarrhea, nausea and vomiting.  Musculoskeletal:  Negative for myalgias and neck pain.  Skin:  Positive for rash. Negative for itching and nail changes.  Neurological:  Negative for numbness and headaches.   Social History   Tobacco Use  Smoking Status Never  Smokeless Tobacco Never   Past Medical  History:  Diagnosis Date  . Diabetes mellitus (CMS/HCC and HHS/HCC)   . Hypertension   . Mixed hyperlipidemia    History reviewed. No pertinent surgical history. No family history on file.  Objective: Objective Physical Exam Constitutional:      General: She is not in acute distress.    Appearance: Normal appearance.  HENT:     Head: Normocephalic and atraumatic.  Pulmonary:     Effort: Pulmonary effort is normal. No respiratory distress.     Comments: Pt breathing normally, unlabored, and no signs of distress. Pt able to speak in complete sentences and inspire/expire without coughing or wheezing. Skin:    Findings: Erythema present.          Comments: Erythema to bilateral lower legs. Left leg with vesicular lesions. Pt reports area is warm with palpation but denies pain. ROM of ankles intact.  Neurological:     Mental Status: She is alert.  Psychiatric:        Attention and Perception: Attention and perception normal.        Mood and Affect: Mood and affect normal.        Speech: Speech normal.        Behavior: Behavior normal. Behavior is cooperative.        Thought Content: Thought content normal.       Assessment/Plan: Assessment HPI provided by Self  Based on today's visit:history and physical exam only, as no relevant testing deemed necessary patient's visit diagnosis is/includes  1. Cellulitis of lower extremity, unspecified laterality    Patient has a history of chronic conditions and those listed in the visit diagnoses were reviewed today. They are currently stable on medications.   Plan Treatment plan includes:  Orders Placed: No orders of the defined types were placed in this encounter.  Medications ordered this visit   Signed Prescriptions Disp Refills  . cephALEXin (KEFLEX) 500 MG capsule 40 capsule 0    Sig: Take 1 capsule (500 mg total) by mouth every 6 (six) hours for 10 days    Current medication list and any new medications prescribed or  recommended today were reviewed with the patient and specific instructions were provided Yes  Provider Recommendations   Pt agrees to contact her PCP tomorrow to set up an appointment. Expressed concern for pt being in her recliner for most of the day x 2 weeks. Proceeding with treatment due to pt having a NP visit yesterday and vital signs, heart and lung assessment WNL per pt. Get care immediately for any worsening symptoms.  If your symptoms have not improved within 72 hours, or if you experience any new or worsening symptoms, follow-up for in-person evaluation with a medical provider.  Pt remained stable and showing no signs of distress throughout visit. Reviewed red flag symptoms that indicate need for emergency care.  We recommend all patients participate in an in-person, annual physical wellness exam.  If you have any questions or concerns about your visit, please contact our virtual care customer support center at [phone].    Follow up care instructions were provided and reviewed?with the  Patient and Caregiver. All questions were answered. Patient and Caregiver verbalized understanding of plan of care today.                                               I have verified the patient's location, and I am licensed to practice in that state. Audio and video technology were used to conduct this virtual visit. Patient (or parent/guardian as applicable) consented to virtual care.  Patient is: not a minor

## 2024-03-11 NOTE — Telephone Encounter (Signed)
   Call [employer] Call being placed to:: Patient     Call Reason (PT) What is the reason for the call?: Acute Visit     ACUTE VISIT Patient Reached?: No Voicemail left?: Yes       Follow-up call to check on symptoms; no answer. Left VM with virtual care support number [phone]) and encouraged return call for any questions or concerns.

## 2024-03-28 ENCOUNTER — Inpatient Hospital Stay (HOSPITAL_COMMUNITY)
Admission: EM | Admit: 2024-03-28 | Discharge: 2024-04-04 | DRG: 291 | Disposition: A | Discharge status: Home Health | Attending: Internal Medicine | Admitting: Internal Medicine

## 2024-03-28 ENCOUNTER — Emergency Department (HOSPITAL_COMMUNITY)

## 2024-03-28 DIAGNOSIS — I5023 Acute on chronic systolic (congestive) heart failure: Secondary | ICD-10-CM | POA: Diagnosis not present

## 2024-03-28 DIAGNOSIS — I517 Cardiomegaly: Secondary | ICD-10-CM | POA: Diagnosis not present

## 2024-03-28 DIAGNOSIS — K2970 Gastritis, unspecified, without bleeding: Secondary | ICD-10-CM | POA: Diagnosis present

## 2024-03-28 DIAGNOSIS — E1151 Type 2 diabetes mellitus with diabetic peripheral angiopathy without gangrene: Secondary | ICD-10-CM | POA: Diagnosis not present

## 2024-03-28 DIAGNOSIS — E782 Mixed hyperlipidemia: Secondary | ICD-10-CM | POA: Diagnosis not present

## 2024-03-28 DIAGNOSIS — I5043 Acute on chronic combined systolic (congestive) and diastolic (congestive) heart failure: Secondary | ICD-10-CM | POA: Diagnosis not present

## 2024-03-28 DIAGNOSIS — K259 Gastric ulcer, unspecified as acute or chronic, without hemorrhage or perforation: Secondary | ICD-10-CM

## 2024-03-28 DIAGNOSIS — R7989 Other specified abnormal findings of blood chemistry: Secondary | ICD-10-CM

## 2024-03-28 DIAGNOSIS — M8580 Other specified disorders of bone density and structure, unspecified site: Secondary | ICD-10-CM | POA: Diagnosis present

## 2024-03-28 DIAGNOSIS — F17210 Nicotine dependence, cigarettes, uncomplicated: Secondary | ICD-10-CM | POA: Diagnosis not present

## 2024-03-28 DIAGNOSIS — R9431 Abnormal electrocardiogram [ECG] [EKG]: Secondary | ICD-10-CM | POA: Diagnosis present

## 2024-03-28 DIAGNOSIS — K254 Chronic or unspecified gastric ulcer with hemorrhage: Secondary | ICD-10-CM | POA: Diagnosis present

## 2024-03-28 DIAGNOSIS — Z7409 Other reduced mobility: Secondary | ICD-10-CM | POA: Diagnosis present

## 2024-03-28 DIAGNOSIS — I130 Hypertensive heart and chronic kidney disease with heart failure and stage 1 through stage 4 chronic kidney disease, or unspecified chronic kidney disease: Secondary | ICD-10-CM | POA: Diagnosis not present

## 2024-03-28 DIAGNOSIS — N183 Chronic kidney disease, stage 3 unspecified: Secondary | ICD-10-CM | POA: Diagnosis present

## 2024-03-28 DIAGNOSIS — Z79899 Other long term (current) drug therapy: Secondary | ICD-10-CM

## 2024-03-28 DIAGNOSIS — Z888 Allergy status to other drugs, medicaments and biological substances status: Secondary | ICD-10-CM

## 2024-03-28 DIAGNOSIS — Z66 Do not resuscitate: Secondary | ICD-10-CM | POA: Diagnosis present

## 2024-03-28 DIAGNOSIS — K922 Gastrointestinal hemorrhage, unspecified: Secondary | ICD-10-CM

## 2024-03-28 DIAGNOSIS — I872 Venous insufficiency (chronic) (peripheral): Secondary | ICD-10-CM | POA: Diagnosis not present

## 2024-03-28 DIAGNOSIS — I5031 Acute diastolic (congestive) heart failure: Secondary | ICD-10-CM | POA: Diagnosis not present

## 2024-03-28 DIAGNOSIS — Z7901 Long term (current) use of anticoagulants: Secondary | ICD-10-CM | POA: Diagnosis not present

## 2024-03-28 DIAGNOSIS — E66813 Obesity, class 3: Secondary | ICD-10-CM | POA: Diagnosis not present

## 2024-03-28 DIAGNOSIS — Z7401 Bed confinement status: Secondary | ICD-10-CM

## 2024-03-28 DIAGNOSIS — E1169 Type 2 diabetes mellitus with other specified complication: Secondary | ICD-10-CM | POA: Diagnosis not present

## 2024-03-28 DIAGNOSIS — E7800 Pure hypercholesterolemia, unspecified: Secondary | ICD-10-CM

## 2024-03-28 DIAGNOSIS — Z8616 Personal history of COVID-19: Secondary | ICD-10-CM | POA: Diagnosis not present

## 2024-03-28 DIAGNOSIS — R5381 Other malaise: Secondary | ICD-10-CM | POA: Diagnosis present

## 2024-03-28 DIAGNOSIS — D631 Anemia in chronic kidney disease: Secondary | ICD-10-CM | POA: Diagnosis present

## 2024-03-28 DIAGNOSIS — G8929 Other chronic pain: Secondary | ICD-10-CM | POA: Diagnosis present

## 2024-03-28 DIAGNOSIS — I447 Left bundle-branch block, unspecified: Secondary | ICD-10-CM | POA: Diagnosis present

## 2024-03-28 DIAGNOSIS — M109 Gout, unspecified: Secondary | ICD-10-CM | POA: Diagnosis present

## 2024-03-28 DIAGNOSIS — N1832 Chronic kidney disease, stage 3b: Secondary | ICD-10-CM | POA: Diagnosis not present

## 2024-03-28 DIAGNOSIS — K5710 Diverticulosis of small intestine without perforation or abscess without bleeding: Secondary | ICD-10-CM | POA: Diagnosis not present

## 2024-03-28 DIAGNOSIS — J9621 Acute and chronic respiratory failure with hypoxia: Secondary | ICD-10-CM | POA: Diagnosis present

## 2024-03-28 DIAGNOSIS — N179 Acute kidney failure, unspecified: Secondary | ICD-10-CM | POA: Diagnosis not present

## 2024-03-28 DIAGNOSIS — Z8249 Family history of ischemic heart disease and other diseases of the circulatory system: Secondary | ICD-10-CM

## 2024-03-28 DIAGNOSIS — R600 Localized edema: Secondary | ICD-10-CM | POA: Diagnosis not present

## 2024-03-28 DIAGNOSIS — I739 Peripheral vascular disease, unspecified: Secondary | ICD-10-CM

## 2024-03-28 DIAGNOSIS — Z86711 Personal history of pulmonary embolism: Secondary | ICD-10-CM | POA: Diagnosis present

## 2024-03-28 DIAGNOSIS — I5033 Acute on chronic diastolic (congestive) heart failure: Secondary | ICD-10-CM | POA: Diagnosis not present

## 2024-03-28 DIAGNOSIS — Z7984 Long term (current) use of oral hypoglycemic drugs: Secondary | ICD-10-CM

## 2024-03-28 DIAGNOSIS — I700 Atherosclerosis of aorta: Secondary | ICD-10-CM | POA: Diagnosis present

## 2024-03-28 DIAGNOSIS — Z9049 Acquired absence of other specified parts of digestive tract: Secondary | ICD-10-CM

## 2024-03-28 DIAGNOSIS — I252 Old myocardial infarction: Secondary | ICD-10-CM

## 2024-03-28 DIAGNOSIS — I10 Essential (primary) hypertension: Secondary | ICD-10-CM | POA: Diagnosis present

## 2024-03-28 DIAGNOSIS — I480 Paroxysmal atrial fibrillation: Secondary | ICD-10-CM | POA: Diagnosis present

## 2024-03-28 DIAGNOSIS — D62 Acute posthemorrhagic anemia: Secondary | ICD-10-CM | POA: Diagnosis not present

## 2024-03-28 DIAGNOSIS — I509 Heart failure, unspecified: Secondary | ICD-10-CM | POA: Diagnosis not present

## 2024-03-28 DIAGNOSIS — Z860101 Personal history of adenomatous and serrated colon polyps: Secondary | ICD-10-CM

## 2024-03-28 DIAGNOSIS — N184 Chronic kidney disease, stage 4 (severe): Secondary | ICD-10-CM | POA: Diagnosis not present

## 2024-03-28 DIAGNOSIS — Z6841 Body Mass Index (BMI) 40.0 and over, adult: Secondary | ICD-10-CM | POA: Diagnosis not present

## 2024-03-28 DIAGNOSIS — T501X5A Adverse effect of loop [high-ceiling] diuretics, initial encounter: Secondary | ICD-10-CM | POA: Diagnosis present

## 2024-03-28 DIAGNOSIS — J9601 Acute respiratory failure with hypoxia: Secondary | ICD-10-CM | POA: Diagnosis present

## 2024-03-28 DIAGNOSIS — I493 Ventricular premature depolarization: Secondary | ICD-10-CM | POA: Diagnosis present

## 2024-03-28 DIAGNOSIS — R531 Weakness: Secondary | ICD-10-CM

## 2024-03-28 DIAGNOSIS — K760 Fatty (change of) liver, not elsewhere classified: Secondary | ICD-10-CM | POA: Diagnosis present

## 2024-03-28 DIAGNOSIS — Z9181 History of falling: Secondary | ICD-10-CM

## 2024-03-28 DIAGNOSIS — K921 Melena: Secondary | ICD-10-CM | POA: Diagnosis not present

## 2024-03-28 DIAGNOSIS — M1711 Unilateral primary osteoarthritis, right knee: Secondary | ICD-10-CM | POA: Diagnosis present

## 2024-03-28 DIAGNOSIS — I5021 Acute systolic (congestive) heart failure: Secondary | ICD-10-CM | POA: Diagnosis not present

## 2024-03-28 DIAGNOSIS — I2510 Atherosclerotic heart disease of native coronary artery without angina pectoris: Secondary | ICD-10-CM | POA: Diagnosis not present

## 2024-03-28 DIAGNOSIS — I3481 Nonrheumatic mitral (valve) annulus calcification: Secondary | ICD-10-CM | POA: Diagnosis not present

## 2024-03-28 DIAGNOSIS — J9811 Atelectasis: Secondary | ICD-10-CM | POA: Diagnosis not present

## 2024-03-28 DIAGNOSIS — E119 Type 2 diabetes mellitus without complications: Secondary | ICD-10-CM

## 2024-03-28 DIAGNOSIS — I2489 Other forms of acute ischemic heart disease: Secondary | ICD-10-CM | POA: Diagnosis present

## 2024-03-28 DIAGNOSIS — Z1152 Encounter for screening for COVID-19: Secondary | ICD-10-CM

## 2024-03-28 DIAGNOSIS — R262 Difficulty in walking, not elsewhere classified: Secondary | ICD-10-CM

## 2024-03-28 DIAGNOSIS — R0902 Hypoxemia: Secondary | ICD-10-CM

## 2024-03-28 DIAGNOSIS — R0989 Other specified symptoms and signs involving the circulatory and respiratory systems: Secondary | ICD-10-CM | POA: Diagnosis not present

## 2024-03-28 DIAGNOSIS — Z881 Allergy status to other antibiotic agents status: Secondary | ICD-10-CM

## 2024-03-28 DIAGNOSIS — R9389 Abnormal findings on diagnostic imaging of other specified body structures: Secondary | ICD-10-CM | POA: Diagnosis not present

## 2024-03-28 DIAGNOSIS — K219 Gastro-esophageal reflux disease without esophagitis: Secondary | ICD-10-CM | POA: Diagnosis present

## 2024-03-28 DIAGNOSIS — K269 Duodenal ulcer, unspecified as acute or chronic, without hemorrhage or perforation: Secondary | ICD-10-CM | POA: Diagnosis not present

## 2024-03-28 DIAGNOSIS — M7989 Other specified soft tissue disorders: Secondary | ICD-10-CM | POA: Diagnosis not present

## 2024-03-28 DIAGNOSIS — E1122 Type 2 diabetes mellitus with diabetic chronic kidney disease: Secondary | ICD-10-CM | POA: Diagnosis not present

## 2024-03-28 DIAGNOSIS — Z90710 Acquired absence of both cervix and uterus: Secondary | ICD-10-CM

## 2024-03-28 HISTORY — DX: Paroxysmal atrial fibrillation: I48.0

## 2024-03-28 HISTORY — DX: Chronic systolic (congestive) heart failure: I50.22

## 2024-03-28 LAB — BASIC METABOLIC PANEL WITH GFR
Anion gap: 13 (ref 5–15)
BUN: 43 mg/dL — ABNORMAL HIGH (ref 8–23)
CO2: 25 mmol/L (ref 22–32)
Calcium: 9.1 mg/dL (ref 8.9–10.3)
Chloride: 103 mmol/L (ref 98–111)
Creatinine, Ser: 1.62 mg/dL — ABNORMAL HIGH (ref 0.44–1.00)
GFR, Estimated: 30 mL/min — ABNORMAL LOW (ref >60)
Glucose, Bld: 107 mg/dL — ABNORMAL HIGH (ref 70–99)
Potassium: 4.2 mmol/L (ref 3.5–5.1)
Sodium: 141 mmol/L (ref 135–145)

## 2024-03-28 LAB — CBC WITH DIFFERENTIAL/PLATELET
Abs Immature Granulocytes: 0.03 K/uL (ref 0.00–0.07)
Basophils Absolute: 0.1 K/uL (ref 0.0–0.1)
Basophils Relative: 1 %
Eosinophils Absolute: 0.1 K/uL (ref 0.0–0.5)
Eosinophils Relative: 1 %
HCT: 43.9 % (ref 36.0–46.0)
Hemoglobin: 13.6 g/dL (ref 12.0–15.0)
Immature Granulocytes: 0 %
Lymphocytes Relative: 11 %
Lymphs Abs: 1.1 K/uL (ref 0.7–4.0)
MCH: 29.6 pg (ref 26.0–34.0)
MCHC: 31 g/dL (ref 30.0–36.0)
MCV: 95.6 fL (ref 80.0–100.0)
Monocytes Absolute: 0.7 K/uL (ref 0.1–1.0)
Monocytes Relative: 7 %
Neutro Abs: 7.8 K/uL — ABNORMAL HIGH (ref 1.7–7.7)
Neutrophils Relative %: 80 %
Platelets: 233 K/uL (ref 150–400)
RBC: 4.59 MIL/uL (ref 3.87–5.11)
RDW: 14.8 % (ref 11.5–15.5)
WBC: 9.7 K/uL (ref 4.0–10.5)
nRBC: 0 % (ref 0.0–0.2)

## 2024-03-28 LAB — URINALYSIS, ROUTINE W REFLEX MICROSCOPIC
Bacteria, UA: NONE SEEN
Bilirubin Urine: NEGATIVE
Glucose, UA: >=500 mg/dL — AB
Hgb urine dipstick: NEGATIVE
Ketones, ur: 5 mg/dL — AB
Leukocytes,Ua: NEGATIVE
Nitrite: NEGATIVE
Protein, ur: 100 mg/dL — AB
Specific Gravity, Urine: 1.016 (ref 1.005–1.030)
pH: 5 (ref 5.0–8.0)

## 2024-03-28 LAB — BLOOD GAS, VENOUS
Acid-Base Excess: 9.1 mmol/L — ABNORMAL HIGH (ref 0.0–2.0)
Bicarbonate: 35.2 mmol/L — ABNORMAL HIGH (ref 20.0–28.0)
O2 Saturation: 56.1 %
Patient temperature: 37
pCO2, Ven: 53 mmHg (ref 44–60)
pH, Ven: 7.43 (ref 7.25–7.43)
pO2, Ven: <31 mmHg — CL (ref 32–45)

## 2024-03-28 LAB — MAGNESIUM: Magnesium: 2.3 mg/dL (ref 1.7–2.4)

## 2024-03-28 LAB — I-STAT CG4 LACTIC ACID, ED: Lactic Acid, Venous: 1.2 mmol/L (ref 0.5–1.9)

## 2024-03-28 LAB — PRO BRAIN NATRIURETIC PEPTIDE: Pro Brain Natriuretic Peptide: 698 pg/mL — ABNORMAL HIGH (ref <300.0)

## 2024-03-28 MED ORDER — TRAZODONE HCL 100 MG PO TABS
50.0000 mg | ORAL_TABLET | Freq: Every day | ORAL | Status: DC
  Administered 2024-03-28: 50 mg via ORAL
  Filled 2024-03-28: qty 1

## 2024-03-28 MED ORDER — INSULIN ASPART 100 UNIT/ML IJ SOLN
0.0000 [IU] | INTRAMUSCULAR | Status: DC
  Administered 2024-03-30: 1 [IU] via SUBCUTANEOUS
  Administered 2024-03-30 – 2024-03-31 (×2): 2 [IU] via SUBCUTANEOUS
  Administered 2024-03-31: 3 [IU] via SUBCUTANEOUS
  Administered 2024-03-31: 1 [IU] via SUBCUTANEOUS
  Administered 2024-03-31 – 2024-04-01 (×3): 2 [IU] via SUBCUTANEOUS
  Administered 2024-04-01: 1 [IU] via SUBCUTANEOUS
  Filled 2024-03-28: qty 0.09

## 2024-03-28 MED ORDER — ACETAMINOPHEN 500 MG PO TABS
1000.0000 mg | ORAL_TABLET | Freq: Once | ORAL | Status: AC
  Administered 2024-03-28: 1000 mg via ORAL
  Filled 2024-03-28: qty 2

## 2024-03-28 MED ORDER — FUROSEMIDE 10 MG/ML IJ SOLN
40.0000 mg | Freq: Once | INTRAMUSCULAR | Status: AC
  Administered 2024-03-28: 40 mg via INTRAVENOUS
  Filled 2024-03-28: qty 4

## 2024-03-28 MED ORDER — FENTANYL CITRATE PF 50 MCG/ML IJ SOSY
25.0000 ug | PREFILLED_SYRINGE | INTRAMUSCULAR | Status: DC | PRN
  Administered 2024-03-30 – 2024-03-31 (×2): 25 ug via INTRAVENOUS
  Filled 2024-03-28 (×4): qty 1

## 2024-03-28 MED ORDER — ALPRAZOLAM 0.25 MG PO TABS
0.2500 mg | ORAL_TABLET | Freq: Once | ORAL | Status: AC
  Administered 2024-03-28: 0.25 mg via ORAL
  Filled 2024-03-28: qty 1

## 2024-03-28 NOTE — ED Provider Notes (Signed)
 [place] [facility] DEPARTMENT AT [facility] Provider Note   CSN: [id number] Arrival date & time: 03/28/24  1208     Patient presents with: Fall and Impaired Mobility    Pamela [name] is a 89 y.o. female.   HPI Patient reports that she had a fall 5 weeks ago.  She reports since then she has had a lot of difficulty getting up and moving around.  She has basically been confined to her recliner chair.  She reports typically she was up walking pretty comfortably with a walker before the fall.  Since then she uses her walker to help transfer with her husband's assistance but mostly has been pretty immobile.  He brings a potty chair to her recliner and helps her transfer back and forth.  She also reports that she has not been able to shower and so he has been basically helping her bathe.  Patient reports she is gotten increasingly swollen lower legs.  She reports that she has been taking Lasix  20 mg daily but continues to get increasingly swollen and red in the legs.  She was seen with a video evaluation and prescribed Keflex for possible cellulitis.  None of these interventions have been helpful.  Patient reports historically she would get swelling of the legs off-and-on and take Lasix  on an as needed basis but she has never had it be so persistent.  Reports as of today she was not even able to stand to help transfer.  Patient denies chest pain or shortness of breath.  However, she has not had any activity for a number of weeks.  No fever no productive cough.    Prior to Admission medications   Medication Sig Start Date End Date Taking? Authorizing Provider  acetaminophen  (TYLENOL  8 HOUR ARTHRITIS PAIN) 650 MG CR tablet Take 2 tablets by mouth in the morning and at bedtime.    [provider]  ALPRAZolam  (XANAX ) 0.25 MG tablet Take 0.125-0.25 mg by mouth 2 (two) times daily as needed. 01/13/22   [provider]  amLODipine (NORVASC) 2.5 MG tablet Take 2.5 mg by mouth  daily. 02/01/22   [provider]  apixaban  (ELIQUIS ) 5 MG TABS tablet Take 1 tablet (5 mg total) by mouth 2 (two) times daily. 02/16/22   Court Dorn PARAS, MD  calcium  carbonate (OSCAL) 1500 (600 Ca) MG TABS tablet Take 1,500 mg by mouth 2 (two) times daily with a meal.    [provider]  dapagliflozin  propanediol (FARXIGA ) 10 MG TABS tablet Take 10 mg by mouth daily.    [provider]  diclofenac  Sodium (VOLTAREN ) 1 % GEL Apply 2 g topically 4 (four) times daily. 01/03/22   Sherrill Cable Latif, DO  furosemide  (LASIX ) 20 MG tablet Take 1 tablet (20 mg total) by mouth daily as needed for edema (or weight gain). 05/06/21 05/06/22  Fausto Sor A, DO  K Phos Mono-Sod Phos Di & Mono (PHOSPHA 250 NEUTRAL) [id number] MG TABS Take 2 tablets by mouth daily. 01/31/22   [provider]  Lancets [facility] DELICA PLUS [place]) MISC  07/09/21   [provider]  lovastatin (MEVACOR) 20 MG tablet Take 20 mg by mouth at bedtime.    [provider]  OVER THE COUNTER MEDICATION Take 1 capsule by mouth daily. Daily fiber 100% psyllium. Patient not taking: Reported on 02/16/2022    [provider]  Probiotic Product (ALIGN) 4 MG CAPS Take 4 mg by mouth daily.    [provider]  traZODone  (DESYREL ) 50 MG tablet Take 50 mg by mouth at bedtime as needed for sleep.    [provider]    Allergies: Levaquin [levofloxacin in d5w] and Lisinopril    Review of Systems  Updated Vital Signs BP (!) 141/88 (BP Location: Left Arm)   Pulse (!) 105   Temp 97.8 F (36.6 C) (Oral)   Resp 18   SpO2 100%   Physical Exam Constitutional:      Comments: Patient is alert.  No acute distress.  Morbid obesity.  HENT:     Mouth/Throat:     Pharynx: Oropharynx is clear.  Eyes:     Extraocular Movements: Extraocular movements intact.  Cardiovascular:     Rate and Rhythm: Normal rate and regular rhythm.  Pulmonary:     Comments: Lungs grossly clear  with fine basilar crackles. Abdominal:     Comments: Abdomen nontender.  There is some edema present of the lower abdominal pannus.  Musculoskeletal:     Comments: Bilateral lower extremities have edema up to the thighs.  Patient does have erythema that is fairly symmetric lower tibial legs bilaterally.  Skin:    General: Skin is warm and dry.  Neurological:     General: No focal deficit present.     Comments: Focal motor deficits.  Patient is alert and oriented.  Mental status clear.  Speech is normal.  She has symmetric function of extremities.  She is mostly limited by body habitus.  Psychiatric:        Mood and Affect: Mood normal.     (all labs ordered are listed, but only abnormal results are displayed) Labs Reviewed - No data to display  EKG: EKG Interpretation Date/Time:  Thursday March 28 2024 16:08:49 EDT Ventricular Rate:  101 PR Interval:  136 QRS Duration:  146 QT Interval:  396 QTC Calculation: 513 R Axis:   -42  Text Interpretation: Sinus tachycardia with Fusion complexes Left axis deviation Left bundle branch block Abnormal ECG When compared with ECG of 01-Jan-2022 08:52, PREVIOUS ECG IS PRESENT rate incresed, otherwise no sig change Confirmed by Armenta Canning [phone]) on 03/28/2024 4:13:39 PM  Radiology: No results found.   Procedures   Medications Ordered in the ED - No data to display                                  Medical Decision Making Amount and/or Complexity of Data Reviewed Labs: ordered. Radiology: ordered.  Risk Prescription drug management.   Patient presents as outlined.  She has had increasing lower extremity edema.  On examination she does have some faint crackles.  She is not symptomatically short of breath.  Differential diagnosis includes congestive heart failure, significant venous stasis, cellulitis.  Will proceed with diagnostic evaluation.  Based on clinical exam,  will administer Lasix  40 mg IV.  Portable chest x-ray  interpreted by myself pending radiology review, cardiomegaly, mild vascular congestion.  Lactic acid 1.2.  Labs pending.  Anticipate patient will require admission for volume overload with probable element of CHF and significant lower extremity edema impeding ADLs.  Dr. Donivan to follow-up on lab work and make final disposition.     Final diagnoses:  Bilateral lower extremity edema  General weakness  Ambulatory dysfunction    ED Discharge Orders     None          Armenta Canning, MD 03/28/24 [phone]

## 2024-03-28 NOTE — ED Provider Notes (Signed)
 89 yo female w/ obesity here with a fall a few weeks ago, has developed lower extremity edema but has been immobile in a recliner for most of the time   Pt has been on lasix  at home, still having swelling of her legs  Video medicine conference started treatment for cellulitis yesterday, but this may be more consistent with venous stasis dermatitis   Patient desating to 80's on room air with ambulation - stable on 4L [place].  Physical Exam  BP (!) 151/83 (BP Location: Left Wrist)   Pulse (!) 104   Temp 97.7 F (36.5 C) (Oral)   Resp 18   SpO2 97%   Physical Exam  Procedures  Procedures  ED Course / MDM   Clinical Course as of 03/28/24 2218  Thu Mar 28, 2024  1936 Initial labs were hemolyzed and we have been awaiting the repeat levels. [MT]  2036 Repeat CMP also hemolyzed .... [MT]  2218 3rd draw now sent to lab for BMP.  Home evening meds ordered for patient. I've explained to her and her husband plan for admission for CHF with hypoxia [MT]    Clinical Course User Index [MT] Claudell Rhody, Donnice PARAS, MD   Medical Decision Making Amount and/or Complexity of Data Reviewed Labs: ordered. Radiology: ordered.  Risk OTC drugs. Prescription drug management. Decision regarding hospitalization.   11 pm admitted to hospitalist      Cottie Donnice PARAS, MD 03/28/24 2256

## 2024-03-28 NOTE — ED Triage Notes (Signed)
 Pt BIB ems for a fall that was 5 weeks ago, but since then has had leg swelling and redness, but pain in the legs. Her PCP put her on lasix  which helped for a while but now don't work anymore. She is unable to get and walk. Hx. CHF, DM. A&Ox4.

## 2024-03-28 NOTE — H&P (Signed)
 Pamela [name] FMW:[id number] DOB: [birth year] DOA: 03/28/2024     PCP: [name], MD   Outpatient Specialists:   CARDS:  Dr.  Court  NEphrology: *  Dr. No care team member to display  NEurology   Dr. Tobie Pulmonary *  Dr.  Oncology * Dr.No care team member to display  GI* Dr.  Gwen, LB) Aneita Gwendlyn DASEN, MD (Inactive) Urology Dr. *  Patient arrived to ER on 03/28/24 at 1208 Referred by Attending Cottie Donnice PARAS, MD   Patient coming from:    home Lives With family     Chief Complaint:   Chief Complaint  Patient presents with   Fall   Impaired Mobility     HPI: Pamela [name] is a 89 y.o. female with medical history significant of  Dm2, CKD stage IV anemia of chronic disease, history of A-fib and PE on Eliquis     Presented with worsening leg edema and inability to ambulate Had a fall 5 weeks ago had some leg swelling and redness  Worsening shortness of breath and weakness cannot get out of her recliner Usually on lasix  was changed to torsemide and it not working for her   She is at baseline 4 L  CXR showed CHF  Has stasis dermatitis  Has been treated for cellulitis in the past but bilateral  No CP no fever no cough    Denies significant ETOH intake *** Does not smoke*** but interested in quitting***  Denies marijuana use ***    Regarding pertinent Chronic problems:    Hyperlipidemia - on statins   Mevacor   HTN on Norvasc  ***chronic CHF diastolic/systolic/ combined - last echo*** Recent Results (from the past 56199 hours)  ECHOCARDIOGRAM COMPLETE   Collection Time: 12/29/21  9:52 AM  Result Value   Weight 4,303.38   Height 62   BP 122/63   S' Lateral 2.70   AR max vel 2.53   AV Area VTI 2.30   AV Mean grad 5.0   AV Peak grad 8.8   Ao pk vel 1.48   Area-P 1/2 2.57   AV Area mean vel 2.32   Narrative      ECHOCARDIOGRAM REPORT         1. Left ventricular ejection fraction, by estimation, is 45 to 50%. The left ventricle has mildly  decreased function. The left ventricle demonstrates global hypokinesis. There is mild left ventricular hypertrophy. Left ventricular diastolic parameters  are consistent with Grade II diastolic dysfunction (pseudonormalization).  2. Right ventricular systolic function is normal. The right ventricular size is normal.  3. The mitral valve is normal in structure. No evidence of mitral valve regurgitation. No evidence of mitral stenosis. Moderate mitral annular calcification.  4. The aortic valve is normal in structure. Aortic valve regurgitation is not visualized. No aortic stenosis is present.  5. The inferior vena cava is normal in size with greater than 50% respiratory variability, suggesting right atrial pressure of 3 mmHg.             DM 2 -  Lab Results  Component Value Date   HGBA1C 5.7 (H) 12/29/2021    diet controlled      Morbid obesity-   BMI Readings from Last 1 Encounters:  02/23/23 47.50 kg/m     *** Asthma -well *** controlled on home inhalers/ nebs                     *** COPD -  not **followed by pulmonology *** not  on baseline oxygen   *L,    *** OSA -on nocturnal oxygen , *CPAP, *noncompliant with CPAP    A. Fib -   atrial fibrillation CHA2DS2 vas score  9      current  on anticoagulation with  Eliquis ,    ***Hx of DVT/PE on - anticoagulation with Eliquis ,     CKD stage IIIb  baseline Cr 1.3 CrCl cannot be calculated (Unknown ideal weight.).  Lab Results  Component Value Date   CREATININE 1.62 (H) 03/28/2024   CREATININE 1.26 (H) 02/23/2023   CREATININE 1.46 (H) 01/03/2022   Lab Results  Component Value Date   NA 141 03/28/2024   CL 103 03/28/2024   K 4.2 03/28/2024   CO2 25 03/28/2024   BUN 43 (H) 03/28/2024   CREATININE 1.62 (H) 03/28/2024   GFRNONAA 30 (L) 03/28/2024   CALCIUM  9.1 03/28/2024   PHOS 3.4 01/03/2022   ALBUMIN  3.8 02/23/2023   GLUCOSE 107 (H) 03/28/2024     Seizure DO - las seizure *** currently on       While in  ER: Clinical Course as of 03/28/24 2256  Thu Mar 28, 2024  1936 Initial labs were hemolyzed and we have been awaiting the repeat levels. [MT]  2036 Repeat CMP also hemolyzed .... [MT]  2218 3rd draw now sent to lab for BMP.  Home evening meds ordered for patient. I've explained to her and her husband plan for admission for CHF with hypoxia [MT]    Clinical Course User Index [MT] Trifan, Donnice PARAS, MD         Lab Orders         CBC with Differential         Urinalysis, Routine w reflex microscopic -Urine, Clean Catch         Blood gas, venous         Magnesium         Pro Brain natriuretic peptide         Basic metabolic panel         TSH        CXR - Cardiac enlargement with pulmonary vascular congestion    Following Medications were ordered in ER: Medications  traZODone  (DESYREL ) tablet 50 mg (50 mg Oral Given 03/28/24 2154)  furosemide  (LASIX ) injection 40 mg (40 mg Intravenous Given 03/28/24 1740)  ALPRAZolam  (XANAX ) tablet 0.25 mg (0.25 mg Oral Given 03/28/24 2154)  acetaminophen  (TYLENOL ) tablet 1,000 mg (1,000 mg Oral Given 03/28/24 2154)       ED Triage Vitals  Encounter Vitals Group     BP 03/28/24 1225 (!) 141/88     Girls Systolic BP Percentile --      Girls Diastolic BP Percentile --      Boys Systolic BP Percentile --      Boys Diastolic BP Percentile --      Pulse Rate 03/28/24 1225 (!) 105     Resp 03/28/24 1225 18     Temp 03/28/24 1225 97.8 F (36.6 C)     Temp Source 03/28/24 1225 Oral     SpO2 03/28/24 1225 97 %     Weight --      Height --      Head Circumference --      Peak Flow --      Pain Score 03/28/24 1233 0     Pain Loc --      Pain Education --  Exclude from Growth Chart --   UFJK(75)@     _________________________________________ Significant initial  Findings: Abnormal Labs Reviewed  CBC WITH DIFFERENTIAL/PLATELET - Abnormal; Notable for the following components:      Result Value   Neutro Abs 7.8 (*)    All other  components within normal limits  URINALYSIS, ROUTINE W REFLEX MICROSCOPIC - Abnormal; Notable for the following components:   APPearance HAZY (*)    Glucose, UA >=500 (*)    Ketones, ur 5 (*)    Protein, ur 100 (*)    All other components within normal limits  BLOOD GAS, VENOUS - Abnormal; Notable for the following components:   pO2, Ven <31 (*)    Bicarbonate 35.2 (*)    Acid-Base Excess 9.1 (*)    All other components within normal limits  PRO BRAIN NATRIURETIC PEPTIDE - Abnormal; Notable for the following components:   Pro Brain Natriuretic Peptide 698.0 (*)    All other components within normal limits  BASIC METABOLIC PANEL WITH GFR - Abnormal; Notable for the following components:   Glucose, Bld 107 (*)    BUN 43 (*)    Creatinine, Ser 1.62 (*)    GFR, Estimated 30 (*)    All other components within normal limits      _________________________ Troponin ***ordered Cardiac Panel (last 3 results) No results for input(s): CKTOTAL, CKMB, TROPONINIHS, RELINDX in the last 72 hours.   ECG: Ordered Personally reviewed and interpreted by me showing: HR : *** Rhythm: *NSR, Sinus tachycardia * A.fib. W RVR, RBBB, LBBB, Paced Ischemic changes*nonspecific changes, no evidence of ischemic changes QTC*  BNP (last 3 results) No results for input(s): BNP in the last 8760 hours.    No results for input(s): DDIMER, FERRITIN, LDH, CRP in the last 72 hours.    ____________________ This patient meets SIRS Criteria and may be septic. SIRS = Systemic Inflammatory Response Syndrome  Order a lactic acid level if needed AND/OR Initiate the sepsis protocol with the attached order set OR Click Treating Associated Infection or Illness if the patient is being treated for an infection that is a known cause of these abnormalities     The recent clinical data is shown below. Vitals:   03/28/24 1232 03/28/24 1233 03/28/24 1624 03/28/24 1949  BP:   (!) 151/83 133/89  Pulse:    (!) 104 (!) 101  Resp:   18 19  Temp:   97.7 F (36.5 C) 97.6 F (36.4 C)  TempSrc:   Oral Oral  SpO2: 100% 100% 97% 95%        WBC     Component Value Date/Time   WBC 9.7 03/28/2024 1602   LYMPHSABS 1.1 03/28/2024 1602   MONOABS 0.7 03/28/2024 1602   EOSABS 0.1 03/28/2024 1602   BASOSABS 0.1 03/28/2024 1602        Lactic Acid, Venous    Component Value Date/Time   LATICACIDVEN 1.2 03/28/2024 1618     Lactic Acid, Venous    Component Value Date/Time   LATICACIDVEN 1.2 03/28/2024 1618    Procalcitonin *** Ordered      UA *** no evidence of UTI  ***Pending ***not ordered   Urine analysis:    Component Value Date/Time   COLORURINE YELLOW 03/28/2024 1531   APPEARANCEUR HAZY (A) 03/28/2024 1531   LABSPEC 1.016 03/28/2024 1531   PHURINE 5.0 03/28/2024 1531   GLUCOSEU >=500 (A) 03/28/2024 1531   HGBUR NEGATIVE 03/28/2024 1531   BILIRUBINUR NEGATIVE 03/28/2024 1531  KETONESUR 5 (A) 03/28/2024 1531   PROTEINUR 100 (A) 03/28/2024 1531   NITRITE NEGATIVE 03/28/2024 1531   LEUKOCYTESUR NEGATIVE 03/28/2024 1531    Results for orders placed or performed during the hospital encounter of 12/28/21  Blood culture (routine x 2)     Status: None   Collection Time: 12/28/21 11:50 AM   Specimen: BLOOD RIGHT FOREARM  Result Value Ref Range Status   Specimen Description   Final    BLOOD RIGHT FOREARM Performed at [facility], [address] [place], [place] [address]    Special Requests   Final    BOTTLES DRAWN AEROBIC AND ANAEROBIC Blood Culture adequate volume Performed at [facility], [address], [place], [place] [address]    Culture   Final    NO GROWTH 5 DAYS Performed at [facility], [address], [place], [place] [address]    Report Status 01/02/2022 FINAL  Final  SARS Coronavirus 2 by RT PCR (hospital order, performed in [facility] hospital lab) *cepheid single result test* Anterior Nasal Swab      Status: None   Collection Time: 12/28/21 11:51 AM   Specimen: Anterior Nasal Swab  Result Value Ref Range Status   SARS Coronavirus 2 by RT PCR NEGATIVE NEGATIVE Final    Comment: (NOTE) SARS-CoV-2 target nucleic acids are NOT DETECTED.  The SARS-CoV-2 RNA is generally detectable in upper and lower respiratory specimens during the acute phase of infection. The lowest concentration of SARS-CoV-2 viral copies this assay can detect is 250 copies / mL. A negative result does not preclude SARS-CoV-2 infection and should not be used as the sole basis for treatment or other patient management decisions.  A negative result may occur with improper specimen collection / handling, submission of specimen other than nasopharyngeal swab, presence of viral mutation(s) within the areas targeted by this assay, and inadequate number of viral copies (<250 copies / mL). A negative result must be combined with clinical observations, patient history, and epidemiological information.  Fact Sheet for Patients:   RoadLapTop.co.za  Fact Sheet for Healthcare Providers: http://kim-miller.com/  This test is not yet approved or  cleared by the [place]  FDA and has been authorized for detection and/or diagnosis of SARS-CoV-2 by FDA under an Emergency Use Authorization (EUA).  This EUA will remain in effect (meaning this test can be used) for the duration of the COVID-19 declaration under Section 564(b)(1) of the Act, 21 U.S.C. section 360bbb-3(b)(1), unless the authorization is terminated or revoked sooner.  Performed at [facility], [address], [place], [place] [address]   Blood culture (routine x 2)     Status: None   Collection Time: 12/28/21  7:22 PM   Specimen: BLOOD  Result Value Ref Range Status   Specimen Description   Final    BLOOD BLOOD LEFT HAND Performed at [facility], [address]  [place], [place] [address]    Special Requests   Final    IN PEDIATRIC BOTTLE Blood Culture adequate volume Performed at [facility], [address] [place], [place] [address]    Culture   Final    NO GROWTH 5 DAYS Performed at [facility], [address], [place], [place] [address]    Report Status 01/02/2022 FINAL  Final    ABX started Antibiotics Given (last 72 hours)     None        No results found for the last  90 days.    ________________________________________________________________  Arterial ***Venous  Blood Gas result:  pH *** pCO2 ***; pO2 ***;     %O2 Sat ***.  ABG    Component Value Date/Time   HCO3 35.2 (H) 03/28/2024 1602   TCO2 25 04/29/2017 0702   O2SAT 56.1 03/28/2024 1602       __________________________________________________________ Recent Labs  Lab 03/28/24 1753 03/28/24 2140  NA  --  141  K  --  4.2  CO2  --  25  GLUCOSE  --  107*  BUN  --  43*  CREATININE  --  1.62*  CALCIUM   --  9.1  MG 2.3  --     Cr  * stable,  Up from baseline see below Lab Results  Component Value Date   CREATININE 1.62 (H) 03/28/2024   CREATININE 1.26 (H) 02/23/2023   CREATININE 1.46 (H) 01/03/2022    No results for input(s): AST, ALT, ALKPHOS, BILITOT, PROT, ALBUMIN  in the last 168 hours. Lab Results  Component Value Date   CALCIUM  9.1 03/28/2024   PHOS 3.4 01/03/2022          Plt: Lab Results  Component Value Date   PLT 233 03/28/2024         Recent Labs  Lab 03/28/24 1602  WBC 9.7  NEUTROABS 7.8*  HGB 13.6  HCT 43.9  MCV 95.6  PLT 233    HG/HCT * stable,  Down *Up from baseline see below    Component Value Date/Time   HGB 13.6 03/28/2024 1602   HCT 43.9 03/28/2024 1602   MCV 95.6 03/28/2024 1602      No results for input(s): LIPASE, AMYLASE in the last 168 hours. No results for input(s): AMMONIA in the last 168 hours.     _______________________________________________ Hospitalist was called for admission for *** Bilateral lower extremity edema ***  General weakness ***  Ambulatory dysfunction ***  Hypoxia ***    The following Work up has been ordered so far:  Orders Placed This Encounter  Procedures   DG Chest Port 1 View   CBC with Differential   Urinalysis, Routine w reflex microscopic -Urine, Clean Catch   Blood gas, venous   Magnesium   Pro Brain natriuretic peptide   Basic metabolic panel   TSH   Consult to hospitalist   ED EKG   Saline lock IV     OTHER Significant initial  Findings:  labs showing:     DM  labs:  HbA1C: No results for input(s): HGBA1C in the last 8760 hours.     CBG (last 3)  No results for input(s): GLUCAP in the last 72 hours.        Cultures:    Component Value Date/Time   SDES  12/28/2021 1922    BLOOD BLOOD LEFT HAND Performed at [facility], [address], [place], [place] [address]    SPECREQUEST  12/28/2021 1922    IN PEDIATRIC BOTTLE Blood Culture adequate volume Performed at [facility], [address], [place], [place] [address]    CULT  12/28/2021 1922    NO GROWTH 5 DAYS Performed at [facility], [address] [place], [place] [address]    REPTSTATUS 01/02/2022 FINAL 12/28/2021 1922     Radiological Exams on Admission: DG Chest Port 1 View Result Date: 03/28/2024 CLINICAL DATA:  Lower extremity swelling.  Fell 5 weeks ago. EXAM: PORTABLE CHEST 1 VIEW COMPARISON:  12/28/2021 FINDINGS: Shallow  inspiration with atelectasis in the lung bases. Cardiac enlargement with mild vascular congestion. No obvious edema or consolidation. No pleural effusion or pneumothorax. Mediastinal contours appear intact. Calcified and tortuous aorta. Degenerative changes in the spine and shoulders. IMPRESSION: Shallow inspiration with atelectasis in the lung bases. Cardiac enlargement with pulmonary  vascular congestion. No edema. Electronically Signed   By: Elsie Gravely M.D.   On: 03/28/2024 17:17   _______________________________________________________________________________________________________ Latest  Blood pressure 133/89, pulse (!) 101, temperature 97.6 F (36.4 C), temperature source Oral, resp. rate 19, SpO2 95%.   Vitals  labs and radiology finding personally reviewed  Review of Systems:    Pertinent positives include: ***  Constitutional:  No weight loss, night sweats, Fevers, chills, fatigue, weight loss  HEENT:  No headaches, Difficulty swallowing,Tooth/dental problems,Sore throat,  No sneezing, itching, ear ache, nasal congestion, post nasal drip,  Cardio-vascular:  No chest pain, Orthopnea, PND, anasarca, dizziness, palpitations.no Bilateral lower extremity swelling  GI:  No heartburn, indigestion, abdominal pain, nausea, vomiting, diarrhea, change in bowel habits, loss of appetite, melena, blood in stool, hematemesis Resp:  no shortness of breath at rest. No dyspnea on exertion, No excess mucus, no productive cough, No non-productive cough, No coughing up of blood.No change in color of mucus.No wheezing. Skin:  no rash or lesions. No jaundice GU:  no dysuria, change in color of urine, no urgency or frequency. No straining to urinate.  No flank pain.  Musculoskeletal:  No joint pain or no joint swelling. No decreased range of motion. No back pain.  Psych:  No change in mood or affect. No depression or anxiety. No memory loss.  Neuro: no localizing neurological complaints, no tingling, no weakness, no double vision, no gait abnormality, no slurred speech, no confusion  All systems reviewed and apart from HOPI all are negative _______________________________________________________________________________________________ Past Medical History:   Past Medical History:  Diagnosis Date   Acute diastolic CHF (congestive heart failure) (HCC) 10/11/2019    Arthritis    Bilateral lower extremity edema    left lower extremity cellulitis   Common bile duct stone 06/22/2017   Diabetes (HCC)    Diverticulosis    Duodenal diverticulum, periampullary 11/03/2018   Aborted ERCP 2016   GERD (gastroesophageal reflux disease)    History of hiatal hernia    Hypertension    Kidney disease, chronic, stage III (GFR 30-59 ml/min) (HCC)    Obesity    Osteopenia    Seizures (HCC) 22 yrs ago   no cause found   Tubular adenoma of colon 2009      Past Surgical History:  Procedure Laterality Date   ABDOMINAL HYSTERECTOMY     APPENDECTOMY     CHOLECYSTECTOMY OPEN  1977   COLONOSCOPY WITH PROPOFOL  N/A 10/16/2014   Procedure: COLONOSCOPY WITH PROPOFOL ;  Surgeon: Gwendlyn ONEIDA Buddy, MD;  Location: [facility] ENDOSCOPY;  Service: Endoscopy;  Laterality: N/A;   ENDOSCOPIC RETROGRADE CHOLANGIOPANCREATOGRAPHY (ERCP) WITH PROPOFOL  N/A 06/02/2017   Procedure: ENDOSCOPIC RETROGRADE CHOLANGIOPANCREATOGRAPHY (ERCP) WITH PROPOFOL ;  Surgeon: Buddy Gwendlyn ONEIDA, MD;  Location: [facility] ENDOSCOPY;  Service: Endoscopy;  Laterality: N/A;   TONSILLECTOMY      Social History:  Ambulatory *** independently cane, walker  wheelchair bound, bed bound     reports that she has quit smoking. Her smoking use included cigarettes. She has a 20 pack-year smoking history. She has never used smokeless tobacco. She reports that she does not drink alcohol and does not use drugs.     Family History: *** Family  History  Problem Relation Age of Onset   Hypertension Brother    ______________________________________________________________________________________________ Allergies: Allergies  Allergen Reactions   Levaquin [Levofloxacin In D5w] Nausea And Vomiting   Lisinopril Cough     Prior to Admission medications   Medication Sig Start Date End Date Taking? Authorizing Provider  acetaminophen  (TYLENOL  8 HOUR ARTHRITIS PAIN) 650 MG CR tablet Take 2 tablets by mouth in the morning and at  bedtime.    [provider]  ALPRAZolam  (XANAX ) 0.25 MG tablet Take 0.125-0.25 mg by mouth 2 (two) times daily as needed. 01/13/22   [provider]  amLODipine (NORVASC) 2.5 MG tablet Take 2.5 mg by mouth daily. 02/01/22   [provider]  apixaban  (ELIQUIS ) 5 MG TABS tablet Take 1 tablet (5 mg total) by mouth 2 (two) times daily. 02/16/22   Court Dorn PARAS, MD  calcium  carbonate (OSCAL) 1500 (600 Ca) MG TABS tablet Take 1,500 mg by mouth 2 (two) times daily with a meal.    [provider]  dapagliflozin  propanediol (FARXIGA ) 10 MG TABS tablet Take 10 mg by mouth daily.    [provider]  diclofenac  Sodium (VOLTAREN ) 1 % GEL Apply 2 g topically 4 (four) times daily. 01/03/22   Sheikh, Omair Latif, DO  furosemide  (LASIX ) 20 MG tablet Take 1 tablet (20 mg total) by mouth daily as needed for edema (or weight gain). 05/06/21 05/06/22  Fausto Sor A, DO  K Phos Mono-Sod Phos Di & Mono (PHOSPHA 250 NEUTRAL) 155-852-130 MG TABS Take 2 tablets by mouth daily. 01/31/22   [provider]  Lancets [facility] DELICA PLUS [place]) MISC  07/09/21   [provider]  lovastatin (MEVACOR) 20 MG tablet Take 20 mg by mouth at bedtime.    [provider]  OVER THE COUNTER MEDICATION Take 1 capsule by mouth daily. Daily fiber 100% psyllium. Patient not taking: Reported on 02/16/2022    [provider]  Probiotic Product (ALIGN) 4 MG CAPS Take 4 mg by mouth daily.    [provider]  traZODone  (DESYREL ) 50 MG tablet Take 50 mg by mouth at bedtime as needed for sleep.    [provider]    ___________________________________________________________________________________________________ Physical Exam:    03/28/2024    7:49 PM 03/28/2024    4:24 PM 03/28/2024   12:25 PM  Vitals with BMI  Systolic 133 151 858  Diastolic 89 83 88  Pulse 101 104 105     1. General:  in No ***Acute distress***increased work of breathing  ***complaining of severe pain****agitated * Chronically ill *well *cachectic *toxic acutely ill -appearing 2. Psychological: Alert and *** Oriented 3. Head/ENT:   Moist *** Dry Mucous Membranes                          Head Non traumatic, neck supple                          Normal *** Poor Dentition 4. SKIN: normal *** decreased Skin turgor,  Skin clean Dry and intact no rash    5. Heart: Regular rate and rhythm no*** Murmur, no Rub or gallop 6. Lungs: ***Clear to auscultation bilaterally, no wheezes or crackles   7. Abdomen: Soft, ***non-tender, Non distended *** obese ***bowel sounds present 8. Lower extremities: no clubbing, cyanosis, no ***edema 9. Neurologically Grossly intact, moving all 4 extremities equally *** strength 5 out of 5 in all  4 extremities cranial nerves II through XII intact 10. MSK: Normal range of motion    Chart has been reviewed  ______________________________________________________________________________________________  Assessment/Plan  ***  Admitted for *** Bilateral lower extremity edema ***  General weakness ***  Ambulatory dysfunction ***  Hypoxia ***    Present on Admission: **None**     No problem-specific Assessment & Plan notes found for this encounter.    Other plan as per orders.  DVT prophylaxis:  SCD *** Lovenox        Code Status:    Code Status: Prior FULL CODE *** DNR/DNI ***comfort care as per patient ***family  I had personally discussed CODE STATUS with patient and family*  ACP *** none has been reviewed ***   Family Communication:   Family not at  Bedside  plan of care was discussed on the phone with *** Son, Daughter, Wife, Husband, Sister, Brother , father, mother  Diet    Disposition Plan:   *** likely will need placement for rehabilitation                          Back to current facility when stable                            To home once workup is complete and patient is stable  ***Following  barriers for discharge:                             Chest pain *** Stroke *** Syncope ***work up is complete                            Electrolytes corrected                               Anemia corrected h/H stable                             Pain controlled with PO medications                               Afebrile, white count improving able to transition to PO antibiotics                             Will need to be able to tolerate PO                            Will likely need home health, home O2, set up                           Will need consultants to evaluate patient prior to discharge                           Work of breathing improves       Consult Orders  (From admission, onward)           Start     Ordered   03/28/24 2234  Consult to hospitalist  Once  Provider:  (Not yet assigned)  Question Answer Comment  Place call to: [facility]   Reason for Consult Admit      03/28/24 2233                              ***Would benefit from PT/OT eval prior to DC  Ordered                   Swallow eval - SLP ordered                   Diabetes care coordinator                   Transition of care consulted                   Nutrition    consulted                  Wound care  consulted                   Palliative care    consulted                   Behavioral health  consulted                    Consults called: ***  NONE   Admission status:  ED Disposition     ED Disposition  Admit   Condition  --   Comment  The patient appears reasonably stabilized for admission considering the current resources, flow, and capabilities available in the ED at this time, and I doubt any other [facility] requiring further screening and/or treatment in the ED prior to admission is  present.           Obs***  ***  inpatient     I Expect 2 midnight stay secondary to severity of patient's current illness need for inpatient interventions justified by the  following: ***hemodynamic instability despite optimal treatment (tachycardia *hypotension * tachypnea *hypoxia, hypercapnia) *** Severe lab/radiological/exam abnormalities including:    Bilateral lower extremity edema ***  General weakness ***  Ambulatory dysfunction ***  Hypoxia ***  and extensive comorbidities including: *substance abuse  *Chronic pain *DM2  * CHF * CAD  * COPD/asthma *Morbid Obesity * CKD *dementia *liver disease *history of stroke with residual deficits *  malignancy, * sickle cell disease  History of amputation Chronic anticoagulation  That are currently affecting medical management.   I expect  patient to be hospitalized for 2 midnights requiring inpatient medical care.  Patient is at high risk for adverse outcome (such as loss of life or disability) if not treated.  Indication for inpatient stay as follows:  Severe change from baseline regarding mental status Hemodynamic instability despite maximal medical therapy,  severe pain requiring acute inpatient management,  inability to maintain oral hydration   persistent chest pain despite medical management Need for operative/procedural  intervention New or worsening hypoxia ongoing suicidal ideations   Need for IV antibiotics, IV fluids,, IV pain medications, IV anticoagulation,  IV rate controling medications, IV antihypertensives need for biPAP Frequent labs    Level of care   *** tele  For 12H 24H     medical floor       progressive     stepdown   tele indefinitely please discontinue once patient no longer qualifies COVID-19 Labs  Critical***  Patient is critically ill due to  hemodynamic instability * respiratory failure *severe sepsis* ongoing chest pain*  They are at high risk for life/limb threatening clinical deterioration requiring frequent reassessment and modifications of care.  Services provided include examination of the patient, review of relevant ancillary tests,  prescription of lifesaving therapies, review of medications and prophylactic therapy.  Total critical care time excluding separately billable procedures: 60*  Minutes.    Polina Burmaster 03/28/2024, 10:56 PM ***  [facility]     after 2 AM please page floor coverage   If 7AM-7PM, please contact the day team taking care of the patient using [email or web address]

## 2024-03-28 NOTE — Subjective & Objective (Signed)
 Had a fall 5 weeks ago had some leg swelling and redness  Worsening shortness of breath and weakness cannot get out of her recliner Usually on lasix  was changed to torsemide and it not working for her   She is at baseline 4 L  CXR showed CHF  Has stasis dermatitis  Has been treated for cellulitis in the past but bilateral  No CP no fever no cough

## 2024-03-28 NOTE — ED Notes (Signed)
 Pt to Xray

## 2024-03-28 NOTE — ED Notes (Signed)
 Urine collected and sent to lab.

## 2024-03-29 ENCOUNTER — Inpatient Hospital Stay (HOSPITAL_COMMUNITY)

## 2024-03-29 ENCOUNTER — Other Ambulatory Visit (HOSPITAL_COMMUNITY)

## 2024-03-29 ENCOUNTER — Other Ambulatory Visit: Payer: Self-pay

## 2024-03-29 ENCOUNTER — Encounter (HOSPITAL_COMMUNITY): Payer: Self-pay | Admitting: Internal Medicine

## 2024-03-29 DIAGNOSIS — I739 Peripheral vascular disease, unspecified: Secondary | ICD-10-CM

## 2024-03-29 DIAGNOSIS — J9601 Acute respiratory failure with hypoxia: Secondary | ICD-10-CM

## 2024-03-29 DIAGNOSIS — I5021 Acute systolic (congestive) heart failure: Secondary | ICD-10-CM | POA: Diagnosis not present

## 2024-03-29 DIAGNOSIS — R5381 Other malaise: Secondary | ICD-10-CM | POA: Diagnosis present

## 2024-03-29 DIAGNOSIS — R262 Difficulty in walking, not elsewhere classified: Secondary | ICD-10-CM | POA: Diagnosis not present

## 2024-03-29 DIAGNOSIS — I5043 Acute on chronic combined systolic (congestive) and diastolic (congestive) heart failure: Secondary | ICD-10-CM | POA: Diagnosis not present

## 2024-03-29 DIAGNOSIS — I2510 Atherosclerotic heart disease of native coronary artery without angina pectoris: Secondary | ICD-10-CM

## 2024-03-29 DIAGNOSIS — R9431 Abnormal electrocardiogram [ECG] [EKG]: Secondary | ICD-10-CM | POA: Diagnosis present

## 2024-03-29 DIAGNOSIS — R7989 Other specified abnormal findings of blood chemistry: Secondary | ICD-10-CM

## 2024-03-29 DIAGNOSIS — I480 Paroxysmal atrial fibrillation: Secondary | ICD-10-CM | POA: Diagnosis not present

## 2024-03-29 DIAGNOSIS — R600 Localized edema: Secondary | ICD-10-CM | POA: Diagnosis not present

## 2024-03-29 DIAGNOSIS — R531 Weakness: Secondary | ICD-10-CM | POA: Diagnosis not present

## 2024-03-29 DIAGNOSIS — E7800 Pure hypercholesterolemia, unspecified: Secondary | ICD-10-CM

## 2024-03-29 DIAGNOSIS — I5023 Acute on chronic systolic (congestive) heart failure: Secondary | ICD-10-CM | POA: Diagnosis not present

## 2024-03-29 DIAGNOSIS — Z86711 Personal history of pulmonary embolism: Secondary | ICD-10-CM | POA: Diagnosis present

## 2024-03-29 DIAGNOSIS — I872 Venous insufficiency (chronic) (peripheral): Secondary | ICD-10-CM | POA: Diagnosis present

## 2024-03-29 LAB — RESPIRATORY PANEL BY PCR

## 2024-03-29 LAB — TSH: TSH: 3.14 u[IU]/mL (ref 0.350–4.500)

## 2024-03-29 LAB — ECHOCARDIOGRAM COMPLETE
AR max vel: 2.57 cm2
AV Area VTI: 2.61 cm2
AV Area mean vel: 2.27 cm2
AV Mean grad: 3 mmHg
AV Peak grad: 6.7 mmHg
Ao pk vel: 1.29 m/s
Area-P 1/2: 4.99 cm2
Calc EF: 65.6 %
Height: 62 in
MV VTI: 2.04 cm2
S' Lateral: 3 cm
Single Plane A2C EF: 67.6 %
Single Plane A4C EF: 65.2 %
Weight: 4088.21 [oz_av]

## 2024-03-29 LAB — PROCALCITONIN: Procalcitonin: <0.1 ng/mL

## 2024-03-29 LAB — COMPREHENSIVE METABOLIC PANEL WITH GFR
ALT: 10 U/L (ref 0–44)
AST: 19 U/L (ref 15–41)
Albumin: 3.6 g/dL (ref 3.5–5.0)
Alkaline Phosphatase: 79 U/L (ref 38–126)
Anion gap: 14 (ref 5–15)
BUN: 44 mg/dL — ABNORMAL HIGH (ref 8–23)
CO2: 26 mmol/L (ref 22–32)
Calcium: 9.3 mg/dL (ref 8.9–10.3)
Chloride: 101 mmol/L (ref 98–111)
Creatinine, Ser: 1.73 mg/dL — ABNORMAL HIGH (ref 0.44–1.00)
GFR, Estimated: 28 mL/min — ABNORMAL LOW (ref >60)
Glucose, Bld: 104 mg/dL — ABNORMAL HIGH (ref 70–99)
Potassium: 4.1 mmol/L (ref 3.5–5.1)
Sodium: 141 mmol/L (ref 135–145)
Total Bilirubin: 0.5 mg/dL (ref 0.0–1.2)
Total Protein: 6.9 g/dL (ref 6.5–8.1)

## 2024-03-29 LAB — CBC
HCT: 41.3 % (ref 36.0–46.0)
Hemoglobin: 13.4 g/dL (ref 12.0–15.0)
MCH: 30.9 pg (ref 26.0–34.0)
MCHC: 32.4 g/dL (ref 30.0–36.0)
MCV: 95.2 fL (ref 80.0–100.0)
Platelets: 224 K/uL (ref 150–400)
RBC: 4.34 MIL/uL (ref 3.87–5.11)
RDW: 14.8 % (ref 11.5–15.5)
WBC: 9.6 K/uL (ref 4.0–10.5)
nRBC: 0 % (ref 0.0–0.2)

## 2024-03-29 LAB — SARS CORONAVIRUS 2 BY RT PCR: SARS Coronavirus 2 by RT PCR: NEGATIVE

## 2024-03-29 LAB — HEMOGLOBIN A1C
Hgb A1c MFr Bld: 5.7 % — ABNORMAL HIGH (ref 4.8–5.6)
Mean Plasma Glucose: 116.89 mg/dL

## 2024-03-29 LAB — GLUCOSE, CAPILLARY
Glucose-Capillary: 107 mg/dL — ABNORMAL HIGH (ref 70–99)
Glucose-Capillary: 106 mg/dL — ABNORMAL HIGH (ref 70–99)
Glucose-Capillary: 116 mg/dL — ABNORMAL HIGH (ref 70–99)
Glucose-Capillary: 159 mg/dL — ABNORMAL HIGH (ref 70–99)
Glucose-Capillary: 119 mg/dL — ABNORMAL HIGH (ref 70–99)

## 2024-03-29 LAB — TROPONIN T, HIGH SENSITIVITY
Troponin T High Sensitivity: 39 ng/L — ABNORMAL HIGH (ref 0–19)
Troponin T High Sensitivity: 37 ng/L — ABNORMAL HIGH (ref 0–19)

## 2024-03-29 LAB — D-DIMER, QUANTITATIVE: D-Dimer, Quant: 1.68 ug{FEU}/mL — ABNORMAL HIGH (ref 0.00–0.50)

## 2024-03-29 LAB — CBG MONITORING, ED: Glucose-Capillary: 104 mg/dL — ABNORMAL HIGH (ref 70–99)

## 2024-03-29 LAB — PHOSPHORUS: Phosphorus: 3.4 mg/dL (ref 2.5–4.6)

## 2024-03-29 LAB — MAGNESIUM: Magnesium: 2.2 mg/dL (ref 1.7–2.4)

## 2024-03-29 MED ORDER — FUROSEMIDE 10 MG/ML IJ SOLN
40.0000 mg | Freq: Two times a day (BID) | INTRAMUSCULAR | Status: DC
  Administered 2024-03-29 – 2024-04-01 (×6): 40 mg via INTRAVENOUS
  Filled 2024-03-29 (×7): qty 4

## 2024-03-29 MED ORDER — LOPERAMIDE HCL 2 MG PO CAPS
2.0000 mg | ORAL_CAPSULE | Freq: Three times a day (TID) | ORAL | Status: DC | PRN
  Administered 2024-03-29: 2 mg via ORAL
  Filled 2024-03-29: qty 1

## 2024-03-29 MED ORDER — ORAL CARE MOUTH RINSE: 15.0000 mL | OROMUCOSAL | Status: DC | PRN

## 2024-03-29 MED ORDER — ALPRAZOLAM 0.25 MG PO TABS
0.2500 mg | ORAL_TABLET | Freq: Two times a day (BID) | ORAL | Status: DC | PRN
  Administered 2024-03-29 – 2024-04-03 (×7): 0.25 mg via ORAL
  Filled 2024-03-29 (×7): qty 1

## 2024-03-29 MED ORDER — SODIUM CHLORIDE 0.9 % IV SOLN: 250.0000 mL | INTRAVENOUS | Status: AC | PRN

## 2024-03-29 MED ORDER — ENSURE PLUS HIGH PROTEIN PO LIQD
237.0000 mL | Freq: Two times a day (BID) | ORAL | Status: DC
  Administered 2024-04-03: 237 mL via ORAL

## 2024-03-29 MED ORDER — PRAVASTATIN SODIUM 20 MG PO TABS
20.0000 mg | ORAL_TABLET | Freq: Every day | ORAL | Status: DC
Start: 2024-03-29 — End: 2024-04-04
  Administered 2024-03-29 – 2024-04-03 (×6): 20 mg via ORAL
  Filled 2024-03-29 (×7): qty 1

## 2024-03-29 MED ORDER — PROSOURCE PLUS PO LIQD
30.0000 mL | Freq: Every day | ORAL | Status: DC
  Administered 2024-03-29 – 2024-04-03 (×6): 30 mL via ORAL
  Filled 2024-03-29 (×6): qty 30

## 2024-03-29 MED ORDER — TECHNETIUM TO 99M ALBUMIN AGGREGATED
4.0000 | Freq: Once | INTRAVENOUS | Status: AC
  Administered 2024-03-29: 3.2 via INTRAVENOUS

## 2024-03-29 MED ORDER — ALPRAZOLAM 0.25 MG PO TABS
0.2500 mg | ORAL_TABLET | Freq: Two times a day (BID) | ORAL | Status: DC | PRN
  Administered 2024-03-29: 0.25 mg via ORAL
  Filled 2024-03-29: qty 1

## 2024-03-29 MED ORDER — TRAZODONE HCL 50 MG PO TABS
50.0000 mg | ORAL_TABLET | Freq: Every evening | ORAL | Status: DC | PRN
  Administered 2024-03-29 – 2024-04-03 (×5): 50 mg via ORAL
  Filled 2024-03-29 (×5): qty 1

## 2024-03-29 MED ORDER — ACETAMINOPHEN 650 MG RE SUPP: 650.0000 mg | Freq: Four times a day (QID) | RECTAL | Status: DC | PRN

## 2024-03-29 MED ORDER — ACETAMINOPHEN 325 MG PO TABS
650.0000 mg | ORAL_TABLET | Freq: Four times a day (QID) | ORAL | Status: DC | PRN
  Administered 2024-04-01: 650 mg via ORAL
  Filled 2024-03-29: qty 2

## 2024-03-29 MED ORDER — ENOXAPARIN SODIUM 30 MG/0.3ML IJ SOSY
30.0000 mg | PREFILLED_SYRINGE | INTRAMUSCULAR | Status: DC
  Administered 2024-03-29: 30 mg via SUBCUTANEOUS
  Filled 2024-03-29: qty 0.3

## 2024-03-29 MED ORDER — APIXABAN 5 MG PO TABS
5.0000 mg | ORAL_TABLET | Freq: Two times a day (BID) | ORAL | Status: DC
  Administered 2024-03-30: 5 mg via ORAL
  Filled 2024-03-29: qty 1

## 2024-03-29 MED ORDER — ADULT MULTIVITAMIN W/MINERALS CH
1.0000 | ORAL_TABLET | Freq: Every day | ORAL | Status: DC
Start: 2024-03-29 — End: 2024-04-04
  Administered 2024-03-29 – 2024-04-04 (×6): 1 via ORAL
  Filled 2024-03-29 (×6): qty 1

## 2024-03-29 MED ORDER — SODIUM CHLORIDE 0.9% FLUSH: 3.0000 mL | INTRAVENOUS | Status: DC | PRN

## 2024-03-29 MED ORDER — SODIUM CHLORIDE 0.9% FLUSH
3.0000 mL | Freq: Two times a day (BID) | INTRAVENOUS | Status: DC
  Administered 2024-03-29 – 2024-04-04 (×14): 3 mL via INTRAVENOUS

## 2024-03-29 MED ORDER — APIXABAN 2.5 MG PO TABS: 2.5000 mg | ORAL_TABLET | Freq: Two times a day (BID) | ORAL | Status: DC

## 2024-03-29 MED ORDER — HYDROCODONE-ACETAMINOPHEN 5-325 MG PO TABS
1.0000 | ORAL_TABLET | ORAL | Status: DC | PRN
  Administered 2024-03-29: 1 via ORAL
  Administered 2024-03-29: 2 via ORAL
  Administered 2024-03-30 (×2): 1 via ORAL
  Administered 2024-03-30: 2 via ORAL
  Administered 2024-03-31: 1 via ORAL
  Administered 2024-03-31 – 2024-04-03 (×8): 2 via ORAL
  Filled 2024-03-29 (×4): qty 2
  Filled 2024-03-29: qty 1
  Filled 2024-03-29: qty 2
  Filled 2024-03-29 (×2): qty 1
  Filled 2024-03-29 (×2): qty 2
  Filled 2024-03-29: qty 1
  Filled 2024-03-29 (×3): qty 2

## 2024-03-29 NOTE — Assessment & Plan Note (Signed)
-   Pt diagnosed with CHF based on presence of the following:  rales on exam, , cardiomegaly,  bilateral leg edema, tachycardia    With noted response to IV diuretic in ER  admit on telemetry,  cycle cardiac enzymes,    obtain serial ECG  to evaluate for ischemia as a cause of heart failure  monitor daily weight: There were no vitals filed for this visit.      diurese with IV lasix    40 mg IV BID    and monitor orthostatics and creatinine to avoid over diuresis.  Order echogram to evaluate EF and valves  ACE/ARBi  Contraindicated    Cardiology emailed

## 2024-03-29 NOTE — H&P (Incomplete)
 Pamela [name] FMW:[id number] DOB: [birth year] DOA: 03/28/2024     PCP: [name], MD   Outpatient Specialists:   CARDS:  Dr.  Court  NEphrology: *  Dr. No care team member to display  NEurology   Dr. Tobie Pulmonary *  Dr.  Oncology * Dr.No care team member to display  GI* Dr.  Gwen, LB) Aneita Gwendlyn DASEN, MD (Inactive) Urology Dr. *  Patient arrived to ER on 03/28/24 at 1208 Referred by Attending Trifan, Donnice PARAS, MD   Patient coming from:    home Lives With family     Chief Complaint:   Chief Complaint  Patient presents with  . Fall  . Impaired Mobility     HPI: AHRIANA [name] is a 89 y.o. female with medical history significant of  Dm2, CKD stage IV anemia of chronic disease, history of A-fib and PE on Eliquis     Presented with worsening leg edema and inability to ambulate Had a fall 5 weeks ago had some leg swelling and redness  Worsening shortness of breath and weakness cannot get out of her recliner Usually on lasix  was changed to torsemide and it not working for her   She is at baseline 4 L  CXR showed CHF  Has stasis dermatitis  Has been treated for cellulitis in the past but bilateral  No CP no fever no cough    Denies significant ETOH intake *** Does not smoke*** but interested in quitting***  Denies marijuana use ***    Regarding pertinent Chronic problems:    Hyperlipidemia - on statins   Mevacor   HTN on Norvasc  ***chronic CHF diastolic/systolic/ combined - last echo*** Recent Results (from the past 56199 hours)  ECHOCARDIOGRAM COMPLETE   Collection Time: 12/29/21  9:52 AM  Result Value   Weight 4,303.38   Height 62   BP 122/63   S' Lateral 2.70   AR max vel 2.53   AV Area VTI 2.30   AV Mean grad 5.0   AV Peak grad 8.8   Ao pk vel 1.48   Area-P 1/2 2.57   AV Area mean vel 2.32   Narrative      ECHOCARDIOGRAM REPORT         1. Left ventricular ejection fraction, by estimation, is 45 to 50%. The left ventricle has  mildly decreased function. The left ventricle demonstrates global hypokinesis. There is mild left ventricular hypertrophy. Left ventricular diastolic parameters  are consistent with Grade II diastolic dysfunction (pseudonormalization).  2. Right ventricular systolic function is normal. The right ventricular size is normal.  3. The mitral valve is normal in structure. No evidence of mitral valve regurgitation. No evidence of mitral stenosis. Moderate mitral annular calcification.  4. The aortic valve is normal in structure. Aortic valve regurgitation is not visualized. No aortic stenosis is present.  5. The inferior vena cava is normal in size with greater than 50% respiratory variability, suggesting right atrial pressure of 3 mmHg.             DM 2 -  Lab Results  Component Value Date   HGBA1C 5.7 (H) 12/29/2021    diet controlled      Morbid obesity-   BMI Readings from Last 1 Encounters:  02/23/23 47.50 kg/m     *** Asthma -well *** controlled on home inhalers/ nebs                     *** COPD -  not **followed by pulmonology *** not  on baseline oxygen   *L,    *** OSA -on nocturnal oxygen , *CPAP, *noncompliant with CPAP    A. Fib -   atrial fibrillation CHA2DS2 vas score  9      current  on anticoagulation with  Eliquis ,    ***Hx of DVT/PE on - anticoagulation with Eliquis ,     CKD stage IIIb  baseline Cr 1.3 CrCl cannot be calculated (Unknown ideal weight.).  Lab Results  Component Value Date   CREATININE 1.62 (H) 03/28/2024   CREATININE 1.26 (H) 02/23/2023   CREATININE 1.46 (H) 01/03/2022   Lab Results  Component Value Date   NA 141 03/28/2024   CL 103 03/28/2024   K 4.2 03/28/2024   CO2 25 03/28/2024   BUN 43 (H) 03/28/2024   CREATININE 1.62 (H) 03/28/2024   GFRNONAA 30 (L) 03/28/2024   CALCIUM  9.1 03/28/2024   PHOS 3.4 01/03/2022   ALBUMIN  3.8 02/23/2023   GLUCOSE 107 (H) 03/28/2024     Seizure DO - las seizure *** currently on       While in  ER: Clinical Course as of 03/28/24 2256  Thu Mar 28, 2024  1936 Initial labs were hemolyzed and we have been awaiting the repeat levels. [MT]  2036 Repeat CMP also hemolyzed .... [MT]  2218 3rd draw now sent to lab for BMP.  Home evening meds ordered for patient. I've explained to her and her husband plan for admission for CHF with hypoxia [MT]    Clinical Course User Index [MT] Trifan, Donnice PARAS, MD         Lab Orders         CBC with Differential         Urinalysis, Routine w reflex microscopic -Urine, Clean Catch         Blood gas, venous         Magnesium         Pro Brain natriuretic peptide         Basic metabolic panel         TSH        CXR - Cardiac enlargement with pulmonary vascular congestion    Following Medications were ordered in ER: Medications  traZODone  (DESYREL ) tablet 50 mg (50 mg Oral Given 03/28/24 2154)  furosemide  (LASIX ) injection 40 mg (40 mg Intravenous Given 03/28/24 1740)  ALPRAZolam  (XANAX ) tablet 0.25 mg (0.25 mg Oral Given 03/28/24 2154)  acetaminophen  (TYLENOL ) tablet 1,000 mg (1,000 mg Oral Given 03/28/24 2154)       ED Triage Vitals  Encounter Vitals Group     BP 03/28/24 1225 (!) 141/88     Girls Systolic BP Percentile --      Girls Diastolic BP Percentile --      Boys Systolic BP Percentile --      Boys Diastolic BP Percentile --      Pulse Rate 03/28/24 1225 (!) 105     Resp 03/28/24 1225 18     Temp 03/28/24 1225 97.8 F (36.6 C)     Temp Source 03/28/24 1225 Oral     SpO2 03/28/24 1225 97 %     Weight --      Height --      Head Circumference --      Peak Flow --      Pain Score 03/28/24 1233 0     Pain Loc --      Pain Education --  Exclude from Growth Chart --   UFJK(75)@     _________________________________________ Significant initial  Findings: Abnormal Labs Reviewed  CBC WITH DIFFERENTIAL/PLATELET - Abnormal; Notable for the following components:      Result Value   Neutro Abs 7.8 (*)    All other  components within normal limits  URINALYSIS, ROUTINE W REFLEX MICROSCOPIC - Abnormal; Notable for the following components:   APPearance HAZY (*)    Glucose, UA >=500 (*)    Ketones, ur 5 (*)    Protein, ur 100 (*)    All other components within normal limits  BLOOD GAS, VENOUS - Abnormal; Notable for the following components:   pO2, Ven <31 (*)    Bicarbonate 35.2 (*)    Acid-Base Excess 9.1 (*)    All other components within normal limits  PRO BRAIN NATRIURETIC PEPTIDE - Abnormal; Notable for the following components:   Pro Brain Natriuretic Peptide 698.0 (*)    All other components within normal limits  BASIC METABOLIC PANEL WITH GFR - Abnormal; Notable for the following components:   Glucose, Bld 107 (*)    BUN 43 (*)    Creatinine, Ser 1.62 (*)    GFR, Estimated 30 (*)    All other components within normal limits      _________________________ Troponin ***ordered Cardiac Panel (last 3 results) No results for input(s): CKTOTAL, CKMB, TROPONINIHS, RELINDX in the last 72 hours.   ECG: Ordered Personally reviewed and interpreted by me showing: HR : *** Rhythm: *NSR, Sinus tachycardia * A.fib. W RVR, RBBB, LBBB, Paced Ischemic changes*nonspecific changes, no evidence of ischemic changes QTC*  BNP (last 3 results) No results for input(s): BNP in the last 8760 hours.    No results for input(s): DDIMER, FERRITIN, LDH, CRP in the last 72 hours.    ____________________ This patient meets SIRS Criteria and may be septic. SIRS = Systemic Inflammatory Response Syndrome  Order a lactic acid level if needed AND/OR Initiate the sepsis protocol with the attached order set OR Click Treating Associated Infection or Illness if the patient is being treated for an infection that is a known cause of these abnormalities     The recent clinical data is shown below. Vitals:   03/28/24 1232 03/28/24 1233 03/28/24 1624 03/28/24 1949  BP:   (!) 151/83 133/89  Pulse:    (!) 104 (!) 101  Resp:   18 19  Temp:   97.7 F (36.5 C) 97.6 F (36.4 C)  TempSrc:   Oral Oral  SpO2: 100% 100% 97% 95%        WBC     Component Value Date/Time   WBC 9.7 03/28/2024 1602   LYMPHSABS 1.1 03/28/2024 1602   MONOABS 0.7 03/28/2024 1602   EOSABS 0.1 03/28/2024 1602   BASOSABS 0.1 03/28/2024 1602        Lactic Acid, Venous    Component Value Date/Time   LATICACIDVEN 1.2 03/28/2024 1618     Lactic Acid, Venous    Component Value Date/Time   LATICACIDVEN 1.2 03/28/2024 1618    Procalcitonin *** Ordered      UA *** no evidence of UTI  ***Pending ***not ordered   Urine analysis:    Component Value Date/Time   COLORURINE YELLOW 03/28/2024 1531   APPEARANCEUR HAZY (A) 03/28/2024 1531   LABSPEC 1.016 03/28/2024 1531   PHURINE 5.0 03/28/2024 1531   GLUCOSEU >=500 (A) 03/28/2024 1531   HGBUR NEGATIVE 03/28/2024 1531   BILIRUBINUR NEGATIVE 03/28/2024 1531  KETONESUR 5 (A) 03/28/2024 1531   PROTEINUR 100 (A) 03/28/2024 1531   NITRITE NEGATIVE 03/28/2024 1531   LEUKOCYTESUR NEGATIVE 03/28/2024 1531    Results for orders placed or performed during the hospital encounter of 12/28/21  Blood culture (routine x 2)     Status: None   Collection Time: 12/28/21 11:50 AM   Specimen: BLOOD RIGHT FOREARM  Result Value Ref Range Status   Specimen Description   Final    BLOOD RIGHT FOREARM Performed at [facility], [address], [place], [place] [address]    Special Requests   Final    BOTTLES DRAWN AEROBIC AND ANAEROBIC Blood Culture adequate volume Performed at [facility], [address], [place], [place] [address]    Culture   Final    NO GROWTH 5 DAYS Performed at [facility], [address], [place], [place] [address]    Report Status 01/02/2022 FINAL  Final  SARS Coronavirus 2 by RT PCR (hospital order, performed in [facility] hospital lab) *cepheid single result test* Anterior Nasal Swab      Status: None   Collection Time: 12/28/21 11:51 AM   Specimen: Anterior Nasal Swab  Result Value Ref Range Status   SARS Coronavirus 2 by RT PCR NEGATIVE NEGATIVE Final    Comment: (NOTE) SARS-CoV-2 target nucleic acids are NOT DETECTED.  The SARS-CoV-2 RNA is generally detectable in upper and lower respiratory specimens during the acute phase of infection. The lowest concentration of SARS-CoV-2 viral copies this assay can detect is 250 copies / mL. A negative result does not preclude SARS-CoV-2 infection and should not be used as the sole basis for treatment or other patient management decisions.  A negative result may occur with improper specimen collection / handling, submission of specimen other than nasopharyngeal swab, presence of viral mutation(s) within the areas targeted by this assay, and inadequate number of viral copies (<250 copies / mL). A negative result must be combined with clinical observations, patient history, and epidemiological information.  Fact Sheet for Patients:   RoadLapTop.co.za  Fact Sheet for Healthcare Providers: http://kim-miller.com/  This test is not yet approved or  cleared by the [place]  FDA and has been authorized for detection and/or diagnosis of SARS-CoV-2 by FDA under an Emergency Use Authorization (EUA).  This EUA will remain in effect (meaning this test can be used) for the duration of the COVID-19 declaration under Section 564(b)(1) of the Act, 21 U.S.C. section 360bbb-3(b)(1), unless the authorization is terminated or revoked sooner.  Performed at [facility], [address] [place], [place] [address]   Blood culture (routine x 2)     Status: None   Collection Time: 12/28/21  7:22 PM   Specimen: BLOOD  Result Value Ref Range Status   Specimen Description   Final    BLOOD BLOOD LEFT HAND Performed at [facility], [address]  [place], [place] [address]    Special Requests   Final    IN PEDIATRIC BOTTLE Blood Culture adequate volume Performed at [facility], [address] [place], [place] [address]    Culture   Final    NO GROWTH 5 DAYS Performed at [facility], [address], [place], [place] [address]    Report Status 01/02/2022 FINAL  Final    ABX started Antibiotics Given (last 72 hours)     None        No results found for the last  90 days.    ________________________________________________________________  Arterial ***Venous  Blood Gas result:  pH *** pCO2 ***; pO2 ***;     %O2 Sat ***.  ABG    Component Value Date/Time   HCO3 35.2 (H) 03/28/2024 1602   TCO2 25 04/29/2017 0702   O2SAT 56.1 03/28/2024 1602       __________________________________________________________ Recent Labs  Lab 03/28/24 1753 03/28/24 2140  NA  --  141  K  --  4.2  CO2  --  25  GLUCOSE  --  107*  BUN  --  43*  CREATININE  --  1.62*  CALCIUM   --  9.1  MG 2.3  --     Cr  * stable,  Up from baseline see below Lab Results  Component Value Date   CREATININE 1.62 (H) 03/28/2024   CREATININE 1.26 (H) 02/23/2023   CREATININE 1.46 (H) 01/03/2022    No results for input(s): AST, ALT, ALKPHOS, BILITOT, PROT, ALBUMIN  in the last 168 hours. Lab Results  Component Value Date   CALCIUM  9.1 03/28/2024   PHOS 3.4 01/03/2022          Plt: Lab Results  Component Value Date   PLT 233 03/28/2024         Recent Labs  Lab 03/28/24 1602  WBC 9.7  NEUTROABS 7.8*  HGB 13.6  HCT 43.9  MCV 95.6  PLT 233    HG/HCT * stable,  Down *Up from baseline see below    Component Value Date/Time   HGB 13.6 03/28/2024 1602   HCT 43.9 03/28/2024 1602   MCV 95.6 03/28/2024 1602      No results for input(s): LIPASE, AMYLASE in the last 168 hours. No results for input(s): AMMONIA in the last 168 hours.     _______________________________________________ Hospitalist was called for admission for *** Bilateral lower extremity edema ***  General weakness ***  Ambulatory dysfunction ***  Hypoxia ***    The following Work up has been ordered so far:  Orders Placed This Encounter  Procedures  . DG Chest Port 1 View  . CBC with Differential  . Urinalysis, Routine w reflex microscopic -Urine, Clean Catch  . Blood gas, venous  . Magnesium  . Pro Brain natriuretic peptide  . Basic metabolic panel  . TSH  . Consult to hospitalist  . ED EKG  . Saline lock IV     OTHER Significant initial  Findings:  labs showing:     DM  labs:  HbA1C: No results for input(s): HGBA1C in the last 8760 hours.     CBG (last 3)  No results for input(s): GLUCAP in the last 72 hours.        Cultures:    Component Value Date/Time   SDES  12/28/2021 1922    BLOOD BLOOD LEFT HAND Performed at [facility], [address] [place], [place] [address]    SPECREQUEST  12/28/2021 1922    IN PEDIATRIC BOTTLE Blood Culture adequate volume Performed at [facility], [address], [place], [place] [address]    CULT  12/28/2021 1922    NO GROWTH 5 DAYS Performed at [facility], [address], [place], [place] [address]    REPTSTATUS 01/02/2022 FINAL 12/28/2021 1922     Radiological Exams on Admission: DG Chest Port 1 View Result Date: 03/28/2024 CLINICAL DATA:  Lower extremity swelling.  Fell 5 weeks ago. EXAM: PORTABLE CHEST 1 VIEW COMPARISON:  12/28/2021 FINDINGS: Shallow  inspiration with atelectasis in the lung bases. Cardiac enlargement with mild vascular congestion. No obvious edema or consolidation. No pleural effusion or pneumothorax. Mediastinal contours appear intact. Calcified and tortuous aorta. Degenerative changes in the spine and shoulders. IMPRESSION: Shallow inspiration with atelectasis in the lung bases. Cardiac enlargement with  pulmonary vascular congestion. No edema. Electronically Signed   By: Elsie Gravely M.D.   On: 03/28/2024 17:17   _______________________________________________________________________________________________________ Latest  Blood pressure 133/89, pulse (!) 101, temperature 97.6 F (36.4 C), temperature source Oral, resp. rate 19, SpO2 95%.   Vitals  labs and radiology finding personally reviewed  Review of Systems:    Pertinent positives include: ***  Constitutional:  No weight loss, night sweats, Fevers, chills, fatigue, weight loss  HEENT:  No headaches, Difficulty swallowing,Tooth/dental problems,Sore throat,  No sneezing, itching, ear ache, nasal congestion, post nasal drip,  Cardio-vascular:  No chest pain, Orthopnea, PND, anasarca, dizziness, palpitations.no Bilateral lower extremity swelling  GI:  No heartburn, indigestion, abdominal pain, nausea, vomiting, diarrhea, change in bowel habits, loss of appetite, melena, blood in stool, hematemesis Resp:  no shortness of breath at rest. No dyspnea on exertion, No excess mucus, no productive cough, No non-productive cough, No coughing up of blood.No change in color of mucus.No wheezing. Skin:  no rash or lesions. No jaundice GU:  no dysuria, change in color of urine, no urgency or frequency. No straining to urinate.  No flank pain.  Musculoskeletal:  No joint pain or no joint swelling. No decreased range of motion. No back pain.  Psych:  No change in mood or affect. No depression or anxiety. No memory loss.  Neuro: no localizing neurological complaints, no tingling, no weakness, no double vision, no gait abnormality, no slurred speech, no confusion  All systems reviewed and apart from HOPI all are negative _______________________________________________________________________________________________ Past Medical History:   Past Medical History:  Diagnosis Date  . Acute diastolic CHF (congestive heart failure) (HCC)  10/11/2019  . Arthritis   . Bilateral lower extremity edema    left lower extremity cellulitis  . Common bile duct stone 06/22/2017  . Diabetes (HCC)   . Diverticulosis   . Duodenal diverticulum, periampullary 11/03/2018   Aborted ERCP 2016  . GERD (gastroesophageal reflux disease)   . History of hiatal hernia   . Hypertension   . Kidney disease, chronic, stage III (GFR 30-59 ml/min) (HCC)   . Obesity   . Osteopenia   . Seizures (HCC) 22 yrs ago   no cause found  . Tubular adenoma of colon 2009      Past Surgical History:  Procedure Laterality Date  . ABDOMINAL HYSTERECTOMY    . APPENDECTOMY    . CHOLECYSTECTOMY OPEN  1977  . COLONOSCOPY WITH PROPOFOL  N/A 10/16/2014   Procedure: COLONOSCOPY WITH PROPOFOL ;  Surgeon: Gwendlyn ONEIDA Buddy, MD;  Location: [facility] ENDOSCOPY;  Service: Endoscopy;  Laterality: N/A;  . ENDOSCOPIC RETROGRADE CHOLANGIOPANCREATOGRAPHY (ERCP) WITH PROPOFOL  N/A 06/02/2017   Procedure: ENDOSCOPIC RETROGRADE CHOLANGIOPANCREATOGRAPHY (ERCP) WITH PROPOFOL ;  Surgeon: Buddy Gwendlyn ONEIDA, MD;  Location: [facility] ENDOSCOPY;  Service: Endoscopy;  Laterality: N/A;  . TONSILLECTOMY      Social History:  Ambulatory *** independently cane, walker  wheelchair bound, bed bound     reports that she has quit smoking. Her smoking use included cigarettes. She has a 20 pack-year smoking history. She has never used smokeless tobacco. She reports that she does not drink alcohol and does not use drugs.     Family History: *** Family  History  Problem Relation Age of Onset  . Hypertension Brother    ______________________________________________________________________________________________ Allergies: Allergies  Allergen Reactions  . Levaquin [Levofloxacin In D5w] Nausea And Vomiting  . Lisinopril Cough     Prior to Admission medications   Medication Sig Start Date End Date Taking? Authorizing Provider  acetaminophen  (TYLENOL  8 HOUR ARTHRITIS PAIN) 650 MG CR tablet Take 2 tablets by  mouth in the morning and at bedtime.    [provider]  ALPRAZolam  (XANAX ) 0.25 MG tablet Take 0.125-0.25 mg by mouth 2 (two) times daily as needed. 01/13/22   [provider]  amLODipine (NORVASC) 2.5 MG tablet Take 2.5 mg by mouth daily. 02/01/22   [provider]  apixaban  (ELIQUIS ) 5 MG TABS tablet Take 1 tablet (5 mg total) by mouth 2 (two) times daily. 02/16/22   Court Dorn PARAS, MD  calcium  carbonate (OSCAL) 1500 (600 Ca) MG TABS tablet Take 1,500 mg by mouth 2 (two) times daily with a meal.    [provider]  dapagliflozin  propanediol (FARXIGA ) 10 MG TABS tablet Take 10 mg by mouth daily.    [provider]  diclofenac  Sodium (VOLTAREN ) 1 % GEL Apply 2 g topically 4 (four) times daily. 01/03/22   Sheikh, Omair Latif, DO  furosemide  (LASIX ) 20 MG tablet Take 1 tablet (20 mg total) by mouth daily as needed for edema (or weight gain). 05/06/21 05/06/22  Fausto Sor A, DO  K Phos Mono-Sod Phos Di & Mono (PHOSPHA 250 NEUTRAL) 155-852-130 MG TABS Take 2 tablets by mouth daily. 01/31/22   [provider]  Lancets [facility] DELICA PLUS [place]) MISC  07/09/21   [provider]  lovastatin (MEVACOR) 20 MG tablet Take 20 mg by mouth at bedtime.    [provider]  OVER THE COUNTER MEDICATION Take 1 capsule by mouth daily. Daily fiber 100% psyllium. Patient not taking: Reported on 02/16/2022    [provider]  Probiotic Product (ALIGN) 4 MG CAPS Take 4 mg by mouth daily.    [provider]  traZODone  (DESYREL ) 50 MG tablet Take 50 mg by mouth at bedtime as needed for sleep.    [provider]    ___________________________________________________________________________________________________ Physical Exam:    03/28/2024    7:49 PM 03/28/2024    4:24 PM 03/28/2024   12:25 PM  Vitals with BMI  Systolic 133 151 858  Diastolic 89 83 88  Pulse 101 104 105     1. General:  in No ***Acute  distress***increased work of breathing ***complaining of severe pain****agitated * Chronically ill *well *cachectic *toxic acutely ill -appearing 2. Psychological: Alert and *** Oriented 3. Head/ENT:   Moist *** Dry Mucous Membranes                          Head Non traumatic, neck supple                          Normal *** Poor Dentition 4. SKIN: normal *** decreased Skin turgor,  Skin clean Dry and intact no rash    5. Heart: Regular rate and rhythm no*** Murmur, no Rub or gallop 6. Lungs: ***Clear to auscultation bilaterally, no wheezes or crackles   7. Abdomen: Soft, ***non-tender, Non distended *** obese ***bowel sounds present 8. Lower extremities: no clubbing, cyanosis, no ***edema 9. Neurologically Grossly intact, moving all 4 extremities equally *** strength 5 out of 5 in all  4 extremities cranial nerves II through XII intact 10. MSK: Normal range of motion    Chart has been reviewed  ______________________________________________________________________________________________  Assessment/Plan  ***  Admitted for *** Bilateral lower extremity edema ***  General weakness ***  Ambulatory dysfunction ***  Hypoxia ***    Present on Admission: **None**     No problem-specific Assessment & Plan notes found for this encounter.    Other plan as per orders.  DVT prophylaxis:  SCD *** Lovenox        Code Status:    Code Status: Prior FULL CODE *** DNR/DNI ***comfort care as per patient ***family  I had personally discussed CODE STATUS with patient and family*  ACP *** none has been reviewed ***   Family Communication:   Family not at  Bedside  plan of care was discussed on the phone with *** Son, Daughter, Wife, Husband, Sister, Brother , father, mother  Diet    Disposition Plan:   *** likely will need placement for rehabilitation                          Back to current facility when stable                            To home once workup is complete and  patient is stable  ***Following barriers for discharge:                             Chest pain *** Stroke *** Syncope ***work up is complete                            Electrolytes corrected                               Anemia corrected h/H stable                             Pain controlled with PO medications                               Afebrile, white count improving able to transition to PO antibiotics                             Will need to be able to tolerate PO                            Will likely need home health, home O2, set up                           Will need consultants to evaluate patient prior to discharge                           Work of breathing improves       Consult Orders  (From admission, onward)           Start     Ordered   03/28/24 2234  Consult to hospitalist  Once  Provider:  (Not yet assigned)  Question Answer Comment  Place call to: [facility]   Reason for Consult Admit      03/28/24 2233                              ***Would benefit from PT/OT eval prior to DC  Ordered                   Swallow eval - SLP ordered                   Diabetes care coordinator                   Transition of care consulted                   Nutrition    consulted                  Wound care  consulted                   Palliative care    consulted                   Behavioral health  consulted                    Consults called: ***  NONE   Admission status:  ED Disposition     ED Disposition  Admit   Condition  --   Comment  The patient appears reasonably stabilized for admission considering the current resources, flow, and capabilities available in the ED at this time, and I doubt any other [facility] requiring further screening and/or treatment in the ED prior to admission is  present.           Obs***  ***  inpatient     I Expect 2 midnight stay secondary to severity of patient's current illness need for inpatient  interventions justified by the following: ***hemodynamic instability despite optimal treatment (tachycardia *hypotension * tachypnea *hypoxia, hypercapnia) *** Severe lab/radiological/exam abnormalities including:    Bilateral lower extremity edema ***  General weakness ***  Ambulatory dysfunction ***  Hypoxia ***  and extensive comorbidities including: *substance abuse  *Chronic pain *DM2  * CHF * CAD  * COPD/asthma *Morbid Obesity * CKD *dementia *liver disease *history of stroke with residual deficits *  malignancy, * sickle cell disease  History of amputation Chronic anticoagulation  That are currently affecting medical management.   I expect  patient to be hospitalized for 2 midnights requiring inpatient medical care.  Patient is at high risk for adverse outcome (such as loss of life or disability) if not treated.  Indication for inpatient stay as follows:  Severe change from baseline regarding mental status Hemodynamic instability despite maximal medical therapy,  severe pain requiring acute inpatient management,  inability to maintain oral hydration   persistent chest pain despite medical management Need for operative/procedural  intervention New or worsening hypoxia ongoing suicidal ideations   Need for IV antibiotics, IV fluids,, IV pain medications, IV anticoagulation,  IV rate controling medications, IV antihypertensives need for biPAP Frequent labs    Level of care   *** tele  For 12H 24H     medical floor       progressive     stepdown   tele indefinitely please discontinue once patient no longer qualifies COVID-19 Labs  Critical***  Patient is critically ill due to  hemodynamic instability * respiratory failure *severe sepsis* ongoing chest pain*  They are at high risk for life/limb threatening clinical deterioration requiring frequent reassessment and modifications of care.  Services provided include examination of the patient, review of  relevant ancillary tests, prescription of lifesaving therapies, review of medications and prophylactic therapy.  Total critical care time excluding separately billable procedures: 60*  Minutes.    Braelon Sprung 03/28/2024, 10:56 PM ***  [facility]     after 2 AM please page floor coverage   If 7AM-7PM, please contact the day team taking care of the patient using [email or web address]

## 2024-03-29 NOTE — Assessment & Plan Note (Signed)
Would benefit from PT OT evaluation prior to discharge

## 2024-03-29 NOTE — Progress Notes (Signed)
 VASCULAR LAB    Arrived to patient's room at 10:35.  Patient immediately refused exam, stating she will agree to all tests except for a venous duplex as her legs are in pain already, and her prior venous exams were extremely painful. Relayed refusal to Dr. Davia and to Anette Loges, RN via secure chat.   Talbot Monarch, RVT 03/29/2024, 10:39 AM

## 2024-03-29 NOTE — Progress Notes (Signed)
 [facility]                                                                              Pamela [name], is a 89 y.o. female, DOB - 1934/07/27, FMW:[id number] Admit date - 03/28/2024    Outpatient Primary MD for the patient is [name], MD  LOS - 1  days  Chief Complaint  Patient presents with   Fall   Impaired Mobility        Brief summary   Patient is 89 year old female with diabetes mellitus type 2, CKD stage IV, anemia of chronic disease, history of A-fib, PE off Eliquis , combined systolic and diastolic CHF presented to ED with worsening lower extremity swelling and inability to walk.  Patient reported that she had a fall which made it difficult for her to walk and she has been mainly sitting in the recliner with her legs down which made her edema worse.  When she tried to ambulate she became profoundly more weaker, has been needing help to go to the bathroom.  She also feels her abdomen is swollen.  Worsening shortness of breath, not on O2 at baseline however in the ED required up to 4 L O2 via [place]. She also had a fall 5 weeks ago and was noted to have leg swelling and redness with possible cellulitis and was placed on Keflex which she has completed the course. Usually on Lasix , was changed to torsemide and feels not working. In ED, felt somewhat improved after getting IV Lasix   Assessment & Plan    Acute on chronic combined systolic and diastolic CHF Acute tori failure with hypoxia - 2D echo in 12/2021 had shown EF of 45 to 50%, G2 DD, global hypokinesis.   - Presented with lower extremity edema,  hypoxia, essentially bedridden in the recliner for 5 weeks after a fall.  Although denies overt shortness of breath however was found to have hypoxia, placed on 4 L O2 via [place], elevated BNP and chest x-ray with volume overload - Placed on IV Lasix  40 mg every 12 hours, follow strict I's and O's and daily weights - Cardiology consulted - Follow 2D echo - Lower  extremity Dopplers were ordered to rule out DVT given her bedridden state for 5 weeks and edema however patient refused to have the imaging done (per RN, she stated that it is painful during imaging)    Acute respiratory failure with hypoxia -History of PE - Continue IV Lasix , wean O2 as tolerated - Follow VQ scan, has a prior history of PE and now has been bedridden for last 5 weeks, refuses venous Dopplers of the lower extremity  Paroxysmal atrial fibrillation - Diagnosed in 2023,CHADS2VASC score is 7  -Has been on the Lopressor  in the past, but patient stopped it, currently NSR - Started on Eliquis  2.5 mg twice daily  Generalized debility, venous stasis dermatitis - Patient has completed course of Keflex, appears to have venous stasis dermatitis, hence hold off antibiotics.  If worsening redness, fevers chills, leukocytosis then will consider antibiotics.   AKI on CKD stage IIIb - Presented with creatinine of 1.6, trended up to 1.7 today  likely due to Lasix  diuresis - Continue to follow   Elevated troponin, hyperlipidemia - Follow 2D echo, likely demand ischemia, no acute chest pain - Cardiology following.    Nutrition Problem: Inadequate oral intake Etiology: acute illness  Interventions: Ensure Enlive (each supplement provides 350kcal and 20 grams of protein), MVI, Refer to RD note for recommendations  Obesity class III Estimated body mass index is 46.73 kg/m as calculated from the following:   Height as of this encounter: 5' 2 (1.575 m).   Weight as of this encounter: 115.9 kg.  Code Status: DNR DVT Prophylaxis:  enoxaparin  (LOVENOX ) injection 30 mg Start: 03/29/24 1000   Level of Care: Level of care: Progressive Family Communication: Updated patient's husband at the bedside Disposition Plan:      Remains inpatient appropriate:      Procedures:    Consultants:   Cardiology  Antimicrobials:   Anti-infectives (From admission, onward)    None           Medications  (feeding supplement) PROSource Plus  30 mL Oral Daily   enoxaparin  (LOVENOX ) injection  30 mg Subcutaneous Q24H   feeding supplement  237 mL Oral BID BM   furosemide   40 mg Intravenous Q12H   insulin  aspart  0-9 Units Subcutaneous Q4H   multivitamin with minerals  1 tablet Oral Daily   pravastatin   20 mg Oral q1800   sodium chloride  flush  3 mL Intravenous Q12H      Subjective:   Pamela [name] was seen and examined today.  Sitting upright on the bed, states lower legs are looking much better with IV Lasix  she received.  No acute chest pain, shortness of breath, nausea or vomiting.    Objective:   Vitals:   03/29/24 0055 03/29/24 0118 03/29/24 0458 03/29/24 1114  BP: (!) 155/89  (!) 150/77 (!) 164/98  Pulse: 100  100 (!) 109  Resp: 20  18 20   Temp: 97.8 F (36.6 C)  98.2 F (36.8 C) 97.9 F (36.6 C)  TempSrc:   Oral Oral  SpO2: 95%  97% 98%  Weight:  115.9 kg    Height:  5' 2 (1.575 m)      Intake/Output Summary (Last 24 hours) at 03/29/2024 1357 Last data filed at 03/29/2024 1144 Gross per 24 hour  Intake 833 ml  Output 1600 ml  Net -767 ml     Wt Readings from Last 3 Encounters:  03/29/24 115.9 kg  02/23/23 117.8 kg  02/16/22 117.8 kg     Exam General: Alert and oriented x 3, NAD Cardiovascular: S1 S2 auscultated,  RRR Respiratory: Bibasilar crackles Gastrointestinal: Soft, nontender, nondistended, + bowel sounds Ext: 1+pedal edema bilaterally Neuro: No neurodeficits Skin: stasis dermatitis, no active wounds, has some bilateral erythema with warmth Psych: Normal affect     Data Reviewed:  I have personally reviewed following labs    CBC Lab Results  Component Value Date   WBC 9.6 03/29/2024   RBC 4.34 03/29/2024   HGB 13.4 03/29/2024   HCT 41.3 03/29/2024   MCV 95.2 03/29/2024   MCH 30.9 03/29/2024   PLT 224 03/29/2024   MCHC 32.4 03/29/2024   RDW 14.8 03/29/2024   LYMPHSABS 1.1 03/28/2024   MONOABS 0.7 03/28/2024    EOSABS 0.1 03/28/2024   BASOSABS 0.1 03/28/2024     Last metabolic panel Lab Results  Component Value Date   NA 141 03/29/2024   K 4.1 03/29/2024   CL 101 03/29/2024   CO2 26  03/29/2024   BUN 44 (H) 03/29/2024   CREATININE 1.73 (H) 03/29/2024   GLUCOSE 104 (H) 03/29/2024   GFRNONAA 28 (L) 03/29/2024   GFRAA 23 (L) 10/11/2019   CALCIUM  9.3 03/29/2024   PHOS 3.4 03/29/2024   PROT 6.9 03/29/2024   ALBUMIN  3.6 03/29/2024   BILITOT 0.5 03/29/2024   ALKPHOS 79 03/29/2024   AST 19 03/29/2024   ALT 10 03/29/2024   ANIONGAP 14 03/29/2024    CBG (last 3)  Recent Labs    03/29/24 0338 03/29/24 0731 03/29/24 1155  GLUCAP 107* 106* 116*      Coagulation Profile: No results for input(s): INR, PROTIME in the last 168 hours.   Radiology Studies: I have personally reviewed the imaging studies  DG Chest Port 1 View Result Date: 03/28/2024 CLINICAL DATA:  Lower extremity swelling.  Fell 5 weeks ago. EXAM: PORTABLE CHEST 1 VIEW COMPARISON:  12/28/2021 FINDINGS: Shallow inspiration with atelectasis in the lung bases. Cardiac enlargement with mild vascular congestion. No obvious edema or consolidation. No pleural effusion or pneumothorax. Mediastinal contours appear intact. Calcified and tortuous aorta. Degenerative changes in the spine and shoulders. IMPRESSION: Shallow inspiration with atelectasis in the lung bases. Cardiac enlargement with pulmonary vascular congestion. No edema. Electronically Signed   By: Elsie Gravely M.D.   On: 03/28/2024 17:17       Shama Monfils M.D. [facility] 03/29/2024, 1:57 PM  Available via Epic secure chat 7am-7pm After 7 pm, please refer to night coverage provider listed on amion.

## 2024-03-29 NOTE — TOC Initial Note (Addendum)
 Transition of Care [facility]) - Initial/Assessment Note   Patient Details  Name: Pamela [name] MRN: [id number] Date of Birth: 25-Nov-1934  Transition of Care [facility]) CM/SW Contact:    Duwaine GORMAN Aran, LCSW Phone Number: 03/29/2024, 3:36 PM  Clinical Narrative: PT evaluation recommended SNF. Patient reported she does not want to go to SNF and would prefer to go home with [facility] services, but would like to see how she progresses with PT before agreeing to [facility]. CHF screening consult cleared as heart failure navigation team has signed off. Care management to follow.  Expected Discharge Plan: Home w Home Health Services Barriers to Discharge: Continued Medical Work up  Patient Goals and CMS Choice Patient states their goals for this hospitalization and ongoing recovery are:: Go home with [facility] if possible CMS Medicare.gov Compare Post Acute Care list provided to:: Patient Choice offered to / list presented to : Patient  Expected Discharge Plan and Services In-house Referral: Clinical Social Work Post Acute Care Choice: Home Health Living arrangements for the past 2 months: Single Family Home             DME Arranged: N/A DME Agency: NA  Prior Living Arrangements/Services Living arrangements for the past 2 months: Single Family Home Lives with:: Spouse Patient language and need for interpreter reviewed:: Yes Do you feel safe going back to the place where you live?: Yes      Need for Family Participation in Patient Care: No (Comment) Care giver support system in place?: Yes (comment) Criminal Activity/Legal Involvement Pertinent to Current Situation/Hospitalization: No - Comment as needed  Activities of Daily Living ADL Screening (condition at time of admission) Independently performs ADLs?: No Does the patient have a NEW difficulty with bathing/dressing/toileting/self-feeding that is expected to last >3 days?: Yes (Initiates electronic notice to provider for possible OT consult) Does the patient have  a NEW difficulty with getting in/out of bed, walking, or climbing stairs that is expected to last >3 days?: Yes (Initiates electronic notice to provider for possible PT consult) Does the patient have a NEW difficulty with communication that is expected to last >3 days?: No Is the patient deaf or have difficulty hearing?: No Does the patient have difficulty seeing, even when wearing glasses/contacts?: No Does the patient have difficulty concentrating, remembering, or making decisions?: No  Permission Sought/Granted Permission sought to share information with : Other (comment) Permission granted to share information with : Yes, Verbal Permission Granted Permission granted to share info w AGENCY: HH agencies  Emotional Assessment Attitude/Demeanor/Rapport: Engaged Affect (typically observed): Accepting Orientation: : Oriented to Self, Oriented to Place, Oriented to  Time, Oriented to Situation Alcohol / Substance Use: Not Applicable Psych Involvement: No (comment)  Admission diagnosis:  Hypoxia [R09.02] General weakness [R53.1] CHF exacerbation (HCC) [I50.9] Bilateral lower extremity edema [R60.0] Ambulatory dysfunction [R26.2] Patient Active Problem List   Diagnosis Date Noted   Prolonged QT interval 03/29/2024   History of pulmonary embolism 03/29/2024   Debility 03/29/2024   Venous stasis dermatitis 03/29/2024   Elevated troponin 03/29/2024   Coronary artery calcification seen on CAT scan 03/29/2024   Pure hypercholesterolemia 03/29/2024   PAD (peripheral artery disease) 03/29/2024   CHF exacerbation (HCC) 03/28/2024   Acute on chronic heart failure with mildly reduced ejection fraction (HFmrEF, 41-49%) (HCC) 12/29/2021   Hypertensive emergency 12/29/2021   CAP (community acquired pneumonia) 12/29/2021   Left knee pain 12/29/2021   Fall at home, initial encounter 12/29/2021   Elevated d-dimer 12/29/2021   PAF (paroxysmal atrial  fibrillation) (HCC)    Acute CHF (congestive  heart failure) (HCC) 12/28/2021   Pulmonary embolism (HCC) 05/01/2021   Bilateral pulmonary embolism (HCC) 04/30/2021   Acute pulmonary embolism (HCC)    COVID-19 virus infection    Acute diastolic CHF (congestive heart failure) (HCC) 10/11/2019   Pulmonary edema 10/09/2019   Acute respiratory failure with hypoxia (HCC) 10/09/2019   Duodenal diverticulum, periampullary 11/03/2018   Diverticulosis of sigmoid colon 11/03/2018   Intestine with microperforation (Sigmoid vs Meckel's) 11/03/2018   Common bile duct stone 06/22/2017   Mixed hyperlipidemia 06/22/2017   CKD (chronic kidney disease) stage 3, GFR 30-59 ml/min (HCC) 06/22/2017   Chronic pain 06/22/2017   Obesity, Class III, BMI 40-49.9 (morbid obesity) 06/22/2017   Seizures (HCC) 06/22/2017   Type 2 diabetes mellitus, without long-term current use of insulin  (HCC) 06/22/2017   RUQ pain    Bloating 11/04/2014   Hx of adenomatous colonic polyps 2016    Bilateral lower extremity edema 09/10/2013   Essential hypertension 09/10/2013   PCP:  [name], MD Pharmacy:   [id number] [address] [place], [place] - [address] [place] [address] Phone: [phone] Fax: [phone]  [facility] [address] [place] [address] Phone: [phone] Fax: [phone]  [facility] [address] [place] [address] Phone: [phone] Fax: [phone]  Social Drivers of Health (SDOH) Social History: SDOH Screenings   Food Insecurity: No Food Insecurity (03/29/2024)  Housing: Low Risk  (03/29/2024)  Transportation Needs: No Transportation Needs (03/29/2024)  Utilities: Not At Risk (03/29/2024)  Social Connections: Unknown (03/29/2024)  Recent Concern: Social Connections - Moderately Isolated (03/29/2024)  Tobacco Use: Medium Risk (03/29/2024)   SDOH Interventions:    Readmission Risk Interventions     No data to display

## 2024-03-29 NOTE — Progress Notes (Signed)
 Initial Nutrition Assessment  DOCUMENTATION CODES:   Morbid obesity  INTERVENTION:   Ensure Plus High Protein po BID, each supplement provides 350 kcal and 20 grams of protein Prosource Plus 30 ml PO once daily, each packet provides 100 kcal and 15 gm protein MVI with minerals daily  NUTRITION DIAGNOSIS:   Inadequate oral intake related to acute illness as evidenced by per patient/family report.  GOAL:   Patient will meet greater than or equal to 90% of their needs  MONITOR:   PO intake, Supplement acceptance, Skin  REASON FOR ASSESSMENT:   Consult Assessment of nutrition requirement/status  ASSESSMENT:   89 yo female admitted with acute on chronic CHF, debility, acute respiratory failure. PMH includes BLE edema, HTN, DM, obesity, CKD stage 3, osteopenia, diverticulosis, GERD, CHF, PAF.  Per chart review, patient had a fall 5 weeks PTA and has been mainly sitting in her recliner at home since then. She developed worsening SOB and weakness and could not get out of the recliner. She has had leg pain and swelling PTA.  Patient is currently out of the room. Called and spoke with patient's husband who reports that patient is not a big eater, but that she eats enough to sustain her. She has a good appetite, but just doesn't eat a lot. They typically eat 1-2 meals and 1-2 snacks every day. She takes a fiber supplement at home, but no other supplements or MVI. From review of usual intake with husband, suspect protein intake has been less than optimal.  Weight history reviewed. No significant weight loss since last year, however, current edema could be masking weight loss.   Labs reviewed. A1C 5.7 CBG: 106-116  Medications reviewed and include lasix , novolog .  NUTRITION - FOCUSED PHYSICAL EXAM:  Unable to complete at this time  Diet Order:   Diet Order             Diet heart healthy/carb modified Room service appropriate? Yes; Fluid consistency: Thin  Diet effective now                    EDUCATION NEEDS:   Not appropriate for education at this time  Skin:  Skin Assessment: Skin Integrity Issues: Skin Integrity Issues:: Other (Comment) Other: BLE cellulitis  Last BM:  9/26 type 4, type 7  Height:   Ht Readings from Last 1 Encounters:  03/29/24 5' 2 (1.575 m)    Weight:   Wt Readings from Last 1 Encounters:  03/29/24 115.9 kg    Ideal Body Weight:  50 kg  BMI:  Body mass index is 46.73 kg/m.  Estimated Nutritional Needs:   Kcal:  1650-1850  Protein:  100-115 gm  Fluid:  1.6-1.8 L   Suzen HUNT RD, LDN, CNSC Contact via secure chat. If unavailable, use group chat RD Inpatient.

## 2024-03-29 NOTE — Progress Notes (Signed)
 Per Dr. Dorine request, I reached out to vascular surgery on call and spoke with Dr. Gretta re: 02/2023 CT findings. He feels the abnormalities seen in the abdominal aorta likely reflect chronic diseased state of the aorta, no acute findings, and do not preclude anticoagulation. Given age and comorbidities, he did not feel this specific finding needed to be followed or intervened upon.

## 2024-03-29 NOTE — Assessment & Plan Note (Signed)
 States her Eliquis  was discontinued it is unclear why denies any bleeding given history of A-fib patient would likely benefit from restarting anticoagulation Given new tachycardia and oxygen  requirement will obtain CTA

## 2024-03-29 NOTE — Assessment & Plan Note (Signed)
 -  Order Sensitive  SSI     -  check TSH and HgA1C  - Hold by mouth medications*

## 2024-03-29 NOTE — Progress Notes (Signed)
 Heart Failure Navigator Progress Note  Assessed for Heart & Vascular TOC clinic readiness.  Patient does not meet criteria due to per MD note patient is essentially bed bound. No HF TOC. .   Navigator will sign off at this time.   Stephane Haddock, BSN, Scientist, clinical (histocompatibility and immunogenetics) Only

## 2024-03-29 NOTE — Evaluation (Signed)
 Physical Therapy Evaluation Patient Details Name: Pamela [name] MRN: [id number] DOB: December 08, 1934 Today's Date: 03/29/2024  History of Present Illness  patient is a 89 year old female who presented from home 03/28/24 with inability to stand and pivot. . Patient has had 2 falls and has been nonambulatory x ~ 5 weeks.Patient was admitted  chronic systolic CHF., hypoxia.[facility] PMHC: HF, arthritis of L knee, hypertension, T2DM, HFrEF, CKD stage IIIb, hyperlipidemia and pulmonary embolism  Clinical Impression  Pt admitted with above diagnosis.  Pt currently with functional limitations due to the deficits listed below (see PT Problem List). Pt will benefit from acute skilled PT to increase their independence and safety with mobility to allow discharge.       The patient is very motivated but anxious  about mobility.  Patient reports getting very shaky with any attempts to stand.  Patient  did stan x 3 with +2 max support in  sara stedy lift. Patient with shaking  when standing, decreases when diverted with talking about family  The patient has been  transfers only  with spouse assistance x ~ 5 weeks since falls. Patient reports just could not stand and had to  come to [place].  Patient has strong desire to return home but will need to be transfers with spouse assisting. Did discuss options of HHPT vs SNF. Patient will benefit from continued inpatient follow up therapy, <3 hours/day     If plan is discharge home, recommend the following: Two people to help with walking and/or transfers;A lot of help with bathing/dressing/bathroom;Assistance with cooking/housework;Assist for transportation;Help with stairs or ramp for entrance   Can travel by private vehicle   No    Equipment Recommendations None recommended by PT  Recommendations for Other Services       Functional Status Assessment Patient has had a recent decline in their functional status and demonstrates the ability to make significant improvements in  function in a reasonable and predictable amount of time.     Precautions / Restrictions Precautions Precautions: Fall Precaution/Restrictions Comments: monitor sats      Mobility  Bed Mobility Overal bed mobility: Needs Assistance Bed Mobility: Supine to Sit, Sit to Supine     Supine to sit: Min assist, Used rails, HOB elevated Sit to supine: Mod assist, +2 for physical assistance, +2 for safety/equipment   General bed mobility comments: patient able to sit forward in bed, move legs to bed edge, use of rail  to assist to slide around  to bed edge    Transfers Overall transfer level: Needs assistance   Transfers: Sit to/from Stand             General transfer comment: max +2 to power up to stand from raised bed, noted body shaking, arms and legs, stood only a few secs then sat down.  max Assisted again to stand   for ~ 30 secs, seated rest  and then assisted again  to stand x~ 20 secs, Patient with less shaking when distracted  talking about family Transfer via Lift Equipment: Stedy  Ambulation/Gait                  Stairs            Wheelchair Mobility     Tilt Bed    Modified Rankin (Stroke Patients Only)       Balance Overall balance assessment: Needs assistance Sitting-balance support: Feet supported, No upper extremity supported Sitting balance-Leahy Scale: Fair  Standing balance support: Bilateral upper extremity supported, During functional activity, Reliant on assistive device for balance Standing balance-Leahy Scale: Poor                               Pertinent Vitals/Pain Pain Assessment Pain Assessment: Faces Faces Pain Scale: Hurts little more Pain Location: right leg Pain Descriptors / Indicators: Discomfort Pain Intervention(s): Monitored during session, Premedicated before session    Home Living Family/patient expects to be discharged to:: Private residence Living Arrangements: Spouse/significant  other Available Help at Discharge: Family;Available 24 hours/day Type of Home: House Home Access: Stairs to enter   [employer] of Steps: 1   Home Layout: One level Home Equipment: Agricultural consultant (2 wheels);Rollator (4 wheels);Wheelchair - manual;Shower seat Additional Comments: 3 wheeled walker    Prior Function Prior Level of Function : Needs assist             Mobility Comments: has not amb. x 5 weeks, spouse assisting transfers from lift reclioner to [facility], rolls to shower and transfers to seat. ADLs Comments: spouse has been assisting with all ADL's     Extremity/Trunk Assessment        Lower Extremity Assessment Lower Extremity Assessment: LLE deficits/detail;RLE deficits/detail RLE Deficits / Details: lower leg discoloration, edema, 3+/5 strength LLE Deficits / Details: lower leg discoloration, 3+/5 strength    Cervical / Trunk Assessment Cervical / Trunk Assessment: Normal;Other exceptions Cervical / Trunk Exceptions: body habitus  Communication   Communication Communication: No apparent difficulties    Cognition Arousal: Alert Behavior During Therapy: WFL for tasks assessed/performed   PT - Cognitive impairments: No apparent impairments                         Following commands: Intact       Cueing Cueing Techniques: Verbal cues     General Comments      Exercises     Assessment/Plan    PT Assessment Patient needs continued PT services  PT Problem List Decreased strength;Decreased coordination;Decreased range of motion;Decreased cognition;Decreased activity tolerance;Decreased mobility;Decreased skin integrity       PT Treatment Interventions DME instruction;Therapeutic exercise;Gait training;Functional mobility training;Patient/family education    PT Goals (Current goals can be found in the Care Plan section)  Acute Rehab PT Goals Patient Stated Goal: go home PT Goal Formulation: With patient/family Time For Goal  Achievement: 04/12/24 Potential to Achieve Goals: Fair    Frequency Min 3X/week     Co-evaluation               AM-PAC PT 6 Clicks Mobility  Outcome Measure Help needed turning from your back to your side while in a flat bed without using bedrails?: A Lot Help needed moving from lying on your back to sitting on the side of a flat bed without using bedrails?: A Lot Help needed moving to and from a bed to a chair (including a wheelchair)?: Total Help needed standing up from a chair using your arms (e.g., wheelchair or bedside chair)?: Total Help needed to walk in hospital room?: Total Help needed climbing 3-5 steps with a railing? : Total 6 Click Score: 8    End of Session   Activity Tolerance: Patient tolerated treatment well Patient left: in bed;with call bell/phone within reach;with bed alarm set Nurse Communication: Mobility status PT Visit Diagnosis: Unsteadiness on feet (R26.81);Other abnormalities of gait and mobility (R26.89);Repeated falls (R29.6);Muscle weakness (  generalized) (M62.81);Pain Pain - Right/Left: Right Pain - part of body: Knee    Time: 8851-8769 PT Time Calculation (min) (ACUTE ONLY): 42 min   Charges:   PT Evaluation $PT Eval Low Complexity: 1 Low PT Treatments $Therapeutic Activity: 23-37 mins PT General Charges $$ ACUTE PT VISIT: 1 Visit         Darice Potters PT Acute Rehabilitation Services Office [phone]   Potters Darice Norris 03/29/2024, 1:37 PM

## 2024-03-29 NOTE — Assessment & Plan Note (Signed)
-  chronic avoid nephrotoxic medications such as NSAIDs, Vanco Zosyn combo,  avoid hypotension, continue to follow renal function

## 2024-03-29 NOTE — Consult Note (Signed)
 Cardiology Consultation   Patient ID: Pamela [name] MRN: [id number]; DOB: [birth year]  Admit date: 03/28/2024 Date of Consult: 03/29/2024  PCP:  [name], MD   [facility] Providers Cardiologist:  Dorn Lesches, MD        Patient Profile: Pamela [name] is a 89 y.o. female with a hx of PAF, HTN, DM, former tobacco use, HLD, chronic HFmrEF, bilateral PE in 2022 with RV strain, CKD stage 3b, LBBB, diverticulosis, pulmonary nodule, ectatic infrarenal aorta (2.7cm in 02/2023), aortic atherosclerosis, coronary calcification on CT who is being seen 03/29/2024 for the evaluation of PAF at the request of Dr. Kelley.  History of Present Illness:  Ms. Pamela [name] has had prior admissions for diastolic HF associated with hypoxia requiring diuresis. She has history of significant LE edema as well as cellulitis. In 2022 she was admitted with acute respiratory failure felt secondary to submassive PE and a/c CHF coming off recent Covid infection the month prior. Echo at that time showed EF 55-60% with RV dysfunction/pressure/volume overload. She was diuresed and started on anticoagulation. Limited echo 07/2021 showed EF 45-50% with normalized RV function. She was readmitted 01/2022 with a/c HF and also found to have Afib with RVR. She was no longer on anticoagulation at that time so it was resumed. (The patient recalled being told she had taken all she needed to after the blood clot, not that it had been stopped for any problems). Metoprolol  dose was increased. She spontaneously converted to NSR. EF remained 45-50% during that admission. She was last seen in the office by Dr. Lesches 02/2022 at which time she had not restarted Eliquis  at home yet and was encouraged to do so. LE venous duplex for return of edema was negative for DVT at that time. She declined to schedule follow-up in 2024. Primary care notes under CareEverywhere indicate the patient was previously reminded of recommendation for  anticoagulation but she has declined. There is also notation that she preferred to stop metoprolol  in the past but the patient does not recall this. She doesn't recall ever having a bad reaction or significant bleeding with either of these medicines. Of note, CT abd/pelvis in 02/2023 during ER visit for abdominal pain noted multiple ancillary findings including patchy areas of peripheral intraluminal thrombus/hematomas in the abdominal aorta most pronounced at the level of diaphragmatic hiatus on the left side, 5.3x7.47mm unchanged RLL nodule, nonocclusive filling defect felt mucous/secretion, mild hepatic steatosis, multiple kidney cysts, 2.7cm infrarenal aorta, aortic atherosclerosis.   She was brought to [facility] by EMS yesterday with pain in her legs and difficulty standing. She had a fall 5 weeks ago and since that time had had worsening swelling, redness, and pain in her legs. She has basically been confined to a recliner. Her husband has been bringing a Engineer, manufacturing systems to her recliner and helping with transfers, and helping her bathe. Several weeks ago she was started on low dose Lasix  20mg  daily by primary care (previously PRN) as well as antibiotics by a [facility] virtual visit without significant relief. She was noted to be hypoxic in the 80s requiring 4L [place] in the ED. Labs show pBNP 698, hsTroponin low/flat at 39-37, pro-cal neg, Cr 1.62->1.73 (variable baseline - 1.2-2.2), ?d-dimer, TSH wnl, Covid/flu/RSV neg. EKGs show NSR 98-101bpm, occasional PVC, LBBB, nonspecific STTW changes. CXR shows cardiac enlargement with pulmonary vascular congestion; no edema. She was given 40mg  IV Lasix  and written to start 40mg  IV BID. She reports excellent UOP thus far. Denies CP, SOB, palpitations.  Past Medical History:  Diagnosis Date   Arthritis    Bilateral lower extremity edema    left lower extremity cellulitis   Chronic heart failure with mildly reduced ejection fraction (HFmrEF, 41-49%) (HCC)    Common bile  duct stone 06/22/2017   Diabetes (HCC)    Diverticulosis    Duodenal diverticulum, periampullary 11/03/2018   Aborted ERCP 2016   GERD (gastroesophageal reflux disease)    History of hiatal hernia    Hypertension    Kidney disease, chronic, stage III (GFR 30-59 ml/min) (HCC)    Obesity    Osteopenia    PAF (paroxysmal atrial fibrillation) (HCC)    Seizures (HCC) 22 yrs ago   no cause found   Tubular adenoma of colon 2009    Past Surgical History:  Procedure Laterality Date   ABDOMINAL HYSTERECTOMY     APPENDECTOMY     CHOLECYSTECTOMY OPEN  1977   COLONOSCOPY WITH PROPOFOL  N/A 10/16/2014   Procedure: COLONOSCOPY WITH PROPOFOL ;  Surgeon: Gwendlyn ONEIDA Buddy, MD;  Location: [facility] ENDOSCOPY;  Service: Endoscopy;  Laterality: N/A;   ENDOSCOPIC RETROGRADE CHOLANGIOPANCREATOGRAPHY (ERCP) WITH PROPOFOL  N/A 06/02/2017   Procedure: ENDOSCOPIC RETROGRADE CHOLANGIOPANCREATOGRAPHY (ERCP) WITH PROPOFOL ;  Surgeon: Buddy Gwendlyn ONEIDA, MD;  Location: [facility] ENDOSCOPY;  Service: Endoscopy;  Laterality: N/A;   TONSILLECTOMY       Home Medications:  Prior to Admission medications   Medication Sig Start Date End Date Taking? Authorizing Provider  acetaminophen  (TYLENOL  8 HOUR ARTHRITIS PAIN) 650 MG CR tablet Take 2 tablets by mouth in the morning and at bedtime.    [provider]  ALPRAZolam  (XANAX ) 0.25 MG tablet Take 0.125-0.25 mg by mouth 2 (two) times daily as needed. 01/13/22   [provider]  amLODipine (NORVASC) 2.5 MG tablet Take 2.5 mg by mouth daily. 02/01/22   [provider]  apixaban  (ELIQUIS ) 5 MG TABS tablet Take 1 tablet (5 mg total) by mouth 2 (two) times daily. 02/16/22   Court Dorn PARAS, MD  calcium  carbonate (OSCAL) 1500 (600 Ca) MG TABS tablet Take 1,500 mg by mouth 2 (two) times daily with a meal.    [provider]  dapagliflozin  propanediol (FARXIGA ) 10 MG TABS tablet Take 10 mg by mouth daily.    [provider]  diclofenac  Sodium (VOLTAREN ) 1  % GEL Apply 2 g topically 4 (four) times daily. 01/03/22   Sheikh, Omair Latif, DO  furosemide  (LASIX ) 20 MG tablet Take 1 tablet (20 mg total) by mouth daily as needed for edema (or weight gain). 05/06/21 05/06/22  Fausto Sor A, DO  K Phos Mono-Sod Phos Di & Mono (PHOSPHA 250 NEUTRAL) 155-852-130 MG TABS Take 2 tablets by mouth daily. 01/31/22   [provider]  Lancets [facility] DELICA PLUS [place]) MISC  07/09/21   [provider]  lovastatin (MEVACOR) 20 MG tablet Take 20 mg by mouth at bedtime.    [provider]  OVER THE COUNTER MEDICATION Take 1 capsule by mouth daily. Daily fiber 100% psyllium. Patient not taking: Reported on 02/16/2022    [provider]  Probiotic Product (ALIGN) 4 MG CAPS Take 4 mg by mouth daily.    [provider]  traZODone  (DESYREL ) 50 MG tablet Take 50 mg by mouth at bedtime as needed for sleep.    [provider]    Scheduled Meds:  enoxaparin  (LOVENOX ) injection  30 mg Subcutaneous Q24H   furosemide   40 mg Intravenous Q12H   insulin  aspart  0-9 Units  Subcutaneous Q4H   pravastatin   20 mg Oral q1800   sodium chloride  flush  3 mL Intravenous Q12H   Continuous Infusions:  sodium chloride      PRN Meds: sodium chloride , acetaminophen  **OR** acetaminophen , fentaNYL  (SUBLIMAZE ) injection, HYDROcodone -acetaminophen , mouth rinse, sodium chloride  flush, traZODone   Allergies:    Allergies  Allergen Reactions   Levaquin [Levofloxacin In D5w] Nausea And Vomiting   Lisinopril Cough    Social History:   Social History   Socioeconomic History   Marital status: Married    Spouse name: Not on file   Number of children: Not on file   Years of education: Not on file   Highest education level: Not on file  Occupational History   Not on file  Tobacco Use   Smoking status: Former    Current packs/day: 1.00    Average packs/day: 1 pack/day for 20.0 years (20.0 ttl pk-yrs)    Types: Cigarettes   Smokeless  tobacco: Never   Tobacco comments:    quit smoking 30 yrs ago  Vaping Use   Vaping status: Never Used  Substance and Sexual Activity   Alcohol use: No   Drug use: No   Sexual activity: Not on file  Other Topics Concern   Not on file  Social History Narrative   Not on file   Social Drivers of Health   Financial Resource Strain: Not on file  Food Insecurity: No Food Insecurity (03/29/2024)   Hunger Vital Sign    Worried About Running Out of Food in the Last Year: Never true    Ran Out of Food in the Last Year: Never true  Transportation Needs: No Transportation Needs (03/29/2024)   PRAPARE - Administrator, Civil Service (Medical): No    Lack of Transportation (Non-Medical): No  Physical Activity: Not on file  Stress: Not on file  Social Connections: Unknown (03/29/2024)   Social Connection and Isolation Panel    Frequency of Communication with Friends and Family: Not on file    Frequency of Social Gatherings with Friends and Family: Not on file    Attends Religious Services: Not on file    Active Member of Clubs or Organizations: Not on file    Attends Banker Meetings: Never    Marital Status: Not on file  Recent Concern: Social Connections - Moderately Isolated (03/29/2024)   Social Connection and Isolation Panel    Frequency of Communication with Friends and Family: More than three times a week    Frequency of Social Gatherings with Friends and Family: More than three times a week    Attends Religious Services: Never    Database administrator or Organizations: No    Attends Engineer, structural: Not on file    Marital Status: Married  Catering manager Violence: Not At Risk (03/29/2024)   Humiliation, Afraid, Rape, and Kick questionnaire    Fear of Current or Ex-Partner: No    Emotionally Abused: No    Physically Abused: No    Sexually Abused: No    Family History:   Family History  Problem Relation Age of Onset   Dementia Sister     Hypertension Brother      ROS:  Please see the history of present illness.  + snoring, no prior OSA eval - husband reports she doesn't snore as bad now as she used to All other ROS reviewed and negative.     Physical Exam/Data: Vitals:   03/28/24 2315 03/29/24  0055 03/29/24 0118 03/29/24 0458  BP: (!) 152/97 (!) 155/89  (!) 150/77  Pulse: (!) 103 100  100  Resp: 20 20  18   Temp: 98 F (36.7 C) 97.8 F (36.6 C)  98.2 F (36.8 C)  TempSrc: Oral   Oral  SpO2: 93% 95%  97%  Weight:   115.9 kg   Height:   5' 2 (1.575 m)     Intake/Output Summary (Last 24 hours) at 03/29/2024 0834 Last data filed at 03/29/2024 0800 Gross per 24 hour  Intake 243 ml  Output 1200 ml  Net -957 ml      03/29/2024    1:18 AM 02/23/2023    9:22 AM 02/16/2022   10:29 AM  Last 3 Weights  Weight (lbs) 255 lb 8.2 oz 259 lb 11.2 oz 259 lb 9.6 oz  Weight (kg) 115.9 kg 117.8 kg 117.754 kg     Body mass index is 46.73 kg/m.  General: Well developed, well nourished, in no acute distress. Head: Normocephalic, atraumatic, sclera non-icteric, no xanthomas, nares are without discharge. Neck: Negative for carotid bruits. JVP not elevated. Lungs: Decreased BS bilaterally R>L with L basilar crackles. No wheezing or rhonchi. Breathing is unlabored. Heart: borderline tachycardic, regular, S1 S2 without murmurs, rubs, or gallops.  Abdomen: Soft, non-tender, non-distended with normoactive bowel sounds. No rebound/guarding. Extremities: No clubbing or cyanosis. 1+ BLE edema with marked BLE erythema, warmth Neuro: Alert and oriented X 3. Moves all extremities spontaneously. Psych:  Responds to questions appropriately with a normal affect.   EKG:  The EKG was personally reviewed and demonstrates:  NSR 98-101bpm, occasional PVC, LBBB, nonspecific STTW changes Telemetry:  Telemetry was personally reviewed and demonstrates:  NSR/borderline ST rare PVC  Relevant CV Studies: 2d echo 12/2021   1. Left ventricular ejection  fraction, by estimation, is 45 to 50%. The  left ventricle has mildly decreased function. The left ventricle  demonstrates global hypokinesis. There is mild left ventricular  hypertrophy. Left ventricular diastolic parameters  are consistent with Grade II diastolic dysfunction (pseudonormalization).   2. Right ventricular systolic function is normal. The right ventricular  size is normal.   3. The mitral valve is normal in structure. No evidence of mitral valve  regurgitation. No evidence of mitral stenosis. Moderate mitral annular  calcification.   4. The aortic valve is normal in structure. Aortic valve regurgitation is  not visualized. No aortic stenosis is present.   5. The inferior vena cava is normal in size with greater than 50%  respiratory variability, suggesting right atrial pressure of 3 mmHg.    Laboratory Data: High Sensitivity Troponin:  No results for input(s): TROPONINIHS in the last 720 hours.   Chemistry Recent Labs  Lab 03/28/24 1753 03/28/24 2140 03/29/24 0111  NA  --  141 141  K  --  4.2 4.1  CL  --  103 101  CO2  --  25 26  GLUCOSE  --  107* 104*  BUN  --  43* 44*  CREATININE  --  1.62* 1.73*  CALCIUM   --  9.1 9.3  MG 2.3  --  2.2  GFRNONAA  --  30* 28*  ANIONGAP  --  13 14    Recent Labs  Lab 03/29/24 0111  PROT 6.9  ALBUMIN  3.6  AST 19  ALT 10  ALKPHOS 79  BILITOT 0.5   Lipids No results for input(s): CHOL, TRIG, HDL, LABVLDL, LDLCALC, CHOLHDL in the last 168 hours.  Hematology Recent Labs  Lab 03/28/24 1602 03/29/24 0111  WBC 9.7 9.6  RBC 4.59 4.34  HGB 13.6 13.4  HCT 43.9 41.3  MCV 95.6 95.2  MCH 29.6 30.9  MCHC 31.0 32.4  RDW 14.8 14.8  PLT 233 224   Thyroid   Recent Labs  Lab 03/29/24 0111  TSH 3.140    BNP Recent Labs  Lab 03/28/24 1753  PROBNP 698.0*    DDimer  Recent Labs  Lab 03/29/24 0111  DDIMER 1.68*    Radiology/Studies:  DG Chest Port 1 View Result Date: 03/28/2024 CLINICAL DATA:   Lower extremity swelling.  Fell 5 weeks ago. EXAM: PORTABLE CHEST 1 VIEW COMPARISON:  12/28/2021 FINDINGS: Shallow inspiration with atelectasis in the lung bases. Cardiac enlargement with mild vascular congestion. No obvious edema or consolidation. No pleural effusion or pneumothorax. Mediastinal contours appear intact. Calcified and tortuous aorta. Degenerative changes in the spine and shoulders. IMPRESSION: Shallow inspiration with atelectasis in the lung bases. Cardiac enlargement with pulmonary vascular congestion. No edema. Electronically Signed   By: Elsie Gravely M.D.   On: 03/28/2024 17:17     Assessment and Plan:  1. Acute on chronic respiratory failure in setting of acute on chronic HFmrEF - patient has had similar presentations, often dropping O2 sat in setting of volume retention requiring diuresis -> cannot exclude underlying component of OHS/OSA so will need formal eval for home O2 at dc - anticipate she may do better on torsemide at dc given suspected component of concomitant venous insufficiency - continue plan for IV Lasix  40mg  BID - await echocardiogram - agree with PE r/o with VQ by primary team  2. Possible BLE cellulitis - antibiotic management per primary team  3. History of PAF - noted in 2023, seems quiescent since then - no longer on Eliquis  or metoprolol  per patient preference per chart - she is amenable to revisiting both if needed - will discuss CT findings from 02/2023 with MD (?patchy areas of intraluminal thrombus/hematomas in abdominal aorta) with regard to resumption - here, telemetry shows NSR/ST with occasional PVCs without any recurrent AF/AFL though clinically she is at increased risk for recurrence with age, comorbidities, and habitus  4. Mildly elevated troponin superimposed on coronary/aortic atherosclerosis on prior CT imaging - low/flat, no CP or SOB, do not suspect ACS - no prior ischemic eval for decreased EF - given comorbidities, essentially  bedbound status, CKD, and lack of angina, anticipate conservative symptom-based approach rather than invasive procedures where risk outweighs benefit  5. CKD stage 3b - Cr appears variable over several years' time, she may need to run a slightly higher Cr to be drier. Follows with Dr. Tobie with nephrology as OP - follow with diuresis  6. PAD - prior CT imaging has shown atherosclerotic calcifications and infrarenal aortic ectasia as well as patchy areas of peripheral intraluminal thrombus/hematomas in the abdominal aorta - follow clinically - resumption of home lipid mgmt per primary team  7. Occasional PVCs - will discuss BB initiation with MD - follow daily electrolytes with diuresis  Risk Assessment/Risk Scores:       [place]  Heart Association (NYHA) Functional Class NYHA Class III  CHA2DS2-VASc Score = 7   This indicates a 11.2% annual risk of stroke. The patient's score is based upon: CHF History: 1 HTN History: 1 Diabetes History: 1 Stroke History: 0 Vascular Disease History: 1 (aortic/coronary atherosclerosis) Age Score: 2 Gender Score: 1      For questions or updates, please contact [facility] Please consult  [email or web address] for contact info under      Signed, Rockell Faulks N Elpidio Thielen, PA-C  03/29/2024 8:34 AM

## 2024-03-29 NOTE — Assessment & Plan Note (Signed)
-  will monitor on tele avoid QT prolonging medications, rehydrate correct electrolytes ? ?

## 2024-03-29 NOTE — Assessment & Plan Note (Signed)
 Patient states she is no longer taking Eliquis  Has not followed back of cardiology Will obtain echogram Patient most likely would benefit from cardiology consult given history of systolic CHF A-fib and lost to follow-up also it is unclear why she has been taken off of anticoagulation

## 2024-03-29 NOTE — Assessment & Plan Note (Signed)
 this patient has acute respiratory failure with Hypoxia a  as documented by the presence of following: O2 saturatio< 90% on RA  Likely due to:   CHF exacerbation,   Provide O2 therapy and titrate as needed  Continuous pulse ox   check Pulse ox with ambulation prior to discharge   may need  TC consult for home O2 set up    flutter valve ordered

## 2024-03-29 NOTE — Assessment & Plan Note (Signed)
Continue lovastatin 20 mg daily. 

## 2024-03-29 NOTE — Assessment & Plan Note (Signed)
 Patient would benefit from long-term follow-up with wound care as well as improvement of fluid status

## 2024-03-30 ENCOUNTER — Inpatient Hospital Stay (HOSPITAL_COMMUNITY)

## 2024-03-30 DIAGNOSIS — I509 Heart failure, unspecified: Secondary | ICD-10-CM | POA: Diagnosis not present

## 2024-03-30 DIAGNOSIS — R7989 Other specified abnormal findings of blood chemistry: Secondary | ICD-10-CM | POA: Diagnosis not present

## 2024-03-30 DIAGNOSIS — I10 Essential (primary) hypertension: Secondary | ICD-10-CM | POA: Diagnosis not present

## 2024-03-30 DIAGNOSIS — I5033 Acute on chronic diastolic (congestive) heart failure: Secondary | ICD-10-CM

## 2024-03-30 DIAGNOSIS — R600 Localized edema: Secondary | ICD-10-CM | POA: Diagnosis not present

## 2024-03-30 DIAGNOSIS — R262 Difficulty in walking, not elsewhere classified: Secondary | ICD-10-CM | POA: Diagnosis not present

## 2024-03-30 DIAGNOSIS — N179 Acute kidney failure, unspecified: Secondary | ICD-10-CM

## 2024-03-30 DIAGNOSIS — I480 Paroxysmal atrial fibrillation: Secondary | ICD-10-CM | POA: Diagnosis not present

## 2024-03-30 DIAGNOSIS — R0902 Hypoxemia: Secondary | ICD-10-CM

## 2024-03-30 DIAGNOSIS — R531 Weakness: Secondary | ICD-10-CM | POA: Diagnosis not present

## 2024-03-30 LAB — RENAL FUNCTION PANEL
Albumin: 3.4 g/dL — ABNORMAL LOW (ref 3.5–5.0)
Anion gap: 12 (ref 5–15)
BUN: 38 mg/dL — ABNORMAL HIGH (ref 8–23)
CO2: 30 mmol/L (ref 22–32)
Calcium: 9.1 mg/dL (ref 8.9–10.3)
Chloride: 99 mmol/L (ref 98–111)
Creatinine, Ser: 1.67 mg/dL — ABNORMAL HIGH (ref 0.44–1.00)
GFR, Estimated: 29 mL/min — ABNORMAL LOW (ref >60)
Glucose, Bld: 96 mg/dL (ref 70–99)
Phosphorus: 3 mg/dL (ref 2.5–4.6)
Potassium: 3.7 mmol/L (ref 3.5–5.1)
Sodium: 141 mmol/L (ref 135–145)

## 2024-03-30 LAB — GLUCOSE, CAPILLARY
Glucose-Capillary: 103 mg/dL — ABNORMAL HIGH (ref 70–99)
Glucose-Capillary: 107 mg/dL — ABNORMAL HIGH (ref 70–99)
Glucose-Capillary: 129 mg/dL — ABNORMAL HIGH (ref 70–99)
Glucose-Capillary: 166 mg/dL — ABNORMAL HIGH (ref 70–99)
Glucose-Capillary: 91 mg/dL (ref 70–99)
Glucose-Capillary: 97 mg/dL (ref 70–99)

## 2024-03-30 LAB — CBC
HCT: 43.2 % (ref 36.0–46.0)
Hemoglobin: 13.5 g/dL (ref 12.0–15.0)
MCH: 30.5 pg (ref 26.0–34.0)
MCHC: 31.3 g/dL (ref 30.0–36.0)
MCV: 97.7 fL (ref 80.0–100.0)
Platelets: 206 K/uL (ref 150–400)
RBC: 4.42 MIL/uL (ref 3.87–5.11)
RDW: 14.5 % (ref 11.5–15.5)
WBC: 11.1 K/uL — ABNORMAL HIGH (ref 4.0–10.5)
nRBC: 0 % (ref 0.0–0.2)

## 2024-03-30 LAB — C-REACTIVE PROTEIN: CRP: 6.1 mg/dL — ABNORMAL HIGH (ref <1.0)

## 2024-03-30 LAB — URIC ACID: Uric Acid, Serum: 9.2 mg/dL — ABNORMAL HIGH (ref 2.5–7.1)

## 2024-03-30 LAB — SEDIMENTATION RATE: Sed Rate: 42 mm/h — ABNORMAL HIGH (ref 0–22)

## 2024-03-30 LAB — MAGNESIUM: Magnesium: 2.1 mg/dL (ref 1.7–2.4)

## 2024-03-30 MED ORDER — METOPROLOL SUCCINATE ER 50 MG PO TB24
25.0000 mg | ORAL_TABLET | Freq: Every day | ORAL | Status: DC
  Administered 2024-03-30 – 2024-03-31 (×2): 25 mg via ORAL
  Filled 2024-03-30 (×2): qty 1

## 2024-03-30 MED ORDER — APIXABAN 2.5 MG PO TABS
2.5000 mg | ORAL_TABLET | Freq: Two times a day (BID) | ORAL | Status: DC
  Administered 2024-03-30 – 2024-03-31 (×2): 2.5 mg via ORAL
  Filled 2024-03-30 (×2): qty 1

## 2024-03-30 NOTE — Progress Notes (Signed)
 [facility]                                                                              Pamela [name], is a 89 y.o. female, DOB - 06-02-1935, FMW:[id number] Admit date - 03/28/2024    Outpatient Primary MD for the patient is [name], MD  LOS - 2  days  Chief Complaint  Patient presents with   Fall   Impaired Mobility        Brief summary   Patient is 89 year old female with diabetes mellitus type 2, CKD stage IV, anemia of chronic disease, history of A-fib, PE off Eliquis , combined systolic and diastolic CHF presented to ED with worsening lower extremity swelling and inability to walk.  Patient reported that she had a fall which made it difficult for her to walk and she has been mainly sitting in the recliner with her legs down which made her edema worse.  When she tried to ambulate she became profoundly more weaker, has been needing help to go to the bathroom.  She also feels her abdomen is swollen.  Worsening shortness of breath, not on O2 at baseline however in the ED required up to 4 L O2 via [place]. She also had a fall 5 weeks ago and was noted to have leg swelling and redness with possible cellulitis and was placed on Keflex which she has completed the course. Usually on Lasix , was changed to torsemide and feels not working. In ED, felt somewhat improved after getting IV Lasix   Assessment & Plan    Acute on chronic combined systolic and diastolic CHF Acute tori failure with hypoxia - 2D echo in 12/2021 had shown EF of 45 to 50%, G2 DD, global hypokinesis.   - Presented with lower extremity edema,  hypoxia, essentially bedridden in the recliner for 5 weeks after a fall.  Although denies overt shortness of breath however was found to have hypoxia, placed on 4 L O2 via [place], elevated BNP and chest x-ray with volume overload - Continue IV Lasix  40 mg every 12 hours - negative balance of 2.7 L, weight down from 255.5 lbs on admission -> 250.8 today  - Cardiology  following every 12 hours, follow strict I's and O's and daily weights - VQ scan negative for PE - Lower extremity Dopplers were ordered to rule out DVT given her bedridden state for 5 weeks and edema however patient refused to have the imaging done (per RN, she stated that it is painful during imaging) - 2D echo showed EF of 60 to 65%, G1 DD   Acute respiratory failure with hypoxia -History of PE - Continue IV Lasix , wean O2 as tolerated - VQ scan negative for PE - prior history of PE and now has been bedridden for last 5 weeks, refuses venous Dopplers of the lower extremity - Continue eliquis  2.5 mg p.o. twice daily  Paroxysmal atrial fibrillation - Diagnosed in 2023,CHADS2VASC score is 7  -Has been on the Lopressor  in the past, but patient stopped it, currently NSR - Started on Eliquis  2.5 mg twice daily  Generalized debility, venous stasis dermatitis - Patient has completed course of  Keflex, appears to have venous stasis dermatitis, hence hold off antibiotics.  If worsening redness, fevers chills, leukocytosis then will consider antibiotics. - Lower extremity edema, redness improving   AKI on CKD stage IIIb - Presented with creatinine of 1.6, trended up to 1.7 today likely due to Lasix  diuresis - Creatinine improving    Elevated troponin, hyperlipidemia - Follow 2D echo, likely demand ischemia, no acute chest pain - Cardiology following.  PAD, coronary artery calcifications on chest CT - Follow lipid panel - Vascular surgery was consulted by cardiology, Dr. Gretta and felt chronic aortic atherosclerotic changes with no further imaging needed, and do not preclude anticoagulation.    Nutrition Problem: Inadequate oral intake Etiology: acute illness  Interventions: Ensure Enlive (each supplement provides 350kcal and 20 grams of protein), MVI, Refer to RD note for recommendations  Obesity class III Estimated body mass index is 45.89 kg/m as calculated from the following:    Height as of this encounter: 5' 2 (1.575 m).   Weight as of this encounter: 113.8 kg.  Code Status: DNR DVT Prophylaxis:  apixaban  (ELIQUIS ) tablet 2.5 mg Start: 03/30/24 2200 apixaban  (ELIQUIS ) tablet 2.5 mg   Level of Care: Level of care: Progressive Family Communication: Updated patient's husband at the bedside on 9/26 Disposition Plan:      Remains inpatient appropriate:      Procedures:  2D echo VQ scan  Consultants:   Cardiology  Antimicrobials:   Anti-infectives (From admission, onward)    None          Medications  (feeding supplement) PROSource Plus  30 mL Oral Daily   apixaban   2.5 mg Oral BID   feeding supplement  237 mL Oral BID BM   furosemide   40 mg Intravenous Q12H   insulin  aspart  0-9 Units Subcutaneous Q4H   metoprolol  succinate  25 mg Oral Daily   multivitamin with minerals  1 tablet Oral Daily   pravastatin   20 mg Oral q1800   sodium chloride  flush  3 mL Intravenous Q12H      Subjective:   Pamela [name] was seen and examined today.  Feeling better, lower extremity redness, edema improving.  No acute chest pain, acute shortness of breath fevers or chills.   Objective:   Vitals:   03/29/24 2159 03/30/24 0500 03/30/24 0619 03/30/24 0929  BP: (!) 158/86  (!) 154/77 (!) 156/68  Pulse: (!) 105  (!) 103 (!) 105  Resp: 18  18   Temp: 98.1 F (36.7 C)  97.7 F (36.5 C)   TempSrc: Oral  Oral   SpO2: 92%     Weight:  113.8 kg    Height:        Intake/Output Summary (Last 24 hours) at 03/30/2024 1315 Last data filed at 03/30/2024 1200 Gross per 24 hour  Intake 1060 ml  Output 2120 ml  Net -1060 ml     Wt Readings from Last 3 Encounters:  03/30/24 113.8 kg  02/23/23 117.8 kg  02/16/22 117.8 kg   Physical Exam General: Alert and oriented x 3, NAD Cardiovascular: S1 S2 clear, RRR.  Respiratory: CTAB, no wheezing Gastrointestinal: Soft, nontender, nondistended, NBS Ext: no pedal edema bilaterally Neuro: no new deficits Skin:  Venous stasis dermatitis Psych: Normal affect      Data Reviewed:  I have personally reviewed following labs    CBC Lab Results  Component Value Date   WBC 11.1 (H) 03/30/2024   RBC 4.42 03/30/2024   HGB 13.5 03/30/2024  HCT 43.2 03/30/2024   MCV 97.7 03/30/2024   MCH 30.5 03/30/2024   PLT 206 03/30/2024   MCHC 31.3 03/30/2024   RDW 14.5 03/30/2024   LYMPHSABS 1.1 03/28/2024   MONOABS 0.7 03/28/2024   EOSABS 0.1 03/28/2024   BASOSABS 0.1 03/28/2024     Last metabolic panel Lab Results  Component Value Date   NA 141 03/30/2024   K 3.7 03/30/2024   CL 99 03/30/2024   CO2 30 03/30/2024   BUN 38 (H) 03/30/2024   CREATININE 1.67 (H) 03/30/2024   GLUCOSE 96 03/30/2024   GFRNONAA 29 (L) 03/30/2024   GFRAA 23 (L) 10/11/2019   CALCIUM  9.1 03/30/2024   PHOS 3.0 03/30/2024   PROT 6.9 03/29/2024   ALBUMIN  3.4 (L) 03/30/2024   BILITOT 0.5 03/29/2024   ALKPHOS 79 03/29/2024   AST 19 03/29/2024   ALT 10 03/29/2024   ANIONGAP 12 03/30/2024    CBG (last 3)  Recent Labs    03/30/24 0419 03/30/24 0840 03/30/24 1136  GLUCAP 91 107* 166*      Coagulation Profile: No results for input(s): INR, PROTIME in the last 168 hours.   Radiology Studies: I have personally reviewed the imaging studies  ECHOCARDIOGRAM COMPLETE Result Date: 03/29/2024    ECHOCARDIOGRAM REPORT   Patient Name:   VERNELLA NIZNIK Date of Exam: 03/29/2024 Medical Rec #:  [id number]      Height:       62.0 in Accession #:    [id number]     Weight:       255.5 lb Date of Birth:  [birth year]       BSA:          2.121 m Patient Age:    89 years       BP:           150/77 mmHg Patient Gender: F              HR:           96 bpm. Exam Location:  Inpatient Procedure: 2D Echo, Cardiac Doppler, Color Doppler and Strain Analysis (Both            Spectral and Color Flow Doppler were utilized during procedure). Indications:    CHF I50.21  History:        Patient has prior history of Echocardiogram examinations, most                  recent 12/29/2021. CHF, Signs/Symptoms:Edema and Shortness of                 Breath; Risk Factors:Diabetes.  Sonographer:    BERNARDA ROCKS Referring Phys: [email or web address].Diones ANASTASSIA DOUTOVA IMPRESSIONS  1. Left ventricular ejection fraction, by estimation, is 60 to 65%. The left ventricle has normal function. The left ventricle has no regional wall motion abnormalities. There is mild left ventricular hypertrophy. Left ventricular diastolic parameters are consistent with Grade I diastolic dysfunction (impaired relaxation). The average left ventricular global longitudinal strain is -15.5 %. The global longitudinal strain is abnormal.  2. Right ventricular systolic function is normal. The right ventricular size is normal.  3. The mitral valve is normal in structure. No evidence of mitral valve regurgitation. No evidence of mitral stenosis.  4. The aortic valve is normal in structure. Aortic valve regurgitation is not visualized. No aortic stenosis is present.  5. The inferior vena cava is normal in size with greater than 50% respiratory variability, suggesting right atrial pressure of 3  mmHg. FINDINGS  Left Ventricle: Left ventricular ejection fraction, by estimation, is 60 to 65%. The left ventricle has normal function. The left ventricle has no regional wall motion abnormalities. The average left ventricular global longitudinal strain is -15.5 %. Strain was performed and the global longitudinal strain is abnormal. The left ventricular internal cavity size was normal in size. There is mild left ventricular hypertrophy. Left ventricular diastolic parameters are consistent with Grade I diastolic dysfunction (impaired relaxation). Right Ventricle: The right ventricular size is normal. No increase in right ventricular wall thickness. Right ventricular systolic function is normal. Left Atrium: Left atrial size was normal in size. Right Atrium: Right atrial size was normal in size. Pericardium: There is no evidence of  pericardial effusion. Mitral Valve: The mitral valve is normal in structure. Mild mitral annular calcification. No evidence of mitral valve regurgitation. No evidence of mitral valve stenosis. MV peak gradient, 8.3 mmHg. The mean mitral valve gradient is 4.0 mmHg. Tricuspid Valve: The tricuspid valve is normal in structure. Tricuspid valve regurgitation is not demonstrated. No evidence of tricuspid stenosis. Aortic Valve: The aortic valve is normal in structure. Aortic valve regurgitation is not visualized. No aortic stenosis is present. Aortic valve mean gradient measures 3.0 mmHg. Aortic valve peak gradient measures 6.7 mmHg. Aortic valve area, by VTI measures 2.61 cm. Pulmonic Valve: The pulmonic valve was normal in structure. Pulmonic valve regurgitation is trivial. No evidence of pulmonic stenosis. Aorta: The aortic root is normal in size and structure. Venous: The inferior vena cava is normal in size with greater than 50% respiratory variability, suggesting right atrial pressure of 3 mmHg. IAS/Shunts: No atrial level shunt detected by color flow Doppler.  LEFT VENTRICLE PLAX 2D LVIDd:         4.00 cm      Diastology LVIDs:         3.00 cm      LV e' medial:    5.97 cm/s LV PW:         1.20 cm      LV E/e' medial:  14.2 LV IVS:        1.30 cm      LV e' lateral:   8.39 cm/s LVOT diam:     1.90 cm      LV E/e' lateral: 10.1 LV SV:         62 LV SV Index:   29           2D Longitudinal Strain LVOT Area:     2.84 cm     2D Strain GLS Avg:     -15.5 %  LV Volumes (MOD) LV vol d, MOD A2C: 183.0 ml LV vol d, MOD A4C: 146.0 ml LV vol s, MOD A2C: 59.3 ml LV vol s, MOD A4C: 50.8 ml LV SV MOD A2C:     123.7 ml LV SV MOD A4C:     146.0 ml LV SV MOD BP:      107.6 ml RIGHT VENTRICLE             IVC RV Basal diam:  3.40 cm     IVC diam: 1.50 cm RV S prime:     24.40 cm/s TAPSE (M-mode): 2.8 cm LEFT ATRIUM             Index        RIGHT ATRIUM          Index LA diam:        3.90 cm 1.84 cm/m  RA Area:     9.95 cm LA Vol  (A2C):   44.3 ml 20.88 ml/m  RA Volume:   19.90 ml 9.38 ml/m LA Vol (A4C):   41.1 ml 19.37 ml/m LA Biplane Vol: 44.4 ml 20.93 ml/m  AORTIC VALVE                    PULMONIC VALVE AV Area (Vmax):    2.57 cm     PV Vmax:          1.33 m/s AV Area (Vmean):   2.27 cm     PV Peak grad:     7.1 mmHg AV Area (VTI):     2.61 cm     PR End Diast Vel: 2.81 msec AV Vmax:           129.00 cm/s AV Vmean:          76.800 cm/s AV VTI:            0.236 m AV Peak Grad:      6.7 mmHg AV Mean Grad:      3.0 mmHg LVOT Vmax:         117.00 cm/s LVOT Vmean:        61.600 cm/s LVOT VTI:          0.217 m LVOT/AV VTI ratio: 0.92  AORTA Ao Root diam: 2.60 cm Ao Asc diam:  3.40 cm MITRAL VALVE                TRICUSPID VALVE MV Area (PHT): 4.99 cm     TR Peak grad:   32.7 mmHg MV Area VTI:   2.04 cm     TR Vmax:        286.00 cm/s MV Peak grad:  8.3 mmHg MV Mean grad:  4.0 mmHg     SHUNTS MV Vmax:       1.44 m/s     Systemic VTI:  0.22 m MV Vmean:      89.4 cm/s    Systemic Diam: 1.90 cm MV Decel Time: 152 msec MV E velocity: 85.00 cm/s MV A velocity: 149.00 cm/s MV E/A ratio:  0.57 Oneil Parchment MD Electronically signed by Oneil Parchment MD Signature Date/Time: 03/29/2024/3:50:25 PM    Final    NM Pulmonary Perfusion Result Date: 03/29/2024 CLINICAL DATA:  Pulmonary embolism (PE) suspected, low to intermediate prob, positive D-dimer History of pulmonary embolism. EXAM: NUCLEAR MEDICINE PERFUSION LUNG SCAN TECHNIQUE: Perfusion images were obtained in multiple projections after intravenous injection of radiopharmaceutical. No ventilation imaging performed. RADIOPHARMACEUTICALS:  3.2 mCi Tc-73m MAA IV COMPARISON:  Chest radiographs 03/28/2024.  Chest CTA 04/30/2021. FINDINGS: Low lung volumes with elevation of the right hemidiaphragm. Lateral and oblique views are limited by body habitus. There are no wedge-shaped perfusion defects to suggest acute pulmonary embolism. IMPRESSION: No evidence of acute pulmonary embolism on perfusion  scintigraphy by PISAPED criteria. Electronically Signed   By: Elsie Perone M.D.   On: 03/29/2024 14:27   DG Chest Port 1 View Result Date: 03/28/2024 CLINICAL DATA:  Lower extremity swelling.  Fell 5 weeks ago. EXAM: PORTABLE CHEST 1 VIEW COMPARISON:  12/28/2021 FINDINGS: Shallow inspiration with atelectasis in the lung bases. Cardiac enlargement with mild vascular congestion. No obvious edema or consolidation. No pleural effusion or pneumothorax. Mediastinal contours appear intact. Calcified and tortuous aorta. Degenerative changes in the spine and shoulders. IMPRESSION: Shallow inspiration with atelectasis in the lung bases. Cardiac enlargement with pulmonary vascular congestion. No  edema. Electronically Signed   By: Elsie Gravely M.D.   On: 03/28/2024 17:17       Setareh Rom M.D. [facility] 03/30/2024, 1:15 PM  Available via Epic secure chat 7am-7pm After 7 pm, please refer to night coverage provider listed on amion.

## 2024-03-30 NOTE — Discharge Instructions (Signed)

## 2024-03-30 NOTE — Progress Notes (Signed)
 Progress Note  Patient Name: Pamela [name] Date of Encounter: 03/30/2024  [facility] Cardiologist: Dorn Lesches, MD   Subjective   Feeling better.  SOB and LE edema improved. Good UOP with 2.7L out overnight and net - 2.6L since admit  Inpatient Medications    Scheduled Meds:  (feeding supplement) PROSource Plus  30 mL Oral Daily   apixaban   5 mg Oral BID   feeding supplement  237 mL Oral BID BM   furosemide   40 mg Intravenous Q12H   insulin  aspart  0-9 Units Subcutaneous Q4H   multivitamin with minerals  1 tablet Oral Daily   pravastatin   20 mg Oral q1800   sodium chloride  flush  3 mL Intravenous Q12H   Continuous Infusions:  PRN Meds: acetaminophen  **OR** acetaminophen , ALPRAZolam , fentaNYL  (SUBLIMAZE ) injection, HYDROcodone -acetaminophen , loperamide , mouth rinse, sodium chloride  flush, traZODone    Vital Signs    Vitals:   03/29/24 1400 03/29/24 2159 03/30/24 0500 03/30/24 0619  BP:  (!) 158/86  (!) 154/77  Pulse:  (!) 105  (!) 103  Resp: 20 18  18   Temp:  98.1 F (36.7 C)  97.7 F (36.5 C)  TempSrc:  Oral  Oral  SpO2:  92%    Weight:   113.8 kg   Height:        Intake/Output Summary (Last 24 hours) at 03/30/2024 0857 Last data filed at 03/30/2024 0522 Gross per 24 hour  Intake 1050 ml  Output 2720 ml  Net -1670 ml      03/30/2024    5:00 AM 03/29/2024    1:18 AM 02/23/2023    9:22 AM  Last 3 Weights  Weight (lbs) 250 lb 14.1 oz 255 lb 8.2 oz 259 lb 11.2 oz  Weight (kg) 113.8 kg 115.9 kg 117.8 kg      Telemetry    Appears to be sinus tach with frequent PACs- Personally Reviewed  ECG    No new EKG to review - Personally Reviewed  Physical Exam   GEN: No acute distress.   Neck: No JVD Cardiac: RRR with frequent ectopy, no murmurs, rubs, or gallops.  Respiratory: Clear to auscultation bilaterally. GI: Soft, nontender, non-distended  MS: No edema; No deformity. Neuro:  Nonfocal  Psych: Normal affect   Labs    High Sensitivity  Troponin:  No results for input(s): TROPONINIHS in the last 720 hours.    Chemistry Recent Labs  Lab 03/28/24 2140 03/29/24 0111 03/30/24 0529  NA 141 141 141  K 4.2 4.1 3.7  CL 103 101 99  CO2 25 26 30   GLUCOSE 107* 104* 96  BUN 43* 44* 38*  CREATININE 1.62* 1.73* 1.67*  CALCIUM  9.1 9.3 9.1  PROT  --  6.9  --   ALBUMIN   --  3.6 3.4*  AST  --  19  --   ALT  --  10  --   ALKPHOS  --  79  --   BILITOT  --  0.5  --   GFRNONAA 30* 28* 29*  ANIONGAP 13 14 12      Hematology Recent Labs  Lab 03/28/24 1602 03/29/24 0111 03/30/24 0529  WBC 9.7 9.6 11.1*  RBC 4.59 4.34 4.42  HGB 13.6 13.4 13.5  HCT 43.9 41.3 43.2  MCV 95.6 95.2 97.7  MCH 29.6 30.9 30.5  MCHC 31.0 32.4 31.3  RDW 14.8 14.8 14.5  PLT 233 224 206    BNP Recent Labs  Lab 03/28/24 1753  PROBNP 698.0*  DDimer  Recent Labs  Lab 03/29/24 0111  DDIMER 1.68*     CHA2DS2-VASc Score = 7   This indicates a 11.2% annual risk of stroke. The patient's score is based upon: CHF History: 1 HTN History: 1 Diabetes History: 1 Stroke History: 0 Vascular Disease History: 1 (aortic/coronary atherosclerosis) Age Score: 2 Gender Score: 1      Radiology    ECHOCARDIOGRAM COMPLETE Result Date: 03/29/2024    ECHOCARDIOGRAM REPORT   Patient Name:   Pamela [name] Date of Exam: 03/29/2024 Medical Rec #:  [id number]      Height:       62.0 in Accession #:    [id number]     Weight:       255.5 lb Date of Birth:  05/27/1935       BSA:          2.121 m Patient Age:    89 years       BP:           150/77 mmHg Patient Gender: F              HR:           96 bpm. Exam Location:  Inpatient Procedure: 2D Echo, Cardiac Doppler, Color Doppler and Strain Analysis (Both            Spectral and Color Flow Doppler were utilized during procedure). Indications:    CHF I50.21  History:        Patient has prior history of Echocardiogram examinations, most                 recent 12/29/2021. CHF, Signs/Symptoms:Edema and Shortness of                  Breath; Risk Factors:Diabetes.  Sonographer:    BERNARDA ROCKS Referring Phys: [email or web address] ANASTASSIA DOUTOVA IMPRESSIONS  1. Left ventricular ejection fraction, by estimation, is 60 to 65%. The left ventricle has normal function. The left ventricle has no regional wall motion abnormalities. There is mild left ventricular hypertrophy. Left ventricular diastolic parameters are consistent with Grade I diastolic dysfunction (impaired relaxation). The average left ventricular global longitudinal strain is -15.5 %. The global longitudinal strain is abnormal.  2. Right ventricular systolic function is normal. The right ventricular size is normal.  3. The mitral valve is normal in structure. No evidence of mitral valve regurgitation. No evidence of mitral stenosis.  4. The aortic valve is normal in structure. Aortic valve regurgitation is not visualized. No aortic stenosis is present.  5. The inferior vena cava is normal in size with greater than 50% respiratory variability, suggesting right atrial pressure of 3 mmHg. FINDINGS  Left Ventricle: Left ventricular ejection fraction, by estimation, is 60 to 65%. The left ventricle has normal function. The left ventricle has no regional wall motion abnormalities. The average left ventricular global longitudinal strain is -15.5 %. Strain was performed and the global longitudinal strain is abnormal. The left ventricular internal cavity size was normal in size. There is mild left ventricular hypertrophy. Left ventricular diastolic parameters are consistent with Grade I diastolic dysfunction (impaired relaxation). Right Ventricle: The right ventricular size is normal. No increase in right ventricular wall thickness. Right ventricular systolic function is normal. Left Atrium: Left atrial size was normal in size. Right Atrium: Right atrial size was normal in size. Pericardium: There is no evidence of pericardial effusion. Mitral Valve: The mitral valve is normal in structure.  Mild mitral annular calcification. No  evidence of mitral valve regurgitation. No evidence of mitral valve stenosis. MV peak gradient, 8.3 mmHg. The mean mitral valve gradient is 4.0 mmHg. Tricuspid Valve: The tricuspid valve is normal in structure. Tricuspid valve regurgitation is not demonstrated. No evidence of tricuspid stenosis. Aortic Valve: The aortic valve is normal in structure. Aortic valve regurgitation is not visualized. No aortic stenosis is present. Aortic valve mean gradient measures 3.0 mmHg. Aortic valve peak gradient measures 6.7 mmHg. Aortic valve area, by VTI measures 2.61 cm. Pulmonic Valve: The pulmonic valve was normal in structure. Pulmonic valve regurgitation is trivial. No evidence of pulmonic stenosis. Aorta: The aortic root is normal in size and structure. Venous: The inferior vena cava is normal in size with greater than 50% respiratory variability, suggesting right atrial pressure of 3 mmHg. IAS/Shunts: No atrial level shunt detected by color flow Doppler.  LEFT VENTRICLE PLAX 2D LVIDd:         4.00 cm      Diastology LVIDs:         3.00 cm      LV e' medial:    5.97 cm/s LV PW:         1.20 cm      LV E/e' medial:  14.2 LV IVS:        1.30 cm      LV e' lateral:   8.39 cm/s LVOT diam:     1.90 cm      LV E/e' lateral: 10.1 LV SV:         62 LV SV Index:   29           2D Longitudinal Strain LVOT Area:     2.84 cm     2D Strain GLS Avg:     -15.5 %  LV Volumes (MOD) LV vol d, MOD A2C: 183.0 ml LV vol d, MOD A4C: 146.0 ml LV vol s, MOD A2C: 59.3 ml LV vol s, MOD A4C: 50.8 ml LV SV MOD A2C:     123.7 ml LV SV MOD A4C:     146.0 ml LV SV MOD BP:      107.6 ml RIGHT VENTRICLE             IVC RV Basal diam:  3.40 cm     IVC diam: 1.50 cm RV S prime:     24.40 cm/s TAPSE (M-mode): 2.8 cm LEFT ATRIUM             Index        RIGHT ATRIUM          Index LA diam:        3.90 cm 1.84 cm/m   RA Area:     9.95 cm LA Vol (A2C):   44.3 ml 20.88 ml/m  RA Volume:   19.90 ml 9.38 ml/m LA Vol (A4C):    41.1 ml 19.37 ml/m LA Biplane Vol: 44.4 ml 20.93 ml/m  AORTIC VALVE                    PULMONIC VALVE AV Area (Vmax):    2.57 cm     PV Vmax:          1.33 m/s AV Area (Vmean):   2.27 cm     PV Peak grad:     7.1 mmHg AV Area (VTI):     2.61 cm     PR End Diast Vel: 2.81 msec AV Vmax:  129.00 cm/s AV Vmean:          76.800 cm/s AV VTI:            0.236 m AV Peak Grad:      6.7 mmHg AV Mean Grad:      3.0 mmHg LVOT Vmax:         117.00 cm/s LVOT Vmean:        61.600 cm/s LVOT VTI:          0.217 m LVOT/AV VTI ratio: 0.92  AORTA Ao Root diam: 2.60 cm Ao Asc diam:  3.40 cm MITRAL VALVE                TRICUSPID VALVE MV Area (PHT): 4.99 cm     TR Peak grad:   32.7 mmHg MV Area VTI:   2.04 cm     TR Vmax:        286.00 cm/s MV Peak grad:  8.3 mmHg MV Mean grad:  4.0 mmHg     SHUNTS MV Vmax:       1.44 m/s     Systemic VTI:  0.22 m MV Vmean:      89.4 cm/s    Systemic Diam: 1.90 cm MV Decel Time: 152 msec MV E velocity: 85.00 cm/s MV A velocity: 149.00 cm/s MV E/A ratio:  0.57 Oneil Parchment MD Electronically signed by Oneil Parchment MD Signature Date/Time: 03/29/2024/3:50:25 PM    Final    NM Pulmonary Perfusion Result Date: 03/29/2024 CLINICAL DATA:  Pulmonary embolism (PE) suspected, low to intermediate prob, positive D-dimer History of pulmonary embolism. EXAM: NUCLEAR MEDICINE PERFUSION LUNG SCAN TECHNIQUE: Perfusion images were obtained in multiple projections after intravenous injection of radiopharmaceutical. No ventilation imaging performed. RADIOPHARMACEUTICALS:  3.2 mCi Tc-59m MAA IV COMPARISON:  Chest radiographs 03/28/2024.  Chest CTA 04/30/2021. FINDINGS: Low lung volumes with elevation of the right hemidiaphragm. Lateral and oblique views are limited by body habitus. There are no wedge-shaped perfusion defects to suggest acute pulmonary embolism. IMPRESSION: No evidence of acute pulmonary embolism on perfusion scintigraphy by PISAPED criteria. Electronically Signed   By: Elsie Perone M.D.    On: 03/29/2024 14:27   DG Chest Port 1 View Result Date: 03/28/2024 CLINICAL DATA:  Lower extremity swelling.  Fell 5 weeks ago. EXAM: PORTABLE CHEST 1 VIEW COMPARISON:  12/28/2021 FINDINGS: Shallow inspiration with atelectasis in the lung bases. Cardiac enlargement with mild vascular congestion. No obvious edema or consolidation. No pleural effusion or pneumothorax. Mediastinal contours appear intact. Calcified and tortuous aorta. Degenerative changes in the spine and shoulders. IMPRESSION: Shallow inspiration with atelectasis in the lung bases. Cardiac enlargement with pulmonary vascular congestion. No edema. Electronically Signed   By: Elsie Gravely M.D.   On: 03/28/2024 17:17    Patient Profile     89 y.o. female with a hx of PAF, HTN, DM, former tobacco use, HLD, chronic HFmrEF, bilateral PE in 2022 with RV strain, CKD stage 3b, LBBB, diverticulosis, pulmonary nodule, ectatic infrarenal aorta (2.7cm in 02/2023), aortic atherosclerosis, coronary calcification on CT who is being seen 03/29/2024 for the evaluation of PAF at the request of Dr. Kelley.   Assessment & Plan    Acute on chronic hypoxemic Respiratory Failure Acute on Chronic HFmrEF Elevated DDIMER -has had CHF exacerbations in the past with known mild LV dysfunction: EF 45-60% by echo 2023 -developed progressive LE edema while essentially bedridden in a recliner for 5 weeks after a fall -admitted with acute hypoxemia although patient denied any  SOB -ddimer elevated >>VQ scan with no PE -elevated BNP 698 and Cxray c/w volume overload -Echo 03/29/24: EF 60-65%, G1DD, mild LVH, normal RV, RAP . -currently on Lasix  40mg  IV BID -UOP 2.7L yesterday and net -2.6L since admit; SCr 1.73>1.67; K+ 3.7; Mag 2.1; TSH 3.1 -remains volume overloaded -continue lasix  40mg  IV BID -follow strict I&O's; daily weights and renal function -check LE venous dopplers to rule out DVT given her bedridden state for 5 weeks    Paroxysmal Atrial  Fibrillation -dx in 2023 -Eliquis  has been recommended on multiple occasions but patient has not followed through -CHADS2VASC score is 7 -appears to have ST with frequent PACs on tele but will check EKG -restarted Eliquis  2.5mg  BID (dosed for age>80 and SCr<1.5)>>patient had stopped taking despite being recommended -start Toprol  Xl 25mg  daily for HR control and suppression of PACs  HTN -BP elevated on exam today -was supposed to be on BB at home but was not taking -start Toprol  Xl 25mg  daily   AKI on CKD 3b -baseline 1.2>>2.2>>follows with nephrology -SCr today 1.67 -follow closely with diuresis   Elevated Troponin Coronary artery Calcifications on Chest CT HLD -minimal increase in hsTrop  39>>37 not c/w ACS -likely demand ischemia in setting of acute CHF -she does have coronary Cal on Chest CT and reduced EF but denies any chest pain -given her comorbidities including essentially bedbound, CKD 3b and lack of anginal sx, recommend medical management -2D echo with normal LVF -no ASA due to DOAC -continue Pravastatin  20mg  daily   PAD -prior CT imaging has shown atherosclerotic calcifications and infrarenal aortic ectasia as well as patchy areas of peripheral intraluminal thrombus/hematomas in the abdominal aorta  -vascular surgery reviewed CT images and felt chronic aortic atherosclerotic changes with no further imaging needed -check FLP in am  I spent 35 minutes caring for this patient today face to face, ordering and reviewing labs, reviewing records from 2D echo, seeing the patient, documenting in the record,      For questions or updates, please contact [facility] Please consult [email or web address] for contact info under        Signed, Wilbert Bihari, MD  03/30/2024, 8:57 AM

## 2024-03-31 ENCOUNTER — Inpatient Hospital Stay (HOSPITAL_COMMUNITY)

## 2024-03-31 DIAGNOSIS — J9621 Acute and chronic respiratory failure with hypoxia: Secondary | ICD-10-CM

## 2024-03-31 DIAGNOSIS — K921 Melena: Secondary | ICD-10-CM

## 2024-03-31 DIAGNOSIS — R0902 Hypoxemia: Secondary | ICD-10-CM | POA: Diagnosis not present

## 2024-03-31 DIAGNOSIS — Z7901 Long term (current) use of anticoagulants: Secondary | ICD-10-CM | POA: Diagnosis not present

## 2024-03-31 DIAGNOSIS — R600 Localized edema: Secondary | ICD-10-CM | POA: Diagnosis not present

## 2024-03-31 DIAGNOSIS — R531 Weakness: Secondary | ICD-10-CM | POA: Diagnosis not present

## 2024-03-31 DIAGNOSIS — R7989 Other specified abnormal findings of blood chemistry: Secondary | ICD-10-CM | POA: Diagnosis not present

## 2024-03-31 DIAGNOSIS — Z86711 Personal history of pulmonary embolism: Secondary | ICD-10-CM

## 2024-03-31 DIAGNOSIS — I480 Paroxysmal atrial fibrillation: Secondary | ICD-10-CM | POA: Diagnosis not present

## 2024-03-31 DIAGNOSIS — R262 Difficulty in walking, not elsewhere classified: Secondary | ICD-10-CM | POA: Diagnosis not present

## 2024-03-31 DIAGNOSIS — I5023 Acute on chronic systolic (congestive) heart failure: Secondary | ICD-10-CM | POA: Diagnosis not present

## 2024-03-31 LAB — GLUCOSE, CAPILLARY
Glucose-Capillary: 115 mg/dL — ABNORMAL HIGH (ref 70–99)
Glucose-Capillary: 116 mg/dL — ABNORMAL HIGH (ref 70–99)
Glucose-Capillary: 121 mg/dL — ABNORMAL HIGH (ref 70–99)
Glucose-Capillary: 130 mg/dL — ABNORMAL HIGH (ref 70–99)
Glucose-Capillary: 152 mg/dL — ABNORMAL HIGH (ref 70–99)
Glucose-Capillary: 170 mg/dL — ABNORMAL HIGH (ref 70–99)
Glucose-Capillary: 217 mg/dL — ABNORMAL HIGH (ref 70–99)

## 2024-03-31 LAB — CBC
HCT: 42.8 % (ref 36.0–46.0)
Hemoglobin: 13.1 g/dL (ref 12.0–15.0)
MCH: 29.9 pg (ref 26.0–34.0)
MCHC: 30.6 g/dL (ref 30.0–36.0)
MCV: 97.7 fL (ref 80.0–100.0)
Platelets: 207 K/uL (ref 150–400)
RBC: 4.38 MIL/uL (ref 3.87–5.11)
RDW: 14.2 % (ref 11.5–15.5)
WBC: 11.9 K/uL — ABNORMAL HIGH (ref 4.0–10.5)
nRBC: 0 % (ref 0.0–0.2)

## 2024-03-31 LAB — HEMOGLOBIN AND HEMATOCRIT, BLOOD
HCT: 35.7 % — ABNORMAL LOW (ref 36.0–46.0)
HCT: 34.2 % — ABNORMAL LOW (ref 36.0–46.0)
Hemoglobin: 11.4 g/dL — ABNORMAL LOW (ref 12.0–15.0)
Hemoglobin: 11 g/dL — ABNORMAL LOW (ref 12.0–15.0)

## 2024-03-31 LAB — RENAL FUNCTION PANEL
Albumin: 3.2 g/dL — ABNORMAL LOW (ref 3.5–5.0)
Anion gap: 11 (ref 5–15)
BUN: 43 mg/dL — ABNORMAL HIGH (ref 8–23)
CO2: 31 mmol/L (ref 22–32)
Calcium: 9 mg/dL (ref 8.9–10.3)
Chloride: 95 mmol/L — ABNORMAL LOW (ref 98–111)
Creatinine, Ser: 1.68 mg/dL — ABNORMAL HIGH (ref 0.44–1.00)
GFR, Estimated: 29 mL/min — ABNORMAL LOW (ref >60)
Glucose, Bld: 102 mg/dL — ABNORMAL HIGH (ref 70–99)
Phosphorus: 3.1 mg/dL (ref 2.5–4.6)
Potassium: 3.9 mmol/L (ref 3.5–5.1)
Sodium: 137 mmol/L (ref 135–145)

## 2024-03-31 LAB — LIPID PANEL
Cholesterol: 106 mg/dL (ref 0–200)
HDL: 45 mg/dL (ref >40)
LDL Cholesterol: 44 mg/dL (ref 0–99)
Total CHOL/HDL Ratio: 2.4 ratio
Triglycerides: 83 mg/dL (ref <150)
VLDL: 17 mg/dL (ref 0–40)

## 2024-03-31 LAB — MAGNESIUM: Magnesium: 2.2 mg/dL (ref 1.7–2.4)

## 2024-03-31 MED ORDER — PROCHLORPERAZINE EDISYLATE 10 MG/2ML IJ SOLN: 10.0000 mg | Freq: Four times a day (QID) | INTRAMUSCULAR | Status: DC | PRN

## 2024-03-31 MED ORDER — PREDNISONE 20 MG PO TABS
40.0000 mg | ORAL_TABLET | Freq: Every day | ORAL | Status: DC
  Administered 2024-04-01 – 2024-04-04 (×4): 40 mg via ORAL
  Filled 2024-03-31 (×4): qty 2

## 2024-03-31 MED ORDER — PANTOPRAZOLE SODIUM 40 MG PO TBEC
40.0000 mg | DELAYED_RELEASE_TABLET | Freq: Every day | ORAL | Status: DC
  Administered 2024-03-31: 40 mg via ORAL
  Filled 2024-03-31: qty 1

## 2024-03-31 MED ORDER — PANTOPRAZOLE SODIUM 40 MG PO TBEC
40.0000 mg | DELAYED_RELEASE_TABLET | Freq: Two times a day (BID) | ORAL | Status: DC
Start: 2024-03-31 — End: 2024-04-01
  Administered 2024-03-31 – 2024-04-01 (×2): 40 mg via ORAL
  Filled 2024-03-31 (×2): qty 1

## 2024-03-31 MED ORDER — COLCHICINE 0.6 MG PO TABS
1.2000 mg | ORAL_TABLET | Freq: Once | ORAL | Status: AC
  Administered 2024-03-31: 1.2 mg via ORAL
  Filled 2024-03-31: qty 2

## 2024-03-31 MED ORDER — METOPROLOL SUCCINATE ER 50 MG PO TB24
25.0000 mg | ORAL_TABLET | Freq: Every day | ORAL | Status: DC
  Administered 2024-04-01 – 2024-04-04 (×4): 25 mg via ORAL
  Filled 2024-03-31 (×4): qty 1

## 2024-03-31 MED ORDER — METOPROLOL SUCCINATE ER 50 MG PO TB24
25.0000 mg | ORAL_TABLET | Freq: Once | ORAL | Status: AC
Start: 2024-03-31 — End: 2024-03-31
  Administered 2024-03-31: 25 mg via ORAL
  Filled 2024-03-31: qty 1

## 2024-03-31 MED ORDER — COLCHICINE 0.6 MG PO TABS
0.6000 mg | ORAL_TABLET | Freq: Every day | ORAL | Status: DC
  Administered 2024-04-01 – 2024-04-04 (×4): 0.6 mg via ORAL
  Filled 2024-03-31 (×4): qty 1

## 2024-03-31 MED ORDER — METHYLPREDNISOLONE SODIUM SUCC 125 MG IJ SOLR
60.0000 mg | Freq: Once | INTRAMUSCULAR | Status: AC
  Administered 2024-03-31: 60 mg via INTRAVENOUS
  Filled 2024-03-31: qty 2

## 2024-03-31 MED ORDER — PREDNISONE 20 MG PO TABS: 40.0000 mg | ORAL_TABLET | Freq: Every day | ORAL | Status: DC

## 2024-03-31 MED ORDER — METOPROLOL SUCCINATE ER 50 MG PO TB24: 50.0000 mg | ORAL_TABLET | Freq: Every day | ORAL | Status: DC

## 2024-03-31 NOTE — Progress Notes (Signed)
 Venous duplex lower ext  has been completed. Refer to [facility] under chart review to view preliminary results.   03/31/2024  12:13 PM Carey Johndrow, Ricka BIRCH

## 2024-03-31 NOTE — Progress Notes (Addendum)
 Progress Note  Patient Name: Pamela [name] Date of Encounter: 03/31/2024  [facility] Cardiologist: Dorn Lesches, MD   Subjective   Good UOP with 1.9 L out  and net -2.8 L since admit  Had BRBPR this morning.  Denies any shortness of breath.  Had some epigastric discomfort this morning in the setting of her GI bleed that has resolved.  Inpatient Medications    Scheduled Meds:  (feeding supplement) PROSource Plus  30 mL Oral Daily   [START ON 04/01/2024] colchicine  0.6 mg Oral Daily   feeding supplement  237 mL Oral BID BM   furosemide   40 mg Intravenous Q12H   insulin  aspart  0-9 Units Subcutaneous Q4H   metoprolol  succinate  25 mg Oral Daily   multivitamin with minerals  1 tablet Oral Daily   pantoprazole   40 mg Oral Daily   pravastatin   20 mg Oral q1800   [START ON 04/01/2024] predniSONE   40 mg Oral QAC breakfast   sodium chloride  flush  3 mL Intravenous Q12H   Continuous Infusions:  PRN Meds: acetaminophen  **OR** acetaminophen , ALPRAZolam , fentaNYL  (SUBLIMAZE ) injection, HYDROcodone -acetaminophen , loperamide , mouth rinse, prochlorperazine , sodium chloride  flush, traZODone    Vital Signs    Vitals:   03/30/24 1340 03/30/24 2010 03/31/24 0424 03/31/24 0845  BP: (!) 145/84 (!) 149/79 (!) 164/85 (!) 164/85  Pulse:  74 95 95  Resp: 20 16 18    Temp: 98.3 F (36.8 C) 97.9 F (36.6 C) (!) 97.5 F (36.4 C)   TempSrc: Oral Oral    SpO2: 96% 98% 98%   Weight:      Height:        Intake/Output Summary (Last 24 hours) at 03/31/2024 1143 Last data filed at 03/31/2024 9087 Gross per 24 hour  Intake 1200 ml  Output 1200 ml  Net 0 ml      03/30/2024    5:00 AM 03/29/2024    1:18 AM 02/23/2023    9:22 AM  Last 3 Weights  Weight (lbs) 250 lb 14.1 oz 255 lb 8.2 oz 259 lb 11.2 oz  Weight (kg) 113.8 kg 115.9 kg 117.8 kg      Telemetry    Normal sinus rhythm to sinus tachycardia with frequent PACs- Personally Reviewed  ECG    No new EKG to review - Personally  Reviewed  Physical Exam   GEN: Well nourished, well developed in no acute distress HEENT: Normal NECK: No JVD; No carotid bruits LYMPHATICS: No lymphadenopathy CARDIAC:RRR, no murmurs, rubs, gallops RESPIRATORY:  Clear to auscultation without rales, wheezing or rhonchi  ABDOMEN: Soft, non-tender, non-distended MUSCULOSKELETAL: 1+ bilateral lower extremity edema; No deformity  SKIN: Warm and dry NEUROLOGIC:  Alert and oriented x 3 PSYCHIATRIC:  Normal affect  Labs    High Sensitivity Troponin:  No results for input(s): TROPONINIHS in the last 720 hours.    Chemistry Recent Labs  Lab 03/29/24 0111 03/30/24 0529 03/31/24 0500  NA 141 141 137  K 4.1 3.7 3.9  CL 101 99 95*  CO2 26 30 31   GLUCOSE 104* 96 102*  BUN 44* 38* 43*  CREATININE 1.73* 1.67* 1.68*  CALCIUM  9.3 9.1 9.0  PROT 6.9  --   --   ALBUMIN  3.6 3.4* 3.2*  AST 19  --   --   ALT 10  --   --   ALKPHOS 79  --   --   BILITOT 0.5  --   --   GFRNONAA 28* 29* 29*  ANIONGAP  14 12 11      Hematology Recent Labs  Lab 03/29/24 0111 03/30/24 0529 03/31/24 0500  WBC 9.6 11.1* 11.9*  RBC 4.34 4.42 4.38  HGB 13.4 13.5 13.1  HCT 41.3 43.2 42.8  MCV 95.2 97.7 97.7  MCH 30.9 30.5 29.9  MCHC 32.4 31.3 30.6  RDW 14.8 14.5 14.2  PLT 224 206 207    BNP Recent Labs  Lab 03/28/24 1753  PROBNP 698.0*     DDimer  Recent Labs  Lab 03/29/24 0111  DDIMER 1.68*     CHA2DS2-VASc Score = 7   This indicates a 11.2% annual risk of stroke. The patient's score is based upon: CHF History: 1 HTN History: 1 Diabetes History: 1 Stroke History: 0 Vascular Disease History: 1 (aortic/coronary atherosclerosis) Age Score: 2 Gender Score: 1      Radiology    DG Knee Right Port Result Date: 03/30/2024 EXAM: 1 or 2 VIEW(S) XRAY OF THE KNEE 03/30/2024 05:56:00 PM COMPARISON: None available. CLINICAL HISTORY: Pain 144615. Fall and pain. FINDINGS: BONES AND JOINTS: No acute fracture. No focal osseous lesion. No joint  dislocation. Small joint effusion. Moderate to severe tricompartmental degenerative changes with joint space narrowing and osteophyte formation. SOFT TISSUES: Vascular calcifications. IMPRESSION: 1. Moderate to severe tricompartmental osteoarthritis with joint space narrowing and osteophyte formation. 2. Small knee joint effusion. Electronically signed by: Norman Gatlin MD 03/30/2024 07:23 PM EDT RP Workstation: [id number]   DG Ankle Right Port Result Date: 03/30/2024 EXAM: 1 or 2 VIEW(S) XRAY OF THE ANKLE 03/30/2024 05:56:00 PM CLINICAL HISTORY: Pain 855384. Fall and pain. COMPARISON: None available. FINDINGS: BONES AND JOINTS: No acute fracture. No focal osseous lesion. No joint dislocation. Plantar calcaneal spur. Tibiotalar joint space narrowing with marginal osteophytes. SOFT TISSUES: Subcutaneous soft tissue edema. IMPRESSION: 1. No acute osseous abnormality. Electronically signed by: Norman Gatlin MD 03/30/2024 07:22 PM EDT RP Workstation: [id number]   ECHOCARDIOGRAM COMPLETE Result Date: 03/29/2024    ECHOCARDIOGRAM REPORT   Patient Name:   Pamela [name] Date of Exam: 03/29/2024 Medical Rec #:  [id number]      Height:       62.0 in Accession #:    [id number]     Weight:       255.5 lb Date of Birth:  05/06/1935       BSA:          2.121 m Patient Age:    89 years       BP:           150/77 mmHg Patient Gender: F              HR:           96 bpm. Exam Location:  Inpatient Procedure: 2D Echo, Cardiac Doppler, Color Doppler and Strain Analysis (Both            Spectral and Color Flow Doppler were utilized during procedure). Indications:    CHF I50.21  History:        Patient has prior history of Echocardiogram examinations, most                 recent 12/29/2021. CHF, Signs/Symptoms:Edema and Shortness of                 Breath; Risk Factors:Diabetes.  Sonographer:    BERNARDA ROCKS Referring Phys: [email or web address] ANASTASSIA DOUTOVA IMPRESSIONS  1. Left ventricular ejection fraction, by estimation, is 60 to 65%. The  left ventricle has normal function. The left ventricle  has no regional wall motion abnormalities. There is mild left ventricular hypertrophy. Left ventricular diastolic parameters are consistent with Grade I diastolic dysfunction (impaired relaxation). The average left ventricular global longitudinal strain is -15.5 %. The global longitudinal strain is abnormal.  2. Right ventricular systolic function is normal. The right ventricular size is normal.  3. The mitral valve is normal in structure. No evidence of mitral valve regurgitation. No evidence of mitral stenosis.  4. The aortic valve is normal in structure. Aortic valve regurgitation is not visualized. No aortic stenosis is present.  5. The inferior vena cava is normal in size with greater than 50% respiratory variability, suggesting right atrial pressure of 3 mmHg. FINDINGS  Left Ventricle: Left ventricular ejection fraction, by estimation, is 60 to 65%. The left ventricle has normal function. The left ventricle has no regional wall motion abnormalities. The average left ventricular global longitudinal strain is -15.5 %. Strain was performed and the global longitudinal strain is abnormal. The left ventricular internal cavity size was normal in size. There is mild left ventricular hypertrophy. Left ventricular diastolic parameters are consistent with Grade I diastolic dysfunction (impaired relaxation). Right Ventricle: The right ventricular size is normal. No increase in right ventricular wall thickness. Right ventricular systolic function is normal. Left Atrium: Left atrial size was normal in size. Right Atrium: Right atrial size was normal in size. Pericardium: There is no evidence of pericardial effusion. Mitral Valve: The mitral valve is normal in structure. Mild mitral annular calcification. No evidence of mitral valve regurgitation. No evidence of mitral valve stenosis. MV peak gradient, 8.3 mmHg. The mean mitral valve gradient is 4.0 mmHg. Tricuspid  Valve: The tricuspid valve is normal in structure. Tricuspid valve regurgitation is not demonstrated. No evidence of tricuspid stenosis. Aortic Valve: The aortic valve is normal in structure. Aortic valve regurgitation is not visualized. No aortic stenosis is present. Aortic valve mean gradient measures 3.0 mmHg. Aortic valve peak gradient measures 6.7 mmHg. Aortic valve area, by VTI measures 2.61 cm. Pulmonic Valve: The pulmonic valve was normal in structure. Pulmonic valve regurgitation is trivial. No evidence of pulmonic stenosis. Aorta: The aortic root is normal in size and structure. Venous: The inferior vena cava is normal in size with greater than 50% respiratory variability, suggesting right atrial pressure of 3 mmHg. IAS/Shunts: No atrial level shunt detected by color flow Doppler.  LEFT VENTRICLE PLAX 2D LVIDd:         4.00 cm      Diastology LVIDs:         3.00 cm      LV e' medial:    5.97 cm/s LV PW:         1.20 cm      LV E/e' medial:  14.2 LV IVS:        1.30 cm      LV e' lateral:   8.39 cm/s LVOT diam:     1.90 cm      LV E/e' lateral: 10.1 LV SV:         62 LV SV Index:   29           2D Longitudinal Strain LVOT Area:     2.84 cm     2D Strain GLS Avg:     -15.5 %  LV Volumes (MOD) LV vol d, MOD A2C: 183.0 ml LV vol d, MOD A4C: 146.0 ml LV vol s, MOD A2C: 59.3 ml LV vol s, MOD A4C: 50.8 ml LV SV MOD  A2C:     123.7 ml LV SV MOD A4C:     146.0 ml LV SV MOD BP:      107.6 ml RIGHT VENTRICLE             IVC RV Basal diam:  3.40 cm     IVC diam: 1.50 cm RV S prime:     24.40 cm/s TAPSE (M-mode): 2.8 cm LEFT ATRIUM             Index        RIGHT ATRIUM          Index LA diam:        3.90 cm 1.84 cm/m   RA Area:     9.95 cm LA Vol (A2C):   44.3 ml 20.88 ml/m  RA Volume:   19.90 ml 9.38 ml/m LA Vol (A4C):   41.1 ml 19.37 ml/m LA Biplane Vol: 44.4 ml 20.93 ml/m  AORTIC VALVE                    PULMONIC VALVE AV Area (Vmax):    2.57 cm     PV Vmax:          1.33 m/s AV Area (Vmean):   2.27 cm      PV Peak grad:     7.1 mmHg AV Area (VTI):     2.61 cm     PR End Diast Vel: 2.81 msec AV Vmax:           129.00 cm/s AV Vmean:          76.800 cm/s AV VTI:            0.236 m AV Peak Grad:      6.7 mmHg AV Mean Grad:      3.0 mmHg LVOT Vmax:         117.00 cm/s LVOT Vmean:        61.600 cm/s LVOT VTI:          0.217 m LVOT/AV VTI ratio: 0.92  AORTA Ao Root diam: 2.60 cm Ao Asc diam:  3.40 cm MITRAL VALVE                TRICUSPID VALVE MV Area (PHT): 4.99 cm     TR Peak grad:   32.7 mmHg MV Area VTI:   2.04 cm     TR Vmax:        286.00 cm/s MV Peak grad:  8.3 mmHg MV Mean grad:  4.0 mmHg     SHUNTS MV Vmax:       1.44 m/s     Systemic VTI:  0.22 m MV Vmean:      89.4 cm/s    Systemic Diam: 1.90 cm MV Decel Time: 152 msec MV E velocity: 85.00 cm/s MV A velocity: 149.00 cm/s MV E/A ratio:  0.57 Oneil Parchment MD Electronically signed by Oneil Parchment MD Signature Date/Time: 03/29/2024/3:50:25 PM    Final    NM Pulmonary Perfusion Result Date: 03/29/2024 CLINICAL DATA:  Pulmonary embolism (PE) suspected, low to intermediate prob, positive D-dimer History of pulmonary embolism. EXAM: NUCLEAR MEDICINE PERFUSION LUNG SCAN TECHNIQUE: Perfusion images were obtained in multiple projections after intravenous injection of radiopharmaceutical. No ventilation imaging performed. RADIOPHARMACEUTICALS:  3.2 mCi Tc-78m MAA IV COMPARISON:  Chest radiographs 03/28/2024.  Chest CTA 04/30/2021. FINDINGS: Low lung volumes with elevation of the right hemidiaphragm. Lateral and oblique views are limited by body habitus. There are no wedge-shaped perfusion  defects to suggest acute pulmonary embolism. IMPRESSION: No evidence of acute pulmonary embolism on perfusion scintigraphy by PISAPED criteria. Electronically Signed   By: Elsie Perone M.D.   On: 03/29/2024 14:27    Patient Profile     89 y.o. female with a hx of PAF, HTN, DM, former tobacco use, HLD, chronic HFmrEF, bilateral PE in 2022 with RV strain, CKD stage 3b, LBBB,  diverticulosis, pulmonary nodule, ectatic infrarenal aorta (2.7cm in 02/2023), aortic atherosclerosis, coronary calcification on CT who is being seen 03/29/2024 for the evaluation of PAF at the request of Dr. Kelley.   Assessment & Plan    Acute on chronic hypoxemic Respiratory Failure Acute on Chronic HFmrEF Elevated DDIMER -has had CHF exacerbations in the past with known mild LV dysfunction: EF 45-60% by echo 2023 -developed progressive LE edema while essentially bedridden in a recliner for 5 weeks after a fall -admitted with acute hypoxemia although patient denied any SOB -ddimer elevated >>VQ scan with no PE -elevated BNP 698 and Cxray c/w volume overload -Echo 03/29/24: EF 60-65%, G1DD, mild LVH, normal RV, RAP . -currently on Lasix  40mg  IV BID -UOP 1.9 L yesterday and net - 2.8 L since admit; SCr 1.73>1.67> 1.68; K+ 3.9; Mag 2.2; TSH 3.1 -Nearing euvolemia and serum creatinine has bumped -Transition IV Lasix  to p.o. 40 mg twice daily -Add TED hose -follow strict I&O's; daily weights and renal function -check LE venous dopplers to rule out DVT given her bedridden state for 5 weeks >> currently being done   Paroxysmal Atrial Fibrillation -dx in 2023 -Eliquis  has been recommended on multiple occasions but patient has not followed through -CHADS2VASC score is 7 -appears to have ST with frequent PACs on tele  -Developed GI bleed this morning and Eliquis  has been stopped -Given additional Toprol  XL 25 mg this morning for elevated heart rate and blood pressure but now having a lower GI bleed so we will hold off on increasing Toprol  to 50 mg daily starting tomorrow and just keep a 25 mg daily for now  Acute GI bleed - Occurred this morning in the setting of Eliquis  use - Developed large maroon bowel movement x 2 in the setting of epigastric pain this a.m. - GI feels likely related to diverticular bleeding - Eliquis  has been stopped - Plan is for tagged RBC scan today but patient  has refused and wants to wait to see if she has another episode of bleeding - I have talked with GI concerning ongoing risk of cardioembolic events with A-fib and the need to determine etiology of bleed to help manage her A-fib.   HTN -Remains elevated at 164/85 mmHg earlier this morning and given additional dose of Toprol -XL 25 mg daily due to elevated blood pressure and elevated heart rate -was supposed to be on BB at home but was not taking - Now having lower GI bleed so we will actually hold on increasing Toprol  to 50 mg daily and keep a 25 mg daily   AKI on CKD 3b -baseline 1.2>>2.2>>follows with nephrology -SCr today 1.68 -follow closely with diuresis   Elevated Troponin Coronary artery Calcifications on Chest CT HLD -minimal increase in hsTrop  39>>37 not c/w ACS -likely demand ischemia in setting of acute CHF -she does have coronary Cal on Chest CT and reduced EF but denies any chest pain -given her comorbidities including essentially bedbound, CKD 3b and lack of anginal sx, recommend medical management -2D echo with normal LVF -no ASA due to DOAC -Lipids  03/23/2024: LDL 44, HDL 45, triglycerides 83 -continue Pravastatin  20mg  daily   PAD -prior CT imaging has shown atherosclerotic calcifications and infrarenal aortic ectasia as well as patchy areas of peripheral intraluminal thrombus/hematomas in the abdominal aorta  -vascular surgery reviewed CT images and felt chronic aortic atherosclerotic changes with no further imaging needed  I spent 35 minutes caring for this patient today face to face, ordering and reviewing labs, reviewing records from 2D echo, seeing the patient, documenting in the record,      For questions or updates, please contact [facility] Please consult [email or web address] for contact info under        Signed, Wilbert Bihari, MD  03/31/2024, 11:43 AM

## 2024-03-31 NOTE — Progress Notes (Signed)
 [facility]                                                                              Pamela [name], is a 89 y.o. female, DOB - [birth year], FMW:[id number] Admit date - 03/28/2024    Outpatient Primary MD for the patient is [name], MD  LOS - 3  days  Chief Complaint  Patient presents with   Fall   Impaired Mobility        Brief summary   Patient is 89 year old female with diabetes mellitus type 2, CKD stage IV, anemia of chronic disease, history of A-fib, PE off Eliquis , combined systolic and diastolic CHF presented to ED with worsening lower extremity swelling and inability to walk.  Patient reported that she had a fall which made it difficult for her to walk and she has been mainly sitting in the recliner with her legs down which made her edema worse.  When she tried to ambulate she became profoundly more weaker, has been needing help to go to the bathroom.  She also feels her abdomen is swollen.  Worsening shortness of breath, not on O2 at baseline however in the ED required up to 4 L O2 via [place]. She also had a fall 5 weeks ago and was noted to have leg swelling and redness with possible cellulitis and was placed on Keflex which she has completed the course. Usually on Lasix , was changed to torsemide and feels not working. In ED, felt somewhat improved after getting IV Lasix   Assessment & Plan    Acute on chronic combined systolic and diastolic CHF Acute respiratory failure with hypoxia - 2D echo in 12/2021 had shown EF of 45 to 50%, G2 DD, global hypokinesis.   - Presented with lower extremity edema,  hypoxia, essentially bedridden in the recliner for 5 weeks after a fall.  Although denies overt shortness of breath however was found to have hypoxia, placed on 4 L O2 via [place], elevated BNP and chest x-ray with volume overload - Continue IV Lasix  40 mg every 12 hours - negative balance of 2.9 L, weight down from 255.5 lbs on admission -> 246 today  - Cardiology  following every 12 hours, follow strict I's and O's and daily weights - VQ scan negative for PE, lower extremity venous Dopplers negative for DVT - 2D echo showed EF of 60 to 65%, G1 DD   Acute respiratory failure with hypoxia -History of PE - Continue IV Lasix , wean O2 as tolerated - VQ scan negative for PE, venous Dopplers negative for DVT - Eliquis  now on hold due to GI bleed today  Acute gout - Knee x-ray showed severe tricompartmental osteoarthritis, ankle x-ray negative.  - Uric acid 9.2, CRP 6.1, ESR 42 - Placed on colchicine, prednisone , placed on allopurinol prior to discharge - Recommended to follow-up with Ortho (she follows with [facility] and has received ESI injections in the knees in the past)  Acute lower GI bleed - Patient noted to have bloody BM today, eliquis  placed on hold - Unable to obtain CTA GI bleed due to CKD, ordered tagged RBC scan - Clear liquid diet, serial  H&H, transfuse for hemoglobin less than 8 - GI consulted  Paroxysmal atrial fibrillation - Diagnosed in 2023,CHADS2VASC score is 7  -Has been on the Lopressor  in the past, but patient stopped it, currently NSR - Eliquis  now placed on hold  Generalized debility, venous stasis dermatitis - Patient has completed course of Keflex, appears to have venous stasis dermatitis, hence hold off antibiotics.  If worsening redness, fevers chills, leukocytosis then will consider antibiotics. - Lower extremity edema, redness improving   AKI on CKD stage IIIb - Presented with creatinine of 1.6, trended up to 1.7  - Creatinine improving    Elevated troponin, hyperlipidemia -  likely demand ischemia, no acute chest pain - Cardiology following.  PAD, coronary artery calcifications on chest CT - Blood panel showed cholesterol 106, LDL 44 - Vascular surgery was consulted by cardiology, Dr. Gretta and felt chronic aortic atherosclerotic changes with no further imaging needed, and do not preclude anticoagulation.     Nutrition Problem: Inadequate oral intake Etiology: acute illness  Interventions: Ensure Enlive (each supplement provides 350kcal and 20 grams of protein), MVI, Refer to RD note for recommendations  Obesity class III Estimated body mass index is 45.16 kg/m as calculated from the following:   Height as of this encounter: 5' 2 (1.575 m).   Weight as of this encounter: 112 kg.  Code Status: DNR DVT Prophylaxis:     Level of Care: Level of care: Progressive Family Communication: Updated patient's husband at the bedside today Disposition Plan:      Remains inpatient appropriate:      Procedures:  2D echo VQ scan  Consultants:   Cardiology  Antimicrobials:   Anti-infectives (From admission, onward)    None          Medications  (feeding supplement) PROSource Plus  30 mL Oral Daily   [START ON 04/01/2024] colchicine  0.6 mg Oral Daily   feeding supplement  237 mL Oral BID BM   furosemide   40 mg Intravenous Q12H   insulin  aspart  0-9 Units Subcutaneous Q4H   [START ON 04/01/2024] metoprolol  succinate  25 mg Oral Daily   multivitamin with minerals  1 tablet Oral Daily   pantoprazole   40 mg Oral BID   pravastatin   20 mg Oral q1800   [START ON 04/01/2024] predniSONE   40 mg Oral QAC breakfast   sodium chloride  flush  3 mL Intravenous Q12H      Subjective:   Analiese [name] was seen and examined today.  Seen this morning, complaining of knee pain, ankle pain, subsequently had 2 episodes of bloody BM.  No acute chest pain, shortness of breath, fevers or chills, nausea or vomiting.  No abdominal pain.  Objective:   Vitals:   03/31/24 0424 03/31/24 0845 03/31/24 1200 03/31/24 1211  BP: (!) 164/85 (!) 164/85  132/68  Pulse: 95 95  83  Resp: 18   20  Temp: (!) 97.5 F (36.4 C)   100.3 F (37.9 C)  TempSrc:    Oral  SpO2: 98%   95%  Weight:   112 kg   Height:        Intake/Output Summary (Last 24 hours) at 03/31/2024 1315 Last data filed at 03/31/2024  1200 Gross per 24 hour  Intake 1200 ml  Output 1450 ml  Net -250 ml     Wt Readings from Last 3 Encounters:  03/31/24 112 kg  02/23/23 117.8 kg  02/16/22 117.8 kg    Physical Exam General: Alert and oriented  x 3, NAD Cardiovascular: S1 S2 clear, RRR.  Respiratory: CTAB, no wheezing Gastrointestinal: Soft, nontender, nondistended, NBS Ext: no pedal edema bilaterally Neuro: no new deficits Psych: Normal affect      Data Reviewed:  I have personally reviewed following labs    CBC Lab Results  Component Value Date   WBC 11.9 (H) 03/31/2024   RBC 4.38 03/31/2024   HGB 11.4 (L) 03/31/2024   HCT 35.7 (L) 03/31/2024   MCV 97.7 03/31/2024   MCH 29.9 03/31/2024   PLT 207 03/31/2024   MCHC 30.6 03/31/2024   RDW 14.2 03/31/2024   LYMPHSABS 1.1 03/28/2024   MONOABS 0.7 03/28/2024   EOSABS 0.1 03/28/2024   BASOSABS 0.1 03/28/2024     Last metabolic panel Lab Results  Component Value Date   NA 137 03/31/2024   K 3.9 03/31/2024   CL 95 (L) 03/31/2024   CO2 31 03/31/2024   BUN 43 (H) 03/31/2024   CREATININE 1.68 (H) 03/31/2024   GLUCOSE 102 (H) 03/31/2024   GFRNONAA 29 (L) 03/31/2024   GFRAA 23 (L) 10/11/2019   CALCIUM  9.0 03/31/2024   PHOS 3.1 03/31/2024   PROT 6.9 03/29/2024   ALBUMIN  3.2 (L) 03/31/2024   BILITOT 0.5 03/29/2024   ALKPHOS 79 03/29/2024   AST 19 03/29/2024   ALT 10 03/29/2024   ANIONGAP 11 03/31/2024    CBG (last 3)  Recent Labs    03/31/24 0416 03/31/24 0738 03/31/24 1157  GLUCAP 115* 121* 217*      Coagulation Profile: No results for input(s): INR, PROTIME in the last 168 hours.   Radiology Studies: I have personally reviewed the imaging studies  VAS US  LOWER EXTREMITY VENOUS (DVT) Result Date: 03/31/2024  Lower Venous DVT Study Patient Name:  KUUIPO ANZALDO  Date of Exam:   03/31/2024 Medical Rec #: [id number]       Accession #:    [id number] Date of Birth: 05-Jan-1935        Patient Gender: F Patient Age:   37 years Exam  Location:  [facility] Procedure:      VAS US  LOWER EXTREMITY VENOUS (DVT) Referring Phys: BLEASE DOUTOVA --------------------------------------------------------------------------------  Indications: Swelling, Pain, and pulmonary embolism.  Risk Factors: Confirmed PE obesity. Anticoagulation: Eliquis . Comparison Study: 02/16/22 -Negative left Lower ext                   01/01/22 - Negative right lwer ext Performing Technologist: Ricka Sturdivant-Jones RDMS, RVT  Examination Guidelines: A complete evaluation includes B-mode imaging, spectral Doppler, color Doppler, and power Doppler as needed of all accessible portions of each vessel. Bilateral testing is considered an integral part of a complete examination. Limited examinations for reoccurring indications may be performed as noted. The reflux portion of the exam is performed with the patient in reverse Trendelenburg.  +---------+---------------+---------+-----------+----------+--------------+ RIGHT    CompressibilityPhasicitySpontaneityPropertiesThrombus Aging +---------+---------------+---------+-----------+----------+--------------+ CFV      Full           Yes      Yes                                 +---------+---------------+---------+-----------+----------+--------------+ SFJ      Full                                                        +---------+---------------+---------+-----------+----------+--------------+  FV Prox  Full                                                        +---------+---------------+---------+-----------+----------+--------------+ FV Mid   Full           Yes      Yes                                 +---------+---------------+---------+-----------+----------+--------------+ FV DistalFull                                                        +---------+---------------+---------+-----------+----------+--------------+ PFV      Full                                                         +---------+---------------+---------+-----------+----------+--------------+ POP      Full           Yes      Yes                                 +---------+---------------+---------+-----------+----------+--------------+ PTV      Full                                                        +---------+---------------+---------+-----------+----------+--------------+ PERO     Full                                                        +---------+---------------+---------+-----------+----------+--------------+   +---------+---------------+---------+-----------+----------+--------------+ LEFT     CompressibilityPhasicitySpontaneityPropertiesThrombus Aging +---------+---------------+---------+-----------+----------+--------------+ CFV      Full           Yes      Yes                                 +---------+---------------+---------+-----------+----------+--------------+ SFJ      Full                                                        +---------+---------------+---------+-----------+----------+--------------+ FV Prox  Full                                                        +---------+---------------+---------+-----------+----------+--------------+  FV Mid   Full           Yes      Yes                                 +---------+---------------+---------+-----------+----------+--------------+ FV DistalFull                                                        +---------+---------------+---------+-----------+----------+--------------+ PFV      Full                                                        +---------+---------------+---------+-----------+----------+--------------+ POP      Full           Yes      Yes                                 +---------+---------------+---------+-----------+----------+--------------+ PTV      Full                                                         +---------+---------------+---------+-----------+----------+--------------+ PERO     Full                                                        +---------+---------------+---------+-----------+----------+--------------+     Summary: BILATERAL: - No evidence of deep vein thrombosis seen in the lower extremities, bilaterally. -No evidence of popliteal cyst, bilaterally.   *See table(s) above for measurements and observations.    Preliminary    DG Knee Right Port Result Date: 03/30/2024 EXAM: 1 or 2 VIEW(S) XRAY OF THE KNEE 03/30/2024 05:56:00 PM COMPARISON: None available. CLINICAL HISTORY: Pain 144615. Fall and pain. FINDINGS: BONES AND JOINTS: No acute fracture. No focal osseous lesion. No joint dislocation. Small joint effusion. Moderate to severe tricompartmental degenerative changes with joint space narrowing and osteophyte formation. SOFT TISSUES: Vascular calcifications. IMPRESSION: 1. Moderate to severe tricompartmental osteoarthritis with joint space narrowing and osteophyte formation. 2. Small knee joint effusion. Electronically signed by: Norman Gatlin MD 03/30/2024 07:23 PM EDT RP Workstation: [id number]   DG Ankle Right Port Result Date: 03/30/2024 EXAM: 1 or 2 VIEW(S) XRAY OF THE ANKLE 03/30/2024 05:56:00 PM CLINICAL HISTORY: Pain 855384. Fall and pain. COMPARISON: None available. FINDINGS: BONES AND JOINTS: No acute fracture. No focal osseous lesion. No joint dislocation. Plantar calcaneal spur. Tibiotalar joint space narrowing with marginal osteophytes. SOFT TISSUES: Subcutaneous soft tissue edema. IMPRESSION: 1. No acute osseous abnormality. Electronically signed by: Norman Gatlin MD 03/30/2024 07:22 PM EDT RP Workstation: [id number]   ECHOCARDIOGRAM COMPLETE Result Date: 03/29/2024    ECHOCARDIOGRAM REPORT   Patient Name:   ADREAN FINDLAY Date of Exam: 03/29/2024  Medical Rec #:  [id number]      Height:       62.0 in Accession #:    [id number]     Weight:       255.5 lb Date of Birth:   04-Mar-1935       BSA:          2.121 m Patient Age:    89 years       BP:           150/77 mmHg Patient Gender: F              HR:           96 bpm. Exam Location:  Inpatient Procedure: 2D Echo, Cardiac Doppler, Color Doppler and Strain Analysis (Both            Spectral and Color Flow Doppler were utilized during procedure). Indications:    CHF I50.21  History:        Patient has prior history of Echocardiogram examinations, most                 recent 12/29/2021. CHF, Signs/Symptoms:Edema and Shortness of                 Breath; Risk Factors:Diabetes.  Sonographer:    BERNARDA ROCKS Referring Phys: Domingo.Diones ANASTASSIA DOUTOVA IMPRESSIONS  1. Left ventricular ejection fraction, by estimation, is 60 to 65%. The left ventricle has normal function. The left ventricle has no regional wall motion abnormalities. There is mild left ventricular hypertrophy. Left ventricular diastolic parameters are consistent with Grade I diastolic dysfunction (impaired relaxation). The average left ventricular global longitudinal strain is -15.5 %. The global longitudinal strain is abnormal.  2. Right ventricular systolic function is normal. The right ventricular size is normal.  3. The mitral valve is normal in structure. No evidence of mitral valve regurgitation. No evidence of mitral stenosis.  4. The aortic valve is normal in structure. Aortic valve regurgitation is not visualized. No aortic stenosis is present.  5. The inferior vena cava is normal in size with greater than 50% respiratory variability, suggesting right atrial pressure of 3 mmHg. FINDINGS  Left Ventricle: Left ventricular ejection fraction, by estimation, is 60 to 65%. The left ventricle has normal function. The left ventricle has no regional wall motion abnormalities. The average left ventricular global longitudinal strain is -15.5 %. Strain was performed and the global longitudinal strain is abnormal. The left ventricular internal cavity size was normal in size. There is  mild left ventricular hypertrophy. Left ventricular diastolic parameters are consistent with Grade I diastolic dysfunction (impaired relaxation). Right Ventricle: The right ventricular size is normal. No increase in right ventricular wall thickness. Right ventricular systolic function is normal. Left Atrium: Left atrial size was normal in size. Right Atrium: Right atrial size was normal in size. Pericardium: There is no evidence of pericardial effusion. Mitral Valve: The mitral valve is normal in structure. Mild mitral annular calcification. No evidence of mitral valve regurgitation. No evidence of mitral valve stenosis. MV peak gradient, 8.3 mmHg. The mean mitral valve gradient is 4.0 mmHg. Tricuspid Valve: The tricuspid valve is normal in structure. Tricuspid valve regurgitation is not demonstrated. No evidence of tricuspid stenosis. Aortic Valve: The aortic valve is normal in structure. Aortic valve regurgitation is not visualized. No aortic stenosis is present. Aortic valve mean gradient measures 3.0 mmHg. Aortic valve peak gradient measures 6.7 mmHg. Aortic valve area, by VTI measures 2.61 cm. Pulmonic Valve: The pulmonic valve  was normal in structure. Pulmonic valve regurgitation is trivial. No evidence of pulmonic stenosis. Aorta: The aortic root is normal in size and structure. Venous: The inferior vena cava is normal in size with greater than 50% respiratory variability, suggesting right atrial pressure of 3 mmHg. IAS/Shunts: No atrial level shunt detected by color flow Doppler.  LEFT VENTRICLE PLAX 2D LVIDd:         4.00 cm      Diastology LVIDs:         3.00 cm      LV e' medial:    5.97 cm/s LV PW:         1.20 cm      LV E/e' medial:  14.2 LV IVS:        1.30 cm      LV e' lateral:   8.39 cm/s LVOT diam:     1.90 cm      LV E/e' lateral: 10.1 LV SV:         62 LV SV Index:   29           2D Longitudinal Strain LVOT Area:     2.84 cm     2D Strain GLS Avg:     -15.5 %  LV Volumes (MOD) LV vol d, MOD  A2C: 183.0 ml LV vol d, MOD A4C: 146.0 ml LV vol s, MOD A2C: 59.3 ml LV vol s, MOD A4C: 50.8 ml LV SV MOD A2C:     123.7 ml LV SV MOD A4C:     146.0 ml LV SV MOD BP:      107.6 ml RIGHT VENTRICLE             IVC RV Basal diam:  3.40 cm     IVC diam: 1.50 cm RV S prime:     24.40 cm/s TAPSE (M-mode): 2.8 cm LEFT ATRIUM             Index        RIGHT ATRIUM          Index LA diam:        3.90 cm 1.84 cm/m   RA Area:     9.95 cm LA Vol (A2C):   44.3 ml 20.88 ml/m  RA Volume:   19.90 ml 9.38 ml/m LA Vol (A4C):   41.1 ml 19.37 ml/m LA Biplane Vol: 44.4 ml 20.93 ml/m  AORTIC VALVE                    PULMONIC VALVE AV Area (Vmax):    2.57 cm     PV Vmax:          1.33 m/s AV Area (Vmean):   2.27 cm     PV Peak grad:     7.1 mmHg AV Area (VTI):     2.61 cm     PR End Diast Vel: 2.81 msec AV Vmax:           129.00 cm/s AV Vmean:          76.800 cm/s AV VTI:            0.236 m AV Peak Grad:      6.7 mmHg AV Mean Grad:      3.0 mmHg LVOT Vmax:         117.00 cm/s LVOT Vmean:        61.600 cm/s LVOT VTI:          0.217 m LVOT/AV  VTI ratio: 0.92  AORTA Ao Root diam: 2.60 cm Ao Asc diam:  3.40 cm MITRAL VALVE                TRICUSPID VALVE MV Area (PHT): 4.99 cm     TR Peak grad:   32.7 mmHg MV Area VTI:   2.04 cm     TR Vmax:        286.00 cm/s MV Peak grad:  8.3 mmHg MV Mean grad:  4.0 mmHg     SHUNTS MV Vmax:       1.44 m/s     Systemic VTI:  0.22 m MV Vmean:      89.4 cm/s    Systemic Diam: 1.90 cm MV Decel Time: 152 msec MV E velocity: 85.00 cm/s MV A velocity: 149.00 cm/s MV E/A ratio:  0.57 Oneil Parchment MD Electronically signed by Oneil Parchment MD Signature Date/Time: 03/29/2024/3:50:25 PM    Final    NM Pulmonary Perfusion Result Date: 03/29/2024 CLINICAL DATA:  Pulmonary embolism (PE) suspected, low to intermediate prob, positive D-dimer History of pulmonary embolism. EXAM: NUCLEAR MEDICINE PERFUSION LUNG SCAN TECHNIQUE: Perfusion images were obtained in multiple projections after intravenous injection of  radiopharmaceutical. No ventilation imaging performed. RADIOPHARMACEUTICALS:  3.2 mCi Tc-52m MAA IV COMPARISON:  Chest radiographs 03/28/2024.  Chest CTA 04/30/2021. FINDINGS: Low lung volumes with elevation of the right hemidiaphragm. Lateral and oblique views are limited by body habitus. There are no wedge-shaped perfusion defects to suggest acute pulmonary embolism. IMPRESSION: No evidence of acute pulmonary embolism on perfusion scintigraphy by PISAPED criteria. Electronically Signed   By: Elsie Perone M.D.   On: 03/29/2024 14:27       Enyah Moman M.D. [facility] 03/31/2024, 1:15 PM  Available via Epic secure chat 7am-7pm After 7 pm, please refer to night coverage provider listed on amion.

## 2024-03-31 NOTE — Consult Note (Addendum)
 [facility] Gastroenterology Consult Note   History HANNAN TETZLAFF MRN # [id number]  Date of Admission: 03/28/2024 Date of Consultation: 03/31/2024 Referring physician: Dr. Davia, Nydia POUR, MD Primary Care Provider: [name], MD Primary Gastroenterologist: Formerly Dr. Gwendlyn Boy GI   Reason for Consultation/Chief Complaint: Hematochezia  Subjective  HPI:  This is an 89 year old woman admitted 3 days ago for progressive volume overload with pulmonary edema, hypoxia and marked lower extremity edema after having been essentially immobile at home for 5 weeks after a fall.  She has a history of systolic and diastolic heart failure, A-fib, PE in 2022 (not on anticoagulation for years prior to this admission), found to have elevated troponin on admission that is now declining.  Cardiology consult felt this was demand ischemia in the setting of CKD and other acute issues. She was seen in cardiology consult, notes of which were reviewed.  Echocardiogram this admission showed EF 60 to 65% with grade 1 diastolic dysfunction, VQ scan negative for PE.  She has diuresed well down over 2.5 L since admission.  Eliquis  was resumed for her A-fib.  Earlier this morning she passed a large maroon bowel movement without having abdominal pain nausea vomiting hematemesis or other localizing GI symptoms. She just had another episode before I arrived to see her (send I witnessed the character of this bleeding since she had not yet been cleaned up)  ROS:  Constitutional: Recent weight gain Generalized weakness Cardiovascular:   No chest pain Respiratory:   She was dyspneic on admission which has improved with diuresis Musculoskeletal:   Arthralgias Peripheral edema as noted above  All other systems are negative except as noted above in the HPI  Past Medical History Past Medical History:  Diagnosis Date   Arthritis    Bilateral lower extremity edema    left lower extremity cellulitis    Chronic heart failure with mildly reduced ejection fraction (HFmrEF, 41-49%) (HCC)    Common bile duct stone 06/22/2017   Diabetes (HCC)    Diverticulosis    Duodenal diverticulum, periampullary 11/03/2018   Aborted ERCP 2016   GERD (gastroesophageal reflux disease)    History of hiatal hernia    Hypertension    Kidney disease, chronic, stage III (GFR 30-59 ml/min) (HCC)    Obesity    Osteopenia    PAF (paroxysmal atrial fibrillation) (HCC)    Seizures (HCC) 22 yrs ago   no cause found   Tubular adenoma of colon 2009    Past Surgical History Past Surgical History:  Procedure Laterality Date   ABDOMINAL HYSTERECTOMY     APPENDECTOMY     CHOLECYSTECTOMY OPEN  1977   COLONOSCOPY WITH PROPOFOL  N/A 10/16/2014   Procedure: COLONOSCOPY WITH PROPOFOL ;  Surgeon: Gwendlyn ONEIDA Buddy, MD;  Location: [facility] ENDOSCOPY;  Service: Endoscopy;  Laterality: N/A;   ENDOSCOPIC RETROGRADE CHOLANGIOPANCREATOGRAPHY (ERCP) WITH PROPOFOL  N/A 06/02/2017   Procedure: ENDOSCOPIC RETROGRADE CHOLANGIOPANCREATOGRAPHY (ERCP) WITH PROPOFOL ;  Surgeon: Buddy Gwendlyn ONEIDA, MD;  Location: [facility] ENDOSCOPY;  Service: Endoscopy;  Laterality: N/A;   TONSILLECTOMY      Family History Family History  Problem Relation Age of Onset   Dementia Sister    Hypertension Brother     Social History Social History   Socioeconomic History   Marital status: Married    Spouse name: Not on file   Number of children: Not on file   Years of education: Not on file   Highest education level: Not on file  Occupational History   Not on  file  Tobacco Use   Smoking status: Former    Current packs/day: 1.00    Average packs/day: 1 pack/day for 20.0 years (20.0 ttl pk-yrs)    Types: Cigarettes   Smokeless tobacco: Never   Tobacco comments:    quit smoking 30 yrs ago  Vaping Use   Vaping status: Never Used  Substance and Sexual Activity   Alcohol use: No   Drug use: No   Sexual activity: Not on file  Other Topics Concern   Not on  file  Social History Narrative   Not on file   Social Drivers of Health   Financial Resource Strain: Not on file  Food Insecurity: No Food Insecurity (03/29/2024)   Hunger Vital Sign    Worried About Running Out of Food in the Last Year: Never true    Ran Out of Food in the Last Year: Never true  Transportation Needs: No Transportation Needs (03/29/2024)   PRAPARE - Administrator, Civil Service (Medical): No    Lack of Transportation (Non-Medical): No  Physical Activity: Not on file  Stress: Not on file  Social Connections: Unknown (03/29/2024)   Social Connection and Isolation Panel    Frequency of Communication with Friends and Family: Not on file    Frequency of Social Gatherings with Friends and Family: Not on file    Attends Religious Services: Not on file    Active Member of Clubs or Organizations: Not on file    Attends Banker Meetings: Never    Marital Status: Not on file  Recent Concern: Social Connections - Moderately Isolated (03/29/2024)   Social Connection and Isolation Panel    Frequency of Communication with Friends and Family: More than three times a week    Frequency of Social Gatherings with Friends and Family: More than three times a week    Attends Religious Services: Never    Database administrator or Organizations: No    Attends Engineer, structural: Not on file    Marital Status: Married    Allergies Allergies  Allergen Reactions   Levaquin [Levofloxacin In D5w] Nausea And Vomiting   Levofloxacin Nausea And Vomiting and Other (See Comments)    Abdominal pain, also   Ensure Diarrhea   Lisinopril Cough   Oxycodone  Nausea Only    Outpatient Meds Home medications from the H+P and/or nursing med reconciliation reviewed.  Inpatient med list reviewed  _____________________________________________________________________ Objective   Exam:  Current vital signs  Patient Vitals for the past 8 hrs:  BP Temp Pulse  Resp SpO2  03/31/24 0845 (!) 164/85 -- 95 -- --  03/31/24 0424 (!) 164/85 (!) 97.5 F (36.4 C) 95 18 98 %   Has been in sinus tach with PACs  Intake/Output Summary (Last 24 hours) at 03/31/2024 1135 Last data filed at 03/31/2024 0912 Gross per 24 hour  Intake 1200 ml  Output 1200 ml  Net 0 ml    Physical Exam: She is laying in bed alert and conversational, pleasant and somewhat anxious appearing.  Her husband is at the bedside as well Eyes: sclera anicteric, no redness ENT: oral mucosa moist without lesions, no cervical or supraclavicular lymphadenopathy, good dentition CV: Heart sounds somewhat distant but regular,  marked bilateral peripheral edema Resp: Breathing comfortably on room air.  Low inspiratory effort GI: soft, morbidly obese, no focal tenderness, with active bowel sounds. No guarding or palpable organomegaly noted Skin; warm and dry, no rash or jaundice noted  Neuro: awake, alert and oriented x 3. Normal gross upper and lower extremity motor function and fluent speech.  Labs:     Latest Ref Rng & Units 03/31/2024    5:00 AM 03/30/2024    5:29 AM 03/29/2024    1:11 AM  CBC  WBC 4.0 - 10.5 K/uL 11.9  11.1  9.6   Hemoglobin 12.0 - 15.0 g/dL 86.8  86.4  86.5   Hematocrit 36.0 - 46.0 % 42.8  43.2  41.3   Platelets 150 - 400 K/uL 207  206  224        Latest Ref Rng & Units 03/31/2024    5:00 AM 03/30/2024    5:29 AM 03/29/2024    1:11 AM  CMP  Glucose 70 - 99 mg/dL 897  96  895   BUN 8 - 23 mg/dL 43  38  44   Creatinine 0.44 - 1.00 mg/dL 8.31  8.32  8.26   Sodium 135 - 145 mmol/L 137  141  141   Potassium 3.5 - 5.1 mmol/L 3.9  3.7  4.1   Chloride 98 - 111 mmol/L 95  99  101   CO2 22 - 32 mmol/L 31  30  26    Calcium  8.9 - 10.3 mg/dL 9.0  9.1  9.3   Total Protein 6.5 - 8.1 g/dL   6.9   Total Bilirubin 0.0 - 1.2 mg/dL   0.5   Alkaline Phos 38 - 126 U/L   79   AST 15 - 41 U/L   19   ALT 0 - 44 U/L   10     No results for input(s): INR in the last 168  hours. _________________________________________________________ Radiologic studies:  VQ scan report reviewed and on file  Echocardiogram report from this admission reviewed and on file  Portable chest x-ray report from admission reviewed and on file ______________________________________________________ Other studies:  Her last colonoscopy with Dr. Aneita was in April 2016, when diverticulosis was found in the transverse and left colon _______________________________________________________ Assessment & Plan  Impression:  Hematochezia, likely diverticular bleeding based on overall clinical scenario. Her hemodynamic stability speaks against a brisk upper GI bleed.  No aspirin or NSAID use  Resumed anticoagulation this admission for history of A-fib, clearly exacerbating the bleeding Washout time will be somewhat prolonged with underlying CKD (perhaps as long as 24 hours)  I discussed my clinical suspicion with Bellamie and her husband, she is understandably concerned and frustrated by another clinical development given that she feels quite poorly.  I explained the nature of diverticular bleeding and that there is the possibility it may stop as the anticoagulation washes out. Nevertheless, I think she should have an imaging study to try and localize it to see if interventional radiology is required. Her low GFR increases the risk of contrast mediated nephropathy, so I recommend she undergo a tagged RBC scan today after communicating with the radiology department and be sure there is a Pensions consultant available.  She does not want to do that at this point, and says she has had so many tests and it is very difficult for her to lay on a table in the radiology department, even just for plain x-rays she had of her ankle and knee yesterday. She would prefer to wait and see if she has another episode of bleeding later today and if so, she may then be agreeable to the bleeding scan.  She needs her vital  signs checked every 2 hours, and I  have placed that order.  There is also not been a hemoglobin and hematocrit or CBC drawn since the bleeding started earlier today, so I spoke with the nursing about that and placed an order for every 6 hour hemoglobin and hematocrit x 4.  Pantoprazole  40 mg currently once daily by mouth.  Will increase to twice daily.  Keep on clear liquid diet for now  Hospitalist updated.  Dr. Albertus will be on call for our practice until this evening, which point Dr. Stacia will be on overnight for further questions and advice.  This consultation required a high degree of medical decision making due to the nature and complexity of the acute condition(s) being evaluated as well as the patient's medical comorbidities.  Victory LITTIE Brand III Office: [phone]

## 2024-03-31 NOTE — Evaluation (Signed)
 Occupational Therapy Evaluation Patient Details Name: Pamela [name] MRN: [id number] DOB: January 04, 1935 Today's Date: 03/31/2024   History of Present Illness   patient is a 89 year old female who presented from home 03/28/24 with inability to stand and pivot. . Patient has had 2 falls and has been nonambulatory x ~ 5 weeks.Patient was admitted  chronic systolic CHF., hypoxia.. During hospitalization, beginning today 9/28, pt has had blood in stool as well diarrhea. Venous Dopplar on 9/28 showed No evidence of deep vein thrombosis seen in the lower extremities, bilaterally.   PMHC: HF, arthritis of L knee, hypertension, T2DM, HFrEF, CKD stage IIIb, hyperlipidemia and pulmonary embolism     Clinical Impressions Patient is currently requiring as high as Total assistance with basic ADLs, as well as  moderate assist with bed mobility and inability to fully stand today with Max assist with use of Stedy x 2 attempts.   Current level of function is below patient's typical baseline.    During this evaluation, patient was limited by body habitus, chronic knee OA pain, generalized weakness, impaired activity tolerance, and anxiety with fear avoidance, all of which has the potential to impact patient's and/or caregivers' safety and independence during functional mobility, as well as performance for ADLs.    Patient lives with her spouse who  able to provide 24/7 supervision and assistance.  They have no other help available and spouse at risk of injury assisting pt. Patient demonstrates good to fair rehab potential, and should benefit from continued skilled occupational therapy services while in acute care to maximize safety, independence and quality of life at home.  Continued occupational therapy services after discharge from acute care from continued inpatient follow up therapy, <3 hours/day is recommended.   ?      If plan is discharge home, recommend the following:   A lot of help with walking and/or  transfers;A lot of help with bathing/dressing/bathroom;Two people to help with walking and/or transfers;Two people to help with bathing/dressing/bathroom;Assist for transportation;Assistance with cooking/housework     Functional Status Assessment   Patient has had a recent decline in their functional status and demonstrates the ability to make significant improvements in function in a reasonable and predictable amount of time.     Equipment Recommendations    (TBD)     Recommendations for Other Services         Precautions/Restrictions   Precautions Precautions: Fall Precaution/Restrictions Comments: monitor sats Restrictions Weight Bearing Restrictions Per Provider Order: No     Mobility Bed Mobility Overal bed mobility: Needs Assistance Bed Mobility: Supine to Sit, Sit to Supine, Rolling Rolling: Min assist, Mod assist, Used rails   Supine to sit: Mod assist, HOB elevated, Used rails Sit to supine: Mod assist, +2 for physical assistance        Transfers                          Balance Overall balance assessment: Needs assistance Sitting-balance support: Feet supported, No upper extremity supported Sitting balance-Leahy Scale: Fair Sitting balance - Comments: Must lean back due to body habitus at EOB. Risk of sliding off bed.   Standing balance support: Bilateral upper extremity supported, During functional activity, Reliant on assistive device for balance Standing balance-Leahy Scale: Zero                             ADL either performed or assessed with clinical  judgement   ADL Overall ADL's : Needs assistance/impaired Eating/Feeding: Set up;Bed level   Grooming: Set up;Bed level   Upper Body Bathing: Minimal assistance;Sitting;Moderate assistance   Lower Body Bathing: Total assistance;Bed level   Upper Body Dressing : Minimal assistance;Sitting   Lower Body Dressing: Total assistance;Bed level     Toilet Transfer  Details (indicate cue type and reason): Unable to come to full stand to Camie Meek today despite elevated EOB, Max As and 2 attempts. Pt would cear hips but not push self to upright. Lowered self to EOB each attempt. Toileting- Clothing Manipulation and Hygiene: Total assistance;Bed level Toileting - Clothing Manipulation Details (indicate cue type and reason): Bloody coffee ground stool incontinence with mobility.     Functional mobility during ADLs: Moderate assistance;Maximal assistance;Cueing for sequencing;Cueing for safety;+2 for physical assistance       Vision Baseline Vision/History: 1 Wears glasses Ability to See in Adequate Light: 0 Adequate Patient Visual Report: No change from baseline Vision Assessment?: No apparent visual deficits     Perception         Praxis         Pertinent Vitals/Pain Pain Assessment Pain Assessment: No/denies pain Pain Location: Recently pain in ankles and RT knee worse than Left. Pt reports medication recently and now no pain. Pain Intervention(s): Monitored during session, Premedicated before session     Extremity/Trunk Assessment Upper Extremity Assessment Upper Extremity Assessment: Generalized weakness;Right hand dominant RUE Deficits / Details: tremor RUE Coordination: decreased fine motor LUE Deficits / Details: tremor LUE Coordination: decreased fine motor   Lower Extremity Assessment Lower Extremity Assessment: Generalized weakness RLE Deficits / Details: lower leg discoloration, edema, 3+/5 strength LLE Deficits / Details: lower leg discoloration, 3+/5 strength       Communication Communication Communication: No apparent difficulties   Cognition Arousal: Alert Behavior During Therapy: Anxious Cognition: No apparent impairments             OT - Cognition Comments: Aware of anxiety. Pt wth tremors that do seem to exacerbate when pt doing scary things such as trying to stand.                 Following  commands: Intact       Cueing  General Comments   Cueing Techniques: Verbal cues      Exercises     Shoulder Instructions      Home Living Family/patient expects to be discharged to:: Private residence Living Arrangements: Spouse/significant other Available Help at Discharge: Family;Available 24 hours/day Type of Home: House Home Access: Stairs to enter [employer] of Steps: 1   Home Layout: One level     Bathroom Shower/Tub: Producer, television/film/video: Handicapped height     Home Equipment: Agricultural consultant (2 wheels);Rollator (4 wheels);Wheelchair - manual;Shower seat   Additional Comments: 3 wheeled walker      Prior Functioning/Environment Prior Level of Function : Needs assist             Mobility Comments: has not amb. x 5 weeks, spouse assisting transfers from lift recliner to [facility], rolls to shower and transfers to seat. But has been unabe to enter shower for weeks and husband assists with chair level bath ADLs Comments: spouse has been assisting with ADL's including sponge bathing recliner level, and LB dressing.    OT Problem List: Decreased strength;Decreased knowledge of use of DME or AE;Increased edema;Obesity;Decreased coordination;Decreased range of motion;Decreased activity tolerance;Pain;Impaired balance (sitting and/or standing)   OT Treatment/Interventions:  Self-care/ADL training;Balance training;Therapeutic exercise;Therapeutic activities;DME and/or AE instruction;Patient/family education      OT Goals(Current goals can be found in the care plan section)   Acute Rehab OT Goals Patient Stated Goal: Go home if possible. OT Goal Formulation: With patient/family Time For Goal Achievement: 04/14/24 Potential to Achieve Goals: Fair ADL Goals Pt Will Perform Lower Body Dressing: with min assist;with adaptive equipment;with caregiver independent in assisting;sitting/lateral leans;sit to/from stand Pt Will Transfer to Toilet:  squat pivot transfer;stand pivot transfer;bedside commode;with mod assist Pt Will Perform Toileting - Clothing Manipulation and hygiene: with min assist;with caregiver independent in assisting;sitting/lateral leans;sit to/from stand;with adaptive equipment Pt/caregiver will Perform Home Exercise Program: Increased ROM;Increased strength;Both right and left upper extremity;With Supervision Additional ADL Goal #1: Pt will verbalize and demonstrate at least 2 methods to manage anxiety prior to mobilizing being deep breathing, medication, or use of sart phone for guided visualization in order to increase safety and tolerance with functional mobility. Additional ADL Goal #2: Pt will improve sitting balance to good static and fair+ dynamic and tolerate 20 min of EOB sitting while engaging in light UB ADLs without physical assistance other than setup in order to increase safety and participation during seated ADLs.   OT Frequency:  Min 2X/week    Co-evaluation              AM-PAC OT 6 Clicks Daily Activity     Outcome Measure Help from another person eating meals?: A Little Help from another person taking care of personal grooming?: A Little Help from another person toileting, which includes using toliet, bedpan, or urinal?: Total Help from another person bathing (including washing, rinsing, drying)?: A Lot Help from another person to put on and taking off regular upper body clothing?: A Lot Help from another person to put on and taking off regular lower body clothing?: Total 6 Click Score: 12   End of Session Equipment Utilized During Treatment: Gait belt;Oxygen  (stedy) Nurse Communication: Other (comment) (RNs to room to assist with return to bed.)  Activity Tolerance: Patient limited by fatigue;Patient limited by pain (knee pain with stand attempts. W/B 6/10) Patient left: with nursing/sitter in room;with bed alarm set;with family/visitor present;in bed  OT Visit Diagnosis: Unsteadiness  on feet (R26.81);Pain;Muscle weakness (generalized) (M62.81) Pain - Right/Left:  (Bil) Pain - part of body: Knee                Time: 8571-8472 OT Time Calculation (min): 59 min Charges:  OT General Charges $OT Visit: 1 Visit OT Evaluation $OT Eval Moderate Complexity: 1 Mod OT Treatments $Self Care/Home Management : 8-22 mins $Therapeutic Activity: 23-37 mins  Delon, OT Acute Rehab Services Office: [phone] 03/31/2024   Delon Falter 03/31/2024, 4:27 PM

## 2024-03-31 NOTE — Progress Notes (Signed)
 Pt with another bloody BM, total 3 since this AM. VS stable, pt asymptomatic so far.

## 2024-04-01 ENCOUNTER — Inpatient Hospital Stay (HOSPITAL_COMMUNITY): Admitting: Anesthesiology

## 2024-04-01 ENCOUNTER — Encounter (HOSPITAL_COMMUNITY)
Admission: EM | Disposition: A | Discharge status: Home Health | Payer: Self-pay | Source: Home / Self Care | Attending: Internal Medicine

## 2024-04-01 DIAGNOSIS — K921 Melena: Secondary | ICD-10-CM

## 2024-04-01 DIAGNOSIS — K259 Gastric ulcer, unspecified as acute or chronic, without hemorrhage or perforation: Secondary | ICD-10-CM

## 2024-04-01 DIAGNOSIS — N183 Chronic kidney disease, stage 3 unspecified: Secondary | ICD-10-CM

## 2024-04-01 DIAGNOSIS — D62 Acute posthemorrhagic anemia: Secondary | ICD-10-CM

## 2024-04-01 DIAGNOSIS — I130 Hypertensive heart and chronic kidney disease with heart failure and stage 1 through stage 4 chronic kidney disease, or unspecified chronic kidney disease: Secondary | ICD-10-CM

## 2024-04-01 DIAGNOSIS — R262 Difficulty in walking, not elsewhere classified: Secondary | ICD-10-CM | POA: Diagnosis not present

## 2024-04-01 DIAGNOSIS — K922 Gastrointestinal hemorrhage, unspecified: Secondary | ICD-10-CM

## 2024-04-01 DIAGNOSIS — K269 Duodenal ulcer, unspecified as acute or chronic, without hemorrhage or perforation: Secondary | ICD-10-CM

## 2024-04-01 DIAGNOSIS — I5031 Acute diastolic (congestive) heart failure: Secondary | ICD-10-CM | POA: Diagnosis not present

## 2024-04-01 DIAGNOSIS — K2970 Gastritis, unspecified, without bleeding: Secondary | ICD-10-CM

## 2024-04-01 DIAGNOSIS — K5710 Diverticulosis of small intestine without perforation or abscess without bleeding: Secondary | ICD-10-CM

## 2024-04-01 DIAGNOSIS — R531 Weakness: Secondary | ICD-10-CM | POA: Diagnosis not present

## 2024-04-01 DIAGNOSIS — R600 Localized edema: Secondary | ICD-10-CM | POA: Diagnosis not present

## 2024-04-01 DIAGNOSIS — R0902 Hypoxemia: Secondary | ICD-10-CM | POA: Diagnosis not present

## 2024-04-01 DIAGNOSIS — I5023 Acute on chronic systolic (congestive) heart failure: Secondary | ICD-10-CM | POA: Diagnosis not present

## 2024-04-01 HISTORY — PX: ESOPHAGOGASTRODUODENOSCOPY: SHX5428

## 2024-04-01 LAB — RENAL FUNCTION PANEL
Albumin: 3 g/dL — ABNORMAL LOW (ref 3.5–5.0)
Anion gap: 11 (ref 5–15)
BUN: 70 mg/dL — ABNORMAL HIGH (ref 8–23)
CO2: 29 mmol/L (ref 22–32)
Calcium: 8.6 mg/dL — ABNORMAL LOW (ref 8.9–10.3)
Chloride: 97 mmol/L — ABNORMAL LOW (ref 98–111)
Creatinine, Ser: 1.86 mg/dL — ABNORMAL HIGH (ref 0.44–1.00)
GFR, Estimated: 26 mL/min — ABNORMAL LOW (ref >60)
Glucose, Bld: 139 mg/dL — ABNORMAL HIGH (ref 70–99)
Phosphorus: 2 mg/dL — ABNORMAL LOW (ref 2.5–4.6)
Potassium: 4.4 mmol/L (ref 3.5–5.1)
Sodium: 137 mmol/L (ref 135–145)

## 2024-04-01 LAB — CBC
HCT: 30.7 % — ABNORMAL LOW (ref 36.0–46.0)
Hemoglobin: 10 g/dL — ABNORMAL LOW (ref 12.0–15.0)
MCH: 30.8 pg (ref 26.0–34.0)
MCHC: 32.6 g/dL (ref 30.0–36.0)
MCV: 94.5 fL (ref 80.0–100.0)
Platelets: 181 K/uL (ref 150–400)
RBC: 3.25 MIL/uL — ABNORMAL LOW (ref 3.87–5.11)
RDW: 13.8 % (ref 11.5–15.5)
WBC: 17.3 K/uL — ABNORMAL HIGH (ref 4.0–10.5)
nRBC: 0 % (ref 0.0–0.2)

## 2024-04-01 LAB — GLUCOSE, CAPILLARY
Glucose-Capillary: 107 mg/dL — ABNORMAL HIGH (ref 70–99)
Glucose-Capillary: 114 mg/dL — ABNORMAL HIGH (ref 70–99)
Glucose-Capillary: 122 mg/dL — ABNORMAL HIGH (ref 70–99)
Glucose-Capillary: 146 mg/dL — ABNORMAL HIGH (ref 70–99)
Glucose-Capillary: 153 mg/dL — ABNORMAL HIGH (ref 70–99)
Glucose-Capillary: 162 mg/dL — ABNORMAL HIGH (ref 70–99)

## 2024-04-01 LAB — HEMOGLOBIN AND HEMATOCRIT, BLOOD
HCT: 29.7 % — ABNORMAL LOW (ref 36.0–46.0)
Hemoglobin: 9.6 g/dL — ABNORMAL LOW (ref 12.0–15.0)

## 2024-04-01 LAB — MAGNESIUM: Magnesium: 2.1 mg/dL (ref 1.7–2.4)

## 2024-04-01 SURGERY — EGD (ESOPHAGOGASTRODUODENOSCOPY)
Anesthesia: Monitor Anesthesia Care

## 2024-04-01 MED ORDER — PROPOFOL 500 MG/50ML IV EMUL
INTRAVENOUS | Status: DC | PRN
  Administered 2024-04-01: 100 ug/kg/min via INTRAVENOUS

## 2024-04-01 MED ORDER — LIDOCAINE HCL (PF) 2 % IJ SOLN
INTRAMUSCULAR | Status: DC | PRN
  Administered 2024-04-01 (×2): 50 mg via INTRADERMAL

## 2024-04-01 MED ORDER — SODIUM CHLORIDE 0.9 % IV SOLN: INTRAVENOUS | Status: AC

## 2024-04-01 MED ORDER — FUROSEMIDE 40 MG PO TABS: 40.0000 mg | ORAL_TABLET | Freq: Every day | ORAL | Status: DC

## 2024-04-01 MED ORDER — PANTOPRAZOLE SODIUM 40 MG IV SOLR
40.0000 mg | Freq: Two times a day (BID) | INTRAVENOUS | Status: DC
  Administered 2024-04-01 – 2024-04-04 (×6): 40 mg via INTRAVENOUS
  Filled 2024-04-01 (×6): qty 10

## 2024-04-01 MED ORDER — PHENYLEPHRINE 80 MCG/ML (10ML) SYRINGE FOR IV PUSH (FOR BLOOD PRESSURE SUPPORT)
PREFILLED_SYRINGE | INTRAVENOUS | Status: DC | PRN
  Administered 2024-04-01: 60 ug via INTRAVENOUS
  Administered 2024-04-01 (×2): 40 ug via INTRAVENOUS

## 2024-04-01 MED ORDER — SODIUM CHLORIDE 0.9 % IV SOLN: INTRAVENOUS | Status: DC | PRN

## 2024-04-01 MED ORDER — SIMETHICONE 80 MG PO CHEW
160.0000 mg | CHEWABLE_TABLET | Freq: Four times a day (QID) | ORAL | Status: DC | PRN
  Filled 2024-04-01: qty 2

## 2024-04-01 MED ORDER — PROPOFOL 10 MG/ML IV BOLUS
INTRAVENOUS | Status: DC | PRN
  Administered 2024-04-01 (×2): 30 mg via INTRAVENOUS

## 2024-04-01 NOTE — Progress Notes (Addendum)
 Progress Note   Subjective  Chief Complaint: Hematochezia  Today, patient found with her husband by her bedside.  They tell me that she has had nothing but good news today.  Actually got taken off of her oxygen .  Tells me the last time she had a bloody bowel movement was yesterday evening late around 10 or 11 PM, it was still quite bloody.  Since then she has had nothing.  Her Eliquis  was stopped again yesterday (last dose at 9:30 AM on 03/30/2024).    Objective   Vital signs in last 24 hours: Temp:  [97.5 F (36.4 C)-100.3 F (37.9 C)] 97.5 F (36.4 C) (09/29 0734) Pulse Rate:  [67-105] 68 (09/29 0734) Resp:  [16-20] 16 (09/29 0734) BP: (117-151)/(57-85) 117/85 (09/29 0734) SpO2:  [89 %-98 %] 98 % (09/29 0734) Weight:  [111.4 kg-112 kg] 111.4 kg (09/29 0356) Last BM Date : 03/31/24 General:   Elderly obese white female in NAD Heart:  Regular rate and rhythm; no murmurs Lungs: Respirations even and unlabored, lungs CTA bilaterally Abdomen:  Soft, nontender and nondistended. Increased BS all four quadrants Psych:  Cooperative. Normal mood and affect.  Intake/Output from previous day: 09/28 0701 - 09/29 0700 In: 360 [P.O.:360] Out: 250 [Urine:250] Intake/Output this shift: Total I/O In: 100 [P.O.:100] Out: -   Lab Results: Recent Labs    03/30/24 0529 03/31/24 0500 03/31/24 1217 03/31/24 1653 04/01/24 0007 04/01/24 0510  WBC 11.1* 11.9*  --   --  17.3*  --   HGB 13.5 13.1   < > 11.0* 10.0* 9.6*  HCT 43.2 42.8   < > 34.2* 30.7* 29.7*  PLT 206 207  --   --  181  --    < > = values in this interval not displayed.   BMET Recent Labs    03/30/24 0529 03/31/24 0500 04/01/24 0007  NA 141 137 137  K 3.7 3.9 4.4  CL 99 95* 97*  CO2 30 31 29   GLUCOSE 96 102* 139*  BUN 38* 43* 70*  CREATININE 1.67* 1.68* 1.86*  CALCIUM  9.1 9.0 8.6*   LFT Recent Labs    04/01/24 0007  ALBUMIN  3.0*   Studies/Results: VAS US  LOWER EXTREMITY VENOUS (DVT) Result Date:  03/31/2024  Lower Venous DVT Study Patient Name:  CHANTI GOLUBSKI  Date of Exam:   03/31/2024 Medical Rec #: [id number]       Accession #:    [id number] Date of Birth: 1934-08-25        Patient Gender: F Patient Age:   10 years Exam Location:  [facility] Procedure:      VAS US  LOWER EXTREMITY VENOUS (DVT) Referring Phys: BLEASE DOUTOVA --------------------------------------------------------------------------------  Indications: Swelling, Pain, and pulmonary embolism.  Risk Factors: Confirmed PE obesity. Anticoagulation: Eliquis . Comparison Study: 02/16/22 -Negative left Lower ext                   01/01/22 - Negative right lwer ext Performing Technologist: Ricka Sturdivant-Jones RDMS, RVT  Examination Guidelines: A complete evaluation includes B-mode imaging, spectral Doppler, color Doppler, and power Doppler as needed of all accessible portions of each vessel. Bilateral testing is considered an integral part of a complete examination. Limited examinations for reoccurring indications may be performed as noted. The reflux portion of the exam is performed with the patient in reverse Trendelenburg.  +---------+---------------+---------+-----------+----------+--------------+ RIGHT    CompressibilityPhasicitySpontaneityPropertiesThrombus Aging +---------+---------------+---------+-----------+----------+--------------+ CFV      Full  Yes      Yes                                 +---------+---------------+---------+-----------+----------+--------------+ SFJ      Full                                                        +---------+---------------+---------+-----------+----------+--------------+ FV Prox  Full                                                        +---------+---------------+---------+-----------+----------+--------------+ FV Mid   Full           Yes      Yes                                  +---------+---------------+---------+-----------+----------+--------------+ FV DistalFull                                                        +---------+---------------+---------+-----------+----------+--------------+ PFV      Full                                                        +---------+---------------+---------+-----------+----------+--------------+ POP      Full           Yes      Yes                                 +---------+---------------+---------+-----------+----------+--------------+ PTV      Full                                                        +---------+---------------+---------+-----------+----------+--------------+ PERO     Full                                                        +---------+---------------+---------+-----------+----------+--------------+   +---------+---------------+---------+-----------+----------+--------------+ LEFT     CompressibilityPhasicitySpontaneityPropertiesThrombus Aging +---------+---------------+---------+-----------+----------+--------------+ CFV      Full           Yes      Yes                                 +---------+---------------+---------+-----------+----------+--------------+ SFJ      Full                                                        +---------+---------------+---------+-----------+----------+--------------+  FV Prox  Full                                                        +---------+---------------+---------+-----------+----------+--------------+ FV Mid   Full           Yes      Yes                                 +---------+---------------+---------+-----------+----------+--------------+ FV DistalFull                                                        +---------+---------------+---------+-----------+----------+--------------+ PFV      Full                                                         +---------+---------------+---------+-----------+----------+--------------+ POP      Full           Yes      Yes                                 +---------+---------------+---------+-----------+----------+--------------+ PTV      Full                                                        +---------+---------------+---------+-----------+----------+--------------+ PERO     Full                                                        +---------+---------------+---------+-----------+----------+--------------+     Summary: BILATERAL: - No evidence of deep vein thrombosis seen in the lower extremities, bilaterally. -No evidence of popliteal cyst, bilaterally.   *See table(s) above for measurements and observations. Electronically signed by Lonni Gaskins MD on 03/31/2024 at 2:39:33 PM.    Final    DG Knee Right Port Result Date: 03/30/2024 EXAM: 1 or 2 VIEW(S) XRAY OF THE KNEE 03/30/2024 05:56:00 PM COMPARISON: None available. CLINICAL HISTORY: Pain 144615. Fall and pain. FINDINGS: BONES AND JOINTS: No acute fracture. No focal osseous lesion. No joint dislocation. Small joint effusion. Moderate to severe tricompartmental degenerative changes with joint space narrowing and osteophyte formation. SOFT TISSUES: Vascular calcifications. IMPRESSION: 1. Moderate to severe tricompartmental osteoarthritis with joint space narrowing and osteophyte formation. 2. Small knee joint effusion. Electronically signed by: Norman Gatlin MD 03/30/2024 07:23 PM EDT RP Workstation: [id number]   DG Ankle Right Port Result Date: 03/30/2024 EXAM: 1 or 2 VIEW(S) XRAY OF THE ANKLE 03/30/2024 05:56:00 PM CLINICAL HISTORY: Pain 855384. Fall and pain. COMPARISON: None available. FINDINGS: BONES AND  JOINTS: No acute fracture. No focal osseous lesion. No joint dislocation. Plantar calcaneal spur. Tibiotalar joint space narrowing with marginal osteophytes. SOFT TISSUES: Subcutaneous soft tissue edema. IMPRESSION: 1. No acute  osseous abnormality. Electronically signed by: Norman Gatlin MD 03/30/2024 07:22 PM EDT RP Workstation: [id number]     Assessment / Plan:   Assessment: 1.  Hematochezia: Initially thought diverticular based on the overall clinical scenario, patient declined nuc med bleeding scan due to the length of this exam and could not undergo CTA given GFR, continued bleeding a total of 10 times yesterday a mixture of bright red blood and maroon-colored blood, hemoglobin dramatically dropped from 13.1-9.6 overnight; concern for upper versus lower GI bleed 2.  A-fib: Typically on Eliquis , last dose 03/30/2024 at 9:30 AM 3.  CKD 4.  Acute on chronic combined systolic and diastolic CHF with acute respiratory failure and hypoxia: 2D echo 6/23 with an EF of 45-50%, patient has been on Lasix  and diuresed appropriately, came off of oxygen  this morning 5.  Acute gout: Knee x-ray showed severe tricompartmental osteoarthritis, placed on colchicine and prednisone  6.  Generalized debility/venous stasis dermatitis  Plan: 1.  Plan for an EGD with possible enteroscopy today for further evaluation of GI bleed.  This is scheduled with Dr. Suzann at 1:30pm.  Did discuss risks, benefits, limitations and alternatives and the patient agrees to proceed. 2.  Patient will be n.p.o. until after time of procedure 3.  Continue to hold Eliquis  4.  Continue to monitor hemoglobin with transfusion as needed less than 7 5.  Pending results from EGD and enteroscopy could consider a colonoscopy in the coming days. 6.  Please await further recommendations from Dr. Suzann after time of procedure.  Thank you for your kind consultation, we will continue to follow.   LOS: 4 days   Delon Hendricks Failing  04/01/2024, 9:25 AM

## 2024-04-01 NOTE — Plan of Care (Signed)
  Problem: Education: Goal: Ability to describe self-care measures that may prevent or decrease complications (Diabetes Survival Skills Education) will improve Outcome: Not Progressing   Problem: Coping: Goal: Ability to adjust to condition or change in health will improve Outcome: Not Progressing   Problem: Nutritional: Goal: Progress toward achieving an optimal weight will improve Outcome: Not Progressing   Problem: Skin Integrity: Goal: Risk for impaired skin integrity will decrease Outcome: Not Progressing   Problem: Activity: Goal: Risk for activity intolerance will decrease Outcome: Not Progressing   Problem: Coping: Goal: Level of anxiety will decrease Outcome: Not Progressing

## 2024-04-01 NOTE — Anesthesia Preprocedure Evaluation (Addendum)
 Anesthesia Evaluation  Patient identified by MRN, date of birth, ID band Patient awake    Reviewed: Allergy & Precautions, NPO status , Patient's Chart, lab work & pertinent test results  History of Anesthesia Complications Negative for: history of anesthetic complications  Airway Mallampati: II  TM Distance: >3 FB Neck ROM: Full    Dental no notable dental hx. (+) Teeth Intact, Dental Advisory Given   Pulmonary former smoker, PE   Pulmonary exam normal breath sounds clear to auscultation       Cardiovascular hypertension, Pt. on medications and Pt. on home beta blockers +CHF  Normal cardiovascular exam+ dysrhythmias Atrial Fibrillation  Rhythm:Regular Rate:Normal  03/29/2024 TTE 1. Left ventricular ejection fraction, by estimation, is 60 to 65%. The  left ventricle has normal function. The left ventricle has no regional  wall motion abnormalities. There is mild left ventricular hypertrophy.  Left ventricular diastolic parameters  are consistent with Grade I diastolic dysfunction (impaired relaxation).  The average left ventricular global longitudinal strain is -15.5 %. The  global longitudinal strain is abnormal.   2. Right ventricular systolic function is normal. The right ventricular  size is normal.   3. The mitral valve is normal in structure. No evidence of mitral valve  regurgitation. No evidence of mitral stenosis.   4. The aortic valve is normal in structure. Aortic valve regurgitation is  not visualized. No aortic stenosis is present.   5. The inferior vena cava is normal in size with greater than 50%  respiratory variability, suggesting right atrial pressure of 3 mmHg.     Neuro/Psych    GI/Hepatic ,GERD  Medicated,,  Endo/Other  diabetes, Type 2    Renal/GU Renal InsufficiencyRenal diseaseStage IV     Musculoskeletal  (+) Arthritis ,    Abdominal   Peds  Hematology Pt on aphixiban Lab Results       Component                Value               Date                      WBC                      17.3 (H)            04/01/2024                HGB                      9.6 (L)             04/01/2024                HCT                      29.7 (L)            04/01/2024               PLT                      181                 04/01/2024              Anesthesia Other Findings All:lisinopril, levofloxacin, oxycodone   Reproductive/Obstetrics negative OB ROS  Anesthesia Physical Anesthesia Plan  ASA: 3  Anesthesia Plan: MAC   Post-op Pain Management: Minimal or no pain anticipated   Induction: Intravenous  PONV Risk Score and Plan: 2 and Propofol  infusion and Treatment may vary due to age or medical condition  Airway Management Planned: Natural Airway and Nasal Cannula  Additional Equipment: None  Intra-op Plan:   Post-operative Plan:   Informed Consent: I have reviewed the patients History and Physical, chart, labs and discussed the procedure including the risks, benefits and alternatives for the proposed anesthesia with the patient or authorized representative who has indicated his/her understanding and acceptance.   Patient has DNR.  Discussed DNR with patient and Continue DNR.   Dental advisory given  Plan Discussed with: CRNA and Surgeon  Anesthesia Plan Comments: (Enteroscopy for GI bleed  Discussed DNR with patient . Patient desires no chest compressions or heroic measures. I asked he to be allowed to revese the effect of medications that I give her for the procedure . She was ok with this for the procedure. )         Anesthesia Quick Evaluation

## 2024-04-01 NOTE — Progress Notes (Addendum)
  Progress Note  Patient Name: VELECIA OVITT Date of Encounter: 04/01/2024 [facility] Cardiologist: Dorn Lesches, MD   Interval Summary   She is doing much better today. She denies any additional bleeding and also denies CP, dyspnea, or LE edema.   Vital Signs Vitals:   04/01/24 0310 04/01/24 0356 04/01/24 0734 04/01/24 1000  BP: (!) 147/70  117/85   Pulse: 67  68   Resp:   16   Temp: 97.9 F (36.6 C)  (!) 97.5 F (36.4 C)   TempSrc: Oral  Oral   SpO2: 90%  98%   Weight:  111.4 kg  109.7 kg  Height:        Intake/Output Summary (Last 24 hours) at 04/01/2024 1153 Last data filed at 04/01/2024 0800 Gross per 24 hour  Intake 220 ml  Output 250 ml  Net -30 ml      04/01/2024   10:00 AM 04/01/2024    3:56 AM 03/31/2024   12:00 PM  Last 3 Weights  Weight (lbs) 241 lb 13.5 oz 245 lb 9.5 oz 246 lb 14.6 oz  Weight (kg) 109.7 kg 111.4 kg 112 kg      Telemetry/ECG  Afib and high PVC burden - Personally Reviewed  Physical Exam  GEN: No acute distress.   Neck: No JVD Cardiac: irregularly irregular, no murmurs, rubs, or gallops.  Respiratory: Clear to auscultation bilaterally. GI: Soft, nontender, non-distended  MS: No edema  Assessment & Plan  Ms Beitz is a 68 yoF with Hx of PAF not AC due to GIB, bilateral PE with RV strain, CKD3B, coronary calcifications who presented with ADHF and pAF.  #Acute on chronic diastolic failure #pAF not on AC due to GIB #GIB #Hx of bilateral PE w/ RV strain - Presented with volume overload that improved with IV diuresis. TTE with EF 60-65%. Appears asymptomatic and euvolemic today. Worsening AKI likely in setting of GIB and acute anemia.  - Switch IV lasix  to PO lasix  40 daily - Cont toprol  XL 25 mg daily - Appreciate GI input in anemia - Will need ziopatch outpatient to quantify PVC burden and EP referral for Watchman evaluation - Needs to be on statin and SGLT2i but will defer to outpatient  For questions or updates,  please contact [facility] Please consult [email or web address] for contact info under   Signed, Joelle VEAR Ren Donley, MD

## 2024-04-01 NOTE — Transfer of Care (Signed)
 Immediate Anesthesia Transfer of Care Note  Patient: Pamela [name]  Procedure(s) Performed: EGD (ESOPHAGOGASTRODUODENOSCOPY) ENTEROSCOPY  Patient Location: PACU  Anesthesia Type:MAC  Level of Consciousness: drowsy  Airway & Oxygen  Therapy: Patient Spontanous Breathing and Patient connected to nasal cannula oxygen   Post-op Assessment: Report given to RN and Post -op Vital signs reviewed and stable  Post vital signs: Reviewed and stable  Last Vitals:  Vitals Value Taken Time  BP 138/72 04/01/24 14:24  Temp 36.4 C 04/01/24 14:24  Pulse 95 04/01/24 14:25  Resp 18 04/01/24 14:25  SpO2 97 % on [facility] 04/01/24 14:25  Vitals shown include unfiled device data.  Last Pain:  Vitals:   04/01/24 1424  TempSrc: Temporal  PainSc: 0-No pain      Patients Stated Pain Goal: 9 (03/31/24 0851)  Complications: No notable events documented.

## 2024-04-01 NOTE — TOC Progression Note (Signed)
 Transition of Care [facility]) - Progression Note   Patient Details  Name: Pamela [name] MRN: [id number] Date of Birth: 08-03-1934  Transition of [facility]) CM/SW Contact  Duwaine GORMAN Aran, LCSW Phone Number: 04/01/2024, 12:23 PM  Clinical Narrative: CSW met with patient and spouse, Pamela [name], to follow up with SNF vs. HH. Patient continued to politely decline SNF, but is agreeable to going home with HHPT/OT. Patient reported she would like to use the agency she has used before. Per chart review, patient has used [facility]. CSW made [facility] referral to [facility] with [facility], which was accepted. CSW updated patient. HH orders requested.  Expected Discharge Plan: Home w Home Health Services Barriers to Discharge: Continued Medical Work up  Expected Discharge Plan and Services In-house Referral: Clinical Social Work Post Acute Care Choice: Home Health Living arrangements for the past 2 months: Single Family Home             DME Arranged: N/A DME Agency: NA HH Arranged: PT, OT HH Agency: [facility] Date HH Agency Contacted: 04/01/24 Time HH Agency Contacted: 1122 Representative spoke with at [facility] Agency: [place]  Social Drivers of Health (SDOH) Interventions SDOH Screenings   Food Insecurity: No Food Insecurity (03/29/2024)  Housing: Low Risk  (03/29/2024)  Transportation Needs: No Transportation Needs (03/29/2024)  Utilities: Not At Risk (03/29/2024)  Social Connections: Unknown (03/29/2024)  Recent Concern: Social Connections - Moderately Isolated (03/29/2024)  Tobacco Use: Medium Risk (03/29/2024)   Readmission Risk Interventions     No data to display

## 2024-04-01 NOTE — Progress Notes (Signed)
 [facility]                                                                              Pamela [name], is a 89 y.o. female, DOB - February 02, 1935, FMW:[id number] Admit date - 03/28/2024    Outpatient Primary MD for the patient is [name], MD  LOS - 4  days  Chief Complaint  Patient presents with   Fall   Impaired Mobility        Brief summary   Patient is 89 year old female with diabetes mellitus type 2, CKD stage IV, anemia of chronic disease, history of A-fib, PE off Eliquis , combined systolic and diastolic CHF presented to ED with worsening lower extremity swelling and inability to walk.  Patient reported that she had a fall which made it difficult for her to walk and she has been mainly sitting in the recliner with her legs down which made her edema worse.  When she tried to ambulate she became profoundly more weaker, has been needing help to go to the bathroom.  She also feels her abdomen is swollen.  Worsening shortness of breath, not on O2 at baseline however in the ED required up to 4 L O2 via [place]. She also had a fall 5 weeks ago and was noted to have leg swelling and redness with possible cellulitis and was placed on Keflex which she has completed the course. Usually on Lasix , was changed to torsemide and feels not working. In ED, felt somewhat improved after getting IV Lasix   Assessment & Plan    Acute on chronic combined systolic and diastolic CHF Acute respiratory failure with hypoxia - 2D echo in 12/2021 had shown EF of 45 to 50%, G2 DD, global hypokinesis.   - Presented with lower extremity edema,  hypoxia, essentially bedridden in the recliner for 5 weeks after a fall.  Although denies overt shortness of breath however was found to have hypoxia, placed on 4 L O2 via , elevated BNP and chest x-ray with volume overload - Creatinine trending up to 1.8 today, negative balance of 2.5 L - weight down from 255.5 lbs on admission -> 241 today  -Transition to  oral Lasix  40 mg daily starting tomorrow -Cardiology following, Zio patch outpatient - VQ scan negative for PE, lower extremity venous Dopplers negative for DVT - 2D echo showed EF of 60 to 65%, G1 DD   Acute respiratory failure with hypoxia -History of PE - Continue IV Lasix , wean O2 as tolerated - VQ scan negative for PE, venous Dopplers negative for DVT - Eliquis  now on hold due to GI bleed - Wean O2  Acute gout - Knee x-ray showed severe tricompartmental osteoarthritis, ankle x-ray negative.  - Uric acid 9.2, CRP 6.1, ESR 42 - Placed on colchicine, prednisone , placed on allopurinol prior to discharge - Recommended to follow-up with Ortho (she follows with [facility] and has received ESI injections in the knees in the past) - Feels a lot better today with the knee and ankle pain  Acute lower GI bleed - Noted to have multiple rectal bleeding on 9/28, GI consulted.   - Plan for EGD with small  bowel enteroscopy - Hemoglobin 9.6  Paroxysmal atrial fibrillation - Diagnosed in 2023,CHADS2VASC score is 7  -Has been on the Lopressor  in the past, but patient stopped it, currently NSR - Eliquis  now placed on hold  Generalized debility, venous stasis dermatitis - Patient has completed course of Keflex, appears to have venous stasis dermatitis, hence hold off antibiotics.  If worsening redness, fevers chills, leukocytosis then will consider antibiotics. - Lower extremity edema, redness improving   AKI on CKD stage IIIb - Presented with creatinine of 1.6, trended up to 1.7  - Creatinine improving    Elevated troponin, hyperlipidemia -  likely demand ischemia, no acute chest pain - Cardiology following.  PAD, coronary artery calcifications on chest CT - Blood panel showed cholesterol 106, LDL 44 - Vascular surgery was consulted by cardiology, Dr. Gretta and felt chronic aortic atherosclerotic changes with no further imaging needed, and do not preclude anticoagulation.     Nutrition Problem: Inadequate oral intake Etiology: acute illness  Interventions: Ensure Enlive (each supplement provides 350kcal and 20 grams of protein), MVI, Refer to RD note for recommendations  Obesity class III Estimated body mass index is 44.23 kg/m as calculated from the following:   Height as of this encounter: 5' 2 (1.575 m).   Weight as of this encounter: 109.7 kg.  Code Status: DNR DVT Prophylaxis:     Level of Care: Level of care: Progressive Family Communication: Updated patient's husband at the bedside today Disposition Plan:      Remains inpatient appropriate:      Procedures:  2D echo VQ scan  Consultants:   Cardiology  Antimicrobials:   Anti-infectives (From admission, onward)    None          Medications  [MAR Hold] (feeding supplement) PROSource Plus  30 mL Oral Daily   [MAR Hold] colchicine  0.6 mg Oral Daily   [MAR Hold] feeding supplement  237 mL Oral BID BM   [MAR Hold] furosemide   40 mg Oral Daily   [MAR Hold] insulin  aspart  0-9 Units Subcutaneous Q4H   [MAR Hold] metoprolol  succinate  25 mg Oral Daily   [MAR Hold] multivitamin with minerals  1 tablet Oral Daily   [MAR Hold] pantoprazole   40 mg Oral BID   [MAR Hold] pravastatin   20 mg Oral q1800   [MAR Hold] predniSONE   40 mg Oral QAC breakfast   [MAR Hold] sodium chloride  flush  3 mL Intravenous Q12H      Subjective:   Pamela [name] was seen and examined today.  Seen this morning, had bloody BMs yesterday.  None this morning.  No acute chest pain, shortness of breath, lower extremity edema has improved.  Husband at the bedside.  Objective:   Vitals:   04/01/24 0734 04/01/24 1000 04/01/24 1252 04/01/24 1424  BP: 117/85  (!) 160/78 138/72  Pulse: 68  97 97  Resp: 16  (!) 22 19  Temp: (!) 97.5 F (36.4 C)  (!) 94 F (34.4 C) (!) 97.5 F (36.4 C)  TempSrc: Oral  Temporal Temporal  SpO2: 98%  94% 94%  Weight:  109.7 kg    Height:        Intake/Output Summary (Last  24 hours) at 04/01/2024 1425 Last data filed at 04/01/2024 1419 Gross per 24 hour  Intake 400 ml  Output --  Net 400 ml     Wt Readings from Last 3 Encounters:  04/01/24 109.7 kg  02/23/23 117.8 kg  02/16/22 117.8 kg  Physical Exam General: Alert and oriented x 3, NAD Cardiovascular: S1 S2 clear, RRR.  Respiratory: CTAB, no wheezing Gastrointestinal: Soft, nontender, nondistended, NBS Ext: no pedal edema bilaterally Neuro: no new deficits Psych: Normal affect    Data Reviewed:  I have personally reviewed following labs    CBC Lab Results  Component Value Date   WBC 17.3 (H) 04/01/2024   RBC 3.25 (L) 04/01/2024   HGB 9.6 (L) 04/01/2024   HCT 29.7 (L) 04/01/2024   MCV 94.5 04/01/2024   MCH 30.8 04/01/2024   PLT 181 04/01/2024   MCHC 32.6 04/01/2024   RDW 13.8 04/01/2024   LYMPHSABS 1.1 03/28/2024   MONOABS 0.7 03/28/2024   EOSABS 0.1 03/28/2024   BASOSABS 0.1 03/28/2024     Last metabolic panel Lab Results  Component Value Date   NA 137 04/01/2024   K 4.4 04/01/2024   CL 97 (L) 04/01/2024   CO2 29 04/01/2024   BUN 70 (H) 04/01/2024   CREATININE 1.86 (H) 04/01/2024   GLUCOSE 139 (H) 04/01/2024   GFRNONAA 26 (L) 04/01/2024   GFRAA 23 (L) 10/11/2019   CALCIUM  8.6 (L) 04/01/2024   PHOS 2.0 (L) 04/01/2024   PROT 6.9 03/29/2024   ALBUMIN  3.0 (L) 04/01/2024   BILITOT 0.5 03/29/2024   ALKPHOS 79 03/29/2024   AST 19 03/29/2024   ALT 10 03/29/2024   ANIONGAP 11 04/01/2024    CBG (last 3)  Recent Labs    04/01/24 0343 04/01/24 0732 04/01/24 1135  GLUCAP 146* 107* 122*      Coagulation Profile: No results for input(s): INR, PROTIME in the last 168 hours.   Radiology Studies: I have personally reviewed the imaging studies  VAS US  LOWER EXTREMITY VENOUS (DVT) Result Date: 03/31/2024  Lower Venous DVT Study Patient Name:  MAYLEEN BORRERO  Date of Exam:   03/31/2024 Medical Rec #: [id number]       Accession #:    [id number] Date of Birth: May 19, 1935         Patient Gender: F Patient Age:   62 years Exam Location:  [facility] Procedure:      VAS US  LOWER EXTREMITY VENOUS (DVT) Referring Phys: BLEASE DOUTOVA --------------------------------------------------------------------------------  Indications: Swelling, Pain, and pulmonary embolism.  Risk Factors: Confirmed PE obesity. Anticoagulation: Eliquis . Comparison Study: 02/16/22 -Negative left Lower ext                   01/01/22 - Negative right lwer ext Performing Technologist: Ricka Sturdivant-Jones RDMS, RVT  Examination Guidelines: A complete evaluation includes B-mode imaging, spectral Doppler, color Doppler, and power Doppler as needed of all accessible portions of each vessel. Bilateral testing is considered an integral part of a complete examination. Limited examinations for reoccurring indications may be performed as noted. The reflux portion of the exam is performed with the patient in reverse Trendelenburg.  +---------+---------------+---------+-----------+----------+--------------+ RIGHT    CompressibilityPhasicitySpontaneityPropertiesThrombus Aging +---------+---------------+---------+-----------+----------+--------------+ CFV      Full           Yes      Yes                                 +---------+---------------+---------+-----------+----------+--------------+ SFJ      Full                                                        +---------+---------------+---------+-----------+----------+--------------+  FV Prox  Full                                                        +---------+---------------+---------+-----------+----------+--------------+ FV Mid   Full           Yes      Yes                                 +---------+---------------+---------+-----------+----------+--------------+ FV DistalFull                                                        +---------+---------------+---------+-----------+----------+--------------+ PFV      Full                                                         +---------+---------------+---------+-----------+----------+--------------+ POP      Full           Yes      Yes                                 +---------+---------------+---------+-----------+----------+--------------+ PTV      Full                                                        +---------+---------------+---------+-----------+----------+--------------+ PERO     Full                                                        +---------+---------------+---------+-----------+----------+--------------+   +---------+---------------+---------+-----------+----------+--------------+ LEFT     CompressibilityPhasicitySpontaneityPropertiesThrombus Aging +---------+---------------+---------+-----------+----------+--------------+ CFV      Full           Yes      Yes                                 +---------+---------------+---------+-----------+----------+--------------+ SFJ      Full                                                        +---------+---------------+---------+-----------+----------+--------------+ FV Prox  Full                                                        +---------+---------------+---------+-----------+----------+--------------+  FV Mid   Full           Yes      Yes                                 +---------+---------------+---------+-----------+----------+--------------+ FV DistalFull                                                        +---------+---------------+---------+-----------+----------+--------------+ PFV      Full                                                        +---------+---------------+---------+-----------+----------+--------------+ POP      Full           Yes      Yes                                 +---------+---------------+---------+-----------+----------+--------------+ PTV      Full                                                         +---------+---------------+---------+-----------+----------+--------------+ PERO     Full                                                        +---------+---------------+---------+-----------+----------+--------------+     Summary: BILATERAL: - No evidence of deep vein thrombosis seen in the lower extremities, bilaterally. -No evidence of popliteal cyst, bilaterally.   *See table(s) above for measurements and observations. Electronically signed by Lonni Gaskins MD on 03/31/2024 at 2:39:33 PM.    Final    DG Knee Right Port Result Date: 03/30/2024 EXAM: 1 or 2 VIEW(S) XRAY OF THE KNEE 03/30/2024 05:56:00 PM COMPARISON: None available. CLINICAL HISTORY: Pain 144615. Fall and pain. FINDINGS: BONES AND JOINTS: No acute fracture. No focal osseous lesion. No joint dislocation. Small joint effusion. Moderate to severe tricompartmental degenerative changes with joint space narrowing and osteophyte formation. SOFT TISSUES: Vascular calcifications. IMPRESSION: 1. Moderate to severe tricompartmental osteoarthritis with joint space narrowing and osteophyte formation. 2. Small knee joint effusion. Electronically signed by: Norman Gatlin MD 03/30/2024 07:23 PM EDT RP Workstation: [id number]   DG Ankle Right Port Result Date: 03/30/2024 EXAM: 1 or 2 VIEW(S) XRAY OF THE ANKLE 03/30/2024 05:56:00 PM CLINICAL HISTORY: Pain 855384. Fall and pain. COMPARISON: None available. FINDINGS: BONES AND JOINTS: No acute fracture. No focal osseous lesion. No joint dislocation. Plantar calcaneal spur. Tibiotalar joint space narrowing with marginal osteophytes. SOFT TISSUES: Subcutaneous soft tissue edema. IMPRESSION: 1. No acute osseous abnormality. Electronically signed by: Norman Gatlin MD 03/30/2024 07:22 PM EDT RP Workstation: [id number]       Nydia Distance M.D. [facility] 04/01/2024, 2:25 PM  Available via Epic secure chat 7am-7pm After 7 pm, please refer to night coverage provider listed on  amion.

## 2024-04-01 NOTE — Plan of Care (Signed)

## 2024-04-01 NOTE — Op Note (Addendum)
 [facility] Patient Name: Pamela [name] Procedure Date: 04/01/2024 MRN: [id number] Attending MD: Inocente Hausen , MD, [id number] Date of Birth: [birth year] CSN: [id number] Age: 89 Admit Type: Inpatient Procedure:                Upper GI endoscopy Indications:              Hematochezia, Recent gastrointestinal bleeding Providers:                Inocente Hausen, MD, Darleene Bare, RN, Curtistine Bishop, Technician Referring MD:              Medicines:                Monitored Anesthesia Care Complications:            No immediate complications. Estimated blood loss:                            Minimal. Estimated Blood Loss:     Estimated blood loss was minimal. Procedure:                Pre-Anesthesia Assessment:                           - Prior to the procedure, a History and Physical                            was performed, and patient medications and                            allergies were reviewed. The patient's tolerance of                            previous anesthesia was also reviewed. The risks                            and benefits of the procedure and the sedation                            options and risks were discussed with the patient.                            All questions were answered, and informed consent                            was obtained. Prior Anticoagulants: The patient has                            taken Eliquis  (apixaban ), last dose was 1 day prior                            to procedure. ASA Grade Assessment: III - A patient  with severe systemic disease. After reviewing the                            risks and benefits, the patient was deemed in                            satisfactory condition to undergo the procedure.                           After obtaining informed consent, the endoscope was                            passed under direct vision. Throughout the                             procedure, the patient's blood pressure, pulse, and                            oxygen  saturations were monitored continuously. The                            [id number]-[id number] ([id number]) Olympus endoscope was introduced                            through the mouth, and advanced to the second part                            of duodenum. The upper GI endoscopy was                            accomplished without difficulty. The patient                            tolerated the procedure well. Scope In: Scope Out: Findings:      The examined esophagus was normal.      Localized mild inflammation characterized by erythema was found in the       gastric antrum and in the prepyloric region of the stomach.      One non-bleeding cratered gastric ulcer with no stigmata of bleeding was       found in the gastric antrum. The lesion was 6 mm in largest dimension.       For hemostasis, two hemostatic clips were successfully placed.      Biopsies were taken with a cold forceps in the gastric body and in the       gastric antrum for Helicobacter pylori testing.      The duodenal bulb was normal.      A medium non-bleeding diverticulum was found along the duodenal sweep      The second portion of the duodenum was normal. Impression:               - Normal esophagus.                           - Gastritis.                           -  Non-bleeding gastric ulcer with no stigmata of                            bleeding. Clips were placed.                           - Normal duodenal bulb.                           - Non-bleeding duodenal diverticulum along the                            duodenal sweep                           - Normal second portion of the duodenum.                           - Biopsies were taken with a cold forceps for                            Helicobacter pylori testing.                           - Differential diagnosis for the patient's recent                            bleeding could include  bleeding from gastric ulcer                            versus lower GI bleeding from diverticula,                            angioectasias in the lower GI tract. Gastric ulcer                            was not bleeding at the time of EGD and did not                            have stigmata of recent bleeding but could have                            potentially been a culprit. Moderate Sedation:      Not Applicable - Patient had care per Anesthesia. Recommendation:           - Return patient to hospital ward for ongoing care.                           - Recommend changing pantoprazole  to 40 mg IV twice                            daily in the setting of peptic ulcer with potential  for recent bleeding.                           - Continue holding anticoagulation and will                            reassess patient's stability tomorrow to determine                            timing of reintroduction                           - Continue to monitoring serial hemoglobin                            hematocrit, BUN/Cr ratio.                           - Follow-up pathology results evaluating for H.                            pylori and treat for H. pylori if positive.                           - Limit exposure to NSAIDs in the setting of peptic                            ulcer                           - Patient will need repeat EGD in 8 to 12 weeks                            status post hospitalization for confirmation of                            peptic ulcer healing. Procedure Code(s):        --- Professional ---                           [id number], Esophagogastroduodenoscopy, flexible,                            transoral; with biopsy, single or multiple Diagnosis Code(s):        --- Professional ---                           K29.70, Gastritis, unspecified, without bleeding                           K25.9, Gastric ulcer, unspecified as acute or                             chronic, without hemorrhage or perforation  K92.1, Melena (includes Hematochezia)                           K92.2, Gastrointestinal hemorrhage, unspecified                           K57.10, Diverticulosis of small intestine without                            perforation or abscess without bleeding CPT copyright 2022 American Medical Association. All rights reserved. The codes documented in this report are preliminary and upon coder review may  be revised to meet current compliance requirements. Inocente Hausen, MD 04/01/2024 2:32:16 PM This report has been signed electronically. Number of Addenda: 1 Addendum Number: 1   Addendum Date: 04/01/2024 2:46:01 PM      Patient may resume a clear liquid diet Inocente Hausen, MD 04/01/2024 2:46:14 PM This report has been signed electronically.

## 2024-04-01 NOTE — Anesthesia Postprocedure Evaluation (Signed)
 Anesthesia Post Note  Patient: Renay Crammer Sahagun  Procedure(s) Performed: EGD (ESOPHAGOGASTRODUODENOSCOPY) ENTEROSCOPY     Patient location during evaluation: Endoscopy Anesthesia Type: MAC Level of consciousness: awake and alert Pain management: pain level controlled Vital Signs Assessment: post-procedure vital signs reviewed and stable Respiratory status: spontaneous breathing, nonlabored ventilation, respiratory function stable and patient connected to nasal cannula oxygen  Cardiovascular status: blood pressure returned to baseline and stable Postop Assessment: no apparent nausea or vomiting Anesthetic complications: no   No notable events documented.  Last Vitals:  Vitals:   04/01/24 1430 04/01/24 1440  BP: 122/74 (!) 144/53  Pulse: 93 63  Resp: 17 18  Temp:    SpO2: 96% 96%    Last Pain:  Vitals:   04/01/24 1555  TempSrc:   PainSc: 2                  Garnette DELENA Gab

## 2024-04-01 NOTE — Progress Notes (Signed)
 PT Cancellation Note  Patient Details Name: Pamela [name] MRN: [id number] DOB: [birth year]   Cancelled Treatment:    Reason Eval/Treat Not Completed: Patient declined, no reason specified    Dannial SQUIBB, PT Acute Rehabilitation  Office: [phone]

## 2024-04-02 ENCOUNTER — Ambulatory Visit: Payer: Self-pay | Admitting: Pediatrics

## 2024-04-02 ENCOUNTER — Encounter (HOSPITAL_COMMUNITY): Payer: Self-pay | Admitting: Pediatrics

## 2024-04-02 DIAGNOSIS — K259 Gastric ulcer, unspecified as acute or chronic, without hemorrhage or perforation: Secondary | ICD-10-CM

## 2024-04-02 DIAGNOSIS — K922 Gastrointestinal hemorrhage, unspecified: Secondary | ICD-10-CM | POA: Diagnosis not present

## 2024-04-02 DIAGNOSIS — D62 Acute posthemorrhagic anemia: Secondary | ICD-10-CM

## 2024-04-02 DIAGNOSIS — R262 Difficulty in walking, not elsewhere classified: Secondary | ICD-10-CM | POA: Diagnosis not present

## 2024-04-02 DIAGNOSIS — R600 Localized edema: Secondary | ICD-10-CM | POA: Diagnosis not present

## 2024-04-02 DIAGNOSIS — R531 Weakness: Secondary | ICD-10-CM | POA: Diagnosis not present

## 2024-04-02 DIAGNOSIS — R0902 Hypoxemia: Secondary | ICD-10-CM | POA: Diagnosis not present

## 2024-04-02 LAB — CBC
HCT: 30.2 % — ABNORMAL LOW (ref 36.0–46.0)
Hemoglobin: 9.7 g/dL — ABNORMAL LOW (ref 12.0–15.0)
MCH: 31 pg (ref 26.0–34.0)
MCHC: 32.1 g/dL (ref 30.0–36.0)
MCV: 96.5 fL (ref 80.0–100.0)
Platelets: 226 K/uL (ref 150–400)
RBC: 3.13 MIL/uL — ABNORMAL LOW (ref 3.87–5.11)
RDW: 13.9 % (ref 11.5–15.5)
WBC: 14.2 K/uL — ABNORMAL HIGH (ref 4.0–10.5)
nRBC: 0 % (ref 0.0–0.2)

## 2024-04-02 LAB — RENAL FUNCTION PANEL
Albumin: 3 g/dL — ABNORMAL LOW (ref 3.5–5.0)
Anion gap: 12 (ref 5–15)
BUN: 85 mg/dL — ABNORMAL HIGH (ref 8–23)
CO2: 29 mmol/L (ref 22–32)
Calcium: 8.8 mg/dL — ABNORMAL LOW (ref 8.9–10.3)
Chloride: 99 mmol/L (ref 98–111)
Creatinine, Ser: 2.02 mg/dL — ABNORMAL HIGH (ref 0.44–1.00)
GFR, Estimated: 23 mL/min — ABNORMAL LOW (ref >60)
Glucose, Bld: 97 mg/dL (ref 70–99)
Phosphorus: 4.3 mg/dL (ref 2.5–4.6)
Potassium: 4 mmol/L (ref 3.5–5.1)
Sodium: 139 mmol/L (ref 135–145)

## 2024-04-02 LAB — SURGICAL PATHOLOGY

## 2024-04-02 LAB — GLUCOSE, CAPILLARY
Glucose-Capillary: 100 mg/dL — ABNORMAL HIGH (ref 70–99)
Glucose-Capillary: 135 mg/dL — ABNORMAL HIGH (ref 70–99)
Glucose-Capillary: 143 mg/dL — ABNORMAL HIGH (ref 70–99)
Glucose-Capillary: 160 mg/dL — ABNORMAL HIGH (ref 70–99)
Glucose-Capillary: 182 mg/dL — ABNORMAL HIGH (ref 70–99)
Glucose-Capillary: 77 mg/dL (ref 70–99)

## 2024-04-02 MED ORDER — INSULIN ASPART 100 UNIT/ML IJ SOLN: 0.0000 [IU] | Freq: Every day | INTRAMUSCULAR | Status: DC

## 2024-04-02 MED ORDER — INSULIN ASPART 100 UNIT/ML IJ SOLN
0.0000 [IU] | Freq: Three times a day (TID) | INTRAMUSCULAR | Status: DC
  Administered 2024-04-02: 1 [IU] via SUBCUTANEOUS
  Administered 2024-04-02: 2 [IU] via SUBCUTANEOUS
  Administered 2024-04-03: 1 [IU] via SUBCUTANEOUS
  Administered 2024-04-03: 2 [IU] via SUBCUTANEOUS

## 2024-04-02 MED ORDER — ALLOPURINOL 100 MG PO TABS
100.0000 mg | ORAL_TABLET | Freq: Every day | ORAL | Status: DC
  Administered 2024-04-02 – 2024-04-04 (×3): 100 mg via ORAL
  Filled 2024-04-02 (×3): qty 1

## 2024-04-02 MED ORDER — APIXABAN 5 MG PO TABS
5.0000 mg | ORAL_TABLET | Freq: Two times a day (BID) | ORAL | Status: DC
  Administered 2024-04-02 – 2024-04-03 (×3): 5 mg via ORAL
  Filled 2024-04-02 (×3): qty 1

## 2024-04-02 MED ORDER — APIXABAN 2.5 MG PO TABS: 2.5000 mg | ORAL_TABLET | Freq: Two times a day (BID) | ORAL | Status: DC

## 2024-04-02 NOTE — Progress Notes (Signed)
  Progress Note  Patient Name: NADELYN ENRIQUES Date of Encounter: 04/02/2024 [facility] Cardiologist: Dorn Lesches, MD   Interval Summary   Patient underwent EGD yesterday and was found to have nonbleeding gastric ulcer that was treated. She denies any CP or dyspnea. She also denies bleeding per rectum.   Vital Signs Vitals:   04/02/24 0500 04/02/24 0514 04/02/24 0900 04/02/24 0941  BP:  (!) 142/80    Pulse:  89    Resp:  17    Temp:  98.4 F (36.9 C)    TempSrc:      SpO2:  98% (!) 88% 93%  Weight: 110 kg     Height:        Intake/Output Summary (Last 24 hours) at 04/02/2024 1115 Last data filed at 04/02/2024 0800 Gross per 24 hour  Intake 900 ml  Output 1450 ml  Net -550 ml      04/02/2024    5:00 AM 04/01/2024   10:00 AM 04/01/2024    3:56 AM  Last 3 Weights  Weight (lbs) 242 lb 8.1 oz 241 lb 13.5 oz 245 lb 9.5 oz  Weight (kg) 110 kg 109.7 kg 111.4 kg      Telemetry/ECG  NSR and PACs - Personally Reviewed  Physical Exam  GEN: No acute distress.   Neck: No JVD Cardiac: RRR, no murmurs, rubs, or gallops.  Respiratory: Clear to auscultation bilaterally. GI: Soft, nontender, non-distended  MS: No edema  Assessment & Plan  Ms Kelm is a 63 yoF with Hx of PAF not AC due to GIB, bilateral PE with RV strain, CKD3B, coronary calcifications who presented with ADHF and pAF.   #Acute on chronic diastolic failure #pAF not on AC due to GIB #GIB #Hx of bilateral PE w/ RV strain - Presented with volume overload that improved with IV diuresis. TTE with EF 60-65%. Appears asymptomatic and euvolemic today. Worsening AKI likely in setting of GIB and acute anemia.  - Hold diuretics until kidney function improves --> can start lasix  20 mg daily on discharge - Cont toprol  XL 25 mg daily - Plan to trial Eliquis  again- Appreciate GI input - Will need ziopatch outpatient to quantify PVC burden and EP referral for Watchman evaluation if does not tolerate Eliquis  -  Needs to be on statin and SGLT2i but will defer to outpatient - We will sign off but feel free to call us  back if you have questions or concerns  For questions or updates, please contact [facility] Please consult [email or web address] for contact info under   Signed, Joelle VEAR Ren Donley, MD

## 2024-04-02 NOTE — Progress Notes (Signed)
 [facility]                                                                              Pamela [name], is a 89 y.o. female, DOB - [birth year], FMW:[id number] Admit date - 03/28/2024    Outpatient Primary MD for the patient is Pamela Senior, MD  LOS - 5  days  Chief Complaint  Patient presents with   Fall   Impaired Mobility        Brief summary   Patient is 89 year old female with diabetes mellitus type 2, CKD stage IV, anemia of chronic disease, history of A-fib, PE off Eliquis , combined systolic and diastolic CHF presented to ED with worsening lower extremity swelling and inability to walk.  Patient reported that she had a fall which made it difficult for her to walk and she has been mainly sitting in the recliner with her legs down which made her edema worse.  When she tried to ambulate she became profoundly more weaker, has been needing help to go to the bathroom.  She also feels her abdomen is swollen.  Worsening shortness of breath, not on O2 at baseline however in the ED required up to 4 L O2 via [place]. She also had a fall 5 weeks ago and was noted to have leg swelling and redness with possible cellulitis and was placed on Keflex which she has completed the course. Usually on Lasix , was changed to torsemide and feels not working. In ED, felt somewhat improved after getting IV Lasix   Assessment & Plan    Acute on chronic combined systolic and diastolic CHF Acute respiratory failure with hypoxia - 2D echo in 12/2021 had shown EF of 45 to 50%, G2 DD, global hypokinesis.   - Presented with lower extremity edema,  hypoxia, essentially bedridden in the recliner for 5 weeks after a fall.  Although denies overt shortness of breath however was found to have hypoxia, placed on 4 L O2 via [place], elevated BNP and chest x-ray with volume overload - Creatinine trending up to 1.8 today, negative balance of 3.4 L - weight down from 255.5 lbs on admission -> 242 today  - Creatinine  trended up to 2.0 today.  Hold Lasix .   -Cardiology following, Zio patch outpatient - VQ scan negative for PE, lower extremity venous Dopplers negative for DVT - 2D echo showed EF of 60 to 65%, G1 DD - Continue to hold Lasix , if creatinine improving, cardiology recommended 20 mg daily upon DC.  Acute respiratory failure with hypoxia -History of PE - Continue IV Lasix , wean O2 as tolerated - VQ scan negative for PE, venous Dopplers negative for DVT - EGD completed, GI recommending resuming Eliquis  and reassess, H&H tomorrow  Acute gout - Knee x-ray showed severe tricompartmental osteoarthritis, ankle x-ray negative.  - Uric acid 9.2, CRP 6.1, ESR 42 - Recommended to follow-up with Ortho (she follows with [facility] and has received ESI injections in the knees in the past) - Feels a lot better, continue colchicine and prednisone .  Started on allopurinol 100 mg daily  Acute GI bleed - noted to have multiple rectal bleeding on 9/28, GI consulted.   -  Went EGD which showed normal esophagus, gastritis, nonbleeding gastric ulcer with no stigmata of bleeding, clips placed.  Nonbleeding duodenal diverticulum along the duodenal sweep. - Continue PPI, recommended to start back on eliquis  to see how things go over the next 24 hours.  Follow CBC in a.m.  Paroxysmal atrial fibrillation - Diagnosed in 2023,CHADS2VASC score is 7  -Has been on the Lopressor  in the past, but patient stopped it, currently NSR - Resume Eliquis  per GI recommendations  Generalized debility, venous stasis dermatitis - Patient has completed course of Keflex, appears to have venous stasis dermatitis, hence hold off antibiotics.  If worsening redness, fevers chills, leukocytosis then will consider antibiotics. - Lower extremity edema, redness improved   AKI on CKD stage IIIb - Presented with creatinine of 1.6, trended up to 2.0 - Hold Lasix    Elevated troponin, hyperlipidemia -  likely demand ischemia, no acute chest  pain - Cardiology following.  PAD, coronary artery calcifications on chest CT - Blood panel showed cholesterol 106, LDL 44 - Vascular surgery was consulted by cardiology, Dr. Gretta and felt chronic aortic atherosclerotic changes with no further imaging needed, and do not preclude anticoagulation.    Nutrition Problem: Inadequate oral intake Etiology: acute illness  Interventions: Ensure Enlive (each supplement provides 350kcal and 20 grams of protein), MVI, Refer to RD note for recommendations  Obesity class III Estimated body mass index is 44.35 kg/m as calculated from the following:   Height as of this encounter: 5' 2 (1.575 m).   Weight as of this encounter: 110 kg.  Code Status: DNR DVT Prophylaxis:     Level of Care: Level of care: Progressive Family Communication: Updated patient's husband at the bedside today Disposition Plan:      Remains inpatient appropriate:      Procedures:  2D echo VQ scan  Consultants:   Cardiology  Antimicrobials:   Anti-infectives (From admission, onward)    None          Medications  (feeding supplement) PROSource Plus  30 mL Oral Daily   allopurinol  100 mg Oral Daily   colchicine  0.6 mg Oral Daily   feeding supplement  237 mL Oral BID BM   insulin  aspart  0-5 Units Subcutaneous QHS   insulin  aspart  0-9 Units Subcutaneous TID WC   metoprolol  succinate  25 mg Oral Daily   multivitamin with minerals  1 tablet Oral Daily   pantoprazole  (PROTONIX ) IV  40 mg Intravenous Q12H   pravastatin   20 mg Oral q1800   predniSONE   40 mg Oral QAC breakfast   sodium chloride  flush  3 mL Intravenous Q12H      Subjective:   Pamela [name] was seen and examined today.  Feeling a lot better today.  No acute chest pain or shortness of breath, nausea vomiting or abdominal pain.  Husband at the bedside.   Objective:   Vitals:   04/02/24 0500 04/02/24 0514 04/02/24 0900 04/02/24 0941  BP:  (!) 142/80    Pulse:  89    Resp:  17     Temp:  98.4 F (36.9 C)    TempSrc:      SpO2:  98% (!) 88% 93%  Weight: 110 kg     Height:        Intake/Output Summary (Last 24 hours) at 04/02/2024 1333 Last data filed at 04/02/2024 0800 Gross per 24 hour  Intake 900 ml  Output 1450 ml  Net -550 ml  Wt Readings from Last 3 Encounters:  04/02/24 110 kg  02/23/23 117.8 kg  02/16/22 117.8 kg   Physical Exam General: Alert and oriented x 3, NAD Cardiovascular: S1 S2 clear, RRR.  Respiratory: CTAB, no wheezing Gastrointestinal: Soft, nontender, nondistended, NBS Ext: no pedal edema bilaterally Neuro: no new deficits Psych: Normal affect    Data Reviewed:  I have personally reviewed following labs    CBC Lab Results  Component Value Date   WBC 14.2 (H) 04/02/2024   RBC 3.13 (L) 04/02/2024   HGB 9.7 (L) 04/02/2024   HCT 30.2 (L) 04/02/2024   MCV 96.5 04/02/2024   MCH 31.0 04/02/2024   PLT 226 04/02/2024   MCHC 32.1 04/02/2024   RDW 13.9 04/02/2024   LYMPHSABS 1.1 03/28/2024   MONOABS 0.7 03/28/2024   EOSABS 0.1 03/28/2024   BASOSABS 0.1 03/28/2024     Last metabolic panel Lab Results  Component Value Date   NA 139 04/02/2024   K 4.0 04/02/2024   CL 99 04/02/2024   CO2 29 04/02/2024   BUN 85 (H) 04/02/2024   CREATININE 2.02 (H) 04/02/2024   GLUCOSE 97 04/02/2024   GFRNONAA 23 (L) 04/02/2024   GFRAA 23 (L) 10/11/2019   CALCIUM  8.8 (L) 04/02/2024   PHOS 4.3 04/02/2024   PROT 6.9 03/29/2024   ALBUMIN  3.0 (L) 04/02/2024   BILITOT 0.5 03/29/2024   ALKPHOS 79 03/29/2024   AST 19 03/29/2024   ALT 10 03/29/2024   ANIONGAP 12 04/02/2024    CBG (last 3)  Recent Labs    04/02/24 0358 04/02/24 0750 04/02/24 1145  GLUCAP 100* 77 143*      Coagulation Profile: No results for input(s): INR, PROTIME in the last 168 hours.   Radiology Studies: I have personally reviewed the imaging studies  No results found.      Nydia Distance M.D. [facility] 04/02/2024, 1:33 PM  Available  via Epic secure chat 7am-7pm After 7 pm, please refer to night coverage provider listed on amion.

## 2024-04-02 NOTE — Progress Notes (Signed)
 Physical Therapy Treatment Patient Details Name: Pamela [name] MRN: [id number] DOB: 1934/08/27 Today's Date: 04/02/2024   History of Present Illness patient is a 89 year old female who presented from home 03/28/24 with inability to stand and pivot. . Patient has had 2 falls and has been nonambulatory x ~ 5 weeks.Patient was admitted  chronic systolic CHF., hypoxia.. During hospitalization, beginning today 9/28, pt has had blood in stool as well diarrhea. Venous Dopplar on 9/28 showed No evidence of deep vein thrombosis seen in the lower extremities, bilaterally.   PMHC: HF, arthritis of L knee, hypertension, T2DM, HFrEF, CKD stage IIIb, hyperlipidemia and pulmonary embolism    PT Comments   Pt admitted with above diagnosis.  Pt currently with functional limitations due to the deficits listed below (see PT Problem List). Pt in bed when PT arrived. Pt states she was very anxious yesterday about EGD and could not do therapy. Pt reports strong fear of falling and being limited to lift chair <> BSC x 5 wks s/p falling. Pt reports that B LE edema is better and she is motivated to participate with therapy. Pt required increased time and effort with use of hospital bed and CGA for supine to sit, slight posterior lean seated EOB, pt required min/mod for sit to stand  from EOB with pt pulling on RW with first standing trial pt unable to power up or obtain extension posture, second and third standing trials pt able to power up, maintain extension posture and standing balance for 10s with B UE support at Rw and min A with encouragement and noted B LE instability, pt required mod A for sit to supine. Pt left in bed, all needs in place and spouse present. Pt will benefit from acute skilled PT to increase their independence and safety with mobility to allow discharge.      If plan is discharge home, recommend the following: Two people to help with walking and/or transfers;A lot of help with  bathing/dressing/bathroom;Assistance with cooking/housework;Assist for transportation;Help with stairs or ramp for entrance   Can travel by private vehicle     No  Equipment Recommendations  None recommended by PT    Recommendations for Other Services       Precautions / Restrictions Precautions Precautions: Fall Precaution/Restrictions Comments: monitor sats Restrictions Weight Bearing Restrictions Per Provider Order: No     Mobility  Bed Mobility Overal bed mobility: Needs Assistance Bed Mobility: Supine to Sit, Sit to Supine     Supine to sit: HOB elevated, Used rails, Contact guard Sit to supine: Mod assist, +2 for physical assistance   General bed mobility comments: pt is able to demonstrate ability for supine to sit with use of hospital bed increased time, effort and encouragement, pt required mod A for B LE to return to supine and total A to scoot to [facility]    Transfers Overall transfer level: Needs assistance Equipment used: Rolling walker (2 wheels) Transfers: Sit to/from Stand Sit to Stand: Mod assist, Min assist           General transfer comment: min/mod A, strong cues for power up and extension posture as well as encouragement wtih pt able to demonstrate abiltiy to perform sit to stand x 3 from EOB and progressed to min A for standing balance and 10s standing tolernace, pt expressed fear of falling and B LEs giving out pt stated she has been unable to ambulate in 6 wks. pt and family ed provided on anticipating B LE weakness  and healthy fear of falling vs debilitating fear    Ambulation/Gait               General Gait Details: NT   Stairs             Wheelchair Mobility     Tilt Bed    Modified Rankin (Stroke Patients Only)       Balance Overall balance assessment: Needs assistance Sitting-balance support: Feet supported, No upper extremity supported Sitting balance-Leahy Scale: Fair Sitting balance - Comments: Must lean  posteriroly due to body habitus   Standing balance support: Bilateral upper extremity supported, During functional activity, Reliant on assistive device for balance Standing balance-Leahy Scale: Poor                              Communication Communication Communication: No apparent difficulties  Cognition Arousal: Alert Behavior During Therapy: Anxious   PT - Cognitive impairments: No apparent impairments                         Following commands: Intact      Cueing Cueing Techniques: Verbal cues  Exercises      General Comments        Pertinent Vitals/Pain Pain Assessment Pain Assessment: 0-10 Pain Score: 4  Pain Location: B LE Pain Descriptors / Indicators: Discomfort, Sore Pain Intervention(s): Limited activity within patient's tolerance, Monitored during session, Repositioned    Home Living                          Prior Function            PT Goals (current goals can now be found in the care plan section) Acute Rehab PT Goals Patient Stated Goal: go home PT Goal Formulation: With patient/family Time For Goal Achievement: 04/12/24 Potential to Achieve Goals: Fair Progress towards PT goals: Progressing toward goals    Frequency    Min 3X/week      PT Plan      Co-evaluation              AM-PAC PT 6 Clicks Mobility   Outcome Measure  Help needed turning from your back to your side while in a flat bed without using bedrails?: A Lot Help needed moving from lying on your back to sitting on the side of a flat bed without using bedrails?: A Lot Help needed moving to and from a bed to a chair (including a wheelchair)?: A Lot Help needed standing up from a chair using your arms (e.g., wheelchair or bedside chair)?: A Lot Help needed to walk in hospital room?: Total Help needed climbing 3-5 steps with a railing? : Total 6 Click Score: 10    End of Session Equipment Utilized During Treatment: Gait  belt;Oxygen  Activity Tolerance: Patient limited by fatigue Patient left: in bed;with call bell/phone within reach;with family/visitor present Nurse Communication: Mobility status;Need for lift equipment (stedy if to get OOB) PT Visit Diagnosis: Unsteadiness on feet (R26.81);Other abnormalities of gait and mobility (R26.89);Repeated falls (R29.6);Muscle weakness (generalized) (M62.81);Pain Pain - Right/Left: Right Pain - part of body: Knee     Time: 8641-8570 PT Time Calculation (min) (ACUTE ONLY): 31 min  Charges:    $Therapeutic Activity: 23-37 mins PT General Charges $$ ACUTE PT VISIT: 1 Visit  [place], PT Acute Rehab    [place] VEAR Drone 04/02/2024, 3:38 PM

## 2024-04-02 NOTE — Progress Notes (Addendum)
 Progress Note   Subjective  Chief Complaint: Hematochezia  Today, patient vomiting in bed, she is back on her oxygen , but in general feels well.  She has not had a bowel movement since prior to time of her procedure yesterday.  She thinks this is because she was not allowed to eat anything.  Back on a carb modified diet today and was able to eat breakfast.  No new abdominal pain.   Objective   Vital signs in last 24 hours: Temp:  [94 F (34.4 C)-98.5 F (36.9 C)] 98.4 F (36.9 C) (09/30 0514) Pulse Rate:  [63-97] 89 (09/30 0514) Resp:  [17-22] 17 (09/30 0514) BP: (122-160)/(53-80) 142/80 (09/30 0514) SpO2:  [88 %-98 %] 93 % (09/30 0941) Weight:  [110 kg] 110 kg (09/30 0500) Last BM Date : 04/01/24 General:   Elderly obese white female in NAD Heart:  Regular rate and rhythm; no murmurs Lungs: Respirations even and unlabored, lungs CTA bilaterally Abdomen:  Soft, nontender and nondistended. Normal bowel sounds. Psych:  Cooperative. Normal mood and affect.  Intake/Output from previous day: 09/29 0701 - 09/30 0700 In: 640 [P.O.:340; I.V.:300] Out: 1450 [Urine:1450] Intake/Output this shift: Total I/O In: 360 [P.O.:360] Out: -   Lab Results: Recent Labs    03/31/24 0500 03/31/24 1217 04/01/24 0007 04/01/24 0510 04/02/24 0602  WBC 11.9*  --  17.3*  --  14.2*  HGB 13.1   < > 10.0* 9.6* 9.7*  HCT 42.8   < > 30.7* 29.7* 30.2*  PLT 207  --  181  --  226   < > = values in this interval not displayed.   BMET Recent Labs    03/31/24 0500 04/01/24 0007 04/02/24 0602  NA 137 137 139  K 3.9 4.4 4.0  CL 95* 97* 99  CO2 31 29 29   GLUCOSE 102* 139* 97  BUN 43* 70* 85*  CREATININE 1.68* 1.86* 2.02*  CALCIUM  9.0 8.6* 8.8*   LFT Recent Labs    04/02/24 0602  ALBUMIN  3.0*   Studies/Results: VAS US  LOWER EXTREMITY VENOUS (DVT) Result Date: 03/31/2024  Lower Venous DVT Study Patient Name:  NALIA HONEYCUTT  Date of Exam:   03/31/2024 Medical Rec #: [id number]        Accession #:    [id number] Date of Birth: 1934-08-09        Patient Gender: F Patient Age:   75 years Exam Location:  [facility] Procedure:      VAS US  LOWER EXTREMITY VENOUS (DVT) Referring Phys: BLEASE DOUTOVA --------------------------------------------------------------------------------  Indications: Swelling, Pain, and pulmonary embolism.  Risk Factors: Confirmed PE obesity. Anticoagulation: Eliquis . Comparison Study: 02/16/22 -Negative left Lower ext                   01/01/22 - Negative right lwer ext Performing Technologist: Ricka Sturdivant-Jones RDMS, RVT  Examination Guidelines: A complete evaluation includes B-mode imaging, spectral Doppler, color Doppler, and power Doppler as needed of all accessible portions of each vessel. Bilateral testing is considered an integral part of a complete examination. Limited examinations for reoccurring indications may be performed as noted. The reflux portion of the exam is performed with the patient in reverse Trendelenburg.  +---------+---------------+---------+-----------+----------+--------------+ RIGHT    CompressibilityPhasicitySpontaneityPropertiesThrombus Aging +---------+---------------+---------+-----------+----------+--------------+ CFV      Full           Yes      Yes                                 +---------+---------------+---------+-----------+----------+--------------+  SFJ      Full                                                        +---------+---------------+---------+-----------+----------+--------------+ FV Prox  Full                                                        +---------+---------------+---------+-----------+----------+--------------+ FV Mid   Full           Yes      Yes                                 +---------+---------------+---------+-----------+----------+--------------+ FV DistalFull                                                         +---------+---------------+---------+-----------+----------+--------------+ PFV      Full                                                        +---------+---------------+---------+-----------+----------+--------------+ POP      Full           Yes      Yes                                 +---------+---------------+---------+-----------+----------+--------------+ PTV      Full                                                        +---------+---------------+---------+-----------+----------+--------------+ PERO     Full                                                        +---------+---------------+---------+-----------+----------+--------------+   +---------+---------------+---------+-----------+----------+--------------+ LEFT     CompressibilityPhasicitySpontaneityPropertiesThrombus Aging +---------+---------------+---------+-----------+----------+--------------+ CFV      Full           Yes      Yes                                 +---------+---------------+---------+-----------+----------+--------------+ SFJ      Full                                                        +---------+---------------+---------+-----------+----------+--------------+  FV Prox  Full                                                        +---------+---------------+---------+-----------+----------+--------------+ FV Mid   Full           Yes      Yes                                 +---------+---------------+---------+-----------+----------+--------------+ FV DistalFull                                                        +---------+---------------+---------+-----------+----------+--------------+ PFV      Full                                                        +---------+---------------+---------+-----------+----------+--------------+ POP      Full           Yes      Yes                                  +---------+---------------+---------+-----------+----------+--------------+ PTV      Full                                                        +---------+---------------+---------+-----------+----------+--------------+ PERO     Full                                                        +---------+---------------+---------+-----------+----------+--------------+     Summary: BILATERAL: - No evidence of deep vein thrombosis seen in the lower extremities, bilaterally. -No evidence of popliteal cyst, bilaterally.   *See table(s) above for measurements and observations. Electronically signed by Lonni Gaskins MD on 03/31/2024 at 2:39:33 PM.    Final    EGD 04/01/2024: Findings:      The examined esophagus was normal.      Localized mild inflammation characterized by erythema was found in the       gastric antrum and in the prepyloric region of the stomach.      One non-bleeding cratered gastric ulcer with no stigmata of bleeding was       found in the gastric antrum. The lesion was 6 mm in largest dimension.       For hemostasis, two hemostatic clips were successfully placed.      Biopsies were taken with a cold forceps in the gastric body and in the       gastric antrum for Helicobacter pylori testing.  The duodenal bulb was normal.      A medium non-bleeding diverticulum was found along the duodenal sweep      The second portion of the duodenum was normal. Impression:               - Normal esophagus.                           - Gastritis.                           - Non-bleeding gastric ulcer with no stigmata of                            bleeding. Clips were placed.                           - Normal duodenal bulb.                           - Non-bleeding duodenal diverticulum along the                            duodenal sweep                           - Normal second portion of the duodenum.                           - Biopsies were taken with a cold forceps for                             Helicobacter pylori testing.                           - Differential diagnosis for the patient's recent                            bleeding could include bleeding from gastric ulcer                            versus lower GI bleeding from diverticula,                            angioectasias in the lower GI tract. Gastric ulcer                            was not bleeding at the time of EGD and did not                            have stigmata of recent bleeding but could have                            potentially been a culprit. Moderate Sedation:      Not Applicable - Patient had care per Anesthesia. Recommendation:           -  Return patient to hospital ward for ongoing care.                           - Recommend changing pantoprazole  to 40 mg IV twice                            daily in the setting of peptic ulcer with potential                            for recent bleeding.                           - Continue holding anticoagulation and will                            reassess patient's stability tomorrow to determine                            timing of reintroduction                           - Continue to monitoring serial hemoglobin                            hematocrit, BUN/Cr ratio.                           - Follow-up pathology results evaluating for H.                            pylori and treat for H. pylori if positive.                           - Limit exposure to NSAIDs in the setting of peptic                            ulcer                           - Patient will need repeat EGD in 8 to 12 weeks                            status post hospitalization for confirmation of                            peptic ulcer healing.   Assessment / Plan:   Mrs. Lamarr is an 89 year old female with a past medical history as listed below including diabetes type 2, CKD stage IV, anemia of chronic disease, history of A-fib, PE currently off Eliquis , combined systolic  and diastolic CHF, who presented to the ED with worsening lower extremity swelling and inability to walk.  We were consulted in regards to GI bleed and anemia.  Assessment: 1.  Hematochezia: Initially thought diverticular based on overall clinical scenario, patient declined nuc med bleeding scan due to lengthy exam and could not undergo CTA given GFR, continued bleeding  a total of 10 times on 9/28 a mixture of bright red blood maroon-colored blood, hemoglobin dramatically dropped from 13.1--> 9.6, EGD pursued yesterday with a nonbleeding gastric ulcer clipped and treated, hemoglobin stable overnight currently 9.7, patient's Eliquis  is currently on hold with her last dose 03/30/2024; presumed upper GI bleed with findings of gastric ulcer on recent EGD, hemoglobin stable 2.  A-fib: Typically on Eliquis , last dose 03/30/2024 9:30 AM 3.  CKD 4.  Acute on chronic combined systolic and diastolic CHF with acute respiratory failure and hypoxia: 2D echo 6/23 with an EF of 45-50%, still on off-and-on oxygen  5.  Acute gout: Knee x-ray with severe tricompartmental osteoarthritis placed on colchicine and prednisone  6.  Generalized debility/venous stasis dermatitis  Plan: 1.  Hemoglobin has remained stable overnight.  Discussed with Dr. Suzann.  Genna to start back on Eliquis  to see how things go over the next 24 hours. 2.  Continue current diet 3.  Continue Pantoprazole  40 mg IV twice daily while here converted to p.o. when discharged 4.  Continue to monitor hemoglobin and transfusion as needed less than 7 5.  Patient will need a repeat EGD in 8-12 weeks to ensure healing, discussed this with her today.  Pending oxygen  requirements this may or may not be needed need to take place in the outpatient hospital setting. 6.  Pathology still pending from yesterday, will follow-up with results 7.  If patient has further signs of bleeding would need to consider colonoscopy  Thank you for your kind consultation, we will  continue to follow.    LOS: 5 days   Delon Hendricks Failing  04/02/2024, 10:03 AM

## 2024-04-02 NOTE — Progress Notes (Signed)
 PHARMACY - ANTICOAGULATION CONSULT NOTE  Pharmacy Consult for apixaban  Indication: history of bilateral PE w/ RV strain and pAF  Allergies  Allergen Reactions   Levaquin [Levofloxacin In D5w] Nausea And Vomiting   Levofloxacin Nausea And Vomiting and Other (See Comments)    Abdominal pain, also   Ensure Diarrhea   Lisinopril Cough   Oxycodone  Nausea Only    Patient Measurements: Height: 5' 2 (157.5 cm) Weight: 110 kg (242 lb 8.1 oz) IBW/kg (Calculated) : 50.1 HEPARIN  DW (KG): 78.6  Vital Signs: Temp: 98.4 F (36.9 C) (09/30 1342) Temp Source: Oral (09/30 1342) BP: 127/81 (09/30 1342) Pulse Rate: 82 (09/30 1342)  Labs: Recent Labs    03/31/24 0500 03/31/24 1217 04/01/24 0007 04/01/24 0510 04/02/24 0602  HGB 13.1   < > 10.0* 9.6* 9.7*  HCT 42.8   < > 30.7* 29.7* 30.2*  PLT 207  --  181  --  226  CREATININE 1.68*  --  1.86*  --  2.02*   < > = values in this interval not displayed.    Estimated Creatinine Clearance: 23 mL/min (A) (by C-G formula based on SCr of 2.02 mg/dL (H)).   Medical History: Past Medical History:  Diagnosis Date   Arthritis    Bilateral lower extremity edema    left lower extremity cellulitis   Chronic heart failure with mildly reduced ejection fraction (HFmrEF, 41-49%) (HCC)    Common bile duct stone 06/22/2017   Diabetes (HCC)    Diverticulosis    Duodenal diverticulum, periampullary 11/03/2018   Aborted ERCP 2016   GERD (gastroesophageal reflux disease)    History of hiatal hernia    Hypertension    Kidney disease, chronic, stage III (GFR 30-59 ml/min) (HCC)    Obesity    Osteopenia    PAF (paroxysmal atrial fibrillation) (HCC)    Seizures (HCC) 22 yrs ago   no cause found   Tubular adenoma of colon 2009    Medications:  Prior to admission apixaban  was continued on 9/27 then held on 9/28 due to GIB, last dose 9/28.    Assessment: Patient underwent EGD yesterday and was found to have nonbleeding gastric ulcer that was  treated. Per Cards and GI, ok to restart apixaban  today and Pharmacy consulted for apixaban  dosing.  Age 89, wt 110kg, and SCr 2.02.  No dosage adjustment necessary for any degree of kidney impairment for treatment of PE.  Goal of Therapy:  Treat PE and reduce risk of stroke Monitor platelets by anticoagulation protocol: Yes   Plan:  Apixaban  5 mg po BID Monitor CBC and signs/symptoms of bleeding   Thank you for allowing pharmacy to be a part of this patient's care.  Eleanor EMERSON Agent, PharmD, BCPS Clinical Pharmacist [place] 04/02/2024 2:27 PM

## 2024-04-03 DIAGNOSIS — R600 Localized edema: Secondary | ICD-10-CM | POA: Diagnosis not present

## 2024-04-03 LAB — CBC
HCT: 28.5 % — ABNORMAL LOW (ref 36.0–46.0)
Hemoglobin: 9.1 g/dL — ABNORMAL LOW (ref 12.0–15.0)
MCH: 30.7 pg (ref 26.0–34.0)
MCHC: 31.9 g/dL (ref 30.0–36.0)
MCV: 96.3 fL (ref 80.0–100.0)
Platelets: 242 K/uL (ref 150–400)
RBC: 2.96 MIL/uL — ABNORMAL LOW (ref 3.87–5.11)
RDW: 13.8 % (ref 11.5–15.5)
WBC: 12.5 K/uL — ABNORMAL HIGH (ref 4.0–10.5)
nRBC: 0 % (ref 0.0–0.2)

## 2024-04-03 LAB — BASIC METABOLIC PANEL WITH GFR
Anion gap: 13 (ref 5–15)
BUN: 89 mg/dL — ABNORMAL HIGH (ref 8–23)
CO2: 27 mmol/L (ref 22–32)
Calcium: 8.6 mg/dL — ABNORMAL LOW (ref 8.9–10.3)
Chloride: 101 mmol/L (ref 98–111)
Creatinine, Ser: 1.9 mg/dL — ABNORMAL HIGH (ref 0.44–1.00)
GFR, Estimated: 25 mL/min — ABNORMAL LOW
Glucose, Bld: 102 mg/dL — ABNORMAL HIGH (ref 70–99)
Potassium: 4.3 mmol/L (ref 3.5–5.1)
Sodium: 140 mmol/L (ref 135–145)

## 2024-04-03 LAB — GLUCOSE, CAPILLARY
Glucose-Capillary: 100 mg/dL — ABNORMAL HIGH (ref 70–99)
Glucose-Capillary: 145 mg/dL — ABNORMAL HIGH (ref 70–99)
Glucose-Capillary: 173 mg/dL — ABNORMAL HIGH (ref 70–99)
Glucose-Capillary: 152 mg/dL — ABNORMAL HIGH (ref 70–99)

## 2024-04-03 MED ORDER — APIXABAN 2.5 MG PO TABS
2.5000 mg | ORAL_TABLET | Freq: Two times a day (BID) | ORAL | Status: DC
Start: 2024-04-03 — End: 2024-04-04
  Administered 2024-04-03 – 2024-04-04 (×2): 2.5 mg via ORAL
  Filled 2024-04-03 (×2): qty 1

## 2024-04-03 NOTE — TOC Progression Note (Addendum)
 Transition of Care [facility]) - Progression Note    Patient Details  Name: Pamela [name] MRN: [id number] Date of Birth: 01-31-1935  Transition of [facility]) CM/SW Contact  Gill Delrossi, Nathanel, RN Phone Number: 04/03/2024, 1:58 PM  Clinical Narrative: Declined ST SNF.Spoke to patient/Walter(spouse) both agree to HHC-HHRN(disease mgmnt)/PT/OT-order in await face to face order-rep [facility] aware,& accepted.No 02 needed. Has own transport home. MD updated.    Expected Discharge Plan: Home w Home Health Services Barriers to Discharge: Continued Medical Work up               Expected Discharge Plan and Services In-house Referral: Clinical Social Work   Post Acute Care Choice: Home Health Living arrangements for the past 2 months: Single Family Home                 DME Arranged: N/A DME Agency: NA       HH Arranged: RN, Disease Management, PT, OT HH Agency: [facility] Date HH Agency Contacted: 04/03/24 Time HH Agency Contacted: 1357 Representative spoke with at [facility] Agency: [facility]   Social Drivers of Health (SDOH) Interventions SDOH Screenings   Food Insecurity: No Food Insecurity (03/29/2024)  Housing: Low Risk  (03/29/2024)  Transportation Needs: No Transportation Needs (03/29/2024)  Utilities: Not At Risk (03/29/2024)  Social Connections: Unknown (03/29/2024)  Recent Concern: Social Connections - Moderately Isolated (03/29/2024)  Tobacco Use: Medium Risk (03/29/2024)    Readmission Risk Interventions     No data to display

## 2024-04-03 NOTE — Progress Notes (Signed)
 Progress Note   Subjective  Chief Complaint: Hematochezia  Today, patient reports a fairly large BM this morning that appeared darker in color, she thinks still maybe a little bit of blood as we discussed may happen.  She was encouraged that she was able to get up to the bedside commode and have that bowel movement rather than have to be cleaned up in the bed.  In general feeling better every day.  Per [facility] patient has received 3 doses of her Eliquis  since this was restarted yesterday.   Objective   Vital signs in last 24 hours: Temp:  [97.8 F (36.6 C)-98.4 F (36.9 C)] 97.8 F (36.6 C) (10/01 0528) Pulse Rate:  [78-83] 78 (10/01 0528) Resp:  [15-19] 15 (10/01 0528) BP: (123-127)/(62-81) 126/62 (10/01 0528) SpO2:  [93 %-98 %] 98 % (10/01 0528) Last BM Date : 04/01/24 General:    Elderly white female in NAD Heart:  Regular rate and rhythm; no murmurs Lungs: Respirations even and unlabored, lungs CTA bilaterally +on O2 via [place] Abdomen:  Soft, nontender and nondistended. Normal bowel sounds. Psych:  Cooperative. Normal mood and affect.  Intake/Output from previous day: 09/30 0701 - 10/01 0700 In: 960 [P.O.:960] Out: 1000 [Urine:1000]   Lab Results: Recent Labs    04/01/24 0007 04/01/24 0510 04/02/24 0602 04/03/24 0502  WBC 17.3*  --  14.2* 12.5*  HGB 10.0* 9.6* 9.7* 9.1*  HCT 30.7* 29.7* 30.2* 28.5*  PLT 181  --  226 242   BMET Recent Labs    04/01/24 0007 04/02/24 0602  NA 137 139  K 4.4 4.0  CL 97* 99  CO2 29 29  GLUCOSE 139* 97  BUN 70* 85*  CREATININE 1.86* 2.02*  CALCIUM  8.6* 8.8*   LFT Recent Labs    04/02/24 0602  ALBUMIN  3.0*    Assessment / Plan:   Mrs. Lamarr is an 89 year old female with a past medical history as listed below including diabetes type 2, CKD stage IV, anemia of chronic disease, history of A-fib, PE currently off Eliquis , combined systolic and diastolic CHF, who presented to the ED with worsening lower extremity swelling and  inability to walk.  We were consulted in regards to GI bleed and anemia.   Assessment: 1.  Hematochezia: Initially thought diverticular based on overall clinical scenario, patient declined nuc med bleeding scan due to lengthy exam and could not undergo CTA given GFR, continued bleeding a total of 10 times on 9/28 a mixture of bright red blood maroon-colored blood, hemoglobin dramatically dropped from 13.1--> 9.6, EGD pursued yesterday with a nonbleeding gastric ulcer clipped and treated, hemoglobin stable overnight currently 9.7-->9.1, patient's Eliquis  was restarted on 9/33/25, has had 3 doses so far with 1 further dark bowel movement this morning; presumed upper GI bleed with findings of gastric ulcer on recent EGD, hemoglobin stable overnight 2.  A-fib: Typically on Eliquis , restarted 04/02/2024 3.  CKD 4.  Acute on chronic combined systolic and diastolic CHF with acute respiratory failure and hypoxia: 2D echo 6/23 with an EF of 45-50%, still on off-and-on oxygen  5.  Acute gout: Knee x-ray with severe tricompartmental osteoarthritis placed on colchicine and prednisone  6.  Generalized debility/venous stasis dermatitis   Plan: 1.  Continue to monitor hemoglobin and transfuse as needed less than 7. 2.  Continue Pantoprazole  40 mg IV twice daily while here and converted to p.o. and discharge. 3.  Again patient will need a repeat EGD in 8 to 12 weeks to ensure healing.  Will get her a follow-up appointment in clinic in 4 to 6 weeks to discuss and schedule. 4.  Patient appears stable.  Please let us  know if any further signs of GI bleed.  Thank you for your kind consultation, we will sign off.   LOS: 6 days   Delon Hendricks Failing  04/03/2024, 9:25 AM

## 2024-04-03 NOTE — Progress Notes (Signed)
 Mobility Specialist - Progress Note   04/03/24 0806  Mobility  Activity Pivoted/transferred from bed to chair  Level of Assistance Moderate assist, patient does 50-74%  Assistive Device Front wheel walker  Range of Motion/Exercises Active  Activity Response Tolerated well  Mobility Referral Yes  Mobility visit 1 Mobility  Mobility Specialist Start Time (ACUTE ONLY) 0756  Mobility Specialist Stop Time (ACUTE ONLY) 0806  Mobility Specialist Time Calculation (min) (ACUTE ONLY) 10 min   Pt was found in bed and agreeable to mobilize. Cues for pursed lip breathing and at EOS was left on recliner chair with all needs met. Call bell in reach and husband in room. RN and NT notified.   Erminio Leos,  Mobility Specialist Can be reached via Secure Chat

## 2024-04-03 NOTE — Progress Notes (Signed)
 PHARMACY - ANTICOAGULATION CONSULT NOTE  Pharmacy Consult for apixaban  Indication: history of bilateral PE w/ RV strain and pAF  Allergies  Allergen Reactions   Levaquin [Levofloxacin In D5w] Nausea And Vomiting   Levofloxacin Nausea And Vomiting and Other (See Comments)    Abdominal pain, also   Ensure Diarrhea   Lisinopril Cough   Oxycodone  Nausea Only    Patient Measurements: Height: 5' 2 (157.5 cm) Weight: 110 kg (242 lb 8.1 oz) IBW/kg (Calculated) : 50.1 HEPARIN  DW (KG): 78.6  Vital Signs: Temp: 97.8 F (36.6 C) (10/01 0528) Temp Source: Oral (10/01 0528) BP: 126/62 (10/01 0528) Pulse Rate: 78 (10/01 0528)  Labs: Recent Labs    04/01/24 0007 04/01/24 0510 04/02/24 0602 04/03/24 0502  HGB 10.0* 9.6* 9.7* 9.1*  HCT 30.7* 29.7* 30.2* 28.5*  PLT 181  --  226 242  CREATININE 1.86*  --  2.02* 1.90*    Estimated Creatinine Clearance: 24.4 mL/min (A) (by C-G formula based on SCr of 1.9 mg/dL (H)).   Medical History: Past Medical History:  Diagnosis Date   Arthritis    Bilateral lower extremity edema    left lower extremity cellulitis   Chronic heart failure with mildly reduced ejection fraction (HFmrEF, 41-49%) (HCC)    Common bile duct stone 06/22/2017   Diabetes (HCC)    Diverticulosis    Duodenal diverticulum, periampullary 11/03/2018   Aborted ERCP 2016   GERD (gastroesophageal reflux disease)    History of hiatal hernia    Hypertension    Kidney disease, chronic, stage III (GFR 30-59 ml/min) (HCC)    Obesity    Osteopenia    PAF (paroxysmal atrial fibrillation) (HCC)    Seizures (HCC) 22 yrs ago   no cause found   Tubular adenoma of colon 2009    Medications:  Prior to admission apixaban  5mg  PO BID (no recent fill history, was not taking) Apixaban  was resumed on 9/27, but held on 9/28 due to GIB. Apixaban  was resumed on 9/30.     Assessment: Pharmacy is consulted to dose apixaban  for Afib indication.  Patient has a history of VTE, but  currently none. 9/26 VQ scan: negative for PE 9/28 Dopplers: negative for DVT bilaterally  Will begin reduced dose apixaban  for afib d/t Age > 80, and SCr > 1.5, and high risk for GIB    Goal of Therapy:  Treat PE and reduce risk of stroke Monitor platelets by anticoagulation protocol: Yes   Plan:  Apixaban  reduced to 2.5 mg po BID Monitor CBC prn and signs/symptoms of bleeding   Thank you for allowing pharmacy to be a part of this patient's care.  Wanda Hasting PharmD, BCPS [facility] main pharmacy [phone] 04/03/2024 12:26 PM

## 2024-04-03 NOTE — Progress Notes (Signed)
 Patient weaned to RA, initially 96%. Unable to ambulate more than a couple of steps but maintained at 94% on RA while getting back and forth to [facility].

## 2024-04-03 NOTE — Plan of Care (Signed)
  Problem: Fluid Volume: Goal: Ability to maintain a balanced intake and output will improve Outcome: Progressing   Problem: Metabolic: Goal: Ability to maintain appropriate glucose levels will improve Outcome: Progressing   Problem: Skin Integrity: Goal: Risk for impaired skin integrity will decrease Outcome: Progressing   Problem: Tissue Perfusion: Goal: Adequacy of tissue perfusion will improve Outcome: Progressing   Problem: Clinical Measurements: Goal: Ability to maintain clinical measurements within normal limits will improve Outcome: Progressing Goal: Diagnostic test results will improve Outcome: Progressing

## 2024-04-03 NOTE — Progress Notes (Signed)
 PROGRESS NOTE    Pamela [name]  FMW:[id number] DOB: September 10, 1934 DOA: 03/28/2024 PCP: [name], MD   Brief Narrative: 89 year old with past medical history significant for diabetes type 2, CKD stage IV, anemia of chronic disease, history of A-fib, PE off of Eliquis , combined systolic and diastolic heart failure presented to the ED with worsening lower extremity swelling and inability to ambulate.  Patient had had a fall which made it difficult for her to walk and she has been mainly sitting in the recliner with her legs down which made her edema worse.  She reports abdominal swelling she required up to 4 L of oxygen  in the ED, does not use home oxygen .  She was started on antibiotics after a fall 5 weeks ago and treated for lower extremity cellulitis.  She was transition to torsemide as an outpatient and feel it is not working.     Assessment & Plan:   Principal Problem:   CHF exacerbation (HCC) Active Problems:   Primary hypertension   Mixed hyperlipidemia   CKD (chronic kidney disease) stage 3, GFR 30-59 ml/min (HCC)   Type 2 diabetes mellitus, without long-term current use of insulin  (HCC)   Acute on chronic respiratory failure with hypoxia (HCC)   Acute on chronic heart failure with mildly reduced ejection fraction (HFmrEF, 41-49%) (HCC)   PAF (paroxysmal atrial fibrillation) (HCC)   Prolonged QT interval   History of pulmonary embolism   Debility   Venous stasis dermatitis   Elevated troponin   Coronary artery calcification seen on CAT scan   Pure hypercholesterolemia   PAD (peripheral artery disease)   AKI (acute kidney injury)   Acute gastrointestinal bleeding   ABLA (acute blood loss anemia)   Gastric ulcer  1-Acute on chronic combined systolic and diastolic heart failure Acute hypoxic respiratory failure: - 2D echo 2023 ejection fraction 45 to 50% grade 2 diastolic dysfunction - Presented with lower extremity edema, hypoxia, shortness of breath requiring 4 L of  oxygen .  Elevated BNP chest x-ray with volume overload. - She was diuresed with IV Lasix  which was subsequently stopped due to increased creatinine - -3.4 L. - Recheck cardiology evaluation, needs Zio patch as an outpatient - Dopplers and VQ scan negative for PE DVT -2D echo repeated ejection fraction 65%, diastolic dysfunction grade 1 -Plan to resume oral Lasix  tomorrow.  Creatinine down to 1.9 today.  Baseline around 1.4---1.6   Acute respiratory failure with hypoxia History of PE: - Setting of heart failure exacerbation. - Treated with IV Lasix . - VQ scan and Doppler negative for PE and DVT GI recommended resumption of Eliquis .  Acute gout - Knee x-ray showed severe arthritic compartmental osteoarthritis Uric acid 9.2, CRP 6.1 To follow-up with Ortho as an outpatient. Improved on colchicine and prednisone .  Restarted allopurinol  Acute GI bleed: -Underwent  EGD which showed normal esophagus, gastritis, nonbleeding gastric ulcer with no stigmata of bleeding, clips placed.  Nonbleeding duodenal diverticulum along the duodenal sweep.  - H had a bowel movement today Maroon color per nurse.  Plan to monitor overnight  Paroxysmal A-fib - Patient to resume Eliquis  per GI monitor hemoglobin - [place] score 7  Generalized debility venous stasis dermatitis - Worsening in the setting of volume overload. - Improve  AKI on CKD stage IIIb - Presented with a creatinine of 1.6 increased to 2.0.  In the setting of acute diuresis.  Lasix  has been on hold  Elevated troponin, hyperlipidemia: - Likely demand ischemia, no acute chest pain.  PAD, CAD calcification on CT chest -LDL 44. Vascular surgery was consulted by cardiology, Dr. Gretta felt chronic aortic atherosclerosis changes with not further imaging needed, and did not preclude anticoagulation.  Obesity class III - Needs lifestyle modification     Nutrition Problem: Inadequate oral intake Etiology: acute  illness    Signs/Symptoms: per patient/family report    Interventions: Ensure Enlive (each supplement provides 350kcal and 20 grams of protein), MVI, Refer to RD note for recommendations  Estimated body mass index is 44.35 kg/m as calculated from the following:   Height as of this encounter: 5' 2 (1.575 m).   Weight as of this encounter: 110 kg.   DVT prophylaxis: Eliquis  Code Status: DNR limited Family Communication: Husband at bedside Disposition Plan:  Status is: Inpatient Remains inpatient appropriate because: Management of anemia and volume overload    Consultants:  Cardiology GI  Procedures:  Endoscopy  Antimicrobials:    Subjective: Alert and conversant feels better, she has been able to get into the bedside commode.  Had a bowel movement that was maroon   Objective: Vitals:   04/02/24 0941 04/02/24 1342 04/02/24 1938 04/03/24 0528  BP:  127/81 123/72 126/62  Pulse:  82 83 78  Resp:   19 15  Temp:  98.4 F (36.9 C) 98.2 F (36.8 C) 97.8 F (36.6 C)  TempSrc:  Oral Oral Oral  SpO2: 93% 94% 96% 98%  Weight:      Height:        Intake/Output Summary (Last 24 hours) at 04/03/2024 0743 Last data filed at 04/03/2024 0130 Gross per 24 hour  Intake 960 ml  Output 1000 ml  Net -40 ml   Filed Weights   04/01/24 0356 04/01/24 1000 04/02/24 0500  Weight: 111.4 kg 109.7 kg 110 kg    Examination:  General exam: Appears calm and comfortable  Respiratory system: Clear to auscultation. Respiratory effort normal. Cardiovascular system: S1 & S2 heard, IRR.  Systolic murmur Gastrointestinal system: Abdomen is nondistended, soft and nontender. No organomegaly or masses felt.  Central nervous system: Alert and oriented. No focal neurological deficits. Extremities: Symmetric 5 x 5 power.     Data Reviewed: I have personally reviewed following labs and imaging studies  CBC: Recent Labs  Lab 03/28/24 1602 03/29/24 0111 03/30/24 0529 03/31/24 0500  03/31/24 1217 03/31/24 1653 04/01/24 0007 04/01/24 0510 04/02/24 0602 04/03/24 0502  WBC 9.7   < > 11.1* 11.9*  --   --  17.3*  --  14.2* 12.5*  NEUTROABS 7.8*  --   --   --   --   --   --   --   --   --   HGB 13.6   < > 13.5 13.1   < > 11.0* 10.0* 9.6* 9.7* 9.1*  HCT 43.9   < > 43.2 42.8   < > 34.2* 30.7* 29.7* 30.2* 28.5*  MCV 95.6   < > 97.7 97.7  --   --  94.5  --  96.5 96.3  PLT 233   < > 206 207  --   --  181  --  226 242   < > = values in this interval not displayed.   Basic Metabolic Panel: Recent Labs  Lab 03/28/24 1753 03/28/24 2140 03/29/24 0111 03/30/24 0529 03/31/24 0500 04/01/24 0007 04/02/24 0602  NA  --    < > 141 141 137 137 139  K  --    < > 4.1 3.7 3.9 4.4  4.0  CL  --    < > 101 99 95* 97* 99  CO2  --    < > 26 30 31 29 29   GLUCOSE  --    < > 104* 96 102* 139* 97  BUN  --    < > 44* 38* 43* 70* 85*  CREATININE  --    < > 1.73* 1.67* 1.68* 1.86* 2.02*  CALCIUM   --    < > 9.3 9.1 9.0 8.6* 8.8*  MG 2.3  --  2.2 2.1 2.2 2.1  --   PHOS  --   --  3.4 3.0 3.1 2.0* 4.3   < > = values in this interval not displayed.   GFR: Estimated Creatinine Clearance: 23 mL/min (A) (by C-G formula based on SCr of 2.02 mg/dL (H)). Liver Function Tests: Recent Labs  Lab 03/29/24 0111 03/30/24 0529 03/31/24 0500 04/01/24 0007 04/02/24 0602  AST 19  --   --   --   --   ALT 10  --   --   --   --   ALKPHOS 79  --   --   --   --   BILITOT 0.5  --   --   --   --   PROT 6.9  --   --   --   --   ALBUMIN  3.6 3.4* 3.2* 3.0* 3.0*   No results for input(s): LIPASE, AMYLASE in the last 168 hours. No results for input(s): AMMONIA in the last 168 hours. Coagulation Profile: No results for input(s): INR, PROTIME in the last 168 hours. Cardiac Enzymes: No results for input(s): CKTOTAL, CKMB, CKMBINDEX, TROPONINI in the last 168 hours. BNP (last 3 results) Recent Labs    03/28/24 1753  PROBNP 698.0*   HbA1C: No results for input(s): HGBA1C in the last 72  hours. CBG: Recent Labs  Lab 04/02/24 1145 04/02/24 1608 04/02/24 2004 04/02/24 2357 04/03/24 0728  GLUCAP 143* 160* 182* 135* 100*   Lipid Profile: No results for input(s): CHOL, HDL, LDLCALC, TRIG, CHOLHDL, LDLDIRECT in the last 72 hours. Thyroid  Function Tests: No results for input(s): TSH, T4TOTAL, FREET4, T3FREE, THYROIDAB in the last 72 hours. Anemia Panel: No results for input(s): VITAMINB12, FOLATE, FERRITIN, TIBC, IRON, RETICCTPCT in the last 72 hours. Sepsis Labs: Recent Labs  Lab 03/28/24 1618 03/28/24 2140  PROCALCITON  --  <0.10  LATICACIDVEN 1.2  --     Recent Results (from the past 240 hours)  Respiratory (~20 pathogens) panel by PCR     Status: None   Collection Time: 03/29/24 12:30 AM   Specimen: Nasopharyngeal Swab; Respiratory  Result Value Ref Range Status   Adenovirus NOT DETECTED NOT DETECTED Final   Coronavirus 229E NOT DETECTED NOT DETECTED Final    Comment: (NOTE) The Coronavirus on the Respiratory Panel, DOES NOT test for the novel  Coronavirus (2019 nCoV)    Coronavirus HKU1 NOT DETECTED NOT DETECTED Final   Coronavirus NL63 NOT DETECTED NOT DETECTED Final   Coronavirus OC43 NOT DETECTED NOT DETECTED Final   Metapneumovirus NOT DETECTED NOT DETECTED Final   Rhinovirus / Enterovirus NOT DETECTED NOT DETECTED Final   Influenza A NOT DETECTED NOT DETECTED Final   Influenza B NOT DETECTED NOT DETECTED Final   Parainfluenza Virus 1 NOT DETECTED NOT DETECTED Final   Parainfluenza Virus 2 NOT DETECTED NOT DETECTED Final   Parainfluenza Virus 3 NOT DETECTED NOT DETECTED Final   Parainfluenza Virus 4 NOT DETECTED NOT DETECTED Final  Respiratory Syncytial Virus NOT DETECTED NOT DETECTED Final   Bordetella pertussis NOT DETECTED NOT DETECTED Final   Bordetella Parapertussis NOT DETECTED NOT DETECTED Final   Chlamydophila pneumoniae NOT DETECTED NOT DETECTED Final   Mycoplasma pneumoniae NOT DETECTED NOT DETECTED  Final    Comment: Performed at [facility], [address] [place], [place] [address]  SARS Coronavirus 2 by RT PCR (hospital order, performed in [facility] hospital lab) *cepheid single result test* Nasopharyngeal Swab     Status: None   Collection Time: 03/29/24 12:30 AM   Specimen: Nasopharyngeal Swab; Nasal Swab  Result Value Ref Range Status   SARS Coronavirus 2 by RT PCR NEGATIVE NEGATIVE Final    Comment: (NOTE) SARS-CoV-2 target nucleic acids are NOT DETECTED.  The SARS-CoV-2 RNA is generally detectable in upper and lower respiratory specimens during the acute phase of infection. The lowest concentration of SARS-CoV-2 viral copies this assay can detect is 250 copies / mL. A negative result does not preclude SARS-CoV-2 infection and should not be used as the sole basis for treatment or other patient management decisions.  A negative result may occur with improper specimen collection / handling, submission of specimen other than nasopharyngeal swab, presence of viral mutation(s) within the areas targeted by this assay, and inadequate number of viral copies (<250 copies / mL). A negative result must be combined with clinical observations, patient history, and epidemiological information.  Fact Sheet for Patients:   RoadLapTop.co.za  Fact Sheet for Healthcare Providers: http://kim-miller.com/  This test is not yet approved or  cleared by the [place]  FDA and has been authorized for detection and/or diagnosis of SARS-CoV-2 by FDA under an Emergency Use Authorization (EUA).  This EUA will remain in effect (meaning this test can be used) for the duration of the COVID-19 declaration under Section 564(b)(1) of the Act, 21 U.S.C. section 360bbb-3(b)(1), unless the authorization is terminated or revoked sooner.  Performed at [facility], [address], [place], [place] [address]          Radiology  Studies: No results found.      Scheduled Meds:  (feeding supplement) PROSource Plus  30 mL Oral Daily   allopurinol  100 mg Oral Daily   apixaban   5 mg Oral BID   colchicine  0.6 mg Oral Daily   feeding supplement  237 mL Oral BID BM   insulin  aspart  0-5 Units Subcutaneous QHS   insulin  aspart  0-9 Units Subcutaneous TID WC   metoprolol  succinate  25 mg Oral Daily   multivitamin with minerals  1 tablet Oral Daily   pantoprazole  (PROTONIX ) IV  40 mg Intravenous Q12H   pravastatin   20 mg Oral q1800   predniSONE   40 mg Oral QAC breakfast   sodium chloride  flush  3 mL Intravenous Q12H   Continuous Infusions:   LOS: 6 days    Time spent: 35 minutes    Mattilyn Crites A Rayhaan Huster, MD [facility]   If 7PM-7AM, please contact night-coverage [email or web address]  04/03/2024, 7:43 AM

## 2024-04-04 DIAGNOSIS — D62 Acute posthemorrhagic anemia: Secondary | ICD-10-CM | POA: Diagnosis not present

## 2024-04-04 DIAGNOSIS — I509 Heart failure, unspecified: Secondary | ICD-10-CM | POA: Diagnosis not present

## 2024-04-04 LAB — CBC
HCT: 30.8 % — ABNORMAL LOW (ref 36.0–46.0)
Hemoglobin: 9.7 g/dL — ABNORMAL LOW (ref 12.0–15.0)
MCH: 30.8 pg (ref 26.0–34.0)
MCHC: 31.5 g/dL (ref 30.0–36.0)
MCV: 97.8 fL (ref 80.0–100.0)
Platelets: 271 K/uL (ref 150–400)
RBC: 3.15 MIL/uL — ABNORMAL LOW (ref 3.87–5.11)
RDW: 14.2 % (ref 11.5–15.5)
WBC: 11.9 K/uL — ABNORMAL HIGH (ref 4.0–10.5)
nRBC: 0 % (ref 0.0–0.2)

## 2024-04-04 LAB — GLUCOSE, CAPILLARY: Glucose-Capillary: 87 mg/dL (ref 70–99)

## 2024-04-04 LAB — BASIC METABOLIC PANEL WITH GFR
Anion gap: 10 (ref 5–15)
BUN: 87 mg/dL — ABNORMAL HIGH (ref 8–23)
CO2: 29 mmol/L (ref 22–32)
Calcium: 8.9 mg/dL (ref 8.9–10.3)
Chloride: 104 mmol/L (ref 98–111)
Creatinine, Ser: 1.77 mg/dL — ABNORMAL HIGH (ref 0.44–1.00)
GFR, Estimated: 27 mL/min — ABNORMAL LOW (ref >60)
Glucose, Bld: 98 mg/dL (ref 70–99)
Potassium: 4.2 mmol/L (ref 3.5–5.1)
Sodium: 143 mmol/L (ref 135–145)

## 2024-04-04 MED ORDER — FUROSEMIDE 20 MG PO TABS: 20.0000 mg | ORAL_TABLET | Freq: Every day | ORAL | 1 refills | Status: AC

## 2024-04-04 MED ORDER — ALLOPURINOL 100 MG PO TABS: 100.0000 mg | ORAL_TABLET | Freq: Every day | ORAL | 0 refills | Status: AC

## 2024-04-04 MED ORDER — ADULT MULTIVITAMIN W/MINERALS CH: 1.0000 | ORAL_TABLET | Freq: Every day | ORAL | 0 refills | Status: AC

## 2024-04-04 MED ORDER — APIXABAN 2.5 MG PO TABS: 2.5000 mg | ORAL_TABLET | Freq: Two times a day (BID) | ORAL | 0 refills | Status: DC

## 2024-04-04 MED ORDER — COLCHICINE 0.6 MG PO TABS: 0.6000 mg | ORAL_TABLET | Freq: Every day | ORAL | 0 refills | Status: DC

## 2024-04-04 MED ORDER — FUROSEMIDE 20 MG PO TABS
20.0000 mg | ORAL_TABLET | Freq: Every day | ORAL | Status: DC
  Administered 2024-04-04: 20 mg via ORAL
  Filled 2024-04-04: qty 1

## 2024-04-04 MED ORDER — PANTOPRAZOLE SODIUM 40 MG PO TBEC: 40.0000 mg | DELAYED_RELEASE_TABLET | Freq: Two times a day (BID) | ORAL | 1 refills | Status: AC

## 2024-04-04 MED ORDER — FERROUS SULFATE 325 (65 FE) MG PO TBEC
325.0000 mg | DELAYED_RELEASE_TABLET | Freq: Every day | ORAL | 0 refills | Status: DC
Start: 2024-04-04 — End: 2024-04-18

## 2024-04-04 MED ORDER — METOPROLOL SUCCINATE ER 25 MG PO TB24: 25.0000 mg | ORAL_TABLET | Freq: Every day | ORAL | 0 refills | Status: AC

## 2024-04-04 NOTE — Plan of Care (Signed)
  Problem: Fluid Volume: Goal: Ability to maintain a balanced intake and output will improve Outcome: Progressing   Problem: Clinical Measurements: Goal: Diagnostic test results will improve Outcome: Progressing Goal: Respiratory complications will improve Outcome: Progressing Goal: Cardiovascular complication will be avoided Outcome: Progressing   Problem: Activity: Goal: Risk for activity intolerance will decrease Outcome: Progressing   Problem: Pain Managment: Goal: General experience of comfort will improve and/or be controlled Outcome: Progressing

## 2024-04-04 NOTE — Progress Notes (Signed)
 Progress Note   Subjective  Chief Complaint: Hematochezia  Today, patient had a bowel movement that was black in coloration.  In general she is feeling much better and not requiring oxygen  anymore and feels like she is ready to go home.  Discussed with hospitalist who is planning on sending her home.  Hemoglobin increased overnight.  Things seem stable on her Eliquis  over the past 48 hours.  Hemoglobin 9.1--> 9.7 overnight.   Objective   Vital signs in last 24 hours: Temp:  [97.6 F (36.4 C)-98.3 F (36.8 C)] 97.9 F (36.6 C) (10/02 0529) Pulse Rate:  [70-88] 88 (10/02 0529) Resp:  [14-23] 14 (10/02 0529) BP: (132-154)/(52-73) 154/52 (10/02 0529) SpO2:  [91 %-99 %] 99 % (10/02 0529) Last BM Date : 04/03/24 General:   Elderly, obese white female in NAD Heart:  Regular rate and rhythm; no murmurs Lungs: Respirations even and unlabored, lungs CTA bilaterally Abdomen:  Soft, nontender and nondistended. Normal bowel sounds. Psych:  Cooperative. Normal mood and affect.  Intake/Output from previous day: 10/01 0701 - 10/02 0700 In: 480 [P.O.:480] Out: 550 [Urine:550]   Lab Results: Recent Labs    04/02/24 0602 04/03/24 0502 04/04/24 0509  WBC 14.2* 12.5* 11.9*  HGB 9.7* 9.1* 9.7*  HCT 30.2* 28.5* 30.8*  PLT 226 242 271   BMET Recent Labs    04/02/24 0602 04/03/24 0502 04/04/24 0509  NA 139 140 143  K 4.0 4.3 4.2  CL 99 101 104  CO2 29 27 29   GLUCOSE 97 102* 98  BUN 85* 89* 87*  CREATININE 2.02* 1.90* 1.77*  CALCIUM  8.8* 8.6* 8.9   LFT Recent Labs    04/02/24 0602  ALBUMIN  3.0*    Assessment / Plan:   Pamela [name] is an 89 year old female with a past medical history as listed below including diabetes type 2, CKD stage IV, anemia of chronic disease, history of A-fib, PE currently off Eliquis , combined systolic and diastolic CHF, who presented to the ED with worsening lower extremity swelling and inability to walk. We were consulted in regards to GI bleed  and anemia.   Assessment: 1.  Hematochezia: Initially thought diverticular based on overall clinical scenario, patient declined nuc med bleeding scan due to lengthy exam and could not undergo CTA given GFR, continued bleeding a total of 10 times on 9/28 a mixture of bright red blood maroon-colored blood, hemoglobin dramatically dropped from 13.1--> 9.6, EGD pursued 9/30 with a nonbleeding gastric ulcer clipped and treated, hemoglobin stable overnight currently 9.7-->9.1-->9.7, patient's Eliquis  was restarted on 04/02/24, one further black BM this morning; presumed upper GI bleed with findings of gastric ulcer on recent EGD, hemoglobin stable overnight 2.  A-fib: Eliquis , restarted 04/02/2024 3.  CKD 4.  Acute on chronic combined systolic and diastolic CHF with acute respiratory failure and hypoxia: 2D echo 6/23 with an EF of 45-50%, no longer requiring O2 5.  Acute gout: Knee x-ray with severe tricompartmental osteoarthritis placed on colchicine and prednisone  6.  Generalized debility/venous stasis dermatitis   Plan: 1.  We are okay with patient discharge home today from a GI standpoint. 2.  Working on getting the patient to follow-up in clinic in the next 4 to 6 weeks so that we can further arrange for her EGD for follow-up of gastric ulcer. 3.  Patient needs to continue on Pantoprazole  40 mg p.o. twice daily at discharge.  Thank you for your kind consultation, we will sign off.   LOS: 7 days  Delon Gibson [name]  04/04/2024, 9:33 AM

## 2024-04-04 NOTE — Discharge Summary (Signed)
 Physician Discharge Summary   Patient: Pamela [name] MRN: [id number] DOB: 1934/07/31  Admit date:     03/28/2024  Discharge date: 04/04/24  Discharge Physician: Owen DELENA Lore   PCP: [name], MD   Recommendations at discharge:   Needs cbc to follow hb.  Needs follow up with cardiology for further care of HF. Follow up with GI for repeat endoscopy in 6-8 weeks.   Discharge Diagnoses: Principal Problem:   CHF exacerbation (HCC) Active Problems:   Primary hypertension   Mixed hyperlipidemia   CKD (chronic kidney disease) stage 3, GFR 30-59 ml/min (HCC)   Type 2 diabetes mellitus, without long-term current use of insulin  (HCC)   Acute on chronic respiratory failure with hypoxia (HCC)   Acute on chronic heart failure with mildly reduced ejection fraction (HFmrEF, 41-49%) (HCC)   PAF (paroxysmal atrial fibrillation) (HCC)   Prolonged QT interval   History of pulmonary embolism   Debility   Venous stasis dermatitis   Elevated troponin   Coronary artery calcification seen on CAT scan   Pure hypercholesterolemia   PAD (peripheral artery disease)   AKI (acute kidney injury)   Acute gastrointestinal bleeding   ABLA (acute blood loss anemia)   Gastric ulcer  Resolved Problems:   * No resolved hospital problems. *  Hospital Course: 89 year old with past medical history significant for diabetes type 2, CKD stage IV, anemia of chronic disease, history of A-fib, PE off of Eliquis , combined systolic and diastolic heart failure presented to the ED with worsening lower extremity swelling and inability to ambulate.  Patient had had a fall which made it difficult for her to walk and she has been mainly sitting in the recliner with her legs down which made her edema worse.  She reports abdominal swelling she required up to 4 L of oxygen  in the ED, does not use home oxygen .  She was started on antibiotics after a fall 5 weeks ago and treated for lower extremity cellulitis.  She was  transition to torsemide as an outpatient and feel it is not working.    Assessment and Plan: 1-Acute on chronic combined systolic and diastolic heart failure Acute hypoxic respiratory failure: - 2D echo 2023 ejection fraction 45 to 50% grade 2 diastolic dysfunction - Presented with lower extremity edema, hypoxia, shortness of breath requiring 4 L of oxygen .  Elevated BNP chest x-ray with volume overload. - She was diuresed with IV Lasix  which was subsequently stopped due to increased creatinine - -3.6 L. - Recheck cardiology evaluation, needs Zio patch as an outpatient - Dopplers and VQ scan negative for PE DVT -2D echo repeated ejection fraction 65%, diastolic dysfunction grade -Baseline around 1.4---1.6 -Cr improved, down to 1.7. resume lasix  20 mg daily.    Acute respiratory failure with hypoxia History of PE: - Setting of heart failure exacerbation. - Treated with IV Lasix . - VQ scan and Doppler negative for PE and DVT GI recommended resumption of Eliquis .   Acute gout - Knee x-ray showed severe arthritic compartmental osteoarthritis Uric acid 9.2, CRP 6.1 To follow-up with Ortho as an outpatient. Improved on colchicine and prednisone .  Started allopurinol Completed prednisone  and colchicine.   Acute GI bleed: -Underwent  EGD which showed normal esophagus, gastritis, nonbleeding gastric ulcer with no stigmata of bleeding, clips placed.  Nonbleeding duodenal diverticulum along the duodenal sweep.  - hb stable. Increased to 9.7. had dark stool today, suspect old blood.    Paroxysmal A-fib - Patient to resume Eliquis  per GI  monitor hemoglobin - [place] score 7   Generalized debility venous stasis dermatitis - Worsening in the setting of volume overload. - Improve   AKI on CKD stage IIIb - Presented with a creatinine of 1.6 increased to 2.0.  In the setting of acute diuresis.  Lasix  was held.  Cr improved, down to 1.7   Elevated troponin, hyperlipidemia: - Likely demand  ischemia, no acute chest pain.   PAD, CAD calcification on CT chest -LDL 44. Vascular surgery was consulted by cardiology, Dr. Gretta felt chronic aortic atherosclerosis changes with not further imaging needed, and did not preclude anticoagulation.   Obesity class III - Needs lifestyle modification         Nutrition Problem: Inadequate oral intake Etiology: acute illness       Signs/Symptoms: per patient/family report       Interventions: Ensure Enlive (each supplement provides 350kcal and 20 grams of protein), MVI, Refer to RD note for recommendations   Estimated body mass index is 44.35 kg/m as calculated from the following:   Height as of this encounter: 5' 2 (1.575 m).   Weight as of this encounter: 110 kg.          Consultants: GI, Cardiology  Procedures performed: none Disposition: Home Diet recommendation:  Discharge Diet Orders (From admission, onward)     Start     Ordered   04/04/24 0000  Diet - low sodium heart healthy        04/04/24 0918   04/04/24 0000  Diet Carb Modified        04/04/24 0918           Cardiac diet DISCHARGE MEDICATION: Allergies as of 04/04/2024       Reactions   Levaquin [levofloxacin In D5w] Nausea And Vomiting   Levofloxacin Nausea And Vomiting, Other (See Comments)   Abdominal pain, also   Ensure Diarrhea   Lisinopril Cough   Oxycodone  Nausea Only        Medication List     STOP taking these medications    losartan  25 MG tablet Commonly known as: COZAAR    torsemide 20 MG tablet Commonly known as: DEMADEX       TAKE these medications    Align 4 MG Caps Take 4 mg by mouth daily.   allopurinol 100 MG tablet Commonly known as: ZYLOPRIM Take 1 tablet (100 mg total) by mouth daily.   ALPRAZolam  0.25 MG tablet Commonly known as: XANAX  Take 0.25 mg by mouth 2 (two) times daily as needed for anxiety.   apixaban  2.5 MG Tabs tablet Commonly known as: ELIQUIS  Take 1 tablet (2.5 mg total) by mouth 2  (two) times daily. What changed:  medication strength how much to take   calcium  carbonate 1500 (600 Ca) MG Tabs tablet Commonly known as: OSCAL Take 1,500 mg by mouth daily with breakfast.   dapagliflozin  propanediol 10 MG Tabs tablet Commonly known as: FARXIGA  Take 10 mg by mouth daily.   diclofenac  Sodium 1 % Gel Commonly known as: VOLTAREN  Apply 2 g topically 4 (four) times daily. What changed:  when to take this reasons to take this   ferrous sulfate 325 (65 FE) MG EC tablet Take 1 tablet (325 mg total) by mouth daily with breakfast.   furosemide  20 MG tablet Commonly known as: LASIX  Take 1 tablet (20 mg total) by mouth daily. What changed:  when to take this reasons to take this   HYDROcodone -acetaminophen  5-325 MG tablet Commonly known as: NORCO/VICODIN Take  1 tablet by mouth See admin instructions. Take 1 tablet by mouth every 6-8 hours as needed for pain   lovastatin 20 MG tablet Commonly known as: MEVACOR Take 20 mg by mouth at bedtime.   metoprolol  succinate 25 MG 24 hr tablet Commonly known as: TOPROL -XL Take 1 tablet (25 mg total) by mouth daily. Take with or immediately following a meal.   multivitamin with minerals Tabs tablet Take 1 tablet by mouth daily.   OneTouch Delica Plus Lancet30G Misc   OVER THE COUNTER MEDICATION Take 1 capsule by mouth daily. Daily fiber 100% psyllium- Take 1 capsule by mouth once a day   pantoprazole  40 MG tablet Commonly known as: Protonix  Take 1 tablet (40 mg total) by mouth 2 (two) times daily.   traZODone  50 MG tablet Commonly known as: DESYREL  Take 100 mg by mouth at bedtime as needed for sleep.   Tylenol  8 Hour Arthritis Pain 650 MG CR tablet Generic drug: acetaminophen  Take 1,300 mg by mouth in the morning.        Follow-up Information     [facility] .   Specialty: Emergency Medicine Contact information: [address] [place]  [place] [address] [phone]        [facility] Follow up.   Specialty: Home Health Services Why: [facility] will provide RN, PT, and OT in the home after discharge. Contact information: [address] [place] [address] [phone]         [name], MD Follow up in 1 week(s).   Specialty: Family Medicine Why: need appointment to have your hemoglobine check. Contact information: [address] [place] [address] [phone]         Shlomo Wilbert SAUNDERS, MD Follow up in 1 week(s).   Specialty: Cardiology Contact information: [address] [place] [address] [phone]                Discharge Exam: Fredricka Weights   04/01/24 0356 04/01/24 1000 04/02/24 0500  Weight: 111.4 kg 109.7 kg 110 kg   General; NAD  Condition at discharge: stable  The results of significant diagnostics from this hospitalization (including imaging, microbiology, ancillary and laboratory) are listed below for reference.   Imaging Studies: VAS US  LOWER EXTREMITY VENOUS (DVT) Result Date: 03/31/2024  Lower Venous DVT Study Patient Name:  DORREEN VALIENTE  Date of Exam:   03/31/2024 Medical Rec #: [id number]       Accession #:    [id number] Date of Birth: 1934/10/01        Patient Gender: F Patient Age:   89 years Exam Location:  [facility] Procedure:      VAS US  LOWER EXTREMITY VENOUS (DVT) Referring Phys: BLEASE DOUTOVA --------------------------------------------------------------------------------  Indications: Swelling, Pain, and pulmonary embolism.  Risk Factors: Confirmed PE obesity. Anticoagulation: Eliquis . Comparison Study: 02/16/22 -Negative left Lower ext                   01/01/22 - Negative right lwer ext Performing Technologist: Ricka Sturdivant-Jones RDMS, RVT  Examination Guidelines: A complete evaluation includes B-mode imaging, spectral Doppler, color Doppler, and power Doppler as needed of all accessible portions of each  vessel. Bilateral testing is considered an integral part of a complete examination. Limited examinations for reoccurring indications may be performed as noted. The reflux portion of the exam is performed with the patient in reverse Trendelenburg.  +---------+---------------+---------+-----------+----------+--------------+ RIGHT  CompressibilityPhasicitySpontaneityPropertiesThrombus Aging +---------+---------------+---------+-----------+----------+--------------+ CFV      Full           Yes      Yes                                 +---------+---------------+---------+-----------+----------+--------------+ SFJ      Full                                                        +---------+---------------+---------+-----------+----------+--------------+ FV Prox  Full                                                        +---------+---------------+---------+-----------+----------+--------------+ FV Mid   Full           Yes      Yes                                 +---------+---------------+---------+-----------+----------+--------------+ FV DistalFull                                                        +---------+---------------+---------+-----------+----------+--------------+ PFV      Full                                                        +---------+---------------+---------+-----------+----------+--------------+ POP      Full           Yes      Yes                                 +---------+---------------+---------+-----------+----------+--------------+ PTV      Full                                                        +---------+---------------+---------+-----------+----------+--------------+ PERO     Full                                                        +---------+---------------+---------+-----------+----------+--------------+   +---------+---------------+---------+-----------+----------+--------------+ LEFT      CompressibilityPhasicitySpontaneityPropertiesThrombus Aging +---------+---------------+---------+-----------+----------+--------------+ CFV      Full           Yes      Yes                                 +---------+---------------+---------+-----------+----------+--------------+  SFJ      Full                                                        +---------+---------------+---------+-----------+----------+--------------+ FV Prox  Full                                                        +---------+---------------+---------+-----------+----------+--------------+ FV Mid   Full           Yes      Yes                                 +---------+---------------+---------+-----------+----------+--------------+ FV DistalFull                                                        +---------+---------------+---------+-----------+----------+--------------+ PFV      Full                                                        +---------+---------------+---------+-----------+----------+--------------+ POP      Full           Yes      Yes                                 +---------+---------------+---------+-----------+----------+--------------+ PTV      Full                                                        +---------+---------------+---------+-----------+----------+--------------+ PERO     Full                                                        +---------+---------------+---------+-----------+----------+--------------+     Summary: BILATERAL: - No evidence of deep vein thrombosis seen in the lower extremities, bilaterally. -No evidence of popliteal cyst, bilaterally.   *See table(s) above for measurements and observations. Electronically signed by Lonni Gaskins MD on 03/31/2024 at 2:39:33 PM.    Final    DG Knee Right Port Result Date: 03/30/2024 EXAM: 1 or 2 VIEW(S) XRAY OF THE KNEE 03/30/2024 05:56:00 PM COMPARISON: None available. CLINICAL  HISTORY: Pain 144615. Fall and pain. FINDINGS: BONES AND JOINTS: No acute fracture. No focal osseous lesion. No joint dislocation. Small joint effusion. Moderate to severe tricompartmental degenerative changes with joint space narrowing and osteophyte formation. SOFT TISSUES: Vascular calcifications. IMPRESSION: 1. Moderate  to severe tricompartmental osteoarthritis with joint space narrowing and osteophyte formation. 2. Small knee joint effusion. Electronically signed by: Norman Gatlin MD 03/30/2024 07:23 PM EDT RP Workstation: [id number]   DG Ankle Right Port Result Date: 03/30/2024 EXAM: 1 or 2 VIEW(S) XRAY OF THE ANKLE 03/30/2024 05:56:00 PM CLINICAL HISTORY: Pain 855384. Fall and pain. COMPARISON: None available. FINDINGS: BONES AND JOINTS: No acute fracture. No focal osseous lesion. No joint dislocation. Plantar calcaneal spur. Tibiotalar joint space narrowing with marginal osteophytes. SOFT TISSUES: Subcutaneous soft tissue edema. IMPRESSION: 1. No acute osseous abnormality. Electronically signed by: Norman Gatlin MD 03/30/2024 07:22 PM EDT RP Workstation: [id number]   ECHOCARDIOGRAM COMPLETE Result Date: 03/29/2024    ECHOCARDIOGRAM REPORT   Patient Name:   RAECHAL RABEN Date of Exam: 03/29/2024 Medical Rec #:  [id number]      Height:       62.0 in Accession #:    [id number]     Weight:       255.5 lb Date of Birth:  Apr 18, 1935       BSA:          2.121 m Patient Age:    89 years       BP:           150/77 mmHg Patient Gender: F              HR:           96 bpm. Exam Location:  Inpatient Procedure: 2D Echo, Cardiac Doppler, Color Doppler and Strain Analysis (Both            Spectral and Color Flow Doppler were utilized during procedure). Indications:    CHF I50.21  History:        Patient has prior history of Echocardiogram examinations, most                 recent 12/29/2021. CHF, Signs/Symptoms:Edema and Shortness of                 Breath; Risk Factors:Diabetes.  Sonographer:    BERNARDA ROCKS  Referring Phys: Domingo.Diones ANASTASSIA DOUTOVA IMPRESSIONS  1. Left ventricular ejection fraction, by estimation, is 60 to 65%. The left ventricle has normal function. The left ventricle has no regional wall motion abnormalities. There is mild left ventricular hypertrophy. Left ventricular diastolic parameters are consistent with Grade I diastolic dysfunction (impaired relaxation). The average left ventricular global longitudinal strain is -15.5 %. The global longitudinal strain is abnormal.  2. Right ventricular systolic function is normal. The right ventricular size is normal.  3. The mitral valve is normal in structure. No evidence of mitral valve regurgitation. No evidence of mitral stenosis.  4. The aortic valve is normal in structure. Aortic valve regurgitation is not visualized. No aortic stenosis is present.  5. The inferior vena cava is normal in size with greater than 50% respiratory variability, suggesting right atrial pressure of 3 mmHg. FINDINGS  Left Ventricle: Left ventricular ejection fraction, by estimation, is 60 to 65%. The left ventricle has normal function. The left ventricle has no regional wall motion abnormalities. The average left ventricular global longitudinal strain is -15.5 %. Strain was performed and the global longitudinal strain is abnormal. The left ventricular internal cavity size was normal in size. There is mild left ventricular hypertrophy. Left ventricular diastolic parameters are consistent with Grade I diastolic dysfunction (impaired relaxation). Right Ventricle: The right ventricular size is normal. No increase in right ventricular wall thickness. Right ventricular systolic function  is normal. Left Atrium: Left atrial size was normal in size. Right Atrium: Right atrial size was normal in size. Pericardium: There is no evidence of pericardial effusion. Mitral Valve: The mitral valve is normal in structure. Mild mitral annular calcification. No evidence of mitral valve regurgitation.  No evidence of mitral valve stenosis. MV peak gradient, 8.3 mmHg. The mean mitral valve gradient is 4.0 mmHg. Tricuspid Valve: The tricuspid valve is normal in structure. Tricuspid valve regurgitation is not demonstrated. No evidence of tricuspid stenosis. Aortic Valve: The aortic valve is normal in structure. Aortic valve regurgitation is not visualized. No aortic stenosis is present. Aortic valve mean gradient measures 3.0 mmHg. Aortic valve peak gradient measures 6.7 mmHg. Aortic valve area, by VTI measures 2.61 cm. Pulmonic Valve: The pulmonic valve was normal in structure. Pulmonic valve regurgitation is trivial. No evidence of pulmonic stenosis. Aorta: The aortic root is normal in size and structure. Venous: The inferior vena cava is normal in size with greater than 50% respiratory variability, suggesting right atrial pressure of 3 mmHg. IAS/Shunts: No atrial level shunt detected by color flow Doppler.  LEFT VENTRICLE PLAX 2D LVIDd:         4.00 cm      Diastology LVIDs:         3.00 cm      LV e' medial:    5.97 cm/s LV PW:         1.20 cm      LV E/e' medial:  14.2 LV IVS:        1.30 cm      LV e' lateral:   8.39 cm/s LVOT diam:     1.90 cm      LV E/e' lateral: 10.1 LV SV:         62 LV SV Index:   29           2D Longitudinal Strain LVOT Area:     2.84 cm     2D Strain GLS Avg:     -15.5 %  LV Volumes (MOD) LV vol d, MOD A2C: 183.0 ml LV vol d, MOD A4C: 146.0 ml LV vol s, MOD A2C: 59.3 ml LV vol s, MOD A4C: 50.8 ml LV SV MOD A2C:     123.7 ml LV SV MOD A4C:     146.0 ml LV SV MOD BP:      107.6 ml RIGHT VENTRICLE             IVC RV Basal diam:  3.40 cm     IVC diam: 1.50 cm RV S prime:     24.40 cm/s TAPSE (M-mode): 2.8 cm LEFT ATRIUM             Index        RIGHT ATRIUM          Index LA diam:        3.90 cm 1.84 cm/m   RA Area:     9.95 cm LA Vol (A2C):   44.3 ml 20.88 ml/m  RA Volume:   19.90 ml 9.38 ml/m LA Vol (A4C):   41.1 ml 19.37 ml/m LA Biplane Vol: 44.4 ml 20.93 ml/m  AORTIC VALVE                     PULMONIC VALVE AV Area (Vmax):    2.57 cm     PV Vmax:          1.33 m/s AV Area (Vmean):  2.27 cm     PV Peak grad:     7.1 mmHg AV Area (VTI):     2.61 cm     PR End Diast Vel: 2.81 msec AV Vmax:           129.00 cm/s AV Vmean:          76.800 cm/s AV VTI:            0.236 m AV Peak Grad:      6.7 mmHg AV Mean Grad:      3.0 mmHg LVOT Vmax:         117.00 cm/s LVOT Vmean:        61.600 cm/s LVOT VTI:          0.217 m LVOT/AV VTI ratio: 0.92  AORTA Ao Root diam: 2.60 cm Ao Asc diam:  3.40 cm MITRAL VALVE                TRICUSPID VALVE MV Area (PHT): 4.99 cm     TR Peak grad:   32.7 mmHg MV Area VTI:   2.04 cm     TR Vmax:        286.00 cm/s MV Peak grad:  8.3 mmHg MV Mean grad:  4.0 mmHg     SHUNTS MV Vmax:       1.44 m/s     Systemic VTI:  0.22 m MV Vmean:      89.4 cm/s    Systemic Diam: 1.90 cm MV Decel Time: 152 msec MV E velocity: 85.00 cm/s MV A velocity: 149.00 cm/s MV E/A ratio:  0.57 Oneil Parchment MD Electronically signed by Oneil Parchment MD Signature Date/Time: 03/29/2024/3:50:25 PM    Final    NM Pulmonary Perfusion Result Date: 03/29/2024 CLINICAL DATA:  Pulmonary embolism (PE) suspected, low to intermediate prob, positive D-dimer History of pulmonary embolism. EXAM: NUCLEAR MEDICINE PERFUSION LUNG SCAN TECHNIQUE: Perfusion images were obtained in multiple projections after intravenous injection of radiopharmaceutical. No ventilation imaging performed. RADIOPHARMACEUTICALS:  3.2 mCi Tc-54m MAA IV COMPARISON:  Chest radiographs 03/28/2024.  Chest CTA 04/30/2021. FINDINGS: Low lung volumes with elevation of the right hemidiaphragm. Lateral and oblique views are limited by body habitus. There are no wedge-shaped perfusion defects to suggest acute pulmonary embolism. IMPRESSION: No evidence of acute pulmonary embolism on perfusion scintigraphy by PISAPED criteria. Electronically Signed   By: Elsie Perone M.D.   On: 03/29/2024 14:27   DG Chest Port 1 View Result Date:  03/28/2024 CLINICAL DATA:  Lower extremity swelling.  Fell 5 weeks ago. EXAM: PORTABLE CHEST 1 VIEW COMPARISON:  12/28/2021 FINDINGS: Shallow inspiration with atelectasis in the lung bases. Cardiac enlargement with mild vascular congestion. No obvious edema or consolidation. No pleural effusion or pneumothorax. Mediastinal contours appear intact. Calcified and tortuous aorta. Degenerative changes in the spine and shoulders. IMPRESSION: Shallow inspiration with atelectasis in the lung bases. Cardiac enlargement with pulmonary vascular congestion. No edema. Electronically Signed   By: Elsie Gravely M.D.   On: 03/28/2024 17:17    Microbiology: Results for orders placed or performed during the hospital encounter of 03/28/24  Respiratory (~20 pathogens) panel by PCR     Status: None   Collection Time: 03/29/24 12:30 AM   Specimen: Nasopharyngeal Swab; Respiratory  Result Value Ref Range Status   Adenovirus NOT DETECTED NOT DETECTED Final   Coronavirus 229E NOT DETECTED NOT DETECTED Final    Comment: (NOTE) The Coronavirus on the Respiratory Panel, DOES NOT test for the novel  Coronavirus (2019 nCoV)    Coronavirus HKU1 NOT DETECTED NOT DETECTED Final   Coronavirus NL63 NOT DETECTED NOT DETECTED Final   Coronavirus OC43 NOT DETECTED NOT DETECTED Final   Metapneumovirus NOT DETECTED NOT DETECTED Final   Rhinovirus / Enterovirus NOT DETECTED NOT DETECTED Final   Influenza A NOT DETECTED NOT DETECTED Final   Influenza B NOT DETECTED NOT DETECTED Final   Parainfluenza Virus 1 NOT DETECTED NOT DETECTED Final   Parainfluenza Virus 2 NOT DETECTED NOT DETECTED Final   Parainfluenza Virus 3 NOT DETECTED NOT DETECTED Final   Parainfluenza Virus 4 NOT DETECTED NOT DETECTED Final   Respiratory Syncytial Virus NOT DETECTED NOT DETECTED Final   Bordetella pertussis NOT DETECTED NOT DETECTED Final   Bordetella Parapertussis NOT DETECTED NOT DETECTED Final   Chlamydophila pneumoniae NOT DETECTED NOT  DETECTED Final   Mycoplasma pneumoniae NOT DETECTED NOT DETECTED Final    Comment: Performed at [facility], [address] [place], [place] [address]  SARS Coronavirus 2 by RT PCR (hospital order, performed in [facility] hospital lab) *cepheid single result test* Nasopharyngeal Swab     Status: None   Collection Time: 03/29/24 12:30 AM   Specimen: Nasopharyngeal Swab; Nasal Swab  Result Value Ref Range Status   SARS Coronavirus 2 by RT PCR NEGATIVE NEGATIVE Final    Comment: (NOTE) SARS-CoV-2 target nucleic acids are NOT DETECTED.  The SARS-CoV-2 RNA is generally detectable in upper and lower respiratory specimens during the acute phase of infection. The lowest concentration of SARS-CoV-2 viral copies this assay can detect is 250 copies / mL. A negative result does not preclude SARS-CoV-2 infection and should not be used as the sole basis for treatment or other patient management decisions.  A negative result may occur with improper specimen collection / handling, submission of specimen other than nasopharyngeal swab, presence of viral mutation(s) within the areas targeted by this assay, and inadequate number of viral copies (<250 copies / mL). A negative result must be combined with clinical observations, patient history, and epidemiological information.  Fact Sheet for Patients:   RoadLapTop.co.za  Fact Sheet for Healthcare Providers: http://kim-miller.com/  This test is not yet approved or  cleared by the [place]  FDA and has been authorized for detection and/or diagnosis of SARS-CoV-2 by FDA under an Emergency Use Authorization (EUA).  This EUA will remain in effect (meaning this test can be used) for the duration of the COVID-19 declaration under Section 564(b)(1) of the Act, 21 U.S.C. section 360bbb-3(b)(1), unless the authorization is terminated or revoked sooner.  Performed at [facility], [address], [place], [place] [address]     Labs: CBC: Recent Labs  Lab 03/28/24 1602 03/29/24 0111 03/31/24 0500 03/31/24 1217 04/01/24 0007 04/01/24 0510 04/02/24 0602 04/03/24 0502 04/04/24 0509  WBC 9.7   < > 11.9*  --  17.3*  --  14.2* 12.5* 11.9*  NEUTROABS 7.8*  --   --   --   --   --   --   --   --   HGB 13.6   < > 13.1   < > 10.0* 9.6* 9.7* 9.1* 9.7*  HCT 43.9   < > 42.8   < > 30.7* 29.7* 30.2* 28.5* 30.8*  MCV 95.6   < > 97.7  --  94.5  --  96.5 96.3 97.8  PLT 233   < > 207  --  181  --  226 242 271   < > =  values in this interval not displayed.   Basic Metabolic Panel: Recent Labs  Lab 03/28/24 1753 03/28/24 2140 03/29/24 0111 03/30/24 0529 03/31/24 0500 04/01/24 0007 04/02/24 0602 04/03/24 0502 04/04/24 0509  NA  --    < > 141 141 137 137 139 140 143  K  --    < > 4.1 3.7 3.9 4.4 4.0 4.3 4.2  CL  --    < > 101 99 95* 97* 99 101 104  CO2  --    < > 26 30 31 29 29 27 29   GLUCOSE  --    < > 104* 96 102* 139* 97 102* 98  BUN  --    < > 44* 38* 43* 70* 85* 89* 87*  CREATININE  --    < > 1.73* 1.67* 1.68* 1.86* 2.02* 1.90* 1.77*  CALCIUM   --    < > 9.3 9.1 9.0 8.6* 8.8* 8.6* 8.9  MG 2.3  --  2.2 2.1 2.2 2.1  --   --   --   PHOS  --   --  3.4 3.0 3.1 2.0* 4.3  --   --    < > = values in this interval not displayed.   Liver Function Tests: Recent Labs  Lab 03/29/24 0111 03/30/24 0529 03/31/24 0500 04/01/24 0007 04/02/24 0602  AST 19  --   --   --   --   ALT 10  --   --   --   --   ALKPHOS 79  --   --   --   --   BILITOT 0.5  --   --   --   --   PROT 6.9  --   --   --   --   ALBUMIN  3.6 3.4* 3.2* 3.0* 3.0*   CBG: Recent Labs  Lab 04/03/24 0728 04/03/24 1145 04/03/24 1637 04/03/24 2009 04/04/24 0737  GLUCAP 100* 145* 173* 152* 87    Discharge time spent: greater than 30 minutes.  Signed: Owen DELENA Lore, MD [facility] 04/04/2024

## 2024-04-04 NOTE — TOC Progression Note (Signed)
 Transition of Care [facility]) - Progression Note   Patient Details  Name: TASHONDA PINKUS MRN: [id number] Date of Birth: 11-Jul-1934  Transition of Care [facility]) CM/SW Contact  Duwaine GORMAN Aran, LCSW Phone Number: 04/04/2024, 9:22 AM  Clinical Narrative: Patient will discharge home today. CSW notified Burnard with [facility] of discharge. HH orders placed by hospitalist yesterday. Care management signing off.  Expected Discharge Plan: Home w Home Health Services Barriers to Discharge: Barriers Resolved  Expected Discharge Plan and Services In-house Referral: Clinical Social Work Post Acute Care Choice: Home Health Living arrangements for the past 2 months: Single Family Home Expected Discharge Date: 04/04/24               DME Arranged: N/A DME Agency: NA HH Arranged: RN, Disease Management, PT, OT HH Agency: [facility] Date HH Agency Contacted: 04/03/24 Time HH Agency Contacted: 1357 Representative spoke with at [facility] Agency: Burnard  Social Drivers of Health (SDOH) Interventions SDOH Screenings   Food Insecurity: No Food Insecurity (03/29/2024)  Housing: Low Risk  (03/29/2024)  Transportation Needs: No Transportation Needs (03/29/2024)  Utilities: Not At Risk (03/29/2024)  Social Connections: Unknown (03/29/2024)  Recent Concern: Social Connections - Moderately Isolated (03/29/2024)  Tobacco Use: Medium Risk (03/29/2024)   Readmission Risk Interventions     No data to display

## 2024-04-04 NOTE — Progress Notes (Signed)
 AVS reviewed with Patient and husband, no questions at this time.

## 2024-04-05 ENCOUNTER — Encounter: Payer: Self-pay | Admitting: Physician Assistant

## 2024-04-05 ENCOUNTER — Telehealth: Payer: Self-pay | Admitting: Cardiovascular Disease

## 2024-04-05 DIAGNOSIS — I872 Venous insufficiency (chronic) (peripheral): Secondary | ICD-10-CM | POA: Diagnosis not present

## 2024-04-05 DIAGNOSIS — I2510 Atherosclerotic heart disease of native coronary artery without angina pectoris: Secondary | ICD-10-CM | POA: Diagnosis not present

## 2024-04-05 DIAGNOSIS — D631 Anemia in chronic kidney disease: Secondary | ICD-10-CM | POA: Diagnosis not present

## 2024-04-05 DIAGNOSIS — R32 Unspecified urinary incontinence: Secondary | ICD-10-CM | POA: Diagnosis not present

## 2024-04-05 DIAGNOSIS — N179 Acute kidney failure, unspecified: Secondary | ICD-10-CM | POA: Diagnosis not present

## 2024-04-05 DIAGNOSIS — K259 Gastric ulcer, unspecified as acute or chronic, without hemorrhage or perforation: Secondary | ICD-10-CM | POA: Diagnosis not present

## 2024-04-05 DIAGNOSIS — Z7901 Long term (current) use of anticoagulants: Secondary | ICD-10-CM | POA: Diagnosis not present

## 2024-04-05 DIAGNOSIS — G8929 Other chronic pain: Secondary | ICD-10-CM | POA: Diagnosis not present

## 2024-04-05 DIAGNOSIS — K5750 Diverticulosis of both small and large intestine without perforation or abscess without bleeding: Secondary | ICD-10-CM | POA: Diagnosis not present

## 2024-04-05 DIAGNOSIS — E782 Mixed hyperlipidemia: Secondary | ICD-10-CM | POA: Diagnosis not present

## 2024-04-05 DIAGNOSIS — Z6841 Body Mass Index (BMI) 40.0 and over, adult: Secondary | ICD-10-CM | POA: Diagnosis not present

## 2024-04-05 DIAGNOSIS — M8580 Other specified disorders of bone density and structure, unspecified site: Secondary | ICD-10-CM | POA: Diagnosis not present

## 2024-04-05 DIAGNOSIS — I130 Hypertensive heart and chronic kidney disease with heart failure and stage 1 through stage 4 chronic kidney disease, or unspecified chronic kidney disease: Secondary | ICD-10-CM | POA: Diagnosis not present

## 2024-04-05 DIAGNOSIS — I5043 Acute on chronic combined systolic (congestive) and diastolic (congestive) heart failure: Secondary | ICD-10-CM | POA: Diagnosis not present

## 2024-04-05 DIAGNOSIS — N184 Chronic kidney disease, stage 4 (severe): Secondary | ICD-10-CM | POA: Diagnosis not present

## 2024-04-05 DIAGNOSIS — M1990 Unspecified osteoarthritis, unspecified site: Secondary | ICD-10-CM | POA: Diagnosis not present

## 2024-04-05 DIAGNOSIS — M109 Gout, unspecified: Secondary | ICD-10-CM | POA: Diagnosis not present

## 2024-04-05 DIAGNOSIS — E1122 Type 2 diabetes mellitus with diabetic chronic kidney disease: Secondary | ICD-10-CM | POA: Diagnosis not present

## 2024-04-05 DIAGNOSIS — J9621 Acute and chronic respiratory failure with hypoxia: Secondary | ICD-10-CM | POA: Diagnosis not present

## 2024-04-05 DIAGNOSIS — I480 Paroxysmal atrial fibrillation: Secondary | ICD-10-CM | POA: Diagnosis not present

## 2024-04-05 DIAGNOSIS — E1151 Type 2 diabetes mellitus with diabetic peripheral angiopathy without gangrene: Secondary | ICD-10-CM | POA: Diagnosis not present

## 2024-04-05 DIAGNOSIS — F419 Anxiety disorder, unspecified: Secondary | ICD-10-CM | POA: Diagnosis not present

## 2024-04-05 DIAGNOSIS — K219 Gastro-esophageal reflux disease without esophagitis: Secondary | ICD-10-CM | POA: Diagnosis not present

## 2024-04-05 NOTE — Telephone Encounter (Signed)
Patient calling back for update. Please advise  

## 2024-04-05 NOTE — Telephone Encounter (Signed)
 Spoke with patient and spouse Ryan [facility]) and relayed Dr. Ranee advice that patient stay on eliquis  2.5 mg BID. Ryan states they have some 5 mg doses on hand, which they can cut in half and take twice a day. They are asking about patient assistance programs for this medication and for samples to tide them over until they can potentially get patient assistance. Advised that I would look into getting them a patient assistance application and into whether 2.5 mg samples are available. Patient requests a call back on Monday in regards to picking samples and a patient assistance application.

## 2024-04-05 NOTE — Telephone Encounter (Signed)
 Patient calling the office for samples of medication:   1.  What medication and dosage are you requesting samples for? apixaban  (ELIQUIS ) 2.5 MG TABS tablet   2.  Are you currently out of this medication? Yes   Pt c/o medication issue:  1. Name of Medication: apixaban  (ELIQUIS ) 2.5 MG TABS tablet   2. How are you currently taking this medication (dosage and times per day)? As written  3. Are you having a reaction (difficulty breathing--STAT)? no  4. What is your medication issue? Pt would like to know if there is another medication that could be prescribed similar to above medication being that out of pocket cost is too expensive

## 2024-04-08 ENCOUNTER — Telehealth: Payer: Self-pay | Admitting: Pharmacy Technician

## 2024-04-08 ENCOUNTER — Other Ambulatory Visit (HOSPITAL_COMMUNITY): Payer: Self-pay

## 2024-04-08 ENCOUNTER — Telehealth: Payer: Self-pay | Admitting: Cardiovascular Disease

## 2024-04-08 MED ORDER — APIXABAN 2.5 MG PO TABS
2.5000 mg | ORAL_TABLET | Freq: Two times a day (BID) | ORAL | Status: DC
Start: 2024-04-08 — End: 2024-04-18

## 2024-04-08 NOTE — Telephone Encounter (Signed)
 Called pt, spoke with her husband. Aware the samples are ready. He will come pick them up tomorrow.

## 2024-04-08 NOTE — Addendum Note (Signed)
 Addended by: MELIDA ROLIN HERO on: 04/08/2024 04:58 PM   Modules accepted: Orders

## 2024-04-08 NOTE — Telephone Encounter (Signed)
     Patient Advocate Encounter   The patient was approved for a [facility] grant that will help cover the cost of eliquis /farxiga  Total amount awarded, 7500.  Effective: 03/09/24 - 03/08/25   APW:[id number] ERW:[id number] Hmnle:[id number] PI:[id number] [employer] ID: [id number]   Pharmacy provided with approval and processing information. Patient informed via mychart

## 2024-04-08 NOTE — Telephone Encounter (Signed)
 Spoke with pt's husband regarding Eliquis . Husband stated that he went to pick up a refill of Eliquis  from the pharmacy and it was $162. Husband decided to wait before purchasing the medication to see if he could get samples from us . Pt was told his request would be sent to our anticoagulation pool for assistance. Husband asked if the pt could take two of the expired pills. Husband was advised not to use any of the expired medications. Husband verbalized understanding. All questions if any were answered.

## 2024-04-08 NOTE — Telephone Encounter (Signed)
 Pt c/o medication issue:  1. Name of Medication:   apixaban  (ELIQUIS ) 2.5 MG TABS tablet   2. How are you currently taking this medication (dosage and times per day)?   Not taking yet  3. Are you having a reaction (difficulty breathing--STAT)?   4. What is your medication issue?   Husband Artie) wants to get samples of this medication to last until patient has appointment on 10/20.  Husband stated they have an old prescription with expiration date 02/2024 and wants to know if they can use that medication.

## 2024-04-10 ENCOUNTER — Observation Stay (HOSPITAL_COMMUNITY)
Admission: EM | Admit: 2024-04-10 | Discharge: 2024-04-18 | Disposition: A | Discharge status: Skilled Nursing Facility | Attending: Internal Medicine | Admitting: Internal Medicine

## 2024-04-10 ENCOUNTER — Emergency Department (HOSPITAL_COMMUNITY)

## 2024-04-10 ENCOUNTER — Encounter (HOSPITAL_COMMUNITY): Payer: Self-pay

## 2024-04-10 ENCOUNTER — Other Ambulatory Visit: Payer: Self-pay

## 2024-04-10 DIAGNOSIS — I2694 Multiple subsegmental pulmonary emboli without acute cor pulmonale: Secondary | ICD-10-CM | POA: Diagnosis not present

## 2024-04-10 DIAGNOSIS — I739 Peripheral vascular disease, unspecified: Secondary | ICD-10-CM | POA: Diagnosis not present

## 2024-04-10 DIAGNOSIS — Z7901 Long term (current) use of anticoagulants: Secondary | ICD-10-CM | POA: Insufficient documentation

## 2024-04-10 DIAGNOSIS — M170 Bilateral primary osteoarthritis of knee: Secondary | ICD-10-CM | POA: Diagnosis not present

## 2024-04-10 DIAGNOSIS — E1122 Type 2 diabetes mellitus with diabetic chronic kidney disease: Secondary | ICD-10-CM | POA: Diagnosis not present

## 2024-04-10 DIAGNOSIS — E785 Hyperlipidemia, unspecified: Secondary | ICD-10-CM | POA: Diagnosis not present

## 2024-04-10 DIAGNOSIS — Z8719 Personal history of other diseases of the digestive system: Secondary | ICD-10-CM | POA: Diagnosis not present

## 2024-04-10 DIAGNOSIS — Z8739 Personal history of other diseases of the musculoskeletal system and connective tissue: Secondary | ICD-10-CM | POA: Insufficient documentation

## 2024-04-10 DIAGNOSIS — E66813 Obesity, class 3: Secondary | ICD-10-CM | POA: Diagnosis not present

## 2024-04-10 DIAGNOSIS — D631 Anemia in chronic kidney disease: Secondary | ICD-10-CM | POA: Insufficient documentation

## 2024-04-10 DIAGNOSIS — I517 Cardiomegaly: Secondary | ICD-10-CM | POA: Diagnosis not present

## 2024-04-10 DIAGNOSIS — N184 Chronic kidney disease, stage 4 (severe): Secondary | ICD-10-CM | POA: Diagnosis not present

## 2024-04-10 DIAGNOSIS — I130 Hypertensive heart and chronic kidney disease with heart failure and stage 1 through stage 4 chronic kidney disease, or unspecified chronic kidney disease: Secondary | ICD-10-CM | POA: Insufficient documentation

## 2024-04-10 DIAGNOSIS — Z86718 Personal history of other venous thrombosis and embolism: Secondary | ICD-10-CM | POA: Diagnosis not present

## 2024-04-10 DIAGNOSIS — R2689 Other abnormalities of gait and mobility: Secondary | ICD-10-CM | POA: Diagnosis not present

## 2024-04-10 DIAGNOSIS — R29898 Other symptoms and signs involving the musculoskeletal system: Principal | ICD-10-CM | POA: Insufficient documentation

## 2024-04-10 DIAGNOSIS — M6281 Muscle weakness (generalized): Secondary | ICD-10-CM | POA: Diagnosis not present

## 2024-04-10 DIAGNOSIS — Z79899 Other long term (current) drug therapy: Secondary | ICD-10-CM | POA: Diagnosis not present

## 2024-04-10 DIAGNOSIS — Z23 Encounter for immunization: Secondary | ICD-10-CM | POA: Insufficient documentation

## 2024-04-10 DIAGNOSIS — Z87891 Personal history of nicotine dependence: Secondary | ICD-10-CM | POA: Diagnosis not present

## 2024-04-10 DIAGNOSIS — I5042 Chronic combined systolic (congestive) and diastolic (congestive) heart failure: Secondary | ICD-10-CM | POA: Diagnosis not present

## 2024-04-10 DIAGNOSIS — I872 Venous insufficiency (chronic) (peripheral): Secondary | ICD-10-CM | POA: Insufficient documentation

## 2024-04-10 DIAGNOSIS — Z6841 Body Mass Index (BMI) 40.0 and over, adult: Secondary | ICD-10-CM | POA: Diagnosis not present

## 2024-04-10 DIAGNOSIS — E1169 Type 2 diabetes mellitus with other specified complication: Secondary | ICD-10-CM

## 2024-04-10 DIAGNOSIS — R531 Weakness: Secondary | ICD-10-CM | POA: Diagnosis not present

## 2024-04-10 DIAGNOSIS — R29818 Other symptoms and signs involving the nervous system: Secondary | ICD-10-CM | POA: Diagnosis not present

## 2024-04-10 DIAGNOSIS — I480 Paroxysmal atrial fibrillation: Secondary | ICD-10-CM | POA: Diagnosis not present

## 2024-04-10 DIAGNOSIS — R0989 Other specified symptoms and signs involving the circulatory and respiratory systems: Secondary | ICD-10-CM | POA: Diagnosis not present

## 2024-04-10 DIAGNOSIS — J9811 Atelectasis: Secondary | ICD-10-CM | POA: Diagnosis not present

## 2024-04-10 DIAGNOSIS — N183 Chronic kidney disease, stage 3 unspecified: Secondary | ICD-10-CM | POA: Diagnosis present

## 2024-04-10 DIAGNOSIS — R770 Abnormality of albumin: Secondary | ICD-10-CM | POA: Diagnosis not present

## 2024-04-10 DIAGNOSIS — R0902 Hypoxemia: Secondary | ICD-10-CM | POA: Diagnosis not present

## 2024-04-10 DIAGNOSIS — I6523 Occlusion and stenosis of bilateral carotid arteries: Secondary | ICD-10-CM | POA: Diagnosis not present

## 2024-04-10 DIAGNOSIS — E119 Type 2 diabetes mellitus without complications: Secondary | ICD-10-CM

## 2024-04-10 DIAGNOSIS — R7989 Other specified abnormal findings of blood chemistry: Secondary | ICD-10-CM | POA: Diagnosis not present

## 2024-04-10 DIAGNOSIS — I10 Essential (primary) hypertension: Secondary | ICD-10-CM | POA: Diagnosis present

## 2024-04-10 DIAGNOSIS — I2699 Other pulmonary embolism without acute cor pulmonale: Secondary | ICD-10-CM | POA: Diagnosis present

## 2024-04-10 LAB — COMPREHENSIVE METABOLIC PANEL WITH GFR
ALT: 14 U/L (ref 0–44)
AST: 19 U/L (ref 15–41)
Albumin: 3.5 g/dL (ref 3.5–5.0)
Alkaline Phosphatase: 65 U/L (ref 38–126)
Anion gap: 10 (ref 5–15)
BUN: 36 mg/dL — ABNORMAL HIGH (ref 8–23)
CO2: 27 mmol/L (ref 22–32)
Calcium: 9.1 mg/dL (ref 8.9–10.3)
Chloride: 103 mmol/L (ref 98–111)
Creatinine, Ser: 1.82 mg/dL — ABNORMAL HIGH (ref 0.44–1.00)
GFR, Estimated: 26 mL/min — ABNORMAL LOW (ref >60)
Glucose, Bld: 86 mg/dL (ref 70–99)
Potassium: 3.9 mmol/L (ref 3.5–5.1)
Sodium: 140 mmol/L (ref 135–145)
Total Bilirubin: 0.6 mg/dL (ref 0.0–1.2)
Total Protein: 6.2 g/dL — ABNORMAL LOW (ref 6.5–8.1)

## 2024-04-10 LAB — CBG MONITORING, ED: Glucose-Capillary: 94 mg/dL (ref 70–99)

## 2024-04-10 LAB — CBC WITH DIFFERENTIAL/PLATELET
Abs Immature Granulocytes: 0.13 K/uL — ABNORMAL HIGH (ref 0.00–0.07)
Basophils Absolute: 0.1 K/uL (ref 0.0–0.1)
Basophils Relative: 1 %
Eosinophils Absolute: 0.2 K/uL (ref 0.0–0.5)
Eosinophils Relative: 2 %
HCT: 31.8 % — ABNORMAL LOW (ref 36.0–46.0)
Hemoglobin: 9.7 g/dL — ABNORMAL LOW (ref 12.0–15.0)
Immature Granulocytes: 1 %
Lymphocytes Relative: 12 %
Lymphs Abs: 1.6 K/uL (ref 0.7–4.0)
MCH: 30 pg (ref 26.0–34.0)
MCHC: 30.5 g/dL (ref 30.0–36.0)
MCV: 98.5 fL (ref 80.0–100.0)
Monocytes Absolute: 0.9 K/uL (ref 0.1–1.0)
Monocytes Relative: 7 %
Neutro Abs: 9.9 K/uL — ABNORMAL HIGH (ref 1.7–7.7)
Neutrophils Relative %: 77 %
Platelets: 263 K/uL (ref 150–400)
RBC: 3.23 MIL/uL — ABNORMAL LOW (ref 3.87–5.11)
RDW: 14.9 % (ref 11.5–15.5)
WBC: 12.8 K/uL — ABNORMAL HIGH (ref 4.0–10.5)
nRBC: 0 % (ref 0.0–0.2)

## 2024-04-10 LAB — PRO BRAIN NATRIURETIC PEPTIDE: Pro Brain Natriuretic Peptide: 953 pg/mL — ABNORMAL HIGH (ref <300.0)

## 2024-04-10 LAB — MAGNESIUM: Magnesium: 2.4 mg/dL (ref 1.7–2.4)

## 2024-04-10 LAB — TROPONIN T, HIGH SENSITIVITY: Troponin T High Sensitivity: 37 ng/L — ABNORMAL HIGH (ref 0–19)

## 2024-04-10 LAB — VITAMIN B12: Vitamin B-12: 647 pg/mL (ref 180–914)

## 2024-04-10 LAB — TSH: TSH: 2 u[IU]/mL (ref 0.350–4.500)

## 2024-04-10 MED ORDER — DAPAGLIFLOZIN PROPANEDIOL 10 MG PO TABS
10.0000 mg | ORAL_TABLET | Freq: Every day | ORAL | Status: DC
  Administered 2024-04-11 – 2024-04-18 (×8): 10 mg via ORAL
  Filled 2024-04-10 (×8): qty 1

## 2024-04-10 MED ORDER — ACETAMINOPHEN 650 MG RE SUPP: 650.0000 mg | Freq: Four times a day (QID) | RECTAL | Status: DC | PRN

## 2024-04-10 MED ORDER — PANTOPRAZOLE SODIUM 40 MG PO TBEC
40.0000 mg | DELAYED_RELEASE_TABLET | Freq: Two times a day (BID) | ORAL | Status: DC
  Administered 2024-04-10 – 2024-04-14 (×9): 40 mg via ORAL
  Filled 2024-04-10 (×10): qty 1

## 2024-04-10 MED ORDER — MUSCLE RUB 10-15 % EX CREA: TOPICAL_CREAM | CUTANEOUS | Status: DC | PRN

## 2024-04-10 MED ORDER — SENNOSIDES-DOCUSATE SODIUM 8.6-50 MG PO TABS: 1.0000 | ORAL_TABLET | Freq: Every evening | ORAL | Status: DC | PRN

## 2024-04-10 MED ORDER — FERROUS SULFATE 325 (65 FE) MG PO TABS
325.0000 mg | ORAL_TABLET | Freq: Every day | ORAL | Status: DC
  Administered 2024-04-11 – 2024-04-18 (×8): 325 mg via ORAL
  Filled 2024-04-10 (×8): qty 1

## 2024-04-10 MED ORDER — ACETAMINOPHEN 325 MG PO TABS: 650.0000 mg | ORAL_TABLET | Freq: Four times a day (QID) | ORAL | Status: DC | PRN

## 2024-04-10 MED ORDER — LORAZEPAM 2 MG/ML IJ SOLN
0.5000 mg | Freq: Once | INTRAMUSCULAR | Status: AC
  Administered 2024-04-10: 0.5 mg via INTRAVENOUS
  Filled 2024-04-10: qty 1

## 2024-04-10 MED ORDER — TRAZODONE HCL 100 MG PO TABS
100.0000 mg | ORAL_TABLET | Freq: Every evening | ORAL | Status: DC | PRN
  Administered 2024-04-16 – 2024-04-17 (×2): 100 mg via ORAL
  Filled 2024-04-10 (×3): qty 1

## 2024-04-10 MED ORDER — APIXABAN 2.5 MG PO TABS
2.5000 mg | ORAL_TABLET | Freq: Two times a day (BID) | ORAL | Status: DC
  Administered 2024-04-10 – 2024-04-18 (×16): 2.5 mg via ORAL
  Filled 2024-04-10 (×16): qty 1

## 2024-04-10 MED ORDER — METOPROLOL SUCCINATE ER 25 MG PO TB24
25.0000 mg | ORAL_TABLET | Freq: Every day | ORAL | Status: DC
Start: 2024-04-10 — End: 2024-04-18
  Administered 2024-04-10 – 2024-04-18 (×9): 25 mg via ORAL
  Filled 2024-04-10 (×9): qty 1

## 2024-04-10 MED ORDER — FUROSEMIDE 10 MG/ML IJ SOLN
20.0000 mg | Freq: Once | INTRAMUSCULAR | Status: AC
  Administered 2024-04-10: 20 mg via INTRAVENOUS
  Filled 2024-04-10: qty 4

## 2024-04-10 MED ORDER — RISAQUAD PO CAPS
1.0000 | ORAL_CAPSULE | Freq: Every day | ORAL | Status: DC
  Administered 2024-04-11 – 2024-04-18 (×8): 1 via ORAL
  Filled 2024-04-10 (×8): qty 1

## 2024-04-10 MED ORDER — PRAVASTATIN SODIUM 20 MG PO TABS
10.0000 mg | ORAL_TABLET | Freq: Every day | ORAL | Status: DC
  Administered 2024-04-11 – 2024-04-17 (×7): 10 mg via ORAL
  Filled 2024-04-10 (×8): qty 1

## 2024-04-10 MED ORDER — INSULIN ASPART 100 UNIT/ML IJ SOLN
0.0000 [IU] | Freq: Every day | INTRAMUSCULAR | Status: DC
  Filled 2024-04-10: qty 0.05

## 2024-04-10 MED ORDER — HYDROCODONE-ACETAMINOPHEN 5-325 MG PO TABS
1.0000 | ORAL_TABLET | Freq: Four times a day (QID) | ORAL | Status: DC | PRN
  Administered 2024-04-11 – 2024-04-18 (×10): 1 via ORAL
  Filled 2024-04-10 (×10): qty 1

## 2024-04-10 MED ORDER — CALCIUM CARBONATE 1250 (500 CA) MG PO TABS
1250.0000 mg | ORAL_TABLET | Freq: Every day | ORAL | Status: DC
  Administered 2024-04-11 – 2024-04-18 (×8): 1250 mg via ORAL
  Filled 2024-04-10 (×8): qty 1

## 2024-04-10 MED ORDER — INSULIN ASPART 100 UNIT/ML IJ SOLN
0.0000 [IU] | Freq: Three times a day (TID) | INTRAMUSCULAR | Status: DC
  Administered 2024-04-12 – 2024-04-16 (×6): 1 [IU] via SUBCUTANEOUS
  Administered 2024-04-17: 2 [IU] via SUBCUTANEOUS
  Filled 2024-04-10: qty 0.09

## 2024-04-10 MED ORDER — ALIGN 4 MG PO CAPS: 4.0000 mg | ORAL_CAPSULE | Freq: Every day | ORAL | Status: DC

## 2024-04-10 MED ORDER — ALLOPURINOL 100 MG PO TABS
100.0000 mg | ORAL_TABLET | Freq: Every day | ORAL | Status: DC
  Administered 2024-04-11 – 2024-04-18 (×8): 100 mg via ORAL
  Filled 2024-04-10 (×8): qty 1

## 2024-04-10 MED ORDER — FUROSEMIDE 20 MG PO TABS
20.0000 mg | ORAL_TABLET | Freq: Every day | ORAL | Status: DC
  Administered 2024-04-11 – 2024-04-14 (×4): 20 mg via ORAL
  Filled 2024-04-10 (×4): qty 1

## 2024-04-10 MED ORDER — ALPRAZOLAM 0.25 MG PO TABS
0.2500 mg | ORAL_TABLET | Freq: Two times a day (BID) | ORAL | Status: DC | PRN
  Administered 2024-04-11 – 2024-04-18 (×13): 0.25 mg via ORAL
  Filled 2024-04-10 (×13): qty 1

## 2024-04-10 NOTE — Hospital Course (Addendum)
 The patient is an elderly 89 y.o. female past medical history of diabetes mellitus type 2, chronic kidney stage IV, anemia of chronic disease, A-fib PE on Eliquis , chronic diastolic heart failure with a last EF of 60%, she was recently discharged on 04/04/2024 for acute decompensated heart failure, comes into the ED with leg weakness and difficulty getting up for 2 weeks. Worked up and now stabilized for SNF and now has an Engineer, petroleum.  Assessment and Plan:  Ambulatory Dysfunction Bilateral Lower Extremity weakness  Physical Deconditioning and Debility History of gout Severe arthritis compartmental osteoarthritis b/l knees: Since her fall in August 23 she has has not walked but able to stand only and not able to stand up today- likley multifactorial but  ruling out other etiology- she is able to bend her knees b/l intact sensation and no incontinence. Underwent workup w/ B12, RPR, TSH UA, MRI brain along with C,L,T Canceled after Dr. Odell Castor discussed with the patient and after patient declined since she had no facial droop or asymmetric weakness.  -Neuro Consulted and recommending Thiamine 100 mg po Daily and Rehab -She does have osteoarthritis, history of gout, recently treated for gout flare-with Prednisone  and colchicine-and started on allopurinol.  She gets knee injections for arthritis as well. -PT/OT recommending SNF -Head CT showed now acute Abnormalities -C/w muscle rub, resume home norco.  Combined systolic and diastolic CHF: BNP slight elevated from baseline however x-ray clear.  Not hypoxic. C/w Dapagliflozin  10 mg po Daily, Furosemide  20 mg po Daily, and Metoprolol  Succinate 25 mg po Daily.  Holding her Losartan  25 mg p.o. daily given her renal dysfunction.  Titrate Medications as needed  Essential HTN: C/w Furosemide  20 mg po Daily, and Metoprolol  Succinate 25 mg po Daily.  Hold her Losartan  25 mg p.o. daily given her renal dysfunction CTM BP per  Protocol. Last BP reading was 127/52  T2DM: C/w Sensitive Novolog  SSI AC/HS and Dapagliflozin  10 mg po Daily. CBG Trend:  Recent Labs  Lab 04/16/24 1646 04/16/24 2111 04/17/24 0742 04/17/24 1252 04/17/24 1543 04/17/24 2114 04/18/24 0804  GLUCAP 131* 106* 98 158* 111* 108* 109*   CKD stage IV: Renal function appears to be at baseline. BUN/Cr Trend: Recent Labs  Lab 04/02/24 0602 04/03/24 0502 04/04/24 0509 04/10/24 1520 04/11/24 0452 04/14/24 0432 04/17/24 1101  BUN 85* 89* 87* 36* 32* 32* 37*  CREATININE 2.02* 1.90* 1.77* 1.82* 1.81* 1.96* 2.35*  -C/w Home Furosemide  20 mg po Daily AND C/w Calcium  Carbonate 1250 mg po Daily; hold your Losartan  25 mg p.o. daily -Avoid Nephrotoxic Medications, Contrast Dyes, Hypotension and Dehydration to Ensure Adequate Renal Perfusion and will need to Renally Adjust Meds -Continue to Monitor and Trend Renal Function carefully and repeat CMP w/in 1 week  Anemia of Chronic Renal Disease: Hemoglobin stable. Hgb/Hct Trend:  Recent Labs  Lab 04/01/24 0510 04/02/24 0602 04/03/24 0502 04/04/24 0509 04/10/24 1520 04/11/24 0452 04/17/24 1101  HGB 9.6* 9.7* 9.1* 9.7* 9.7* 10.1* 11.1*  HCT 29.7* 30.2* 28.5* 30.8* 31.8* 32.3* 36.3  MCV  --  96.5 96.3 97.8 98.5 98.2 99.2  -C/w Ferrous Sulfate 325 mg po Daily  -Check Anemia Panel in the outpatient setting. CTM for S/Sx of Bleeding; No overt bleeding noted. Repeat CBC w/in 1 week and follow up w/ PCP w/in 1 week  Paroxysmal A-fib / History of PE: C/w Apixaban  2.5 mg po BID.  Venous stasis dermatitis: Continue leg/wound care  PAD / CAD/calcification on CT chest / Positive  troponin 35: susepct demand ischemia - was present on last admit: Patient was evaluated by cardiology and vascular last admission-no further intervention. No chest pain currently. CTM   Hx of GI bleed on last admission needing EGD: C/w Pantoprazole  40 mg po Daily  HLD: C/w Pravastatin  10 mg po qHS  Hypoalbuminemia: Patient's  Albumin  Trend:  Recent Labs  Lab 03/29/24 0111 03/30/24 0529 03/31/24 0500 04/01/24 0007 04/02/24 0602 04/10/24 1520 04/17/24 1101  ALBUMIN  3.6 3.4* 3.2* 3.0* 3.0* 3.5 3.0*  -CTM and Trend and repeat CMP in the outpatient setting  Class III (Morbid) Obesity: Complicates overall prognosis and care. Estimated body mass index is 42.96 kg/m as calculated from the following:   Height as of this encounter: 5' 2 (1.575 m).   Weight as of this encounter: 106.5 kg. Weight Loss and Dietary Counseling given

## 2024-04-10 NOTE — ED Provider Notes (Signed)
 [place] EMERGENCY DEPARTMENT AT [facility] Provider Note   CSN: [id number] Arrival date & time: 04/10/24  1436     Patient presents with: Extremity Weakness   Pamela [name] is a 89 y.o. female.  {Add pertinent medical, surgical, social history, OB history to HPI:32947} HPI     89 year old female with a history of type 2 diabetes, CKD stage IV, anemia of chronic disease, atrial fibrillation, PE on eliquis , combined systolic and diastolic heart failure recent discharge from the hospital October 2 after she had presented with acute hypoxic respiratory failure who presents with concern for weakness.   Since the fall Aug 23rd has not been able to walk Legs wobbling, collapsing, generally weak, totally immobile now not able to walk, staying in recliner chair Before the fall was able to walk around without a problem with the walker in the house, just had bruises but didn't think she broke anything.  Could stand but not walk.  Did go see Dr. Nanci after the fall, was able to move things around but not walk. Did not hit head when she fell, legs feel like they are so heavy that she can't lift them, legs shake.  Got her to stand a few times while in the hospital, PT came to the house once, able to get her up with him helping her Monday Fear of falling   No chest pain or dyspnea No fevers  No loss control of bowel or bladder No nausea or vomiting No diarrhea now , did in the hospital  Past Medical History:  Diagnosis Date   Arthritis    Bilateral lower extremity edema    left lower extremity cellulitis   Chronic heart failure with mildly reduced ejection fraction (HFmrEF, 41-49%) (HCC)    Common bile duct stone 06/22/2017   Diabetes (HCC)    Diverticulosis    Duodenal diverticulum, periampullary 11/03/2018   Aborted ERCP 2016   GERD (gastroesophageal reflux disease)    History of hiatal hernia    Hypertension    Kidney disease, chronic, stage III (GFR 30-59  ml/min) (HCC)    Obesity    Osteopenia    PAF (paroxysmal atrial fibrillation) (HCC)    Seizures (HCC) 22 yrs ago   no cause found   Tubular adenoma of colon 2009    Past Surgical History:  Procedure Laterality Date   ABDOMINAL HYSTERECTOMY     APPENDECTOMY     CHOLECYSTECTOMY OPEN  1977   COLONOSCOPY WITH PROPOFOL  N/A 10/16/2014   Procedure: COLONOSCOPY WITH PROPOFOL ;  Surgeon: Gwendlyn ONEIDA Buddy, MD;  Location: [facility];  Service: Endoscopy;  Laterality: N/A;   ENDOSCOPIC RETROGRADE CHOLANGIOPANCREATOGRAPHY (ERCP) WITH PROPOFOL  N/A 06/02/2017   Procedure: ENDOSCOPIC RETROGRADE CHOLANGIOPANCREATOGRAPHY (ERCP) WITH PROPOFOL ;  Surgeon: Buddy Gwendlyn ONEIDA, MD;  Location: [facility] ENDOSCOPY;  Service: Endoscopy;  Laterality: N/A;   ESOPHAGOGASTRODUODENOSCOPY N/A 04/01/2024   Procedure: EGD (ESOPHAGOGASTRODUODENOSCOPY);  Surgeon: Suzann Inocente CHRISTELLA, MD;  Location: [facility];  Service: Gastroenterology;  Laterality: N/A;   TONSILLECTOMY      Prior to Admission medications   Medication Sig Start Date End Date Taking? Authorizing Provider  acetaminophen  (TYLENOL  8 HOUR ARTHRITIS PAIN) 650 MG CR tablet Take 1,300 mg by mouth in the morning.    [provider]  allopurinol (ZYLOPRIM) 100 MG tablet Take 1 tablet (100 mg total) by mouth daily. 04/04/24   Regalado, Belkys A, MD  ALPRAZolam  (XANAX ) 0.25 MG tablet Take 0.25 mg by mouth 2 (two) times daily as needed  for anxiety. 01/13/22   [provider]  apixaban  (ELIQUIS ) 2.5 MG TABS tablet Take 1 tablet (2.5 mg total) by mouth 2 (two) times daily. 04/04/24   Regalado, Belkys A, MD  apixaban  (ELIQUIS ) 2.5 MG TABS tablet Take 1 tablet (2.5 mg total) by mouth 2 (two) times daily. 04/08/24   Court Dorn PARAS, MD  calcium  carbonate (OSCAL) 1500 (600 Ca) MG TABS tablet Take 1,500 mg by mouth daily with breakfast.    [provider]  dapagliflozin  propanediol (FARXIGA ) 10 MG TABS tablet Take 10 mg by mouth daily.    [provider]  diclofenac  Sodium (VOLTAREN ) 1 % GEL Apply 2 g topically 4 (four) times daily. Patient taking differently: Apply 2 g topically 4 (four) times daily as needed (for pain). 01/03/22   Sheikh, Omair Latif, DO  ferrous sulfate 325 (65 FE) MG EC tablet Take 1 tablet (325 mg total) by mouth daily with breakfast. 04/04/24 05/04/24  Regalado, Owen A, MD  furosemide  (LASIX ) 20 MG tablet Take 1 tablet (20 mg total) by mouth daily. 04/04/24   Regalado, Belkys A, MD  HYDROcodone -acetaminophen  (NORCO/VICODIN) 5-325 MG tablet Take 1 tablet by mouth See admin instructions. Take 1 tablet by mouth every 6-8 hours as needed for pain    [provider]  Lancets (ONETOUCH DELICA PLUS LANCET30G) MISC  07/09/21   [provider]  lovastatin (MEVACOR) 20 MG tablet Take 20 mg by mouth at bedtime.    [provider]  metoprolol  succinate (TOPROL -XL) 25 MG 24 hr tablet Take 1 tablet (25 mg total) by mouth daily. Take with or immediately following a meal. 04/04/24   Regalado, Belkys A, MD  Multiple Vitamin (MULTIVITAMIN WITH MINERALS) TABS tablet Take 1 tablet by mouth daily. 04/04/24   Regalado, Belkys A, MD  OVER THE COUNTER MEDICATION Take 1 capsule by mouth daily. Daily fiber 100% psyllium- Take 1 capsule by mouth once a day    [provider]  pantoprazole  (PROTONIX ) 40 MG tablet Take 1 tablet (40 mg total) by mouth 2 (two) times daily. 04/04/24 05/04/24  Regalado, Owen A, MD  Probiotic Product (ALIGN) 4 MG CAPS Take 4 mg by mouth daily.    [provider]  traZODone  (DESYREL ) 50 MG tablet Take 100 mg by mouth at bedtime as needed for sleep.    [provider]    Allergies: Levaquin [levofloxacin in d5w], Levofloxacin, Ensure, Lisinopril, and Oxycodone     Review of Systems  Updated Vital Signs There were no vitals taken for this visit.  Physical Exam Vitals and nursing note reviewed.  Constitutional:      General: She is not in acute distress.    Appearance: She  is well-developed. She is not diaphoretic.  HENT:     Head: Normocephalic and atraumatic.  Eyes:     Conjunctiva/sclera: Conjunctivae normal.  Cardiovascular:     Rate and Rhythm: Normal rate and regular rhythm.     Heart sounds: Normal heart sounds. No murmur heard.    No friction rub. No gallop.  Pulmonary:     Effort: Pulmonary effort is normal. No respiratory distress.     Breath sounds: Normal breath sounds. No wheezing or rales.  Abdominal:     General: There is no distension.     Palpations: Abdomen is soft.     Tenderness: There is no abdominal tenderness. There is no guarding.  Musculoskeletal:        General: No tenderness.     Cervical  back: Normal range of motion.  Skin:    General: Skin is warm and dry.     Findings: No erythema or rash.  Neurological:     Mental Status: She is alert and oriented to person, place, and time.     Sensory: No sensory deficit.     Motor: Weakness (lower extremity right greater than left, 3- right, 3+left LE, weakness with all movements if bilateral LE, proximal hip worse) present.     Deep Tendon Reflexes: Abnormal reflex: reflexes difficult to obtain given pain with palpations around knees.     (all labs ordered are listed, but only abnormal results are displayed) Labs Reviewed - No data to display  EKG: None  Radiology: No results found.  {Document cardiac monitor, telemetry assessment procedure when appropriate:32947} Procedures   Medications Ordered in the ED - No data to display    {Click here for ABCD2, HEART and other calculators REFRESH Note before signing:1}                               89 year old female with a history of type 2 diabetes, CKD stage IV, anemia of chronic disease, atrial fibrillation, PE on eliquis , combined systolic and diastolic heart failure recent discharge from the hospital October 2 after she had presented with acute hypoxic respiratory failure who presents with concern for weakness with lower  extremity weakness beginning suddenly leading to a fall 02/24/2024 with inability to ambulate since that time, and today not being able to stand.   DDx includes CVA, spinal pathology including prior spinal infarct, disc herniation, vitamin deficiency, generalized weakness in setting of infection, CHF, arrhythmia, anemia, electrolyte abnormalities as well as deconditioning.  Given timing of symptoms beginning suddenly August without numbness and just with weaknses and worsening today I doubt GBS.      Labs completed and personally about interpreted by me show stable anemia, mild leukocytosis similar to prior.  Creatinine is similar to prior.  No clinically significant electrolyte abnormalities.  Her troponin is mildly elevated and similar to prior values.  Her proBNP is more elevated than it was at the beginning of her most recent admission.  Chest x-ray shows cardiac enlargement with mild vascular congestion, no edema or consolidation, similar to previous study at which time she was admitted for CHF exacerbation.  Prior to August was able to ambulate and may be multifactorial etiology of symptoms, given worsening today and new inability to stand will order MRI brain/c/t/l spine to evaluate for lesions as etiology of her LE weakness.  Unfortunately, we are not able to obtain MRI at this time.  WIll treat possible underlying CHF as etiology of generalized weakness with lasix , ordered other labs that are pending. CT head ordered shows ***  WIll admit for further care given new inability to stand.    {Document critical care time when appropriate  Document review of labs and clinical decision tools ie CHADS2VASC2, etc  Document your independent review of radiology images and any outside records  Document your discussion with family members, caretakers and with consultants  Document social determinants of health affecting pt's care  Document your decision making why or why not admission, treatments  were needed:32947:::1}   Final diagnoses:  None    ED Discharge Orders     None

## 2024-04-10 NOTE — H&P (Signed)
 History and Physical    Pamela [name] FMW:[id number] DOB: 12-15-1934 DOA: 04/10/2024  PCP: [name], MD   Patient coming from: Home Chief Complaint  Patient presents with   Extremity Weakness      HPI: Pamela [name] is a 89 y.o. female with PMH of T2DM, CKD stage IV, AOCD, A-fib, PE on Eliquis , combined systolic and diastolic CHF, presented to the ED with leg weakness and difficulty getting up and down for 2 weeks.  She had a fall August 23 while walking across the room and has not been able to walk  since then, in recent hospitalization has been able to stand however but has not walked. She has had weakness and wobbly gait. Recently admitted 9/25 - 10/2 acute on chronic CHF/respiratory failure, acute gout, acute GI bleed had EGD, fairly normal. She said she did well for 3 days since discharge and able to stand up and use potty next to her recliner where she has been living since her fall on August 23 and has not been able to walk. Since today she has not been able to stand and getting more weak. She does have  bone on bone arthritis on both the knee with worse on right knee for few years and getting steroid shot, last one was 3 months ago. Patient otherwise denies any nausea, vomiting,diarrhea, chest pain, shortness of breath, fever, chills, headache, focal or unilateral weakness, numbness tingling, speech difficulties   In the ED: Vitals stable afebrile, Spo2: 91-95%. Labs: Creatinine -1.8 up from 1.7 on 10/2, egfr 27, proBNP 953 ( 698 2 wks ago) troponin 37, mild leukocytosis, chronic anemia. B12, rpr, tsh, ft4 troponins, b1 ,UA-pending.  CXR>>shallow inspiration, atelectasis at bases cardiac enlargement with mild vascular congestion CT head>>NAD. MRI brain C-spine and T-spine thoracic spine ordered and not able to be done tonight and pending. No numbness in legs,no focal weakness no bowel and bladder incontinence and has weakness. She is not able to stand and likely needs  placement. Lasix  is ordered.  Assessment and plan:  Ambulatory dysfunction Lower leg weakness Deconditioning debility History of gout Severe arthritis compartmental osteoarthritis b/l knees: Since her fall in August 23 she has has not walked but able to stand only and not able to stand up today- likley multifactorial but  ruling out other etiology- she is able to bend her knees b/l intact sensation and no incontinence. Follow-up B12 RPR TSH UA, CK. MRI brain along with C,L,T pending. ED consulting neuro as well. She does have osteoarthritis, history of gout, recently treated for gout flare-with prednisone  and colchicine-and started on allopurinol.  She gets knee injections for arthritis as well. Request PT OT evaluation, add muscle rub, resume home norco. If work up negative may benefit with ortho input/steroid injection?. She likely need placements but she does not want to go to SNF.  Combined systolic and diastolic CHF: BNP slight elevated from baseline however x-ray clear.  Not hypoxic.  Will resume home diuretics  T2DM: Add sliding scale insulin   CKD stage IV: Renal function appears to be at baseline.  Continue home diuretics  AOCD Hemoglobin stable.  GI bleed on last admission needing EGD unremarkable Continue her PPI  Paroxysmal A-fib History of PE PE: on Eliquis -continue same, renal dose.  Venous stasis dermatitis: Continue leg/wound care  PAD CAD/calcification on CT chest Positive troponin 35- susepct demand ischemia - was present on last admit: Patient was evaluated by cardiology and vascular last admission-no further intervention. No chest pain currently.  Class II Obesity: Will benefit with PCP follow-up, weight loss,healthy lifestyle   Mobility: ordered **. PT Orders:  PT Follow up Rec:    DVT prophylaxis: apixaban  (ELIQUIS ) tablet 2.5 mg Start: 04/10/24 2200 Code Status:   Code Status: Limited: Do not attempt resuscitation (DNR) -DNR-LIMITED -Do Not  Intubate/DNI  Family Communication: plan of care discussed with patient/Husband at bedside. Patient status is: Remains hospitalized because of severity of illness Level of care: Telemetry   Dispo: The patient is from: home            Anticipated disposition: TBD Objective: Vitals last 24 hrs: Vitals:   04/10/24 1556 04/10/24 1900 04/10/24 1942  BP: (!) 152/65 (!) 171/85 (!) 154/67  Pulse: 82  68  Resp: 14 (!) 25 20  Temp: 97.9 F (36.6 C)  97.9 F (36.6 C)  TempSrc: Oral  Oral  SpO2: 95%  91%    Physical Examination: General exam: alert awake, oriented, obese, on RA  HEENT:Oral mucosa moist, Ear/Nose WNL grossly Respiratory system: Bilaterally clear BS,no use of accessory muscle Cardiovascular system: S1 & S2 +, No JVD. Gastrointestinal system: Abdomen soft,NT,ND, BS+ Nervous System: Alert, awake, moving UE wll, able ot bend b/l knee and dorsiflex ankle well, sensation intact Extremities: extremities warm, leg edema+ better from before with erythema venous statis, Skin: No rashes,no icterus. MSK: Normal muscle bulk,tone, power   Medications reviewed:  Scheduled Meds:  Align  4 mg Oral Daily   allopurinol  100 mg Oral Daily   apixaban   2.5 mg Oral BID   [START ON 04/11/2024] calcium  carbonate  1,500 mg Oral Q breakfast   dapagliflozin  propanediol  10 mg Oral Daily   [START ON 04/11/2024] ferrous sulfate  325 mg Oral Q breakfast   [START ON 04/11/2024] furosemide   20 mg Oral Daily   insulin  aspart  0-5 Units Subcutaneous QHS   [START ON 04/11/2024] insulin  aspart  0-9 Units Subcutaneous TID WC   metoprolol  succinate  25 mg Oral Daily   pantoprazole   40 mg Oral BID   pravastatin   10 mg Oral QHS   Continuous Infusions: Diet: Diet Order             Diet Carb Modified Fluid consistency: Thin; Room service appropriate? Yes; Fluid restriction: 1500 mL Fluid  Diet effective now                      Severity of Illness: The appropriate patient status for this patient  is OBSERVATION. Observation status is judged to be reasonable and necessary in order to provide the required intensity of service to ensure the patient's safety. The patient's presenting symptoms, physical exam findings, and initial radiographic and laboratory data in the context of their medical condition is felt to place them at decreased risk for further clinical deterioration. Furthermore, it is anticipated that the patient will be medically stable for discharge from the hospital within 2 midnights of admission.   Family Communication: Admission, patients condition and plan of care including tests being ordered have been discussed with the patient and husband who indicate understanding and agree with the plan and Code Status.  Consults called:  EDP calling neuro  Review of Systems: All systems were reviewed and were negative except as mentioned in HPI above. Negative for fever Negative for chest pain Negative for shortness of breath  Past Medical History:  Diagnosis Date   Arthritis    Bilateral lower extremity edema    left lower extremity cellulitis  Chronic heart failure with mildly reduced ejection fraction (HFmrEF, 41-49%) (HCC)    Common bile duct stone 06/22/2017   Diabetes (HCC)    Diverticulosis    Duodenal diverticulum, periampullary 11/03/2018   Aborted ERCP 2016   GERD (gastroesophageal reflux disease)    History of hiatal hernia    Hypertension    Kidney disease, chronic, stage III (GFR 30-59 ml/min) (HCC)    Obesity    Osteopenia    PAF (paroxysmal atrial fibrillation) (HCC)    Seizures (HCC) 22 yrs ago   no cause found   Tubular adenoma of colon 2009    Past Surgical History:  Procedure Laterality Date   ABDOMINAL HYSTERECTOMY     APPENDECTOMY     CHOLECYSTECTOMY OPEN  1977   COLONOSCOPY WITH PROPOFOL  N/A 10/16/2014   Procedure: COLONOSCOPY WITH PROPOFOL ;  Surgeon: Gwendlyn ONEIDA Buddy, MD;  Location: [facility];  Service: Endoscopy;  Laterality: N/A;    ENDOSCOPIC RETROGRADE CHOLANGIOPANCREATOGRAPHY (ERCP) WITH PROPOFOL  N/A 06/02/2017   Procedure: ENDOSCOPIC RETROGRADE CHOLANGIOPANCREATOGRAPHY (ERCP) WITH PROPOFOL ;  Surgeon: Buddy Gwendlyn ONEIDA, MD;  Location: [facility];  Service: Endoscopy;  Laterality: N/A;   ESOPHAGOGASTRODUODENOSCOPY N/A 04/01/2024   Procedure: EGD (ESOPHAGOGASTRODUODENOSCOPY);  Surgeon: Suzann Inocente HERO, MD;  Location: [facility];  Service: Gastroenterology;  Laterality: N/A;   TONSILLECTOMY       reports that she has quit smoking. Her smoking use included cigarettes. She has a 20 pack-year smoking history. She has never used smokeless tobacco. She reports that she does not drink alcohol and does not use drugs.  Allergies  Allergen Reactions   Levaquin [Levofloxacin In D5w] Nausea And Vomiting   Levofloxacin Nausea And Vomiting and Other (See Comments)    Abdominal pain, also   Ensure Diarrhea   Lisinopril Cough   Oxycodone  Nausea Only    Family History  Problem Relation Age of Onset   Dementia Sister    Hypertension Brother      Prior to Admission medications   Medication Sig Start Date End Date Taking? Authorizing Provider  acetaminophen  (TYLENOL  8 HOUR ARTHRITIS PAIN) 650 MG CR tablet Take 1,300 mg by mouth in the morning.    [provider]  allopurinol (ZYLOPRIM) 100 MG tablet Take 1 tablet (100 mg total) by mouth daily. 04/04/24   Regalado, Belkys A, MD  ALPRAZolam  (XANAX ) 0.25 MG tablet Take 0.25 mg by mouth 2 (two) times daily as needed for anxiety. 01/13/22   [provider]  apixaban  (ELIQUIS ) 2.5 MG TABS tablet Take 1 tablet (2.5 mg total) by mouth 2 (two) times daily. 04/04/24   Regalado, Belkys A, MD  apixaban  (ELIQUIS ) 2.5 MG TABS tablet Take 1 tablet (2.5 mg total) by mouth 2 (two) times daily. 04/08/24   Court Dorn PARAS, MD  calcium  carbonate (OSCAL) 1500 (600 Ca) MG TABS tablet Take 1,500 mg by mouth daily with breakfast.    [provider]  dapagliflozin  propanediol  (FARXIGA ) 10 MG TABS tablet Take 10 mg by mouth daily.    [provider]  diclofenac  Sodium (VOLTAREN ) 1 % GEL Apply 2 g topically 4 (four) times daily. Patient taking differently: Apply 2 g topically 4 (four) times daily as needed (for pain). 01/03/22   Sheikh, Omair Latif, DO  ferrous sulfate 325 (65 FE) MG EC tablet Take 1 tablet (325 mg total) by mouth daily with breakfast. 04/04/24 05/04/24  Regalado, Owen A, MD  furosemide  (LASIX ) 20 MG tablet Take 1 tablet (20 mg total) by mouth daily. 04/04/24  Regalado, Belkys A, MD  HYDROcodone -acetaminophen  (NORCO/VICODIN) 5-325 MG tablet Take 1 tablet by mouth See admin instructions. Take 1 tablet by mouth every 6-8 hours as needed for pain    [provider]  Lancets (ONETOUCH DELICA PLUS LANCET30G) MISC  07/09/21   [provider]  lovastatin (MEVACOR) 20 MG tablet Take 20 mg by mouth at bedtime.    [provider]  metoprolol  succinate (TOPROL -XL) 25 MG 24 hr tablet Take 1 tablet (25 mg total) by mouth daily. Take with or immediately following a meal. 04/04/24   Regalado, Belkys A, MD  Multiple Vitamin (MULTIVITAMIN WITH MINERALS) TABS tablet Take 1 tablet by mouth daily. 04/04/24   Regalado, Belkys A, MD  OVER THE COUNTER MEDICATION Take 1 capsule by mouth daily. Daily fiber 100% psyllium- Take 1 capsule by mouth once a day    [provider]  pantoprazole  (PROTONIX ) 40 MG tablet Take 1 tablet (40 mg total) by mouth 2 (two) times daily. 04/04/24 05/04/24  Regalado, Owen A, MD  Probiotic Product (ALIGN) 4 MG CAPS Take 4 mg by mouth daily.    [provider]  traZODone  (DESYREL ) 50 MG tablet Take 100 mg by mouth at bedtime as needed for sleep.    [provider]   r   Labs on Admission: I have personally reviewed following labs and imaging studies  CBC: Recent Labs  Lab 04/04/24 0509 04/10/24 1520  WBC 11.9* 12.8*  NEUTROABS  --  9.9*  HGB 9.7* 9.7*  HCT 30.8* 31.8*  MCV 97.8 98.5   PLT 271 263   Basic Metabolic Panel: Recent Labs  Lab 04/04/24 0509 04/10/24 1520  NA 143 140  K 4.2 3.9  CL 104 103  CO2 29 27  GLUCOSE 98 86  BUN 87* 36*  CREATININE 1.77* 1.82*  CALCIUM  8.9 9.1  MG  --  2.4   Estimated Creatinine Clearance: 25.5 mL/min (A) (by C-G formula based on SCr of 1.82 mg/dL (H)). Recent Labs  Lab 04/10/24 1520  AST 19  ALT 14  ALKPHOS 65  BILITOT 0.6  PROT 6.2*  ALBUMIN  3.5   No results for input(s): LIPASE, AMYLASE in the last 168 hours. No results for input(s): AMMONIA in the last 168 hours. Coagulation Profile: No results for input(s): INR, PROTIME in the last 168 hours. Cardiac Panel (last 3 results) No results for input(s): CKTOTAL, CKMB, TROPONINIHS, RELINDX in the last 72 hours.  BNP (last 3 results) Recent Labs    03/28/24 1753 04/10/24 1520  PROBNP 698.0* 953.0*   HbA1C: No results for input(s): HGBA1C in the last 72 hours. CBG: Recent Labs  Lab 04/04/24 0737  GLUCAP 87   Lipid Profile: No results for input(s): CHOL, HDL, LDLCALC, TRIG, CHOLHDL, LDLDIRECT in the last 72 hours. Thyroid  Function Tests: No results for input(s): TSH, T4TOTAL, FREET4, T3FREE, THYROIDAB in the last 72 hours. Urine analysis:    Component Value Date/Time   COLORURINE YELLOW 03/28/2024 1531   APPEARANCEUR HAZY (A) 03/28/2024 1531   LABSPEC 1.016 03/28/2024 1531   PHURINE 5.0 03/28/2024 1531   GLUCOSEU >=500 (A) 03/28/2024 1531   HGBUR NEGATIVE 03/28/2024 1531   BILIRUBINUR NEGATIVE 03/28/2024 1531   KETONESUR 5 (A) 03/28/2024 1531   PROTEINUR 100 (A) 03/28/2024 1531   NITRITE NEGATIVE 03/28/2024 1531   LEUKOCYTESUR NEGATIVE 03/28/2024 1531    Radiological Exams on Admission: CT Head Wo Contrast Result Date: 04/10/2024 CLINICAL DATA:  Neuro deficit, acute, stroke suspected Extremity weakness, Neuro deficit,  acute, stroke suspected EXAM: CT HEAD WITHOUT CONTRAST TECHNIQUE: Contiguous axial  images were obtained from the base of the skull through the vertex without intravenous contrast. RADIATION DOSE REDUCTION: This exam was performed according to the departmental dose-optimization program which includes automated exposure control, adjustment of the mA and/or kV according to patient size and/or use of iterative reconstruction technique. COMPARISON:  None Available. FINDINGS: Brain: Patchy and confluent areas of decreased attenuation are noted throughout the deep and periventricular white matter of the cerebral hemispheres bilaterally, compatible with chronic microvascular ischemic disease. No evidence of large-territorial acute infarction. No parenchymal hemorrhage. No mass lesion. No extra-axial collection. No mass effect or midline shift. No hydrocephalus. Basilar cisterns are patent. Vascular: No hyperdense vessel. Atherosclerotic calcifications are present within the cavernous internal carotid arteries. Skull: No acute fracture or focal lesion. Sinuses/Orbits: Paranasal sinuses and mastoid air cells are clear. Bilateral lens replacement. Otherwise the orbits are unremarkable. Other: None. IMPRESSION: No acute intracranial abnormality. Electronically Signed   By: Morgane  Naveau M.D.   On: 04/10/2024 19:42   DG Chest Portable 1 View Result Date: 04/10/2024 CLINICAL DATA:  Leg weakness. Difficulty getting up and down for 2 weeks. EXAM: PORTABLE CHEST 1 VIEW COMPARISON:  03/28/2024 FINDINGS: Shallow inspiration. Cardiac enlargement with pulmonary vascular congestion. Atelectasis in the lung bases. No edema or consolidation. Similar appearance to previous study. No pleural effusion or pneumothorax. Calcified and tortuous aorta. Old right rib fractures. Degenerative changes in the spine and shoulders. IMPRESSION: Shallow inspiration with atelectasis in the lung bases. Cardiac enlargement with mild vascular congestion. No edema or consolidation. Similar appearance to previous study. Electronically  Signed   By: Elsie Gravely M.D.   On: 04/10/2024 17:21    Mennie LAMY MD [facility]  If 7PM-7AM, please contact night-coverage [email or web address]  04/10/2024, 8:56 PM

## 2024-04-10 NOTE — ED Triage Notes (Signed)
 Pt BIB EMS from home with reports of leg weakness. Pt is having a hard time getting up and down x 2 weeks. Pt normally is able to use her walker.

## 2024-04-10 NOTE — ED Notes (Signed)
 Overdue Medications not verified

## 2024-04-10 NOTE — ED Notes (Signed)
 Pt to CT

## 2024-04-11 DIAGNOSIS — N1832 Chronic kidney disease, stage 3b: Secondary | ICD-10-CM

## 2024-04-11 DIAGNOSIS — R531 Weakness: Secondary | ICD-10-CM

## 2024-04-11 DIAGNOSIS — I480 Paroxysmal atrial fibrillation: Secondary | ICD-10-CM

## 2024-04-11 DIAGNOSIS — E66813 Obesity, class 3: Secondary | ICD-10-CM

## 2024-04-11 LAB — CBC
HCT: 32.3 % — ABNORMAL LOW (ref 36.0–46.0)
Hemoglobin: 10.1 g/dL — ABNORMAL LOW (ref 12.0–15.0)
MCH: 30.7 pg (ref 26.0–34.0)
MCHC: 31.3 g/dL (ref 30.0–36.0)
MCV: 98.2 fL (ref 80.0–100.0)
Platelets: 256 K/uL (ref 150–400)
RBC: 3.29 MIL/uL — ABNORMAL LOW (ref 3.87–5.11)
RDW: 15.1 % (ref 11.5–15.5)
WBC: 12.8 K/uL — ABNORMAL HIGH (ref 4.0–10.5)
nRBC: 0 % (ref 0.0–0.2)

## 2024-04-11 LAB — GLUCOSE, CAPILLARY
Glucose-Capillary: 112 mg/dL — ABNORMAL HIGH (ref 70–99)
Glucose-Capillary: 88 mg/dL (ref 70–99)
Glucose-Capillary: 95 mg/dL (ref 70–99)

## 2024-04-11 LAB — URINALYSIS, W/ REFLEX TO CULTURE (INFECTION SUSPECTED)
Bilirubin Urine: NEGATIVE
Glucose, UA: 50 mg/dL — AB
Hgb urine dipstick: NEGATIVE
Ketones, ur: NEGATIVE mg/dL
Nitrite: NEGATIVE
Protein, ur: NEGATIVE mg/dL
Specific Gravity, Urine: 1.005 (ref 1.005–1.030)
pH: 7 (ref 5.0–8.0)

## 2024-04-11 LAB — BASIC METABOLIC PANEL WITH GFR
Anion gap: 8 (ref 5–15)
BUN: 32 mg/dL — ABNORMAL HIGH (ref 8–23)
CO2: 30 mmol/L (ref 22–32)
Calcium: 9 mg/dL (ref 8.9–10.3)
Chloride: 103 mmol/L (ref 98–111)
Creatinine, Ser: 1.81 mg/dL — ABNORMAL HIGH (ref 0.44–1.00)
GFR, Estimated: 27 mL/min — ABNORMAL LOW (ref >60)
Glucose, Bld: 90 mg/dL (ref 70–99)
Potassium: 3.7 mmol/L (ref 3.5–5.1)
Sodium: 142 mmol/L (ref 135–145)

## 2024-04-11 LAB — RPR: RPR Ser Ql: NONREACTIVE

## 2024-04-11 LAB — CBG MONITORING, ED: Glucose-Capillary: 90 mg/dL (ref 70–99)

## 2024-04-11 LAB — T4, FREE: Free T4: 1 ng/dL (ref 0.61–1.12)

## 2024-04-11 MED ORDER — THIAMINE MONONITRATE 100 MG PO TABS
100.0000 mg | ORAL_TABLET | Freq: Every day | ORAL | Status: DC
  Administered 2024-04-11 – 2024-04-18 (×8): 100 mg via ORAL
  Filled 2024-04-11 (×8): qty 1

## 2024-04-11 MED ORDER — LORAZEPAM 2 MG/ML IJ SOLN: 1.0000 mg | Freq: Once | INTRAMUSCULAR | Status: DC

## 2024-04-11 MED ORDER — INFLUENZA VAC SPLIT HIGH-DOSE 0.5 ML IM SUSY
0.5000 mL | PREFILLED_SYRINGE | INTRAMUSCULAR | Status: AC
  Administered 2024-04-12: 0.5 mL via INTRAMUSCULAR
  Filled 2024-04-11: qty 0.5

## 2024-04-11 MED ORDER — LOSARTAN POTASSIUM 25 MG PO TABS
25.0000 mg | ORAL_TABLET | Freq: Every day | ORAL | Status: DC
  Administered 2024-04-11 – 2024-04-13 (×3): 25 mg via ORAL
  Filled 2024-04-11 (×3): qty 1

## 2024-04-11 NOTE — Consult Note (Signed)
 NEUROLOGY CONSULT NOTE   Date of service: April 11, 2024 Patient Name: Pamela [name] MRN:  [id number] DOB:  Jan 13, 1935 Chief Complaint: Bilateral lower extremity weakness Requesting Provider: Odell Celinda Balo, MD  History of Present Illness  Pamela [name] is a 89 y.o. female with hx of diabetes, CKD stage IV, anemia, A-fib and PE on Eliquis , osteoarthritis of the knees and CHF with recent admission on 10/2 for decompensated heart failure who presents with leg weakness and difficulty standing and ambulating.  Patient reports that her leg weakness started on 02/24/2024 when she had a fall.  She says that she fell striking her leg and did not hit her back or head.  After that, she mostly stayed in her lift chair, sleeping and spending her whole day in the chair and only getting up to use a commode to the side of her chair.  She had no incontinence and no back pain.  She has no sensory deficits in her legs.  She reports that starting yesterday, she has been unable to stand and transfer from her chair to her commode and sought care.  She denies other symptoms.  She states that her diet has not been great.  She has seen physical therapy a few times at home and states that she was able to stand and mobilize a little bit with them.  MRI of the spine was initially ordered, but patient declines this study.  ROS  Comprehensive ROS performed and pertinent positives documented in HPI   Past History   Past Medical History:  Diagnosis Date   Arthritis    Bilateral lower extremity edema    left lower extremity cellulitis   Chronic heart failure with mildly reduced ejection fraction (HFmrEF, 41-49%) (HCC)    Common bile duct stone 06/22/2017   Diabetes (HCC)    Diverticulosis    Duodenal diverticulum, periampullary 11/03/2018   Aborted ERCP 2016   GERD (gastroesophageal reflux disease)    History of hiatal hernia    Hypertension    Kidney disease, chronic, stage III (GFR 30-59 ml/min) (HCC)     Obesity    Osteopenia    PAF (paroxysmal atrial fibrillation) (HCC)    Seizures (HCC) 22 yrs ago   no cause found   Tubular adenoma of colon 2009    Past Surgical History:  Procedure Laterality Date   ABDOMINAL HYSTERECTOMY     APPENDECTOMY     CHOLECYSTECTOMY OPEN  1977   COLONOSCOPY WITH PROPOFOL  N/A 10/16/2014   Procedure: COLONOSCOPY WITH PROPOFOL ;  Surgeon: Gwendlyn ONEIDA Buddy, MD;  Location: [facility];  Service: Endoscopy;  Laterality: N/A;   ENDOSCOPIC RETROGRADE CHOLANGIOPANCREATOGRAPHY (ERCP) WITH PROPOFOL  N/A 06/02/2017   Procedure: ENDOSCOPIC RETROGRADE CHOLANGIOPANCREATOGRAPHY (ERCP) WITH PROPOFOL ;  Surgeon: Buddy Gwendlyn ONEIDA, MD;  Location: [facility] ENDOSCOPY;  Service: Endoscopy;  Laterality: N/A;   ESOPHAGOGASTRODUODENOSCOPY N/A 04/01/2024   Procedure: EGD (ESOPHAGOGASTRODUODENOSCOPY);  Surgeon: Suzann Inocente CHRISTELLA, MD;  Location: [facility];  Service: Gastroenterology;  Laterality: N/A;   TONSILLECTOMY      Family History: Family History  Problem Relation Age of Onset   Dementia Sister    Hypertension Brother     Social History  reports that she has quit smoking. Her smoking use included cigarettes. She has a 20 pack-year smoking history. She has never used smokeless tobacco. She reports that she does not drink alcohol and does not use drugs.  Allergies  Allergen Reactions   Levaquin [Levofloxacin In D5w] Nausea And Vomiting   Levofloxacin Nausea  And Vomiting and Other (See Comments)    Abdominal pain, also   Ensure Diarrhea   Lisinopril Cough   Oxycodone  Nausea Only    Medications   Current Facility-Administered Medications:    acetaminophen  (TYLENOL ) tablet 650 mg, 650 mg, Oral, Q6H PRN **OR** acetaminophen  (TYLENOL ) suppository 650 mg, 650 mg, Rectal, Q6H PRN, Kc, Ramesh, MD   acidophilus (RISAQUAD) capsule 1 capsule, 1 capsule, Oral, Daily, Kc, Ramesh, MD, 1 capsule at 04/11/24 1035   allopurinol (ZYLOPRIM) tablet 100 mg, 100 mg, Oral, Daily, Kc, Ramesh, MD,  100 mg at 04/11/24 1034   ALPRAZolam  (XANAX ) tablet 0.25 mg, 0.25 mg, Oral, BID PRN, Kc, Mennie, MD   apixaban  (ELIQUIS ) tablet 2.5 mg, 2.5 mg, Oral, BID, Kc, Ramesh, MD, 2.5 mg at 04/11/24 0936   calcium  carbonate (OS-CAL - dosed in mg of elemental calcium ) tablet 1,250 mg, 1,250 mg, Oral, Q breakfast, Kc, Ramesh, MD, 1,250 mg at 04/11/24 9063   dapagliflozin  propanediol (FARXIGA ) tablet 10 mg, 10 mg, Oral, Daily, Kc, Ramesh, MD, 10 mg at 04/11/24 1035   ferrous sulfate tablet 325 mg, 325 mg, Oral, Q breakfast, Kc, Ramesh, MD, 325 mg at 04/11/24 0936   furosemide  (LASIX ) tablet 20 mg, 20 mg, Oral, Daily, Kc, Ramesh, MD, 20 mg at 04/11/24 9062   HYDROcodone -acetaminophen  (NORCO/VICODIN) 5-325 MG per tablet 1 tablet, 1 tablet, Oral, Q6H PRN, Kc, Ramesh, MD   insulin  aspart (novoLOG ) injection 0-5 Units, 0-5 Units, Subcutaneous, QHS, Kc, Ramesh, MD   insulin  aspart (novoLOG ) injection 0-9 Units, 0-9 Units, Subcutaneous, TID WC, Kc, Ramesh, MD   LORazepam  (ATIVAN ) injection 1 mg, 1 mg, Intravenous, Once, Chavez, Abigail, NP   losartan  (COZAAR ) tablet 25 mg, 25 mg, Oral, Daily, Odell Celinda Balo, MD, 25 mg at 04/11/24 9351   metoprolol  succinate (TOPROL -XL) 24 hr tablet 25 mg, 25 mg, Oral, Daily, Kc, Ramesh, MD, 25 mg at 04/11/24 9062   Muscle Rub CREA, , Topical, PRN, Kc, Ramesh, MD   pantoprazole  (PROTONIX ) EC tablet 40 mg, 40 mg, Oral, BID, Kc, Ramesh, MD, 40 mg at 04/11/24 9063   pravastatin  (PRAVACHOL ) tablet 10 mg, 10 mg, Oral, QHS, Kc, Ramesh, MD   senna-docusate (Senokot-S) tablet 1 tablet, 1 tablet, Oral, QHS PRN, Kc, Ramesh, MD   traZODone  (DESYREL ) tablet 100 mg, 100 mg, Oral, QHS PRN, Kc, Ramesh, MD  Current Outpatient Medications:    acetaminophen  (TYLENOL  8 HOUR ARTHRITIS PAIN) 650 MG CR tablet, Take 1,300 mg by mouth in the morning., Disp: , Rfl:    allopurinol (ZYLOPRIM) 100 MG tablet, Take 1 tablet (100 mg total) by mouth daily., Disp: 30 tablet, Rfl: 0   ALPRAZolam  (XANAX ) 0.25  MG tablet, Take 0.25 mg by mouth 2 (two) times daily as needed for anxiety., Disp: , Rfl:    apixaban  (ELIQUIS ) 2.5 MG TABS tablet, Take 1 tablet (2.5 mg total) by mouth 2 (two) times daily., Disp: 60 tablet, Rfl: 0   apixaban  (ELIQUIS ) 2.5 MG TABS tablet, Take 1 tablet (2.5 mg total) by mouth 2 (two) times daily., Disp: 28 tablet, Rfl:    calcium  carbonate (OSCAL) 1500 (600 Ca) MG TABS tablet, Take 1,500 mg by mouth daily with breakfast., Disp: , Rfl:    dapagliflozin  propanediol (FARXIGA ) 10 MG TABS tablet, Take 10 mg by mouth daily., Disp: , Rfl:    diclofenac  Sodium (VOLTAREN ) 1 % GEL, Apply 2 g topically 4 (four) times daily. (Patient taking differently: Apply 2 g topically 4 (four) times daily as needed (for pain).), Disp:  50 g, Rfl: 0   ferrous sulfate 325 (65 FE) MG EC tablet, Take 1 tablet (325 mg total) by mouth daily with breakfast., Disp: 30 tablet, Rfl: 0   furosemide  (LASIX ) 20 MG tablet, Take 1 tablet (20 mg total) by mouth daily., Disp: 30 tablet, Rfl: 1   HYDROcodone -acetaminophen  (NORCO/VICODIN) 5-325 MG tablet, Take 1 tablet by mouth See admin instructions. Take 1 tablet by mouth every 6-8 hours as needed for pain, Disp: , Rfl:    Lancets (ONETOUCH DELICA PLUS LANCET30G) MISC, , Disp: , Rfl:    lovastatin (MEVACOR) 20 MG tablet, Take 20 mg by mouth at bedtime., Disp: , Rfl:    metoprolol  succinate (TOPROL -XL) 25 MG 24 hr tablet, Take 1 tablet (25 mg total) by mouth daily. Take with or immediately following a meal., Disp: 30 tablet, Rfl: 0   Multiple Vitamin (MULTIVITAMIN WITH MINERALS) TABS tablet, Take 1 tablet by mouth daily., Disp: 30 tablet, Rfl: 0   OVER THE COUNTER MEDICATION, Take 1 capsule by mouth daily. Daily fiber 100% psyllium- Take 1 capsule by mouth once a day, Disp: , Rfl:    pantoprazole  (PROTONIX ) 40 MG tablet, Take 1 tablet (40 mg total) by mouth 2 (two) times daily., Disp: 30 tablet, Rfl: 1   Probiotic Product (ALIGN) 4 MG CAPS, Take 4 mg by mouth daily., Disp: ,  Rfl:    traZODone  (DESYREL ) 50 MG tablet, Take 100 mg by mouth at bedtime as needed for sleep., Disp: , Rfl:   Vitals   Vitals:   04/11/24 0258 04/11/24 0300 04/11/24 0400 04/11/24 0725  BP:  (!) 156/82 (!) 152/74 (!) 137/54  Pulse:  (!) 58 (!) 59 70  Resp:  16 (!) 27 (!) 21  Temp: 98 F (36.7 C)   97.9 F (36.6 C)  TempSrc:    Oral  SpO2:  92% 93% 98%    There is no height or weight on file to calculate BMI.   Physical Exam   Constitutional: Appears well-developed and well-nourished.  Psych: Affect appropriate to situation.  Eyes: No scleral injection.  HENT: No OP obstruction. Good active ROM of the neck without any pain Head: Normocephalic.  Cardiovascular: Normal rate and regular rhythm.  Respiratory: Effort normal, non-labored breathing.  Skin: WDI.   Neurologic Examination    NEURO:  Mental Status: AA&Ox3, able to give clear and coherent history of present illness Speech/Language: speech is without dysarthria or aphasia.    Cranial Nerves:  II: PERRL.  III, IV, VI: EOMI. Eyelids elevate symmetrically.  V: Sensation is intact to light touch and symmetrical to face.  VII: Smile is symmetrical. VIII: hearing intact to voice. IX, X: Phonation is normal.  KP:Dynloizm shrug 5/5. XII: tongue is midline without fasciculations. Motor: 5/5 strength to bilateral upper extremities, 4+/5 strength to bilateral lower extremities proximal and distal on NP exam On MD exam, mild diffuse weakness, 4+/5 bilateral hip flexion, 5/5 foot dorsiflexion/plantarflexion, 4/5 knee flexion and extension but these are pain limited and worse on the right knee than the left where she reports more pain Reflexes: 2+ and symmetric brachioradialis and biceps. No Hoffmans. Patellar difficult to elicit due to guarding/pain Tone: is normal and bulk is normal Sensation- Intact to light touch bilaterally.  No numbness or tingling to the legs Gait- deferred    Labs/Imaging/Neurodiagnostic studies    CBC:  Recent Labs  Lab 04/10/24 1520 04/11/24 0452  WBC 12.8* 12.8*  NEUTROABS 9.9*  --   HGB 9.7* 10.1*  HCT  31.8* 32.3*  MCV 98.5 98.2  PLT 263 256   Basic Metabolic Panel:  Lab Results  Component Value Date   NA 142 04/11/2024   K 3.7 04/11/2024   CO2 30 04/11/2024   GLUCOSE 90 04/11/2024   BUN 32 (H) 04/11/2024   CREATININE 1.81 (H) 04/11/2024   CALCIUM  9.0 04/11/2024   GFRNONAA 27 (L) 04/11/2024   GFRAA 23 (L) 10/11/2019   Lipid Panel:  Lab Results  Component Value Date   LDLCALC 44 03/31/2024   HgbA1c:  Lab Results  Component Value Date   HGBA1C 5.7 (H) 03/29/2024   APTT  Lab Results  Component Value Date   APTT 30 12/29/2021  Thiamine pending RPR nonreactive TSH 2 B12 647  CT Head without contrast(Personally reviewed): No acute abnormalities   ASSESSMENT   Pamela [name] is a 89 y.o. female with hx of diabetes, CKD stage IV, anemia, A-fib and PE on Eliquis , osteoarthritis of the knees and CHF who presents with increasing bilateral lower extremity weakness.  Patient reports that on 23 August, she had a fall and has been unable to ambulate since then.  She has spent most of her time in her lift chair and has transferred back and forth to her bedside commode.  She has worked with physical therapy a few times at home and was able to mobilize with them.  Starting yesterday, she has been unable to stand.  She has no sensory deficit, no paresthesias or numbness in her lower extremities and denies any type of back pain.  When she fell, she landed on one of her legs and did not strike her back or head.  CT of the head shows no acute abnormalities, and his weakness is symmetrical, doubt that a stroke is the source of the weakness.  Patient has declined MRI and CT of the spine.  Given patient's history, strongly suspect that deconditioning in the setting of being in a lift chair for several weeks is the most likely source of her lower extremity weakness.  Recommend  intensive physical therapy and likely rehab admission.  Would like to obtain spinal imaging, but patient declines both CT and MRI.  Given poor diet, supplementing thiamine would be reasonable.  RECOMMENDATIONS  -Thiamine 100 mg daily - PT/OT, recommend rehab admission - Management of comorbidities per primary team - Neurology will sign off, please call with questions or concerns ______________________________________________________________________  Patient seen by NP and then by MD, MD to addend note as needed.  Signed, Anirudh Baiz E Everitt Clint Kill, NP [facility]   Attending Neurologist's note:  I personally saw this patient, gathering history, performing a full neurologic examination (with edits made to exam section as needed), reviewing relevant labs, personally reviewing relevant imaging including CT head, and formulated the assessment and plan, adding the note above for completeness and clarity to accurately reflect my thoughts  Discussed with patient / and husband at bedside.  Suspect worsening anxiety after her fall on 8/23 led to deconditioning which then led to further worsening anxiety about ambulation due to weakness from deconditioning.  Certainly rapid improvement in the last 24 hours is reassuring.  Okay to defer imaging at this time but would pursue it if she has any concerning worsening of bilateral lower extremity weakness in the future  Discussed with primary team via secure chat  Lola Jernigan MD-PhD [facility] [phone]

## 2024-04-11 NOTE — Evaluation (Signed)
 Occupational Therapy Evaluation Patient Details Name: Pamela [name] MRN: [id number] DOB: 09/07/1934 Today's Date: 04/11/2024   History of Present Illness   89 yo female presents to ED 04/10/24, just DC'd 10/2, with LE weakness, inability to walk. H/O fall several weeks prior. PMH : HF, arthritis, HTN, DM II, CKD IIIb, HLD and PE, GIB, gout, PAF, venous stasis dermatitis, anemia     Clinical Impressions PTA, patient lives with husband who had been assisting with mobility and ADL's and HH services had been initiated as patient was just hospitalized and patient and family declined recommendation for rehab. Therapy in home and patient reports she was able to access shower one time this week but then unable to stand on day of ED presentation due to weakness. Currently, patient presents with deficits outlined below (see OT Problem List for details) most significantly LE discomfort/heaviness, generalized muscle weakness, increased body habitus, s/s of anxiety, decreased activity tolerance and balance limiting ADL's and functional mobility performance. Patient requires continued Acute care hospital level OT services to progress safety and functional performance and allow for discharge. Patient will benefit from continued inpatient follow up therapy, <3 hours/day.        If plan is discharge home, recommend the following:   Two people to help with walking and/or transfers;A lot of help with bathing/dressing/bathroom;Assistance with cooking/housework;Assistance with feeding;Direct supervision/assist for medications management;Direct supervision/assist for financial management;Assist for transportation;Help with stairs or ramp for entrance     Functional Status Assessment   Patient has had a recent decline in their functional status and demonstrates the ability to make significant improvements in function in a reasonable and predictable amount of time.     Equipment Recommendations   Other  (comment) (may benefit from a STEDY, will provide educ to family for online access for purchase)      Precautions/Restrictions   Precautions Precautions: Fall Precaution/Restrictions Comments: monitor sats Restrictions Weight Bearing Restrictions Per Provider Order: No     Mobility Bed Mobility Overal bed mobility: Needs Assistance Bed Mobility: Rolling, Supine to Sit, Sit to Supine Rolling: Mod assist, Used rails   Supine to sit: HOB elevated, Used rails, Mod assist Sit to supine: Max assist, +2 for physical assistance, +2 for safety/equipment, HOB elevated, Used rails   General bed mobility comments: cues, assist for LE mobility back up onto bed and bed positioning    Transfers Overall transfer level:  (unable to complete STS due to ED guerney, short stature and body habitus with high falls risk)                        Balance Overall balance assessment: Needs assistance Sitting-balance support: Feet unsupported, No upper extremity supported Sitting balance-Leahy Scale: Fair                                     ADL either performed or assessed with clinical judgement   ADL Overall ADL's : Needs assistance/impaired Eating/Feeding: Set up;Bed level   Grooming: Set up;Bed level   Upper Body Bathing: Minimal assistance;Sitting;Moderate assistance   Lower Body Bathing: Total assistance;Bed level   Upper Body Dressing : Minimal assistance;Sitting   Lower Body Dressing: Total assistance;Bed level     Toilet Transfer Details (indicate cue type and reason): unable to trial due to STEDY would not offer enough support from ED guerney safely, will trial in reg hospital bed Toileting- Clothing  Manipulation and Hygiene: Total assistance;Bed level Toileting - Clothing Manipulation Details (indicate cue type and reason): usign [place]     Functional mobility during ADLs: Maximal assistance;+2 for physical assistance;+2 for safety/equipment (EOB from  ED guerney) General ADL Comments: poor LE functional reach     Vision Baseline Vision/History: 1 Wears glasses Ability to See in Adequate Light: 0 Adequate Patient Visual Report: No change from baseline Vision Assessment?: No apparent visual deficits            Pertinent Vitals/Pain Pain Assessment Pain Assessment: Faces Faces Pain Scale: Hurts a little bit Pain Location: R LE Pain Descriptors / Indicators: Heaviness Pain Intervention(s): Limited activity within patient's tolerance, Monitored during session, Repositioned, Relaxation     Extremity/Trunk Assessment Upper Extremity Assessment Upper Extremity Assessment: Generalized weakness RUE Deficits / Details: tremor RUE Coordination: decreased fine motor LUE Deficits / Details: tremor LUE Coordination: decreased fine motor   Lower Extremity Assessment Lower Extremity Assessment: Defer to PT evaluation   Cervical / Trunk Assessment Cervical / Trunk Assessment: Normal;Other exceptions Cervical / Trunk Exceptions: body habitus   Communication Communication Communication: No apparent difficulties   Cognition Arousal: Alert Behavior During Therapy: Anxious Cognition: No apparent impairments             OT - Cognition Comments: Aware of anxiety. Pt wth tremors that do seem to exacerbate when pt facing challenges with mobility incl STS                 Following commands: Intact       Cueing  General Comments   Cueing Techniques: Verbal cues  2 ltrs 98% then removed for RA and O2 sats dropped to 88% with EOB mobility thus replaced O2 via [place] and sats returned to >95%, mild distal LE redness, increased body habitus           Home Living Family/patient expects to be discharged to:: Private residence Living Arrangements: Spouse/significant other Available Help at Discharge: Family;Available 24 hours/day Type of Home: House Home Access: Stairs to enter [employer] of Steps: 1   Home  Layout: One level     Bathroom Shower/Tub: Producer, television/film/video: Handicapped height     Home Equipment: Agricultural consultant (2 wheels);Rollator (4 wheels);Wheelchair - manual;Shower seat   Additional Comments: 3 wheeled walker      Prior Functioning/Environment Prior Level of Function : Needs assist             Mobility Comments: has not amb. x 5 weeks, spouse assisting transfers from lift recliner to [facility], rolls to shower and transfers to seat amd reports she did do this with HH yesterday ADLs Comments: spouse has been assisting with ADL's including sponge bathing recliner level, and LB dressing until [facility] showered before ED    OT Problem List: Decreased strength;Decreased knowledge of use of DME or AE;Increased edema;Obesity;Decreased coordination;Decreased range of motion;Decreased activity tolerance;Pain;Impaired balance (sitting and/or standing)   OT Treatment/Interventions: Self-care/ADL training;Balance training;Therapeutic exercise;Therapeutic activities;DME and/or AE instruction;Patient/family education      OT Goals(Current goals can be found in the care plan section)   Acute Rehab OT Goals Patient Stated Goal: to keep trying to stand OT Goal Formulation: With patient/family Time For Goal Achievement: 04/25/24 Potential to Achieve Goals: Fair ADL Goals Pt Will Perform Lower Body Bathing: with min assist;with adaptive equipment;sitting/lateral leans Pt Will Perform Lower Body Dressing: with min assist;with adaptive equipment;sitting/lateral leans Pt Will Transfer to Toilet: with +2 assist;with mod assist;bedside commode Pt  Will Perform Toileting - Clothing Manipulation and hygiene: sitting/lateral leans;with min assist;with adaptive equipment Pt/caregiver will Perform Home Exercise Program: Increased strength;Both right and left upper extremity;With Supervision;With written HEP provided   OT Frequency:  Min 2X/week    Co-evaluation PT/OT/SLP  Co-Evaluation/Treatment: Yes Reason for Co-Treatment: For patient/therapist safety PT goals addressed during session: Mobility/safety with mobility;Balance OT goals addressed during session: ADL's and self-care;Proper use of Adaptive equipment and DME      AM-PAC OT 6 Clicks Daily Activity     Outcome Measure Help from another person eating meals?: A Little Help from another person taking care of personal grooming?: A Little Help from another person toileting, which includes using toliet, bedpan, or urinal?: Total Help from another person bathing (including washing, rinsing, drying)?: A Lot Help from another person to put on and taking off regular upper body clothing?: A Lot Help from another person to put on and taking off regular lower body clothing?: Total 6 Click Score: 12   End of Session Equipment Utilized During Treatment: Gait belt;Oxygen  Nurse Communication: Mobility status  Activity Tolerance: Patient limited by fatigue Patient left: in bed;with bed alarm set;with call bell/phone within reach;with family/visitor present  OT Visit Diagnosis: Unsteadiness on feet (R26.81);Pain;Muscle weakness (generalized) (M62.81) Pain - part of body:  (B LEs)                Time: 9051-8980 OT Time Calculation (min): 31 min Charges:  OT General Charges $OT Visit: 1 Visit OT Evaluation $OT Eval Low Complexity: 1 Low  Kaylena Pacifico OT/L Acute Rehabilitation Department  [phone]  04/11/2024, 1:34 PM

## 2024-04-11 NOTE — Evaluation (Signed)
 Physical Therapy Evaluation Patient Details Name: Pamela [name] MRN: [id number] DOB: 12-04-1934 Today's Date: 04/11/2024  History of Present Illness  89 yo female presents to ED 04/10/24, just DC'd 10/2, with LE weakness, inability to walk. H/O fall several weeks prior. PMH : HF, arthritis, HTN, DM II, CKD IIIb, HLD and PE, GIB, gout, PAF, venous stasis dermatitis, anemia  Clinical Impression  Pt admitted with above diagnosis.  Pt currently with functional limitations due to the deficits listed below (see PT Problem List). Pt will benefit from acute skilled PT to increase their independence and safety with mobility to allow discharge.       Patient well known  to PT from recent admission.patient reports similar issues with not being able to stand. Patient  did DC  Home, transferring with mod support. Patient reports  that she sleeps in lift recliner, was able to transfer to Wc or rollator with spouse assist. Patient reports onset of fear to fall and leg/body jerks.  Patient did not  note the jerking during visit, Patient able to self assist moving to sitting, unable to safely assist standing due to stretcher height and  pt. Feet not touching floor.  Patient again wants to return home, did encourage SNF/rehab. Patient will benefit from continued inpatient follow up therapy, <3 hours/day   SPO2 dropped to 87% on RA, replaced on 2 L.    If plan is discharge home, recommend the following: Two people to help with walking and/or transfers;A lot of help with bathing/dressing/bathroom;Assistance with cooking/housework;Assist for transportation;Help with stairs or ramp for entrance   Can travel by private vehicle        Equipment Recommendations None recommended by PT  Recommendations for Other Services       Functional Status Assessment Patient has had a recent decline in their functional status and/or demonstrates limited ability to make significant improvements in function in a reasonable and  predictable amount of time     Precautions / Restrictions Precautions Precaution/Restrictions Comments: monitor sats Restrictions Weight Bearing Restrictions Per Provider Order: No      Mobility  Bed Mobility   Bed Mobility: Rolling, Supine to Sit, Sit to Supine Rolling: Mod assist, Used rails   Supine to sit: HOB elevated, Used rails, Mod assist Sit to supine: Max assist, +2 for physical assistance, +2 for safety/equipment, HOB elevated, Used rails   General bed mobility comments: pt able to move legs to bed edge, assist trunk to sitting, patient able to scoot to edge, able to lean to ech side to straighten pad,  assist for LE mobility back up onto bed and bed positioning    Transfers Overall transfer level: Needs assistance                 General transfer comment: unable to test  due to stretcher high and feet do not touch floor.    Ambulation/Gait                  Stairs            Wheelchair Mobility     Tilt Bed    Modified Rankin (Stroke Patients Only)       Balance   Sitting-balance support: Feet unsupported, No upper extremity supported Sitting balance-Leahy Scale: Fair Sitting balance - Comments: able  to pull self forward to long sitting  Pertinent Vitals/Pain Pain Assessment Faces Pain Scale: Hurts little more Pain Location: R LE Pain Descriptors / Indicators: Heaviness, Discomfort Pain Intervention(s): Limited activity within patient's tolerance, Monitored during session, Repositioned    Home Living Family/patient expects to be discharged to:: Private residence Living Arrangements: Spouse/significant other Available Help at Discharge: Family;Available 24 hours/day Type of Home: House Home Access: Stairs to enter   [employer] of Steps: 1   Home Layout: One level Home Equipment: Agricultural consultant (2 wheels);Rollator (4 wheels);Wheelchair - Government social research officer Comments: 3 wheeled walker    Prior Function Prior Level of Function : Needs assist             Mobility Comments: has not amb. > 5 weeks, spouse assisting transfers from lift recliner to [facility], rolls to shower and transfers to seat amd reports she did do this with HH yesterday ADLs Comments: spouse has been assisting with ADL's including sponge bathing recliner level, and LB dressing until [facility] showered before ED     Extremity/Trunk Assessment   Upper Extremity Assessment Upper Extremity Assessment: Generalized weakness RUE Deficits / Details: tremor RUE Coordination: decreased fine motor LUE Deficits / Details: tremor LUE Coordination: decreased fine motor    Lower Extremity Assessment Lower Extremity Assessment: Defer to PT evaluation RLE Deficits / Details: lower leg discoloration, edema, 3+/5 strength, can SLR LLE Deficits / Details: lower leg discoloration, 3+/5 strength, difficulty to SLR, pain    Cervical / Trunk Assessment Cervical / Trunk Assessment: Normal;Other exceptions Cervical / Trunk Exceptions: body habitus  Communication   Communication Communication: No apparent difficulties    Cognition Arousal: Alert Behavior During Therapy: Anxious   PT - Cognitive impairments: No apparent impairments                         Following commands: Intact       Cueing Cueing Techniques: Verbal cues     General Comments General comments (skin integrity, edema, etc.): 2 ltrs 98% then removed for RA and O2 sats dropped to 88% with EOB mobility thus replaced O2 via Speed and sats returned to >95%, mild distal LE redness, increased body habitus    Exercises     Assessment/Plan    PT Assessment Patient needs continued PT services  PT Problem List Decreased strength;Decreased coordination;Decreased range of motion;Decreased cognition;Decreased activity tolerance;Decreased mobility;Decreased skin integrity       PT Treatment Interventions  DME instruction;Therapeutic exercise;Gait training;Functional mobility training;Patient/family education    PT Goals (Current goals can be found in the Care Plan section)  Acute Rehab PT Goals Patient Stated Goal: go home, stand up PT Goal Formulation: With patient/family Time For Goal Achievement: 04/25/24 Potential to Achieve Goals: Fair    Frequency Min 2X/week     Co-evaluation PT/OT/SLP Co-Evaluation/Treatment: Yes Reason for Co-Treatment: For patient/therapist safety PT goals addressed during session: Mobility/safety with mobility;Balance OT goals addressed during session: ADL's and self-care;Proper use of Adaptive equipment and DME       AM-PAC PT 6 Clicks Mobility  Outcome Measure Help needed turning from your back to your side while in a flat bed without using bedrails?: A Lot Help needed moving from lying on your back to sitting on the side of a flat bed without using bedrails?: A Lot Help needed moving to and from a bed to a chair (including a wheelchair)?: A Lot Help needed standing up from a chair using your arms (e.g., wheelchair or bedside chair)?: Total Help needed  to walk in hospital room?: Total Help needed climbing 3-5 steps with a railing? : Total 6 Click Score: 5    End of Session Equipment Utilized During Treatment: Oxygen  Activity Tolerance: Patient tolerated treatment well Patient left: in bed;with call bell/phone within reach;with family/visitor present Nurse Communication: Mobility status;Need for lift equipment PT Visit Diagnosis: Unsteadiness on feet (R26.81);Other abnormalities of gait and mobility (R26.89);Repeated falls (R29.6);Muscle weakness (generalized) (M62.81);Pain Pain - Right/Left: Right Pain - part of body: Knee    Time: 9051-8980 PT Time Calculation (min) (ACUTE ONLY): 31 min   Charges:   PT Evaluation $PT Eval Low Complexity: 1 Low   PT General Charges $$ ACUTE PT VISIT: 1 Visit         Darice Potters PT Acute  Rehabilitation Services Office [phone]   Potters Darice Norris 04/11/2024, 2:41 PM

## 2024-04-11 NOTE — Progress Notes (Signed)
 [facility] PROGRESS NOTE    Progress Note  Pamela [name]  FMW:[id number] DOB: [birth year] DOA: 04/10/2024 PCP: [name], MD     Brief Narrative:   Pamela [name] is an 89 y.o. female past medical history of diabetes mellitus type 2, chronic kidney stage IV, anemia of chronic disease, A-fib PE on Eliquis , chronic combined systolic and diastolic heart failure, she was recently discharged on 04/04/2024 for acute decompensated heart failure, comes into the ED with leg weakness and difficulty getting up for 2 weeks,   Assessment/Plan:   Bilateral lower extremity Weakness/ambulatory dysfunction: She has severe arthritis of the knees bilaterally. She is able to move her extremities with no incontinence. She relates she has had bilateral lower extremity weakness for the past 5 weeks which has been gradual. Cancel MRI of the brain, cervical spine, thoracic spine and lumbar spine. She has never had back problem she has bilateral lower extremity weakness, I do not believe she has had a stroke. Of note the patient relates she also does not want to have an MRI, she relates she is 89 years old, thinking about getting this MRI makes her really nervous and anxious. I explained the risk and benefits about this to the patient, I relayed to her that an MRI of the brain would be a good idea, but she understands and she would like not to have it. CT of the head showed no acute abnormalities. Will consult PT OT  Chronic combined systolic and diastolic heart failure/essential hypertension: Chest x-ray is clear she is not hypoxic or tachycardic. Continue current home regimen. Order back on her ARB  Diabetes mellitus type 2 Well-controlled continue sliding scale insulin  give her carb modified diet.  Chronic kidney disease stage IV: Creatinine appears to be at baseline resume diuretic therapy.  Persistent leukocytosis: She remain afebrile no cough or shortness of breath.  She will need  further evaluation as an outpatient by hematology.  Anemia of chronic renal disease: Hemoglobin is actually stable continue to monitor intermittently.  History of GI bleed on last admission, EGD was unremarkable, no signs of overt bleeding she denies any melanotic stools. Continue Protonix  .  Paroxysmal atrial fibrillation/history of PE: Continue Eliquis .  Venous stasis dermatitis: Continue wound care.  PAD/elevated troponins: She denies any chest pain. Cardiac biomarkers have basically remained flat.  Obesity, Class III, BMI 40-49.9 (morbid obesity) (HCC) Noted   DVT prophylaxis: eliquis  Family Communication:husband Status is: Observation The patient remains OBS appropriate and will d/c before 2 midnights.    Code Status:     Code Status Orders  (From admission, onward)           Start     Ordered   04/10/24 2049  Do not attempt resuscitation (DNR)- Limited -Do Not Intubate (DNI)  Continuous       Question Answer Comment  If pulseless and not breathing No CPR or chest compressions.   In Pre-Arrest Conditions (Patient Is Breathing and Has A Pulse) Do not intubate. Provide all appropriate non-invasive medical interventions. Avoid ICU transfer unless indicated or required.   Consent: Discussion documented in EHR or advanced directives reviewed      04/10/24 2049           Code Status History     Date Active Date Inactive Code Status Order ID Comments User Context   03/29/2024 0002 04/04/2024 1520 Limited: Do not attempt resuscitation (DNR) -DNR-LIMITED -Do Not Intubate/DNI  [id number]  Silvester Ales, MD ED   12/28/2021  2302 01/03/2022 2259 DNR [id number]  Manfred Driver, DO Inpatient   05/01/2021 0004 05/06/2021 1902 DNR [id number]  Zachary Evalene RAMAN, NP Inpatient   10/09/2019 1759 10/11/2019 2038 DNR [id number]  Lue Elsie BROCKS, MD Inpatient   10/09/2019 1505 10/09/2019 1759 Full Code [id number]  Will Almarie MATSU, MD ED   11/03/2018 1835 11/07/2018 1906 DNR  [id number]  Briana Elgin LABOR, MD Inpatient         IV Access:   Peripheral IV   Procedures and diagnostic studies:   CT Head Wo Contrast Result Date: 04/10/2024 CLINICAL DATA:  Neuro deficit, acute, stroke suspected Extremity weakness, Neuro deficit, acute, stroke suspected EXAM: CT HEAD WITHOUT CONTRAST TECHNIQUE: Contiguous axial images were obtained from the base of the skull through the vertex without intravenous contrast. RADIATION DOSE REDUCTION: This exam was performed according to the departmental dose-optimization program which includes automated exposure control, adjustment of the mA and/or kV according to patient size and/or use of iterative reconstruction technique. COMPARISON:  None Available. FINDINGS: Brain: Patchy and confluent areas of decreased attenuation are noted throughout the deep and periventricular white matter of the cerebral hemispheres bilaterally, compatible with chronic microvascular ischemic disease. No evidence of large-territorial acute infarction. No parenchymal hemorrhage. No mass lesion. No extra-axial collection. No mass effect or midline shift. No hydrocephalus. Basilar cisterns are patent. Vascular: No hyperdense vessel. Atherosclerotic calcifications are present within the cavernous internal carotid arteries. Skull: No acute fracture or focal lesion. Sinuses/Orbits: Paranasal sinuses and mastoid air cells are clear. Bilateral lens replacement. Otherwise the orbits are unremarkable. Other: None. IMPRESSION: No acute intracranial abnormality. Electronically Signed   By: Morgane  Naveau M.D.   On: 04/10/2024 19:42   DG Chest Portable 1 View Result Date: 04/10/2024 CLINICAL DATA:  Leg weakness. Difficulty getting up and down for 2 weeks. EXAM: PORTABLE CHEST 1 VIEW COMPARISON:  03/28/2024 FINDINGS: Shallow inspiration. Cardiac enlargement with pulmonary vascular congestion. Atelectasis in the lung bases. No edema or consolidation. Similar appearance to previous  study. No pleural effusion or pneumothorax. Calcified and tortuous aorta. Old right rib fractures. Degenerative changes in the spine and shoulders. IMPRESSION: Shallow inspiration with atelectasis in the lung bases. Cardiac enlargement with mild vascular congestion. No edema or consolidation. Similar appearance to previous study. Electronically Signed   By: Elsie Gravely M.D.   On: 04/10/2024 17:21     Medical Consultants:   None.   Subjective:    Pamela [name] she relates she has had bilateral lower extremity weakness for over 5 weeks, which has been gradual Objective:    Vitals:   04/11/24 0200 04/11/24 0258 04/11/24 0300 04/11/24 0400  BP: (!) 145/70  (!) 156/82 (!) 152/74  Pulse: 77  (!) 58 (!) 59  Resp: 20  16 (!) 27  Temp:  98 F (36.7 C)    TempSrc:      SpO2: 90%  92% 93%   SpO2: 93 %  No intake or output data in the 24 hours ending 04/11/24 0615 There were no vitals filed for this visit.  Exam: General exam: In no acute distress. Respiratory system: Good air movement and clear to auscultation. Cardiovascular system: S1 & S2 heard, RRR. No JVD.  Gastrointestinal system: Abdomen is nondistended, soft and nontender.  Central nervous system: Alert and oriented. No focal neurological deficits. Extremities: No pedal edema. Skin: No rashes, lesions or ulcers Psychiatry: Judgment and insight appear intact mood is appropriate, she is very nervous this morning.   Data Reviewed:  Labs: Basic Metabolic Panel: Recent Labs  Lab 04/10/24 1520 04/11/24 0452  NA 140 142  K 3.9 3.7  CL 103 103  CO2 27 30  GLUCOSE 86 90  BUN 36* 32*  CREATININE 1.82* 1.81*  CALCIUM  9.1 9.0  MG 2.4  --    GFR Estimated Creatinine Clearance: 25.6 mL/min (A) (by C-G formula based on SCr of 1.81 mg/dL (H)). Liver Function Tests: Recent Labs  Lab 04/10/24 1520  AST 19  ALT 14  ALKPHOS 65  BILITOT 0.6  PROT 6.2*  ALBUMIN  3.5   No results for input(s): LIPASE,  AMYLASE in the last 168 hours. No results for input(s): AMMONIA in the last 168 hours. Coagulation profile No results for input(s): INR, PROTIME in the last 168 hours. COVID-19 Labs  No results for input(s): DDIMER, FERRITIN, LDH, CRP in the last 72 hours.  Lab Results  Component Value Date   SARSCOV2NAA NEGATIVE 03/29/2024   SARSCOV2NAA NEGATIVE 12/28/2021   SARSCOV2NAA POSITIVE (A) 04/30/2021   SARSCOV2NAA NEGATIVE 10/09/2019    CBC: Recent Labs  Lab 04/10/24 1520 04/11/24 0452  WBC 12.8* 12.8*  NEUTROABS 9.9*  --   HGB 9.7* 10.1*  HCT 31.8* 32.3*  MCV 98.5 98.2  PLT 263 256   Cardiac Enzymes: No results for input(s): CKTOTAL, CKMB, CKMBINDEX, TROPONINI in the last 168 hours. BNP (last 3 results) Recent Labs    03/28/24 1753 04/10/24 1520  PROBNP 698.0* 953.0*   CBG: Recent Labs  Lab 04/04/24 0737 04/10/24 2313  GLUCAP 87 94   D-Dimer: No results for input(s): DDIMER in the last 72 hours. Hgb A1c: No results for input(s): HGBA1C in the last 72 hours. Lipid Profile: No results for input(s): CHOL, HDL, LDLCALC, TRIG, CHOLHDL, LDLDIRECT in the last 72 hours. Thyroid  function studies: Recent Labs    04/10/24 1934  TSH 2.000   Anemia work up: Recent Labs    04/10/24 1933  VITAMINB12 647   Sepsis Labs: Recent Labs  Lab 04/10/24 1520 04/11/24 0452  WBC 12.8* 12.8*   Microbiology No results found for this or any previous visit (from the past 240 hours).   Medications:    acidophilus  1 capsule Oral Daily   allopurinol  100 mg Oral Daily   apixaban   2.5 mg Oral BID   calcium  carbonate  1,250 mg Oral Q breakfast   dapagliflozin  propanediol  10 mg Oral Daily   ferrous sulfate  325 mg Oral Q breakfast   furosemide   20 mg Oral Daily   insulin  aspart  0-5 Units Subcutaneous QHS   insulin  aspart  0-9 Units Subcutaneous TID WC   LORazepam   1 mg Intravenous Once   metoprolol  succinate  25 mg Oral Daily    pantoprazole   40 mg Oral BID   pravastatin   10 mg Oral QHS   Continuous Infusions:    LOS: 0 days   Erle Odell Castor  [facility]  04/11/2024, 6:15 AM

## 2024-04-12 DIAGNOSIS — R531 Weakness: Secondary | ICD-10-CM | POA: Diagnosis not present

## 2024-04-12 DIAGNOSIS — I480 Paroxysmal atrial fibrillation: Secondary | ICD-10-CM | POA: Diagnosis not present

## 2024-04-12 DIAGNOSIS — N1832 Chronic kidney disease, stage 3b: Secondary | ICD-10-CM | POA: Diagnosis not present

## 2024-04-12 DIAGNOSIS — E66813 Obesity, class 3: Secondary | ICD-10-CM | POA: Diagnosis not present

## 2024-04-12 LAB — GLUCOSE, CAPILLARY
Glucose-Capillary: 89 mg/dL (ref 70–99)
Glucose-Capillary: 114 mg/dL — ABNORMAL HIGH (ref 70–99)
Glucose-Capillary: 133 mg/dL — ABNORMAL HIGH (ref 70–99)
Glucose-Capillary: 99 mg/dL (ref 70–99)

## 2024-04-12 NOTE — Plan of Care (Signed)

## 2024-04-12 NOTE — Plan of Care (Incomplete)

## 2024-04-12 NOTE — TOC Progression Note (Signed)
 Transition of Care [facility]) - Progression Note    Patient Details  Name: Pamela [name] MRN: [id number] Date of Birth: [birth year]  Transition of Care [facility] CM/SW Contact  [name], LCSW Phone Number: 04/12/2024, 1:47 PM  Clinical Narrative:    CSW visited pat, MOON delivered. Discussed SNF recommendation.  Pt declined stated that she does not want SNF. CSW assured her SNF is temporary and offers her the opportunity to get short term rehab more often during the week, asked that she reconsider. Pt agreed to think about it further. ICM following.    Expected Discharge Plan: Home w Home Health Services Barriers to Discharge: Continued Medical Work up               Expected Discharge Plan and Services                                               Social Drivers of Health (SDOH) Interventions SDOH Screenings   Food Insecurity: No Food Insecurity (04/11/2024)  Housing: Low Risk  (04/11/2024)  Transportation Needs: No Transportation Needs (04/11/2024)  Utilities: Not At Risk (04/11/2024)  Social Connections: Moderately Isolated (04/11/2024)  Tobacco Use: Medium Risk (04/10/2024)    Readmission Risk Interventions    04/04/2024    9:24 AM  Readmission Risk Prevention Plan  Transportation Screening Complete  HRI or Home Care Consult Complete  Social Work Consult for Recovery Care Planning/Counseling Complete  Palliative Care Screening Not Applicable  Medication Review Oceanographer) Complete

## 2024-04-12 NOTE — Care Management Obs Status (Signed)
 MEDICARE OBSERVATION STATUS NOTIFICATION   Patient Details  Name: Pamela [name] MRN: [id number] Date of Birth: 1935/04/05   Medicare Observation Status Notification Given:  Yes    Alvah Gilder I Duran Ohern, LCSW 04/12/2024, 10:31 AM

## 2024-04-12 NOTE — Progress Notes (Signed)
 [facility] PROGRESS NOTE    Progress Note  Pamela [name]  FMW:[id number] DOB: [birth year] DOA: 04/10/2024 PCP: [name], MD     Brief Narrative:   Pamela [name] is an 89 y.o. female past medical history of diabetes mellitus type 2, chronic kidney stage IV, anemia of chronic disease, A-fib PE on Eliquis , chronic diastolic heart failure with a last EF of 60%, she was recently discharged on 04/04/2024 for acute decompensated heart failure, comes into the ED with leg weakness and difficulty getting up for 2 weeks,   Assessment/Plan:   Bilateral lower extremity Weakness/ambulatory dysfunction: She has severe arthritis of the knees bilaterally. She relates she has had bilateral lower extremity weakness for the past 5 weeks which has been gradual. CT of the head showed no acute abnormalities. Evaluated the patient will need skilled nursing facility placement.  Chronic diastolic heart failure/essential hypertension: Chest x-ray is clear she is not hypoxic or tachycardic. Continue Farxiga , ARB Lasix  and metoprolol . She appears to be compensated.  Diabetes mellitus type 2 Continue Farxiga , blood glucose is well-controlled.  Chronic kidney disease stage IV: Creatinine appears to be at baseline.  Persistent leukocytosis: She remain afebrile no cough or shortness of breath.  She will need further evaluation as an outpatient by hematology.  Anemia of chronic renal disease: Hemoglobin is actually stable continue to monitor intermittently. Follow-up with hematology as an outpatient.  History of GI bleed on last admission, EGD was unremarkable, no signs of overt bleeding she denies any melanotic stools. Continue Protonix  .  Paroxysmal atrial fibrillation/history of PE: Continue Eliquis .  Rate controlled metoprolol .  Venous stasis dermatitis: Continue wound care.  PAD/elevated troponins: She denies any chest pain. Cardiac biomarkers have basically remained  flat.  Obesity, Class III, BMI 40-49.9 (morbid obesity) (HCC) Noted   DVT prophylaxis: eliquis  Family Communication:husband Status is: Observation The patient remains OBS appropriate and will d/c before 2 midnights.    Code Status:     Code Status Orders  (From admission, onward)           Start     Ordered   04/10/24 2049  Do not attempt resuscitation (DNR)- Limited -Do Not Intubate (DNI)  Continuous       Question Answer Comment  If pulseless and not breathing No CPR or chest compressions.   In Pre-Arrest Conditions (Patient Is Breathing and Has A Pulse) Do not intubate. Provide all appropriate non-invasive medical interventions. Avoid ICU transfer unless indicated or required.   Consent: Discussion documented in EHR or advanced directives reviewed      04/10/24 2049           Code Status History     Date Active Date Inactive Code Status Order ID Comments User Context   03/29/2024 0002 04/04/2024 1520 Limited: Do not attempt resuscitation (DNR) -DNR-LIMITED -Do Not Intubate/DNI  [id number]  Silvester Ales, MD ED   12/28/2021 2302 01/03/2022 2259 DNR [id number]  Manfred Driver, DO Inpatient   05/01/2021 0004 05/06/2021 1902 DNR [id number]  Zachary Evalene RAMAN, NP Inpatient   10/09/2019 1759 10/11/2019 2038 DNR [id number]  Lue Elsie BROCKS, MD Inpatient   10/09/2019 1505 10/09/2019 1759 Full Code [id number]  Will Almarie MATSU, MD ED   11/03/2018 1835 11/07/2018 1906 DNR [id number]  Briana Elgin LABOR, MD Inpatient         IV Access:   Peripheral IV   Procedures and diagnostic studies:   CT Head Wo Contrast Result Date: 04/10/2024 CLINICAL DATA:  Neuro deficit, acute,  stroke suspected Extremity weakness, Neuro deficit, acute, stroke suspected EXAM: CT HEAD WITHOUT CONTRAST TECHNIQUE: Contiguous axial images were obtained from the base of the skull through the vertex without intravenous contrast. RADIATION DOSE REDUCTION: This exam was performed according to the  departmental dose-optimization program which includes automated exposure control, adjustment of the mA and/or kV according to patient size and/or use of iterative reconstruction technique. COMPARISON:  None Available. FINDINGS: Brain: Patchy and confluent areas of decreased attenuation are noted throughout the deep and periventricular white matter of the cerebral hemispheres bilaterally, compatible with chronic microvascular ischemic disease. No evidence of large-territorial acute infarction. No parenchymal hemorrhage. No mass lesion. No extra-axial collection. No mass effect or midline shift. No hydrocephalus. Basilar cisterns are patent. Vascular: No hyperdense vessel. Atherosclerotic calcifications are present within the cavernous internal carotid arteries. Skull: No acute fracture or focal lesion. Sinuses/Orbits: Paranasal sinuses and mastoid air cells are clear. Bilateral lens replacement. Otherwise the orbits are unremarkable. Other: None. IMPRESSION: No acute intracranial abnormality. Electronically Signed   By: Morgane  Naveau M.D.   On: 04/10/2024 19:42   DG Chest Portable 1 View Result Date: 04/10/2024 CLINICAL DATA:  Leg weakness. Difficulty getting up and down for 2 weeks. EXAM: PORTABLE CHEST 1 VIEW COMPARISON:  03/28/2024 FINDINGS: Shallow inspiration. Cardiac enlargement with pulmonary vascular congestion. Atelectasis in the lung bases. No edema or consolidation. Similar appearance to previous study. No pleural effusion or pneumothorax. Calcified and tortuous aorta. Old right rib fractures. Degenerative changes in the spine and shoulders. IMPRESSION: Shallow inspiration with atelectasis in the lung bases. Cardiac enlargement with mild vascular congestion. No edema or consolidation. Similar appearance to previous study. Electronically Signed   By: Elsie Gravely M.D.   On: 04/10/2024 17:21     Medical Consultants:   None.   Subjective:    Pamela [name] she feels much better this  morning. Objective:    Vitals:   04/11/24 1750 04/11/24 2038 04/12/24 0032 04/12/24 0524  BP: (!) 147/55 (!) 147/65 (!) 135/59 (!) 145/58  Pulse: 60 60 (!) 58 61  Resp:  16 18 14   Temp: 98 F (36.7 C) 98.5 F (36.9 C) (!) 97.5 F (36.4 C) 98 F (36.7 C)  TempSrc: Oral Oral    SpO2: 97% 99% 98% 99%   SpO2: 99 %   Intake/Output Summary (Last 24 hours) at 04/12/2024 1059 Last data filed at 04/12/2024 0553 Gross per 24 hour  Intake 480 ml  Output 900 ml  Net -420 ml   There were no vitals filed for this visit.  Exam: General exam: In no acute distress. Respiratory system: Good air movement and clear to auscultation. Cardiovascular system: S1 & S2 heard, RRR. No JVD. Gastrointestinal system: Abdomen is nondistended, soft and nontender.  Extremities: No pedal edema. Skin: No rashes, lesions or ulcers Psychiatry: Judgement and insight appear normal. Mood & affect appropriate.  Data Reviewed:    Labs: Basic Metabolic Panel: Recent Labs  Lab 04/10/24 1520 04/11/24 0452  NA 140 142  K 3.9 3.7  CL 103 103  CO2 27 30  GLUCOSE 86 90  BUN 36* 32*  CREATININE 1.82* 1.81*  CALCIUM  9.1 9.0  MG 2.4  --    GFR Estimated Creatinine Clearance: 25.6 mL/min (A) (by C-G formula based on SCr of 1.81 mg/dL (H)). Liver Function Tests: Recent Labs  Lab 04/10/24 1520  AST 19  ALT 14  ALKPHOS 65  BILITOT 0.6  PROT 6.2*  ALBUMIN  3.5   No results  for input(s): LIPASE, AMYLASE in the last 168 hours. No results for input(s): AMMONIA in the last 168 hours. Coagulation profile No results for input(s): INR, PROTIME in the last 168 hours. COVID-19 Labs  No results for input(s): DDIMER, FERRITIN, LDH, CRP in the last 72 hours.  Lab Results  Component Value Date   SARSCOV2NAA NEGATIVE 03/29/2024   SARSCOV2NAA NEGATIVE 12/28/2021   SARSCOV2NAA POSITIVE (A) 04/30/2021   SARSCOV2NAA NEGATIVE 10/09/2019    CBC: Recent Labs  Lab 04/10/24 1520  04/11/24 0452  WBC 12.8* 12.8*  NEUTROABS 9.9*  --   HGB 9.7* 10.1*  HCT 31.8* 32.3*  MCV 98.5 98.2  PLT 263 256   Cardiac Enzymes: No results for input(s): CKTOTAL, CKMB, CKMBINDEX, TROPONINI in the last 168 hours. BNP (last 3 results) Recent Labs    03/28/24 1753 04/10/24 1520  PROBNP 698.0* 953.0*   CBG: Recent Labs  Lab 04/11/24 0851 04/11/24 1309 04/11/24 1654 04/11/24 2142 04/12/24 0747  GLUCAP 90 112* 88 95 89   D-Dimer: No results for input(s): DDIMER in the last 72 hours. Hgb A1c: No results for input(s): HGBA1C in the last 72 hours. Lipid Profile: No results for input(s): CHOL, HDL, LDLCALC, TRIG, CHOLHDL, LDLDIRECT in the last 72 hours. Thyroid  function studies: Recent Labs    04/10/24 1934  TSH 2.000   Anemia work up: Recent Labs    04/10/24 1933  VITAMINB12 647   Sepsis Labs: Recent Labs  Lab 04/10/24 1520 04/11/24 0452  WBC 12.8* 12.8*   Microbiology No results found for this or any previous visit (from the past 240 hours).   Medications:    acidophilus  1 capsule Oral Daily   allopurinol  100 mg Oral Daily   apixaban   2.5 mg Oral BID   calcium  carbonate  1,250 mg Oral Q breakfast   dapagliflozin  propanediol  10 mg Oral Daily   ferrous sulfate  325 mg Oral Q breakfast   furosemide   20 mg Oral Daily   insulin  aspart  0-5 Units Subcutaneous QHS   insulin  aspart  0-9 Units Subcutaneous TID WC   LORazepam   1 mg Intravenous Once   losartan   25 mg Oral Daily   metoprolol  succinate  25 mg Oral Daily   pantoprazole   40 mg Oral BID   pravastatin   10 mg Oral QHS   thiamine  100 mg Oral Daily   Continuous Infusions:    LOS: 0 days   Pamela [name]  [facility]  04/12/2024, 10:59 AM

## 2024-04-12 NOTE — Progress Notes (Signed)
 Occupational Therapy Treatment Patient Details Name: Pamela [name] MRN: [id number] DOB: 11/02/1934 Today's Date: 04/12/2024   History of present illness 89 yo female presents to ED 04/10/24, just DC'd 10/2, with LE weakness, inability to walk. H/O fall several weeks prior. PMH : HF, arthritis, HTN, DM II, CKD IIIb, HLD and PE, GIB, gout, PAF, venous stasis dermatitis, anemia   OT comments  Patient seen for skilled OT session this am. Significant progress with overall bed and STS with RW mobility this visit from regular bed and basic UE ADL's. Patient continues to require cues for relaxation due to fear of falling with +2 support beneficial. Now on RA and educated on breathing integration. Patient requires continued Acute care hospital level OT services to progress safety and functional performance and allow for discharge. Patient will benefit from continued inpatient follow up therapy, <3 hours/day.         If plan is discharge home, recommend the following:  Two people to help with walking and/or transfers;A lot of help with bathing/dressing/bathroom;Assistance with cooking/housework;Assistance with feeding;Direct supervision/assist for medications management;Direct supervision/assist for financial management;Assist for transportation;Help with stairs or ramp for entrance   Equipment Recommendations  None recommended by OT       Precautions / Restrictions Precautions Precautions: Fall Precaution/Restrictions Comments: monitor sats Restrictions Weight Bearing Restrictions Per Provider Order: No       Mobility Bed Mobility Overal bed mobility: Needs Assistance Bed Mobility: Supine to Sit, Sit to Supine     Supine to sit: HOB elevated, Used rails, Mod assist Sit to supine: Max assist, +2 for physical assistance, +2 for safety/equipment, HOB elevated, Used rails        Transfers Overall transfer level: Needs assistance Equipment used: Rolling walker (2 wheels) Transfers: Sit  to/from Stand Sit to Stand: Mod assist, Min assist           General transfer comment: patient now in standard bed and able to move from STS and sidestep laterally     Balance Overall balance assessment: Needs assistance Sitting-balance support: Feet unsupported, No upper extremity supported Sitting balance-Leahy Scale: Good     Standing balance support: Bilateral upper extremity supported, During functional activity, Reliant on assistive device for balance Standing balance-Leahy Scale: Poor                             ADL either performed or assessed with clinical judgement   ADL Overall ADL's : Needs assistance/impaired                 Upper Body Dressing : Minimal assistance;Sitting Upper Body Dressing Details (indicate cue type and reason): EOB for gown                 Functional mobility during ADLs: +2 for physical assistance;+2 for safety/equipment;Minimal assistance (STS and side stepping with RW) General ADL Comments: cues for breathing integration and relaxation due to fear of falling    Extremity/Trunk Assessment Upper Extremity Assessment Upper Extremity Assessment: Generalized weakness RUE Deficits / Details: tremor RUE Coordination: decreased fine motor LUE Deficits / Details: tremor LUE Coordination: decreased fine motor   Lower Extremity Assessment Lower Extremity Assessment: Defer to PT evaluation        Vision   Vision Assessment?: No apparent visual deficits         Communication Communication Communication: No apparent difficulties   Cognition Arousal: Alert Behavior During Therapy: Anxious Cognition: No apparent impairments  Following commands: Intact        Cueing   Cueing Techniques: Verbal cues        General Comments on RA 90-91% reports of dizziness with first standing bout and Bp 133/69 (58 PR)    Pertinent Vitals/ Pain       Pain Assessment Pain  Assessment: No/denies pain   Frequency  Min 2X/week        Progress Toward Goals  OT Goals(current goals can now be found in the care plan section)  Progress towards OT goals: Progressing toward goals  Acute Rehab OT Goals Patient Stated Goal: to keep progressing OT Goal Formulation: With patient/family Time For Goal Achievement: 04/25/24 Potential to Achieve Goals: Fair ADL Goals Pt Will Perform Lower Body Bathing: with min assist;with adaptive equipment;sitting/lateral leans Pt Will Perform Lower Body Dressing: with min assist;with adaptive equipment;sitting/lateral leans Pt Will Transfer to Toilet: with +2 assist;with mod assist;bedside commode Pt Will Perform Toileting - Clothing Manipulation and hygiene: sitting/lateral leans;with min assist;with adaptive equipment Pt/caregiver will Perform Home Exercise Program: Increased strength;Both right and left upper extremity;With Supervision;With written HEP provided Additional ADL Goal #1: Pt will verbalize and demonstrate at least 2 methods to manage anxiety prior to mobilizing being deep breathing, medication, or use of sart phone for guided visualization in order to increase safety and tolerance with functional mobility. Additional ADL Goal #2: Pt will improve sitting balance to good static and fair+ dynamic and tolerate 20 min of EOB sitting while engaging in light UB ADLs without physical assistance other than setup in order to increase safety and participation during seated ADLs.  Plan      Co-evaluation    PT/OT/SLP Co-Evaluation/Treatment: Yes Reason for Co-Treatment: For patient/therapist safety PT goals addressed during session: Mobility/safety with mobility;Balance OT goals addressed during session: ADL's and self-care;Proper use of Adaptive equipment and DME      AM-PAC OT 6 Clicks Daily Activity     Outcome Measure   Help from another person eating meals?: A Little Help from another person taking care of  personal grooming?: A Little Help from another person toileting, which includes using toliet, bedpan, or urinal?: Total Help from another person bathing (including washing, rinsing, drying)?: A Lot Help from another person to put on and taking off regular upper body clothing?: A Lot Help from another person to put on and taking off regular lower body clothing?: Total 6 Click Score: 12    End of Session Equipment Utilized During Treatment: Gait belt;Rolling walker (2 wheels)  OT Visit Diagnosis: Unsteadiness on feet (R26.81);Muscle weakness (generalized) (M62.81)   Activity Tolerance Patient limited by fatigue   Patient Left in bed;with bed alarm set;with call bell/phone within reach;with family/visitor present   Nurse Communication Mobility status        Time: 8944-8879 OT Time Calculation (min): 25 min  Charges: OT General Charges $OT Visit: 1 Visit  Shahzain Kiester OT/L Acute Rehabilitation Department  [phone]  04/12/2024, 12:05 PM

## 2024-04-12 NOTE — Progress Notes (Signed)
 Physical Therapy Treatment Patient Details Name: Pamela [name] MRN: [id number] DOB: 05-Mar-1935 Today's Date: 04/12/2024   History of Present Illness 89 yo female presents to ED 04/10/24, just DC'd 10/2, with LE weakness, inability to walk. H/O fall several weeks prior. PMH : HF, arthritis, HTN, DM II, CKD IIIb, HLD and PE, GIB, gout, PAF, venous stasis dermatitis, anemia    PT Comments   Pt admitted with above diagnosis.  Pt currently with functional limitations due to the deficits listed below (see PT Problem List).  Pt in bed when therapist arrived. Pt agreeable to therapy intervention. Pt required increased time, use of hospital bed and CGA for supine to sit, sit to stand  from EOB with pt progressing to min A and pt able to side step 3 feet with RW 2 bouts and min A x 2, mod A x 2 to return to bed. Pt left in bed, all needs in place and spouse present. MD entered room during intervention and provided pt and family ed on heart failure, fluid overload and need for SNF for short term rehab following hospitalization. Pt and spouse agreeable to continued inpatient follow up therapy, <3 hours/day. Pt will benefit from acute skilled PT to increase their independence and safety with mobility to allow discharge.      If plan is discharge home, recommend the following: Two people to help with walking and/or transfers;A lot of help with bathing/dressing/bathroom;Assistance with cooking/housework;Assist for transportation;Help with stairs or ramp for entrance   Can travel by private vehicle     No  Equipment Recommendations  None recommended by PT    Recommendations for Other Services       Precautions / Restrictions Precautions Precautions: Fall Precaution/Restrictions Comments: monitor sats Restrictions Weight Bearing Restrictions Per Provider Order: No     Mobility  Bed Mobility Overal bed mobility: Needs Assistance Bed Mobility: Sit to Supine, Supine to Sit     Supine to sit: HOB  elevated, Used rails, Contact guard Sit to supine: +2 for physical assistance, +2 for safety/equipment, Used rails, Mod assist   General bed mobility comments: pt required increased time, cues and encouragement with use of hospital bed for supine to sit, pt required mod A x 2 for safety, trunk and B LE management to return to bed. pt required total A to reposition in bed    Transfers Overall transfer level: Needs assistance Equipment used: Rolling walker (2 wheels) Transfers: Sit to/from Stand Sit to Stand: Min assist, +2 physical assistance, +2 safety/equipment           General transfer comment: pt now in standard hospital bed and demonstrated improved abiltiy to perform sit to stand wtih mod A x 2 and progressing to min A with multimodal cues for power up and extension posture, pt able to maintain static standing for 10s and perform 3 x sit to stand    Ambulation/Gait               General Gait Details: pt able to perform 2 side stepping bouts to the R side with min A x 2 for safety, cues, RW and encouragement for a total of 3 feet, pt exhibited difficulty lifting and placing B LE with pt electing to slide feet to the side   Stairs             Wheelchair Mobility     Tilt Bed    Modified Rankin (Stroke Patients Only)       Balance Overall  balance assessment: Needs assistance Sitting-balance support: Feet unsupported, No upper extremity supported Sitting balance-Leahy Scale: Good     Standing balance support: Bilateral upper extremity supported, During functional activity, Reliant on assistive device for balance Standing balance-Leahy Scale: Poor                              Communication Communication Communication: No apparent difficulties  Cognition Arousal: Alert Behavior During Therapy: Anxious   PT - Cognitive impairments: No apparent impairments                         Following commands: Intact      Cueing Cueing  Techniques: Verbal cues  Exercises      General Comments General comments (skin integrity, edema, etc.): on RA 90-91% reports of dizziness with first standing bout and Bp 133/69 (58 PR)      Pertinent Vitals/Pain Pain Assessment Pain Assessment: No/denies pain    Home Living                          Prior Function            PT Goals (current goals can now be found in the care plan section) Acute Rehab PT Goals Patient Stated Goal: go home, stand up PT Goal Formulation: With patient/family Time For Goal Achievement: 04/25/24 Potential to Achieve Goals: Fair Progress towards PT goals: Progressing toward goals    Frequency    Min 2X/week      PT Plan      Co-evaluation PT/OT/SLP Co-Evaluation/Treatment: Yes Reason for Co-Treatment: For patient/therapist safety PT goals addressed during session: Mobility/safety with mobility;Balance OT goals addressed during session: ADL's and self-care;Proper use of Adaptive equipment and DME      AM-PAC PT 6 Clicks Mobility   Outcome Measure  Help needed turning from your back to your side while in a flat bed without using bedrails?: A Lot Help needed moving from lying on your back to sitting on the side of a flat bed without using bedrails?: A Lot Help needed moving to and from a bed to a chair (including a wheelchair)?: A Lot Help needed standing up from a chair using your arms (e.g., wheelchair or bedside chair)?: A Lot Help needed to walk in hospital room?: Total Help needed climbing 3-5 steps with a railing? : Total 6 Click Score: 10    End of Session Equipment Utilized During Treatment: Gait belt Activity Tolerance: Patient tolerated treatment well Patient left: in bed;with call bell/phone within reach;with family/visitor present Nurse Communication: Mobility status;Need for lift equipment PT Visit Diagnosis: Unsteadiness on feet (R26.81);Other abnormalities of gait and mobility (R26.89);Repeated falls  (R29.6);Muscle weakness (generalized) (M62.81);Pain Pain - Right/Left: Right Pain - part of body: Knee     Time: 8946-8878 PT Time Calculation (min) (ACUTE ONLY): 28 min  Charges:    $Therapeutic Activity: 8-22 mins PT General Charges $$ ACUTE PT VISIT: 1 Visit                     [place], PT Acute Rehab    [place] VEAR Drone 04/12/2024, 11:34 AM

## 2024-04-13 DIAGNOSIS — N1832 Chronic kidney disease, stage 3b: Secondary | ICD-10-CM | POA: Diagnosis not present

## 2024-04-13 DIAGNOSIS — R531 Weakness: Secondary | ICD-10-CM | POA: Diagnosis not present

## 2024-04-13 DIAGNOSIS — E66813 Obesity, class 3: Secondary | ICD-10-CM | POA: Diagnosis not present

## 2024-04-13 DIAGNOSIS — I480 Paroxysmal atrial fibrillation: Secondary | ICD-10-CM | POA: Diagnosis not present

## 2024-04-13 LAB — VITAMIN B1: Vitamin B1 (Thiamine): 157.1 nmol/L (ref 66.5–200.0)

## 2024-04-13 LAB — GLUCOSE, CAPILLARY
Glucose-Capillary: 98 mg/dL (ref 70–99)
Glucose-Capillary: 119 mg/dL — ABNORMAL HIGH (ref 70–99)
Glucose-Capillary: 182 mg/dL — ABNORMAL HIGH (ref 70–99)
Glucose-Capillary: 100 mg/dL — ABNORMAL HIGH (ref 70–99)

## 2024-04-13 MED ORDER — LOSARTAN POTASSIUM 50 MG PO TABS
50.0000 mg | ORAL_TABLET | Freq: Every day | ORAL | Status: DC
  Administered 2024-04-14: 50 mg via ORAL
  Filled 2024-04-13 (×2): qty 1

## 2024-04-13 NOTE — Progress Notes (Signed)
 Mobility Specialist - Progress Note   04/13/24 1357  Mobility  Activity Ambulated with assistance;Stood at bedside  Level of Assistance +2 (takes two people)  Press photographer wheel walker  Distance Ambulated (ft) 3 ft  Range of Motion/Exercises Active Assistive  Activity Response Tolerated well  Mobility Referral Yes  Mobility visit 1 Mobility  Mobility Specialist Start Time (ACUTE ONLY) 1345  Mobility Specialist Stop Time (ACUTE ONLY) 1357  Mobility Specialist Time Calculation (min) (ACUTE ONLY) 12 min   Pt was found in bed and agreeable to mobilize. Pt stated feeling shaky and c/o dizziness when sitting EOB. Stated dizziness getting better after ~7min. Did x3 sit>stands and took lateral steps along the EOB. At EOS was left in bed with all needs met. Call bell in reach and husband in room.   Erminio Leos,  Mobility Specialist Can be reached via Secure Chat

## 2024-04-13 NOTE — Plan of Care (Signed)

## 2024-04-13 NOTE — Plan of Care (Signed)
  Problem: Education: Goal: Ability to describe self-care measures that may prevent or decrease complications (Diabetes Survival Skills Education) will improve Outcome: Progressing Goal: Individualized Educational Video(s) Outcome: Progressing   Problem: Fluid Volume: Goal: Ability to maintain a balanced intake and output will improve Outcome: Progressing   Problem: Health Behavior/Discharge Planning: Goal: Ability to identify and utilize available resources and services will improve Outcome: Progressing

## 2024-04-13 NOTE — Progress Notes (Signed)
 [facility] PROGRESS NOTE    Progress Note  Pamela [name]  FMW:[id number] DOB: August 11, 1934 DOA: 04/10/2024 PCP: [name], MD     Brief Narrative:   Pamela [name] is an 89 y.o. female past medical history of diabetes mellitus type 2, chronic kidney stage IV, anemia of chronic disease, A-fib PE on Eliquis , chronic diastolic heart failure with a last EF of 60%, she was recently discharged on 04/04/2024 for acute decompensated heart failure, comes into the ED with leg weakness and difficulty getting up for 2 weeks,   Assessment/Plan:   Bilateral lower extremity Weakness/ambulatory dysfunction: She has severe arthritis of the knees bilaterally. She relates she has had bilateral lower extremity weakness for the past 5 weeks which has been gradual. CT of the head showed no acute abnormalities. PT evaluated the patient the patient has agreed to go to skilled nursing facility.  Chronic diastolic heart failure/essential hypertension: Chest x-ray is clear she is not hypoxic or tachycardic. Continue Farxiga , ARB Lasix  and metoprolol . She appears to be compensated. Bp is elevated increase losartan .  Diabetes mellitus type 2 Continue Farxiga , blood glucose is well-controlled.  Chronic kidney disease stage IV: Creatinine appears to be at baseline.  Persistent leukocytosis: She remain afebrile no cough or shortness of breath.   She will need further evaluation as an outpatient by hematology.  Anemia of chronic renal disease: Hemoglobin is actually stable continue to monitor intermittently. Follow-up with hematology as an outpatient.  History of GI bleed on last admission, EGD was unremarkable, no signs of overt bleeding she denies any melanotic stools. Continue Protonix  .  Paroxysmal atrial fibrillation/history of PE: Continue Eliquis .  Rate controlled metoprolol .  Venous stasis dermatitis: Continue wound care.  PAD/elevated troponins: She denies any chest  pain. Cardiac biomarkers have basically remained flat.  Obesity, Class III, BMI 40-49.9 (morbid obesity) (HCC) Noted   DVT prophylaxis: eliquis  Family Communication:husband Status is: Observation The patient remains OBS appropriate and will d/c before 2 midnights.    Code Status:     Code Status Orders  (From admission, onward)           Start     Ordered   04/10/24 2049  Do not attempt resuscitation (DNR)- Limited -Do Not Intubate (DNI)  Continuous       Question Answer Comment  If pulseless and not breathing No CPR or chest compressions.   In Pre-Arrest Conditions (Patient Is Breathing and Has A Pulse) Do not intubate. Provide all appropriate non-invasive medical interventions. Avoid ICU transfer unless indicated or required.   Consent: Discussion documented in EHR or advanced directives reviewed      04/10/24 2049           Code Status History     Date Active Date Inactive Code Status Order ID Comments User Context   03/29/2024 0002 04/04/2024 1520 Limited: Do not attempt resuscitation (DNR) -DNR-LIMITED -Do Not Intubate/DNI  [id number]  Silvester Ales, MD ED   12/28/2021 2302 01/03/2022 2259 DNR [id number]  Manfred Driver, DO Inpatient   05/01/2021 0004 05/06/2021 1902 DNR [id number]  Zachary Evalene RAMAN, NP Inpatient   10/09/2019 1759 10/11/2019 2038 DNR [id number]  Lue Elsie BROCKS, MD Inpatient   10/09/2019 1505 10/09/2019 1759 Full Code [id number]  Will Almarie MATSU, MD ED   11/03/2018 1835 11/07/2018 1906 DNR [id number]  Briana Elgin LABOR, MD Inpatient         IV Access:   Peripheral IV   Procedures and diagnostic studies:   No results  found.    Medical Consultants:   None.   Subjective:    Pamela [name] does not want to go on the BiPAP and she was to go to the bedside commode. Objective:    Vitals:   04/12/24 0524 04/12/24 1510 04/12/24 1958 04/13/24 0440  BP: (!) 145/58 (!) 145/55 (!) 143/60 (!) 152/67  Pulse: 61 67 64 64  Resp: 14  17 18    Temp: 98 F (36.7 C) 97.8 F (36.6 C) 99 F (37.2 C) 98.2 F (36.8 C)  TempSrc:      SpO2: 99% 93% (!) 84% 100%   SpO2: 100 %   Intake/Output Summary (Last 24 hours) at 04/13/2024 1101 Last data filed at 04/13/2024 0915 Gross per 24 hour  Intake 840 ml  Output 825 ml  Net 15 ml   There were no vitals filed for this visit.  Exam: General exam: In no acute distress. Respiratory system: Good air movement and clear to auscultation. Cardiovascular system: S1 & S2 heard, RRR. No JVD. Gastrointestinal system: Abdomen is nondistended, soft and nontender.  Extremities: No pedal edema. Skin: No rashes, lesions or ulcers Psychiatry: Judgement and insight appear normal. Mood & affect appropriate. Data Reviewed:    Labs: Basic Metabolic Panel: Recent Labs  Lab 04/10/24 1520 04/11/24 0452  NA 140 142  K 3.9 3.7  CL 103 103  CO2 27 30  GLUCOSE 86 90  BUN 36* 32*  CREATININE 1.82* 1.81*  CALCIUM  9.1 9.0  MG 2.4  --    GFR Estimated Creatinine Clearance: 25.6 mL/min (A) (by C-G formula based on SCr of 1.81 mg/dL (H)). Liver Function Tests: Recent Labs  Lab 04/10/24 1520  AST 19  ALT 14  ALKPHOS 65  BILITOT 0.6  PROT 6.2*  ALBUMIN  3.5   No results for input(s): LIPASE, AMYLASE in the last 168 hours. No results for input(s): AMMONIA in the last 168 hours. Coagulation profile No results for input(s): INR, PROTIME in the last 168 hours. COVID-19 Labs  No results for input(s): DDIMER, FERRITIN, LDH, CRP in the last 72 hours.  Lab Results  Component Value Date   SARSCOV2NAA NEGATIVE 03/29/2024   SARSCOV2NAA NEGATIVE 12/28/2021   SARSCOV2NAA POSITIVE (A) 04/30/2021   SARSCOV2NAA NEGATIVE 10/09/2019    CBC: Recent Labs  Lab 04/10/24 1520 04/11/24 0452  WBC 12.8* 12.8*  NEUTROABS 9.9*  --   HGB 9.7* 10.1*  HCT 31.8* 32.3*  MCV 98.5 98.2  PLT 263 256   Cardiac Enzymes: No results for input(s): CKTOTAL, CKMB, CKMBINDEX,  TROPONINI in the last 168 hours. BNP (last 3 results) Recent Labs    03/28/24 1753 04/10/24 1520  PROBNP 698.0* 953.0*   CBG: Recent Labs  Lab 04/12/24 0747 04/12/24 1209 04/12/24 1709 04/12/24 2152 04/13/24 0806  GLUCAP 89 114* 133* 99 98   D-Dimer: No results for input(s): DDIMER in the last 72 hours. Hgb A1c: No results for input(s): HGBA1C in the last 72 hours. Lipid Profile: No results for input(s): CHOL, HDL, LDLCALC, TRIG, CHOLHDL, LDLDIRECT in the last 72 hours. Thyroid  function studies: Recent Labs    04/10/24 1934  TSH 2.000   Anemia work up: Recent Labs    04/10/24 1933  VITAMINB12 647   Sepsis Labs: Recent Labs  Lab 04/10/24 1520 04/11/24 0452  WBC 12.8* 12.8*   Microbiology No results found for this or any previous visit (from the past 240 hours).   Medications:    acidophilus  1 capsule Oral Daily  allopurinol  100 mg Oral Daily   apixaban   2.5 mg Oral BID   calcium  carbonate  1,250 mg Oral Q breakfast   dapagliflozin  propanediol  10 mg Oral Daily   ferrous sulfate  325 mg Oral Q breakfast   furosemide   20 mg Oral Daily   insulin  aspart  0-5 Units Subcutaneous QHS   insulin  aspart  0-9 Units Subcutaneous TID WC   LORazepam   1 mg Intravenous Once   losartan   25 mg Oral Daily   metoprolol  succinate  25 mg Oral Daily   pantoprazole   40 mg Oral BID   pravastatin   10 mg Oral QHS   thiamine  100 mg Oral Daily   Continuous Infusions:    LOS: 0 days   Pamela [name]  [facility]  04/13/2024, 11:01 AM

## 2024-04-14 DIAGNOSIS — I480 Paroxysmal atrial fibrillation: Secondary | ICD-10-CM | POA: Diagnosis not present

## 2024-04-14 DIAGNOSIS — R531 Weakness: Secondary | ICD-10-CM | POA: Diagnosis not present

## 2024-04-14 DIAGNOSIS — N1832 Chronic kidney disease, stage 3b: Secondary | ICD-10-CM | POA: Diagnosis not present

## 2024-04-14 DIAGNOSIS — E66813 Obesity, class 3: Secondary | ICD-10-CM | POA: Diagnosis not present

## 2024-04-14 LAB — BASIC METABOLIC PANEL WITH GFR
Anion gap: 9 (ref 5–15)
BUN: 32 mg/dL — ABNORMAL HIGH (ref 8–23)
CO2: 32 mmol/L (ref 22–32)
Calcium: 8.7 mg/dL — ABNORMAL LOW (ref 8.9–10.3)
Chloride: 101 mmol/L (ref 98–111)
Creatinine, Ser: 1.96 mg/dL — ABNORMAL HIGH (ref 0.44–1.00)
GFR, Estimated: 24 mL/min — ABNORMAL LOW (ref >60)
Glucose, Bld: 95 mg/dL (ref 70–99)
Potassium: 3.6 mmol/L (ref 3.5–5.1)
Sodium: 141 mmol/L (ref 135–145)

## 2024-04-14 LAB — GLUCOSE, CAPILLARY
Glucose-Capillary: 100 mg/dL — ABNORMAL HIGH (ref 70–99)
Glucose-Capillary: 143 mg/dL — ABNORMAL HIGH (ref 70–99)
Glucose-Capillary: 140 mg/dL — ABNORMAL HIGH (ref 70–99)
Glucose-Capillary: 135 mg/dL — ABNORMAL HIGH (ref 70–99)

## 2024-04-14 NOTE — Plan of Care (Signed)
   Problem: Coping: Goal: Ability to adjust to condition or change in health will improve Outcome: Progressing   Problem: Fluid Volume: Goal: Ability to maintain a balanced intake and output will improve Outcome: Progressing   Problem: Health Behavior/Discharge Planning: Goal: Ability to identify and utilize available resources and services will improve Outcome: Progressing

## 2024-04-14 NOTE — Progress Notes (Signed)
 [facility] PROGRESS NOTE    Progress Note  Pamela [name]  FMW:[id number] DOB: 1934/09/02 DOA: 04/10/2024 PCP: [name], MD     Brief Narrative:   Pamela [name] is an 89 y.o. female past medical history of diabetes mellitus type 2, chronic kidney stage IV, anemia of chronic disease, A-fib PE on Eliquis , chronic diastolic heart failure with a last EF of 60%, she was recently discharged on 04/04/2024 for acute decompensated heart failure, comes into the ED with leg weakness and difficulty getting up for 2 weeks,   Assessment/Plan:   Bilateral lower extremity Weakness/ambulatory dysfunction: She has severe arthritis of the knees bilaterally. Medically stable to be transferred to skilled nursing facility, awaiting placement.  Chronic diastolic heart failure/essential hypertension: Chest x-ray is clear she is not hypoxic or tachycardic. Continue Farxiga , ARB  and metoprolol .  Hold Lasix  creatinine is climbing. She appears to be compensated. Blood pressure is well-controlled continue losartan .  Diabetes mellitus type 2 Continue Farxiga , blood glucose is well-controlled.  Chronic kidney disease stage IV: Creatinine appears to be at baseline.  Persistent leukocytosis: She remain afebrile no cough or shortness of breath.   She will need further evaluation as an outpatient by hematology.  Anemia of chronic renal disease: Hemoglobin is actually stable continue to monitor intermittently. Follow-up with hematology as an outpatient.  History of GI bleed on last admission, EGD was unremarkable, no signs of overt bleeding she denies any melanotic stools. Continue Protonix  .  Paroxysmal atrial fibrillation/history of PE: Continue Eliquis .  Rate controlled metoprolol .  Venous stasis dermatitis: Continue wound care.  PAD/elevated troponins: She denies any chest pain. Cardiac biomarkers have basically remained flat.  Obesity, Class III, BMI 40-49.9 (morbid obesity)  (HCC) Noted   DVT prophylaxis: eliquis  Family Communication:husband Status is: Observation The patient remains OBS appropriate and will d/c before 2 midnights.    Code Status:     Code Status Orders  (From admission, onward)           Start     Ordered   04/10/24 2049  Do not attempt resuscitation (DNR)- Limited -Do Not Intubate (DNI)  Continuous       Question Answer Comment  If pulseless and not breathing No CPR or chest compressions.   In Pre-Arrest Conditions (Patient Is Breathing and Has A Pulse) Do not intubate. Provide all appropriate non-invasive medical interventions. Avoid ICU transfer unless indicated or required.   Consent: Discussion documented in EHR or advanced directives reviewed      04/10/24 2049           Code Status History     Date Active Date Inactive Code Status Order ID Comments User Context   03/29/2024 0002 04/04/2024 1520 Limited: Do not attempt resuscitation (DNR) -DNR-LIMITED -Do Not Intubate/DNI  [id number]  Silvester Ales, MD ED   12/28/2021 2302 01/03/2022 2259 DNR [id number]  Manfred Driver, DO Inpatient   05/01/2021 0004 05/06/2021 1902 DNR [id number]  Zachary Evalene RAMAN, NP Inpatient   10/09/2019 1759 10/11/2019 2038 DNR [id number]  Lue Elsie BROCKS, MD Inpatient   10/09/2019 1505 10/09/2019 1759 Full Code [id number]  Will Almarie MATSU, MD ED   11/03/2018 1835 11/07/2018 1906 DNR [id number]  Briana Elgin LABOR, MD Inpatient         IV Access:   Peripheral IV   Procedures and diagnostic studies:   No results found.    Medical Consultants:   None.   Subjective:    Pamela [name] no complaints. Objective:  Vitals:   04/13/24 1225 04/13/24 2123 04/14/24 0537 04/14/24 0930  BP: (!) 154/62 (!) 163/54 138/72   Pulse: 62 75 66   Resp: 16     Temp:  98.6 F (37 C) 98.5 F (36.9 C)   TempSrc:  Oral Oral   SpO2: 91%     Weight:    106.5 kg  Height:    5' 2 (1.575 m)   SpO2: 91 % O2 Flow Rate (L/min): 2  L/min   Intake/Output Summary (Last 24 hours) at 04/14/2024 1044 Last data filed at 04/14/2024 0825 Gross per 24 hour  Intake 240 ml  Output 450 ml  Net -210 ml   Filed Weights   04/14/24 0930  Weight: 106.5 kg    Exam: General exam: In no acute distress. Respiratory system: Good air movement and clear to auscultation. Cardiovascular system: S1 & S2 heard, RRR. No JVD. Gastrointestinal system: Abdomen is nondistended, soft and nontender.  Extremities: No pedal edema. Skin: No rashes, lesions or ulcers Psychiatry: Judgement and insight appear normal. Mood & affect appropriate. Data Reviewed:    Labs: Basic Metabolic Panel: Recent Labs  Lab 04/10/24 1520 04/11/24 0452 04/14/24 0432  NA 140 142 141  K 3.9 3.7 3.6  CL 103 103 101  CO2 27 30 32  GLUCOSE 86 90 95  BUN 36* 32* 32*  CREATININE 1.82* 1.81* 1.96*  CALCIUM  9.1 9.0 8.7*  MG 2.4  --   --    GFR Estimated Creatinine Clearance: 23.2 mL/min (A) (by C-G formula based on SCr of 1.96 mg/dL (H)). Liver Function Tests: Recent Labs  Lab 04/10/24 1520  AST 19  ALT 14  ALKPHOS 65  BILITOT 0.6  PROT 6.2*  ALBUMIN  3.5   No results for input(s): LIPASE, AMYLASE in the last 168 hours. No results for input(s): AMMONIA in the last 168 hours. Coagulation profile No results for input(s): INR, PROTIME in the last 168 hours. COVID-19 Labs  No results for input(s): DDIMER, FERRITIN, LDH, CRP in the last 72 hours.  Lab Results  Component Value Date   SARSCOV2NAA NEGATIVE 03/29/2024   SARSCOV2NAA NEGATIVE 12/28/2021   SARSCOV2NAA POSITIVE (A) 04/30/2021   SARSCOV2NAA NEGATIVE 10/09/2019    CBC: Recent Labs  Lab 04/10/24 1520 04/11/24 0452  WBC 12.8* 12.8*  NEUTROABS 9.9*  --   HGB 9.7* 10.1*  HCT 31.8* 32.3*  MCV 98.5 98.2  PLT 263 256   Cardiac Enzymes: No results for input(s): CKTOTAL, CKMB, CKMBINDEX, TROPONINI in the last 168 hours. BNP (last 3 results) Recent Labs     03/28/24 1753 04/10/24 1520  PROBNP 698.0* 953.0*   CBG: Recent Labs  Lab 04/13/24 0806 04/13/24 1225 04/13/24 1724 04/13/24 2122 04/14/24 0759  GLUCAP 98 119* 182* 100* 100*   D-Dimer: No results for input(s): DDIMER in the last 72 hours. Hgb A1c: No results for input(s): HGBA1C in the last 72 hours. Lipid Profile: No results for input(s): CHOL, HDL, LDLCALC, TRIG, CHOLHDL, LDLDIRECT in the last 72 hours. Thyroid  function studies: No results for input(s): TSH, T4TOTAL, T3FREE, THYROIDAB in the last 72 hours.  Invalid input(s): FREET3  Anemia work up: No results for input(s): VITAMINB12, FOLATE, FERRITIN, TIBC, IRON, RETICCTPCT in the last 72 hours.  Sepsis Labs: Recent Labs  Lab 04/10/24 1520 04/11/24 0452  WBC 12.8* 12.8*   Microbiology No results found for this or any previous visit (from the past 240 hours).   Medications:    acidophilus  1 capsule Oral Daily  allopurinol  100 mg Oral Daily   apixaban   2.5 mg Oral BID   calcium  carbonate  1,250 mg Oral Q breakfast   dapagliflozin  propanediol  10 mg Oral Daily   ferrous sulfate  325 mg Oral Q breakfast   furosemide   20 mg Oral Daily   insulin  aspart  0-5 Units Subcutaneous QHS   insulin  aspart  0-9 Units Subcutaneous TID WC   LORazepam   1 mg Intravenous Once   losartan   50 mg Oral Daily   metoprolol  succinate  25 mg Oral Daily   pantoprazole   40 mg Oral BID   pravastatin   10 mg Oral QHS   thiamine  100 mg Oral Daily   Continuous Infusions:    LOS: 0 days   Pamela [name]  [facility]  04/14/2024, 10:44 AM

## 2024-04-15 DIAGNOSIS — R531 Weakness: Secondary | ICD-10-CM | POA: Diagnosis not present

## 2024-04-15 LAB — GLUCOSE, CAPILLARY
Glucose-Capillary: 110 mg/dL — ABNORMAL HIGH (ref 70–99)
Glucose-Capillary: 109 mg/dL — ABNORMAL HIGH (ref 70–99)
Glucose-Capillary: 128 mg/dL — ABNORMAL HIGH (ref 70–99)
Glucose-Capillary: 121 mg/dL — ABNORMAL HIGH (ref 70–99)

## 2024-04-15 MED ORDER — PANTOPRAZOLE SODIUM 40 MG PO TBEC
40.0000 mg | DELAYED_RELEASE_TABLET | Freq: Every day | ORAL | Status: DC
  Administered 2024-04-15 – 2024-04-18 (×4): 40 mg via ORAL
  Filled 2024-04-15 (×3): qty 1

## 2024-04-15 MED ORDER — FUROSEMIDE 20 MG PO TABS
20.0000 mg | ORAL_TABLET | Freq: Every day | ORAL | Status: DC
Start: 2024-04-15 — End: 2024-04-18
  Administered 2024-04-15 – 2024-04-18 (×4): 20 mg via ORAL
  Filled 2024-04-15 (×4): qty 1

## 2024-04-15 MED ORDER — LOSARTAN POTASSIUM 50 MG PO TABS
100.0000 mg | ORAL_TABLET | Freq: Every day | ORAL | Status: DC
  Administered 2024-04-15 – 2024-04-17 (×3): 100 mg via ORAL
  Filled 2024-04-15 (×3): qty 2

## 2024-04-15 NOTE — Progress Notes (Signed)
 [facility] PROGRESS NOTE    Progress Note  Pamela [name]  FMW:[id number] DOB: August 22, 1934 DOA: 04/10/2024 PCP: [name], MD     Brief Narrative:   Pamela [name] is an 89 y.o. female past medical history of diabetes mellitus type 2, chronic kidney stage IV, anemia of chronic disease, A-fib PE on Eliquis , chronic diastolic heart failure with a last EF of 60%, she was recently discharged on 04/04/2024 for acute decompensated heart failure, comes into the ED with leg weakness and difficulty getting up for 2 weeks,   Assessment/Plan:   Bilateral lower extremity Weakness/ambulatory dysfunction: She has severe arthritis of the knees bilaterally. Medically stable to be transferred to skilled nursing facility, awaiting placement.  Chronic diastolic heart failure/essential hypertension: Chest x-ray is clear she is not hypoxic or tachycardic. Continue Farxiga , ARB  and metoprolol .  Titrate ARB as blood pressures rising. Resume Lasix . She appears to be compensated. Blood pressure is well-controlled continue losartan .  Diabetes mellitus type 2 Continue Farxiga , blood glucose is well-controlled.  Chronic kidney disease stage IV: Creatinine appears to be at baseline.  Persistent leukocytosis: She remain afebrile no cough or shortness of breath.   She will need further evaluation as an outpatient by hematology.  Anemia of chronic renal disease: Hemoglobin is actually stable continue to monitor intermittently. Follow-up with hematology as an outpatient.  History of GI bleed on last admission, EGD was unremarkable, no signs of overt bleeding she denies any melanotic stools. Continue Protonix  .  Paroxysmal atrial fibrillation/history of PE: Continue Eliquis .  Rate controlled metoprolol .  Venous stasis dermatitis: Continue wound care.  PAD/elevated troponins: She denies any chest pain. Cardiac biomarkers have basically remained flat.  Obesity, Class III, BMI 40-49.9  (morbid obesity) (HCC) Noted   DVT prophylaxis: eliquis  Family Communication:husband Status is: Observation The patient remains OBS appropriate and will d/c before 2 midnights.    Code Status:     Code Status Orders  (From admission, onward)           Start     Ordered   04/10/24 2049  Do not attempt resuscitation (DNR)- Limited -Do Not Intubate (DNI)  Continuous       Question Answer Comment  If pulseless and not breathing No CPR or chest compressions.   In Pre-Arrest Conditions (Patient Is Breathing and Has A Pulse) Do not intubate. Provide all appropriate non-invasive medical interventions. Avoid ICU transfer unless indicated or required.   Consent: Discussion documented in EHR or advanced directives reviewed      04/10/24 2049           Code Status History     Date Active Date Inactive Code Status Order ID Comments User Context   03/29/2024 0002 04/04/2024 1520 Limited: Do not attempt resuscitation (DNR) -DNR-LIMITED -Do Not Intubate/DNI  [id number]  Silvester Ales, MD ED   12/28/2021 2302 01/03/2022 2259 DNR [id number]  Manfred Driver, DO Inpatient   05/01/2021 0004 05/06/2021 1902 DNR [id number]  Zachary Evalene RAMAN, NP Inpatient   10/09/2019 1759 10/11/2019 2038 DNR [id number]  Lue Elsie BROCKS, MD Inpatient   10/09/2019 1505 10/09/2019 1759 Full Code [id number]  Will Almarie MATSU, MD ED   11/03/2018 1835 11/07/2018 1906 DNR [id number]  Briana Elgin LABOR, MD Inpatient         IV Access:   Peripheral IV   Procedures and diagnostic studies:   No results found.    Medical Consultants:   None.   Subjective:    Pamela [name] no  complaints. Objective:    Vitals:   04/14/24 0930 04/14/24 1219 04/14/24 2045 04/15/24 0542  BP:  (!) 128/52 (!) 137/50 (!) 157/62  Pulse:  65 64 69  Resp:   18 17  Temp:  97.7 F (36.5 C) 97.9 F (36.6 C) 97.8 F (36.6 C)  TempSrc:   Oral   SpO2:  99% 96% 98%  Weight: 106.5 kg     Height: 5' 2 (1.575 m)      SpO2:  98 % O2 Flow Rate (L/min): 2 L/min   Intake/Output Summary (Last 24 hours) at 04/15/2024 0908 Last data filed at 04/14/2024 1700 Gross per 24 hour  Intake 430 ml  Output 450 ml  Net -20 ml   Filed Weights   04/14/24 0930  Weight: 106.5 kg    Exam: General exam: In no acute distress. Respiratory system: Good air movement and clear to auscultation. Cardiovascular system: S1 & S2 heard, RRR. No JVD. Gastrointestinal system: Abdomen is nondistended, soft and nontender.  Extremities: No pedal edema. Skin: No rashes, lesions or ulcers Psychiatry: Judgement and insight appear normal. Mood & affect appropriate. Data Reviewed:    Labs: Basic Metabolic Panel: Recent Labs  Lab 04/10/24 1520 04/11/24 0452 04/14/24 0432  NA 140 142 141  K 3.9 3.7 3.6  CL 103 103 101  CO2 27 30 32  GLUCOSE 86 90 95  BUN 36* 32* 32*  CREATININE 1.82* 1.81* 1.96*  CALCIUM  9.1 9.0 8.7*  MG 2.4  --   --    GFR Estimated Creatinine Clearance: 23.2 mL/min (A) (by C-G formula based on SCr of 1.96 mg/dL (H)). Liver Function Tests: Recent Labs  Lab 04/10/24 1520  AST 19  ALT 14  ALKPHOS 65  BILITOT 0.6  PROT 6.2*  ALBUMIN  3.5   No results for input(s): LIPASE, AMYLASE in the last 168 hours. No results for input(s): AMMONIA in the last 168 hours. Coagulation profile No results for input(s): INR, PROTIME in the last 168 hours. COVID-19 Labs  No results for input(s): DDIMER, FERRITIN, LDH, CRP in the last 72 hours.  Lab Results  Component Value Date   SARSCOV2NAA NEGATIVE 03/29/2024   SARSCOV2NAA NEGATIVE 12/28/2021   SARSCOV2NAA POSITIVE (A) 04/30/2021   SARSCOV2NAA NEGATIVE 10/09/2019    CBC: Recent Labs  Lab 04/10/24 1520 04/11/24 0452  WBC 12.8* 12.8*  NEUTROABS 9.9*  --   HGB 9.7* 10.1*  HCT 31.8* 32.3*  MCV 98.5 98.2  PLT 263 256   Cardiac Enzymes: No results for input(s): CKTOTAL, CKMB, CKMBINDEX, TROPONINI in the last 168 hours. BNP (last  3 results) Recent Labs    03/28/24 1753 04/10/24 1520  PROBNP 698.0* 953.0*   CBG: Recent Labs  Lab 04/14/24 0759 04/14/24 1145 04/14/24 1722 04/14/24 2105 04/15/24 0759  GLUCAP 100* 143* 140* 135* 110*   D-Dimer: No results for input(s): DDIMER in the last 72 hours. Hgb A1c: No results for input(s): HGBA1C in the last 72 hours. Lipid Profile: No results for input(s): CHOL, HDL, LDLCALC, TRIG, CHOLHDL, LDLDIRECT in the last 72 hours. Thyroid  function studies: No results for input(s): TSH, T4TOTAL, T3FREE, THYROIDAB in the last 72 hours.  Invalid input(s): FREET3  Anemia work up: No results for input(s): VITAMINB12, FOLATE, FERRITIN, TIBC, IRON, RETICCTPCT in the last 72 hours.  Sepsis Labs: Recent Labs  Lab 04/10/24 1520 04/11/24 0452  WBC 12.8* 12.8*   Microbiology No results found for this or any previous visit (from the past 240 hours).   Medications:  acidophilus  1 capsule Oral Daily   allopurinol  100 mg Oral Daily   apixaban   2.5 mg Oral BID   calcium  carbonate  1,250 mg Oral Q breakfast   dapagliflozin  propanediol  10 mg Oral Daily   ferrous sulfate  325 mg Oral Q breakfast   insulin  aspart  0-5 Units Subcutaneous QHS   insulin  aspart  0-9 Units Subcutaneous TID WC   LORazepam   1 mg Intravenous Once   losartan   50 mg Oral Daily   metoprolol  succinate  25 mg Oral Daily   pantoprazole   40 mg Oral BID   pravastatin   10 mg Oral QHS   thiamine  100 mg Oral Daily   Continuous Infusions:    LOS: 0 days   Erle Odell Castor  [facility]  04/15/2024, 9:08 AM

## 2024-04-15 NOTE — TOC Initial Note (Signed)
 Transition of Care [facility]) - Initial/Assessment Note    Patient Details  Name: Pamela [name] MRN: [id number] Date of Birth: 11-24-1934  Transition of Care [facility]) CM/SW Contact:    Heather DELENA Saltness, LCSW Phone Number: 04/15/2024, 1:00 PM  Clinical Narrative:                 CSW met with pt and spouse, Kharter Sestak, at bedside to discuss PT's recommendation for SNF rehab upon discharge. Pt reports being open to SNF rehab, facility preference is [facility]. Pt also reports preferring private room. CSW spoke with [facility] [place] who reports inability to accept pt due to [employer] capacity. CSW completed FL-2 and sent referrals to SNF locations in [place] and surrounding areas. TOC will continue to follow, awaiting bed offers.    Expected Discharge Plan: Skilled Nursing Facility Barriers to Discharge: Continued Medical Work up, SNF Pending bed offer   Patient Goals and CMS Choice Patient states their goals for this hospitalization and ongoing recovery are:: To go to SNF for rehab CMS Medicare.gov Compare Post Acute Care list provided to:: Patient Choice offered to / list presented to : Patient, Spouse [place] ownership interest in [facility].provided to:: Patient    Expected Discharge Plan and Services In-house Referral: Clinical Social Work Discharge Planning Services: NA Post Acute Care Choice: Skilled Nursing Facility Living arrangements for the past 2 months: Single Family Home                 DME Arranged: N/A DME Agency: NA       HH Arranged: NA HH Agency: NA        Prior Living Arrangements/Services Living arrangements for the past 2 months: Single Family Home Lives with:: Spouse Patient language and need for interpreter reviewed:: Yes Do you feel safe going back to the place where you live?: Yes      Need for Family Participation in Patient Care: Yes (Comment) Care giver support system in place?: Yes (comment)   Criminal  Activity/Legal Involvement Pertinent to Current Situation/Hospitalization: No - Comment as needed  Activities of Daily Living   ADL Screening (condition at time of admission) Independently performs ADLs?: No Does the patient have a NEW difficulty with bathing/dressing/toileting/self-feeding that is expected to last >3 days?: Yes (Initiates electronic notice to provider for possible OT consult) Does the patient have a NEW difficulty with getting in/out of bed, walking, or climbing stairs that is expected to last >3 days?: Yes (Initiates electronic notice to provider for possible PT consult) Does the patient have a NEW difficulty with communication that is expected to last >3 days?: No Is the patient deaf or have difficulty hearing?: No Does the patient have difficulty seeing, even when wearing glasses/contacts?: No Does the patient have difficulty concentrating, remembering, or making decisions?: No  Permission Sought/Granted Permission sought to share information with : Family Supports Permission granted to share information with : Yes, Verbal Permission Granted  Share Information with NAME: Georgianne Gritz  Permission granted to share info w AGENCY: SNF  Permission granted to share info w Relationship: Spouse  Permission granted to share info w Contact Information: [phone]  Emotional Assessment Appearance:: Appears stated age, Well-Groomed Attitude/Demeanor/Rapport: Engaged Affect (typically observed): Stable, Pleasant, Accepting, Appropriate Orientation: : Oriented to Self, Oriented to Place, Oriented to  Time, Oriented to Situation Alcohol / Substance Use: Not Applicable Psych Involvement: No (comment)  Admission diagnosis:  Weakness [R53.1] Generalized weakness [R53.1] Weakness of both lower extremities [R29.898] Patient  Active Problem List   Diagnosis Date Noted   Generalized weakness 04/11/2024   Weakness 04/10/2024   Acute gastrointestinal bleeding 04/01/2024   ABLA  (acute blood loss anemia) 04/01/2024   Gastric ulcer 04/01/2024   AKI (acute kidney injury) 03/30/2024   Prolonged QT interval 03/29/2024   History of pulmonary embolism 03/29/2024   Debility 03/29/2024   Venous stasis dermatitis 03/29/2024   Elevated troponin 03/29/2024   Coronary artery calcification seen on CAT scan 03/29/2024   Pure hypercholesterolemia 03/29/2024   PAD (peripheral artery disease) 03/29/2024   CHF exacerbation (HCC) 03/28/2024   Acute on chronic heart failure with mildly reduced ejection fraction (HFmrEF, 41-49%) (HCC) 12/29/2021   Hypertensive emergency 12/29/2021   CAP (community acquired pneumonia) 12/29/2021   Left knee pain 12/29/2021   Fall at home, initial encounter 12/29/2021   Elevated d-dimer 12/29/2021   PAF (paroxysmal atrial fibrillation) (HCC)    Acute CHF (congestive heart failure) (HCC) 12/28/2021   Pulmonary embolism (HCC) 05/01/2021   Bilateral pulmonary embolism (HCC) 04/30/2021   Acute pulmonary embolism (HCC)    COVID-19 virus infection    Acute diastolic CHF (congestive heart failure) (HCC) 10/11/2019   Pulmonary edema 10/09/2019   Acute on chronic respiratory failure with hypoxia (HCC) 10/09/2019   Duodenal diverticulum, periampullary 11/03/2018   Diverticulosis of sigmoid colon 11/03/2018   Intestine with microperforation (Sigmoid vs Meckel's) 11/03/2018   Common bile duct stone 06/22/2017   Mixed hyperlipidemia 06/22/2017   CKD (chronic kidney disease) stage 3, GFR 30-59 ml/min (HCC) 06/22/2017   Chronic pain 06/22/2017   Obesity, Class III, BMI 40-49.9 (morbid obesity) (HCC) 06/22/2017   Seizures (HCC) 06/22/2017   Type 2 diabetes mellitus, without long-term current use of insulin  (HCC) 06/22/2017   RUQ pain    Bloating 11/04/2014   Hx of adenomatous colonic polyps 2016    Bilateral lower extremity edema 09/10/2013   Primary hypertension 09/10/2013   PCP:  [name], MD Pharmacy:   [id number] [place], [place] -  [address] [place] [address] Phone: [phone] Fax: [phone]  [facility] - [facility] [address] [place] [address] Phone: [phone] Fax: [phone]   Social Drivers of Health (SDOH) Social History: SDOH Screenings   Food Insecurity: No Food Insecurity (04/11/2024)  Housing: Low Risk  (04/11/2024)  Transportation Needs: No Transportation Needs (04/11/2024)  Utilities: Not At Risk (04/11/2024)  Social Connections: Moderately Isolated (04/11/2024)  Tobacco Use: Medium Risk (04/10/2024)   SDOH Interventions: None indicated     Readmission Risk Interventions    04/04/2024    9:24 AM  Readmission Risk Prevention Plan  Transportation Screening Complete  HRI or Home Care Consult Complete  Social Work Consult for Recovery Care Planning/Counseling Complete  Palliative Care Screening Not Applicable  Medication Review Oceanographer) Complete    Signed: Heather Saltness, MSW, LCSW Clinical Social Worker Inpatient Care Management 04/15/2024 1:03 PM

## 2024-04-15 NOTE — Plan of Care (Signed)

## 2024-04-15 NOTE — [place] FL2 (Addendum)
 [place]  MEDICAID FL2 LEVEL OF CARE FORM     IDENTIFICATION  Patient Name: Pamela [name] Birthdate: May 10, 1935 Sex: female Admission Date (Current Location): 04/10/2024  [facility] and [place] Number:  Producer, television/film/video and Address:  [facility],  [address], [place] [address]      Provider Number: [id number]  Attending Physician Name and Address:  Odell Celinda Balo, MD  Relative Name and Phone Number:  Anushree, Dorsi (Spouse) [phone]    Current Level of Care: Hospital Recommended Level of Care: Skilled Nursing Facility Prior Approval Number:    Date Approved/Denied:   PASRR Number: [id number]  Discharge Plan: SNF    Current Diagnoses: Patient Active Problem List   Diagnosis Date Noted   Generalized weakness 04/11/2024   Weakness 04/10/2024   Acute gastrointestinal bleeding 04/01/2024   ABLA (acute blood loss anemia) 04/01/2024   Gastric ulcer 04/01/2024   AKI (acute kidney injury) 03/30/2024   Prolonged QT interval 03/29/2024   History of pulmonary embolism 03/29/2024   Debility 03/29/2024   Venous stasis dermatitis 03/29/2024   Elevated troponin 03/29/2024   Coronary artery calcification seen on CAT scan 03/29/2024   Pure hypercholesterolemia 03/29/2024   PAD (peripheral artery disease) 03/29/2024   CHF exacerbation (HCC) 03/28/2024   Acute on chronic heart failure with mildly reduced ejection fraction (HFmrEF, 41-49%) (HCC) 12/29/2021   Hypertensive emergency 12/29/2021   CAP (community acquired pneumonia) 12/29/2021   Left knee pain 12/29/2021   Fall at home, initial encounter 12/29/2021   Elevated d-dimer 12/29/2021   PAF (paroxysmal atrial fibrillation) (HCC)    Acute CHF (congestive heart failure) (HCC) 12/28/2021   Pulmonary embolism (HCC) 05/01/2021   Bilateral pulmonary embolism (HCC) 04/30/2021   Acute pulmonary embolism (HCC)    COVID-19 virus infection    Acute diastolic CHF (congestive heart failure) (HCC)  10/11/2019   Pulmonary edema 10/09/2019   Acute on chronic respiratory failure with hypoxia (HCC) 10/09/2019   Duodenal diverticulum, periampullary 11/03/2018   Diverticulosis of sigmoid colon 11/03/2018   Intestine with microperforation (Sigmoid vs Meckel's) 11/03/2018   Common bile duct stone 06/22/2017   Mixed hyperlipidemia 06/22/2017   CKD (chronic kidney disease) stage 3, GFR 30-59 ml/min (HCC) 06/22/2017   Chronic pain 06/22/2017   Obesity, Class III, BMI 40-49.9 (morbid obesity) (HCC) 06/22/2017   Seizures (HCC) 06/22/2017   Type 2 diabetes mellitus, without long-term current use of insulin  (HCC) 06/22/2017   RUQ pain    Bloating 11/04/2014   Hx of adenomatous colonic polyps 2016    Bilateral lower extremity edema 09/10/2013   Primary hypertension 09/10/2013    Orientation RESPIRATION BLADDER Height & Weight     Self, Time, Situation, Place  O2 (2L via Nasal Cannula) Incontinent Weight: 234 lb 14.4 oz (106.5 kg) Height:  5' 2 (157.5 cm)  BEHAVIORAL SYMPTOMS/MOOD NEUROLOGICAL BOWEL NUTRITION STATUS      Continent Diet (Regular)  AMBULATORY STATUS COMMUNICATION OF NEEDS Skin   Extensive Assist Verbally Normal                       Personal Care Assistance Level of Assistance  Bathing, Feeding, Dressing Bathing Assistance: Maximum assistance Feeding assistance: Independent Dressing Assistance: Maximum assistance     Functional Limitations Info  Sight, Hearing, Speech Sight Info: Impaired (eyeglasses) Hearing Info: Adequate Speech Info: Adequate    SPECIAL CARE FACTORS FREQUENCY  PT (By licensed PT), OT (By licensed OT)     PT Frequency: 5x per  week OT Frequency: 5x per week            Contractures Contractures Info: Not present    Additional Factors Info  Code Status, Allergies Code Status Info: DNR Allergies Info: Levofloxacin, Lisinopril, Oxycodone            Current Medications (04/15/2024):  This is the current hospital active medication  list Current Facility-Administered Medications  Medication Dose Route Frequency Provider Last Rate Last Admin   acetaminophen  (TYLENOL ) tablet 650 mg  650 mg Oral Q6H PRN Kc, Ramesh, MD       Or   acetaminophen  (TYLENOL ) suppository 650 mg  650 mg Rectal Q6H PRN Kc, Ramesh, MD       acidophilus (RISAQUAD) capsule 1 capsule  1 capsule Oral Daily Kc, Ramesh, MD   1 capsule at 04/15/24 0924   allopurinol (ZYLOPRIM) tablet 100 mg  100 mg Oral Daily Kc, Ramesh, MD   100 mg at 04/15/24 9074   ALPRAZolam  (XANAX ) tablet 0.25 mg  0.25 mg Oral BID PRN Christobal Guadalajara, MD   0.25 mg at 04/15/24 9066   apixaban  (ELIQUIS ) tablet 2.5 mg  2.5 mg Oral BID Kc, Guadalajara, MD   2.5 mg at 04/15/24 9075   calcium  carbonate (OS-CAL - dosed in mg of elemental calcium ) tablet 1,250 mg  1,250 mg Oral Q breakfast Kc, Ramesh, MD   1,250 mg at 04/15/24 9075   dapagliflozin  propanediol (FARXIGA ) tablet 10 mg  10 mg Oral Daily Kc, Ramesh, MD   10 mg at 04/15/24 0925   ferrous sulfate tablet 325 mg  325 mg Oral Q breakfast Kc, Guadalajara, MD   325 mg at 04/15/24 9075   furosemide  (LASIX ) tablet 20 mg  20 mg Oral Daily Odell Celinda Balo, MD   20 mg at 04/15/24 9071   HYDROcodone -acetaminophen  (NORCO/VICODIN) 5-325 MG per tablet 1 tablet  1 tablet Oral Q6H PRN Christobal Guadalajara, MD   1 tablet at 04/14/24 2118   insulin  aspart (novoLOG ) injection 0-5 Units  0-5 Units Subcutaneous QHS Kc, Ramesh, MD       insulin  aspart (novoLOG ) injection 0-9 Units  0-9 Units Subcutaneous TID WC Kc, Ramesh, MD   1 Units at 04/14/24 1734   LORazepam  (ATIVAN ) injection 1 mg  1 mg Intravenous Once Chavez, Abigail, NP       losartan  (COZAAR ) tablet 100 mg  100 mg Oral Daily Odell Celinda, Balo, MD   100 mg at 04/15/24 9075   metoprolol  succinate (TOPROL -XL) 24 hr tablet 25 mg  25 mg Oral Daily Kc, Guadalajara, MD   25 mg at 04/15/24 9074   Muscle Rub CREA   Topical PRN Christobal Guadalajara, MD       pantoprazole  (PROTONIX ) EC tablet 40 mg  40 mg Oral Daily Odell Celinda Balo,  MD   40 mg at 04/15/24 9075   pravastatin  (PRAVACHOL ) tablet 10 mg  10 mg Oral QHS Kc, Guadalajara, MD   10 mg at 04/14/24 2118   senna-docusate (Senokot-S) tablet 1 tablet  1 tablet Oral QHS PRN Christobal Guadalajara, MD       thiamine (VITAMIN B1) tablet 100 mg  100 mg Oral Daily de Clint Kill, Cortney E, NP   100 mg at 04/15/24 9074   traZODone  (DESYREL ) tablet 100 mg  100 mg Oral QHS PRN Christobal Guadalajara, MD         Discharge Medications: Please see discharge summary for a list of discharge medications.  Relevant Imaging Results:  Relevant Lab Results:  Additional Information SSN:[id number]  Heather DELENA Saltness, LCSW

## 2024-04-15 NOTE — Progress Notes (Signed)
 Physical Therapy Treatment Patient Details Name: Pamela [name] MRN: [id number] DOB: [birth year] Today's Date: 04/15/2024   History of Present Illness 89 yo female presents to ED 04/10/24, just DC'd 10/2, with LE weakness, inability to walk. H/O fall several weeks prior. PMH : HF, arthritis, HTN, DM II, CKD IIIb, HLD and PE, GIB, gout, PAF, venous stasis dermatitis, anemia    PT Comments  Pt limited by anxiousness, needing constant relaxation cues with temporary improvement in mood. Pt needing min A+2 for assistance and safety due to fear of falling with sit to stand reps from regular height bed. Pt pulling from braced RW per pt request, unable to perform sit to stand when pushing from seated surface. Pt able to maintain static standing or 10-30 sec after the 3 sit to stand reps, holding to RW, involuntary shaking noted on 1st rep but not 2nd or 3rd. Pt unable to clear feet and verbalizing fear of falling so trialed stedy for transfer to recliner at bedside. Pt powers to stand on stedy with min A+2, pt with significant increased anxiousness, reaching for objects, verbalizing fear, involuntary shaking of BLE and pt ultimately requesting to return to sitting on bedside. Assisted pt back to supine, all needs in reach and spouse at bedside. Educated pt on time OOB and BLE exercises while supine in bed and pt verbalizes understanding. Patient will benefit from continued inpatient follow up therapy, <3 hours/day.   If plan is discharge home, recommend the following: Two people to help with walking and/or transfers;A lot of help with bathing/dressing/bathroom;Assistance with cooking/housework;Assist for transportation;Help with stairs or ramp for entrance   Can travel by private vehicle     No  Equipment Recommendations  None recommended by PT    Recommendations for Other Services       Precautions / Restrictions Precautions Precautions: Fall Recall of Precautions/Restrictions:  Intact Restrictions Weight Bearing Restrictions Per Provider Order: No     Mobility  Bed Mobility Overal bed mobility: Needs Assistance Bed Mobility: Supine to Sit     Supine to sit: Contact guard, HOB elevated, Used rails Sit to supine: Mod assist   General bed mobility comments: pt slowly mobilizes to bedside with bedrail and elevated HOB, slowly scoots each hip forward to place feet on floor; mod A to lift BLE back into bed, +2 to scoot up in bed with bedpad and trendelenburg    Transfers Overall transfer level: Needs assistance Equipment used: Rolling walker (2 wheels) Transfers: Sit to/from Stand Sit to Stand: Min assist, +2 physical assistance, +2 safety/equipment           General transfer comment: min A +2 to complete 3 sit to stand reps from regular height bed, cues to shift weight forward, pt pulling from braced RW, limited standing tolerance 10-30 sec increments with each stand; unable to clear feet in static standing, increased anxiousness in standing, involuntary shaking on 1st STS without improvement when cued for relaxation but none noted on 2nd and 3rd rep; completed 1 sit to stand rep via STEDY, pt with involuntary shaking, verbalizing strong fear of falling, relaxation cues not helpful, returned to sitting at bedside as pt declines to transfer to recliner    Ambulation/Gait                   Stairs             Wheelchair Mobility     Tilt Bed    Modified Rankin (Stroke Patients Only)  Balance Overall balance assessment: Needs assistance Sitting-balance support: Feet unsupported, No upper extremity supported Sitting balance-Leahy Scale: Fair     Standing balance support: Bilateral upper extremity supported, During functional activity, Reliant on assistive device for balance Standing balance-Leahy Scale: Poor                              Communication Communication Communication: No apparent difficulties   Cognition Arousal: Alert Behavior During Therapy: Anxious   PT - Cognitive impairments: No apparent impairments                       PT - Cognition Comments: pt verbalizing fear of falling, constant relaxation cues and pre-education to improve anxiousness Following commands: Intact      Cueing Cueing Techniques: Verbal cues  Exercises      General Comments General comments (skin integrity, edema, etc.): Pt on 2L with BP 119/71, heart rate 99 and SpO2 95-99%      Pertinent Vitals/Pain Pain Assessment Pain Assessment: Faces Faces Pain Scale: Hurts little more Pain Location: BLE Pain Descriptors / Indicators: Heaviness, Discomfort Pain Intervention(s): Limited activity within patient's tolerance, Monitored during session, Repositioned, Relaxation    Home Living                          Prior Function            PT Goals (current goals can now be found in the care plan section) Acute Rehab PT Goals Patient Stated Goal: go home, stand up PT Goal Formulation: With patient/family Time For Goal Achievement: 04/25/24 Potential to Achieve Goals: Fair Progress towards PT goals: Progressing toward goals    Frequency    Min 2X/week      PT Plan      Co-evaluation              AM-PAC PT 6 Clicks Mobility   Outcome Measure  Help needed turning from your back to your side while in a flat bed without using bedrails?: A Little Help needed moving from lying on your back to sitting on the side of a flat bed without using bedrails?: A Lot Help needed moving to and from a bed to a chair (including a wheelchair)?: A Lot Help needed standing up from a chair using your arms (e.g., wheelchair or bedside chair)?: A Lot Help needed to walk in hospital room?: Total Help needed climbing 3-5 steps with a railing? : Total 6 Click Score: 11    End of Session Equipment Utilized During Treatment: Gait belt Activity Tolerance: Patient tolerated treatment  well;Other (comment) (anxiousness) Patient left: in bed;with call bell/phone within reach;with family/visitor present Nurse Communication: Mobility status;Need for lift equipment PT Visit Diagnosis: Unsteadiness on feet (R26.81);Other abnormalities of gait and mobility (R26.89);Repeated falls (R29.6);Muscle weakness (generalized) (M62.81);Pain Pain - Right/Left: Right Pain - part of body: Knee     Time: 8680-8647 PT Time Calculation (min) (ACUTE ONLY): 33 min  Charges:    $Therapeutic Activity: 23-37 mins PT General Charges $$ ACUTE PT VISIT: 1 Visit                     Tori Lynnlee Revels PT, DPT 04/15/24, 2:14 PM

## 2024-04-16 DIAGNOSIS — R531 Weakness: Secondary | ICD-10-CM | POA: Diagnosis not present

## 2024-04-16 LAB — GLUCOSE, CAPILLARY
Glucose-Capillary: 102 mg/dL — ABNORMAL HIGH (ref 70–99)
Glucose-Capillary: 130 mg/dL — ABNORMAL HIGH (ref 70–99)
Glucose-Capillary: 131 mg/dL — ABNORMAL HIGH (ref 70–99)
Glucose-Capillary: 106 mg/dL — ABNORMAL HIGH (ref 70–99)

## 2024-04-16 MED ORDER — ALPRAZOLAM 0.25 MG PO TABS: 0.2500 mg | ORAL_TABLET | Freq: Two times a day (BID) | ORAL | 0 refills | Status: AC | PRN

## 2024-04-16 MED ORDER — HYDROCODONE-ACETAMINOPHEN 5-325 MG PO TABS: 1.0000 | ORAL_TABLET | Freq: Four times a day (QID) | ORAL | 0 refills | Status: AC | PRN

## 2024-04-16 NOTE — Progress Notes (Signed)
 Occupational Therapy Treatment Patient Details Name: Pamela [name] MRN: [id number] DOB: 09-23-1934 Today's Date: 04/16/2024   History of present illness 89 yo female presents to ED 04/10/24, just DC'd 10/2, with LE weakness, inability to walk. H/O fall several weeks prior. PMH : HF, arthritis, HTN, DM II, CKD IIIb, HLD and PE, GIB, gout, PAF, venous stasis dermatitis, anemia   OT comments  Patient seen for skilled OT session this am. Steady progress with access to [facility] for toileting, use of RW for simple STS and stepping x 3 trials then to recliner, and progression of UE therex and breathing integration.  Patient requires continued Acute care hospital level OT services to progress safety and functional performance and allow for discharge. Patient will benefit from continued inpatient follow up therapy, <3 hours/day.         If plan is discharge home, recommend the following:  Two people to help with walking and/or transfers;A lot of help with bathing/dressing/bathroom;Assistance with cooking/housework;Assistance with feeding;Direct supervision/assist for medications management;Direct supervision/assist for financial management;Assist for transportation;Help with stairs or ramp for entrance   Equipment Recommendations  None recommended by OT       Precautions / Restrictions Precautions Precautions: Fall Recall of Precautions/Restrictions: Intact Precaution/Restrictions Comments: monitor sats, ask for pre-med for anxiety Restrictions Weight Bearing Restrictions Per Provider Order: No       Mobility Bed Mobility Overal bed mobility:  (was on commode at start of session and transferred to recliner to remain)                  Transfers Overall transfer level: Needs assistance Equipment used: Rolling walker (2 wheels) Transfers: Sit to/from Stand, Bed to chair/wheelchair/BSC Sit to Stand: Min assist, +2 physical assistance, +2 safety/equipment     Step pivot transfers: Min  assist, +2 physical assistance, +2 safety/equipment     General transfer comment: able to forward step x 3 trials with RW with recliner follow     Balance Overall balance assessment: Needs assistance Sitting-balance support: Feet unsupported, No upper extremity supported Sitting balance-Leahy Scale: Fair Sitting balance - Comments: able  to pull self forward to long sitting   Standing balance support: Bilateral upper extremity supported, During functional activity, Reliant on assistive device for balance Standing balance-Leahy Scale: Poor                             ADL either performed or assessed with clinical judgement   ADL Overall ADL's : Needs assistance/impaired Eating/Feeding: Modified independent;Sitting   Grooming: Wash/dry hands;Wash/dry face;Oral care;Brushing hair;Modified independent;Sitting Grooming Details (indicate cue type and reason): recliner level                 Toilet Transfer: +2 for physical assistance;+2 for safety/equipment;Stand-pivot;BSC/3in1;Rolling walker (2 wheels);Minimal assistance Toilet Transfer Details (indicate cue type and reason): SPT to and from [facility] and amb 6 steps with RW x 3 Toileting- Clothing Manipulation and Hygiene: Maximal assistance;Sitting/lateral lean       Functional mobility during ADLs: +2 for physical assistance;+2 for safety/equipment;Minimal assistance General ADL Comments: cues for hand placement for STS and RW use, relaxation and breathing integration    Extremity/Trunk Assessment Upper Extremity Assessment Upper Extremity Assessment: Generalized weakness RUE Deficits / Details: tremor RUE Coordination: decreased fine motor LUE Deficits / Details: tremor LUE Coordination: decreased fine motor   Lower Extremity Assessment Lower Extremity Assessment: Defer to PT evaluation        Vision  Vision Assessment?: No apparent visual deficits         Communication Communication Communication: No  apparent difficulties   Cognition Arousal: Alert Behavior During Therapy: Anxious Cognition: No apparent impairments             OT - Cognition Comments: Aware of anxiety. Pt wth tremors that do seem to exacerbate when pt facing challenges with mobility incl STS, reports helpful this visit to be pre-medicated as OT secure chat with nursing prior and meds given                 Following commands: Intact        Cueing   Cueing Techniques: Verbal cues  Exercises Exercises: Other exercises (issued and trained in L1 tband for triceps press 10 reps Bly x 2 sets)       General Comments RA with sats remaining >94% during session, cues for breathing integration for relaxation    Pertinent Vitals/ Pain       Pain Assessment Pain Assessment: Faces Faces Pain Scale: No hurt   Frequency  Min 2X/week        Progress Toward Goals  OT Goals(current goals can now be found in the care plan section)  Progress towards OT goals: Progressing toward goals  Acute Rehab OT Goals Patient Stated Goal: to get to rehab soon OT Goal Formulation: With patient/family Time For Goal Achievement: 04/25/24 Potential to Achieve Goals: Good ADL Goals Pt Will Perform Lower Body Bathing: with min assist;with adaptive equipment;sitting/lateral leans Pt Will Perform Lower Body Dressing: with min assist;with adaptive equipment;sitting/lateral leans Pt Will Transfer to Toilet: with +2 assist;with mod assist;bedside commode Pt Will Perform Toileting - Clothing Manipulation and hygiene: sitting/lateral leans;with min assist;with adaptive equipment Pt/caregiver will Perform Home Exercise Program: Increased strength;Both right and left upper extremity;With Supervision;With written HEP provided Additional ADL Goal #1: Pt will verbalize and demonstrate at least 2 methods to manage anxiety prior to mobilizing being deep breathing, medication, or use of sart phone for guided visualization in order to  increase safety and tolerance with functional mobility. Additional ADL Goal #2: Pt will improve sitting balance to good static and fair+ dynamic and tolerate 20 min of EOB sitting while engaging in light UB ADLs without physical assistance other than setup in order to increase safety and participation during seated ADLs.  Plan         AM-PAC OT 6 Clicks Daily Activity     Outcome Measure   Help from another person eating meals?: None Help from another person taking care of personal grooming?: None Help from another person toileting, which includes using toliet, bedpan, or urinal?: A Lot Help from another person bathing (including washing, rinsing, drying)?: A Lot Help from another person to put on and taking off regular upper body clothing?: A Little Help from another person to put on and taking off regular lower body clothing?: A Lot 6 Click Score: 17    End of Session Equipment Utilized During Treatment: Gait belt;Rolling walker (2 wheels)  OT Visit Diagnosis: Unsteadiness on feet (R26.81);Muscle weakness (generalized) (M62.81)   Activity Tolerance Patient tolerated treatment well   Patient Left in chair;with call bell/phone within reach;with chair alarm set;with family/visitor present   Nurse Communication Mobility status        Time: 8894-8864 OT Time Calculation (min): 30 min  Charges: OT General Charges $OT Visit: 1 Visit OT Treatments $Self Care/Home Management : 8-22 mins $Therapeutic Exercise: 8-22 mins  Diany Formosa OT/L Acute Rehabilitation  Department  [phone]  04/16/2024, 1:04 PM

## 2024-04-16 NOTE — Discharge Summary (Signed)
 Physician Discharge Summary  Pamela [name] FMW:[id number] DOB: 05-Apr-1935 DOA: 04/10/2024  PCP: [name], MD  Admit date: 04/10/2024 Discharge date: 04/16/2024  Admitted From: Home Disposition:  SNF  Recommendations for Outpatient Follow-up:  Follow up with PCP in 1-2 weeks Please obtain BMP/CBC in one week Need to be referred to a hematologist for persistent leukocytosis.  Home Health:No  Equipment/Devices:None  Discharge Condition:Stable CODE STATUS: DNR Diet recommendation: Heart Healthy   Brief/Interim Summary:  89 y.o. female past medical history of diabetes mellitus type 2, chronic kidney stage IV, anemia of chronic disease, A-fib PE on Eliquis , chronic diastolic heart failure with a last EF of 60%, she was recently discharged on 04/04/2024 for acute decompensated heart failure, comes into the ED with leg weakness and difficulty getting up for 2 weeks.  Discharge Diagnoses:  Principal Problem:   Weakness Active Problems:   Primary hypertension   CKD (chronic kidney disease) stage 3, GFR 30-59 ml/min (HCC)   Obesity, Class III, BMI 40-49.9 (morbid obesity) (HCC)   Type 2 diabetes mellitus, without long-term current use of insulin  (HCC)   Bilateral pulmonary embolism (HCC)   PAF (paroxysmal atrial fibrillation) (HCC)   Elevated troponin   Generalized weakness  Bilateral lower extremity weakness/ambulatory dysfunction: She has severe arthritis of her knees bilaterally. He had no facial droop no asymmetric weakness. PT evaluated the patient will need skilled nursing facility placement.  Chronic diastolic heart failure/essential hypertension: No change made to her medication continue Farxiga , Lasix , ARB and metoprolol . Titrate medications as needed.  Diabetes mellitus type 2: Continue Farxiga .  Chronic kidney disease stage IV: Her creatinine appears to be at baseline.  Persistent leukocytosis: She has remained afebrile no cough or shortness of breath. She  will need evaluation by hematology as an outpatient.  Anemia of chronic renal disease: Follow-up with EP as an outpatient.  History of GI bleed on last admission: Continue Protonix  twice a day.      Discharge Instructions   Allergies as of 04/16/2024       Reactions   Levofloxacin Nausea And Vomiting, Other (See Comments)   Abdominal pain, also   Lisinopril Cough   Oxycodone  Nausea Only        Medication List     STOP taking these medications    calcium  carbonate 1500 (600 Ca) MG Tabs tablet Commonly known as: OSCAL   ferrous sulfate 325 (65 FE) MG EC tablet       TAKE these medications    Align 4 MG Caps Take 4 mg by mouth daily.   allopurinol 100 MG tablet Commonly known as: ZYLOPRIM Take 1 tablet (100 mg total) by mouth daily.   ALPRAZolam  0.25 MG tablet Commonly known as: XANAX  Take 1 tablet (0.25 mg total) by mouth 2 (two) times daily as needed for anxiety.   apixaban  2.5 MG Tabs tablet Commonly known as: ELIQUIS  Take 1 tablet (2.5 mg total) by mouth 2 (two) times daily. What changed: Another medication with the same name was removed. Continue taking this medication, and follow the directions you see here.   dapagliflozin  propanediol 10 MG Tabs tablet Commonly known as: FARXIGA  Take 10 mg by mouth daily.   diclofenac  Sodium 1 % Gel Commonly known as: VOLTAREN  Apply 2 g topically 4 (four) times daily. What changed:  when to take this reasons to take this   furosemide  20 MG tablet Commonly known as: LASIX  Take 1 tablet (20 mg total) by mouth daily.   HYDROcodone -acetaminophen  5-325 MG tablet  Commonly known as: NORCO/VICODIN Take 1 tablet by mouth every 6 (six) hours as needed for moderate pain (pain score 4-6) or severe pain (pain score 7-10).   losartan  25 MG tablet Commonly known as: COZAAR  Take 25 mg by mouth daily.   lovastatin 20 MG tablet Commonly known as: MEVACOR Take 20 mg by mouth at bedtime.   metoprolol  succinate 25 MG  24 hr tablet Commonly known as: TOPROL -XL Take 1 tablet (25 mg total) by mouth daily. Take with or immediately following a meal.   multivitamin with minerals Tabs tablet Take 1 tablet by mouth daily.   pantoprazole  40 MG tablet Commonly known as: Protonix  Take 1 tablet (40 mg total) by mouth 2 (two) times daily.   traZODone  50 MG tablet Commonly known as: DESYREL  Take 100 mg by mouth at bedtime as needed for sleep.   Tylenol  8 Hour Arthritis Pain 650 MG CR tablet Generic drug: acetaminophen  Take 1,300 mg by mouth in the morning and at bedtime.        Allergies  Allergen Reactions   Levofloxacin Nausea And Vomiting and Other (See Comments)    Abdominal pain, also   Lisinopril Cough   Oxycodone  Nausea Only    Consultations: None   Procedures/Studies: CT Head Wo Contrast Result Date: 04/10/2024 CLINICAL DATA:  Neuro deficit, acute, stroke suspected Extremity weakness, Neuro deficit, acute, stroke suspected EXAM: CT HEAD WITHOUT CONTRAST TECHNIQUE: Contiguous axial images were obtained from the base of the skull through the vertex without intravenous contrast. RADIATION DOSE REDUCTION: This exam was performed according to the departmental dose-optimization program which includes automated exposure control, adjustment of the mA and/or kV according to patient size and/or use of iterative reconstruction technique. COMPARISON:  None Available. FINDINGS: Brain: Patchy and confluent areas of decreased attenuation are noted throughout the deep and periventricular white matter of the cerebral hemispheres bilaterally, compatible with chronic microvascular ischemic disease. No evidence of large-territorial acute infarction. No parenchymal hemorrhage. No mass lesion. No extra-axial collection. No mass effect or midline shift. No hydrocephalus. Basilar cisterns are patent. Vascular: No hyperdense vessel. Atherosclerotic calcifications are present within the cavernous internal carotid arteries.  Skull: No acute fracture or focal lesion. Sinuses/Orbits: Paranasal sinuses and mastoid air cells are clear. Bilateral lens replacement. Otherwise the orbits are unremarkable. Other: None. IMPRESSION: No acute intracranial abnormality. Electronically Signed   By: Morgane  Naveau M.D.   On: 04/10/2024 19:42   DG Chest Portable 1 View Result Date: 04/10/2024 CLINICAL DATA:  Leg weakness. Difficulty getting up and down for 2 weeks. EXAM: PORTABLE CHEST 1 VIEW COMPARISON:  03/28/2024 FINDINGS: Shallow inspiration. Cardiac enlargement with pulmonary vascular congestion. Atelectasis in the lung bases. No edema or consolidation. Similar appearance to previous study. No pleural effusion or pneumothorax. Calcified and tortuous aorta. Old right rib fractures. Degenerative changes in the spine and shoulders. IMPRESSION: Shallow inspiration with atelectasis in the lung bases. Cardiac enlargement with mild vascular congestion. No edema or consolidation. Similar appearance to previous study. Electronically Signed   By: Elsie Gravely M.D.   On: 04/10/2024 17:21   VAS US  LOWER EXTREMITY VENOUS (DVT) Result Date: 03/31/2024  Lower Venous DVT Study Patient Name:  Pamela [name]  Date of Exam:   03/31/2024 Medical Rec #: [id number]       Accession #:    [id number] Date of Birth: [birth year]        Patient Gender: F Patient Age:   45 years Exam Location:  [facility] Procedure:  VAS US  LOWER EXTREMITY VENOUS (DVT) Referring Phys: ANASTASSIA DOUTOVA --------------------------------------------------------------------------------  Indications: Swelling, Pain, and pulmonary embolism.  Risk Factors: Confirmed PE obesity. Anticoagulation: Eliquis . Comparison Study: 02/16/22 -Negative left Lower ext                   01/01/22 - Negative right lwer ext Performing Technologist: Ricka Sturdivant-Jones RDMS, RVT  Examination Guidelines: A complete evaluation includes B-mode imaging, spectral Doppler, color Doppler, and power Doppler  as needed of all accessible portions of each vessel. Bilateral testing is considered an integral part of a complete examination. Limited examinations for reoccurring indications may be performed as noted. The reflux portion of the exam is performed with the patient in reverse Trendelenburg.  +---------+---------------+---------+-----------+----------+--------------+ RIGHT    CompressibilityPhasicitySpontaneityPropertiesThrombus Aging +---------+---------------+---------+-----------+----------+--------------+ CFV      Full           Yes      Yes                                 +---------+---------------+---------+-----------+----------+--------------+ SFJ      Full                                                        +---------+---------------+---------+-----------+----------+--------------+ FV Prox  Full                                                        +---------+---------------+---------+-----------+----------+--------------+ FV Mid   Full           Yes      Yes                                 +---------+---------------+---------+-----------+----------+--------------+ FV DistalFull                                                        +---------+---------------+---------+-----------+----------+--------------+ PFV      Full                                                        +---------+---------------+---------+-----------+----------+--------------+ POP      Full           Yes      Yes                                 +---------+---------------+---------+-----------+----------+--------------+ PTV      Full                                                        +---------+---------------+---------+-----------+----------+--------------+  PERO     Full                                                        +---------+---------------+---------+-----------+----------+--------------+    +---------+---------------+---------+-----------+----------+--------------+ LEFT     CompressibilityPhasicitySpontaneityPropertiesThrombus Aging +---------+---------------+---------+-----------+----------+--------------+ CFV      Full           Yes      Yes                                 +---------+---------------+---------+-----------+----------+--------------+ SFJ      Full                                                        +---------+---------------+---------+-----------+----------+--------------+ FV Prox  Full                                                        +---------+---------------+---------+-----------+----------+--------------+ FV Mid   Full           Yes      Yes                                 +---------+---------------+---------+-----------+----------+--------------+ FV DistalFull                                                        +---------+---------------+---------+-----------+----------+--------------+ PFV      Full                                                        +---------+---------------+---------+-----------+----------+--------------+ POP      Full           Yes      Yes                                 +---------+---------------+---------+-----------+----------+--------------+ PTV      Full                                                        +---------+---------------+---------+-----------+----------+--------------+ PERO     Full                                                        +---------+---------------+---------+-----------+----------+--------------+  Summary: BILATERAL: - No evidence of deep vein thrombosis seen in the lower extremities, bilaterally. -No evidence of popliteal cyst, bilaterally.   *See table(s) above for measurements and observations. Electronically signed by Lonni Gaskins MD on 03/31/2024 at 2:39:33 PM.    Final    DG Knee Right Port Result Date: 03/30/2024 EXAM: 1 or 2  VIEW(S) XRAY OF THE KNEE 03/30/2024 05:56:00 PM COMPARISON: None available. CLINICAL HISTORY: Pain 144615. Fall and pain. FINDINGS: BONES AND JOINTS: No acute fracture. No focal osseous lesion. No joint dislocation. Small joint effusion. Moderate to severe tricompartmental degenerative changes with joint space narrowing and osteophyte formation. SOFT TISSUES: Vascular calcifications. IMPRESSION: 1. Moderate to severe tricompartmental osteoarthritis with joint space narrowing and osteophyte formation. 2. Small knee joint effusion. Electronically signed by: Norman Gatlin MD 03/30/2024 07:23 PM EDT RP Workstation: [id number]   DG Ankle Right Port Result Date: 03/30/2024 EXAM: 1 or 2 VIEW(S) XRAY OF THE ANKLE 03/30/2024 05:56:00 PM CLINICAL HISTORY: Pain 855384. Fall and pain. COMPARISON: None available. FINDINGS: BONES AND JOINTS: No acute fracture. No focal osseous lesion. No joint dislocation. Plantar calcaneal spur. Tibiotalar joint space narrowing with marginal osteophytes. SOFT TISSUES: Subcutaneous soft tissue edema. IMPRESSION: 1. No acute osseous abnormality. Electronically signed by: Norman Gatlin MD 03/30/2024 07:22 PM EDT RP Workstation: [id number]   ECHOCARDIOGRAM COMPLETE Result Date: 03/29/2024    ECHOCARDIOGRAM REPORT   Patient Name:   Pamela [name] Date of Exam: 03/29/2024 Medical Rec #:  [id number]      Height:       62.0 in Accession #:    [id number]     Weight:       255.5 lb Date of Birth:  1935/05/16       BSA:          2.121 m Patient Age:    89 years       BP:           150/77 mmHg Patient Gender: F              HR:           96 bpm. Exam Location:  Inpatient Procedure: 2D Echo, Cardiac Doppler, Color Doppler and Strain Analysis (Both            Spectral and Color Flow Doppler were utilized during procedure). Indications:    CHF I50.21  History:        Patient has prior history of Echocardiogram examinations, most                 recent 12/29/2021. CHF, Signs/Symptoms:Edema and Shortness of                  Breath; Risk Factors:Diabetes.  Sonographer:    BERNARDA ROCKS Referring Phys: [email or web address] ANASTASSIA DOUTOVA IMPRESSIONS  1. Left ventricular ejection fraction, by estimation, is 60 to 65%. The left ventricle has normal function. The left ventricle has no regional wall motion abnormalities. There is mild left ventricular hypertrophy. Left ventricular diastolic parameters are consistent with Grade I diastolic dysfunction (impaired relaxation). The average left ventricular global longitudinal strain is -15.5 %. The global longitudinal strain is abnormal.  2. Right ventricular systolic function is normal. The right ventricular size is normal.  3. The mitral valve is normal in structure. No evidence of mitral valve regurgitation. No evidence of mitral stenosis.  4. The aortic valve is normal in structure. Aortic valve regurgitation is not visualized. No aortic stenosis is present.  5. The inferior vena  cava is normal in size with greater than 50% respiratory variability, suggesting right atrial pressure of 3 mmHg. FINDINGS  Left Ventricle: Left ventricular ejection fraction, by estimation, is 60 to 65%. The left ventricle has normal function. The left ventricle has no regional wall motion abnormalities. The average left ventricular global longitudinal strain is -15.5 %. Strain was performed and the global longitudinal strain is abnormal. The left ventricular internal cavity size was normal in size. There is mild left ventricular hypertrophy. Left ventricular diastolic parameters are consistent with Grade I diastolic dysfunction (impaired relaxation). Right Ventricle: The right ventricular size is normal. No increase in right ventricular wall thickness. Right ventricular systolic function is normal. Left Atrium: Left atrial size was normal in size. Right Atrium: Right atrial size was normal in size. Pericardium: There is no evidence of pericardial effusion. Mitral Valve: The mitral valve is normal in structure.  Mild mitral annular calcification. No evidence of mitral valve regurgitation. No evidence of mitral valve stenosis. MV peak gradient, 8.3 mmHg. The mean mitral valve gradient is 4.0 mmHg. Tricuspid Valve: The tricuspid valve is normal in structure. Tricuspid valve regurgitation is not demonstrated. No evidence of tricuspid stenosis. Aortic Valve: The aortic valve is normal in structure. Aortic valve regurgitation is not visualized. No aortic stenosis is present. Aortic valve mean gradient measures 3.0 mmHg. Aortic valve peak gradient measures 6.7 mmHg. Aortic valve area, by VTI measures 2.61 cm. Pulmonic Valve: The pulmonic valve was normal in structure. Pulmonic valve regurgitation is trivial. No evidence of pulmonic stenosis. Aorta: The aortic root is normal in size and structure. Venous: The inferior vena cava is normal in size with greater than 50% respiratory variability, suggesting right atrial pressure of 3 mmHg. IAS/Shunts: No atrial level shunt detected by color flow Doppler.  LEFT VENTRICLE PLAX 2D LVIDd:         4.00 cm      Diastology LVIDs:         3.00 cm      LV e' medial:    5.97 cm/s LV PW:         1.20 cm      LV E/e' medial:  14.2 LV IVS:        1.30 cm      LV e' lateral:   8.39 cm/s LVOT diam:     1.90 cm      LV E/e' lateral: 10.1 LV SV:         62 LV SV Index:   29           2D Longitudinal Strain LVOT Area:     2.84 cm     2D Strain GLS Avg:     -15.5 %  LV Volumes (MOD) LV vol d, MOD A2C: 183.0 ml LV vol d, MOD A4C: 146.0 ml LV vol s, MOD A2C: 59.3 ml LV vol s, MOD A4C: 50.8 ml LV SV MOD A2C:     123.7 ml LV SV MOD A4C:     146.0 ml LV SV MOD BP:      107.6 ml RIGHT VENTRICLE             IVC RV Basal diam:  3.40 cm     IVC diam: 1.50 cm RV S prime:     24.40 cm/s TAPSE (M-mode): 2.8 cm LEFT ATRIUM             Index        RIGHT ATRIUM  Index LA diam:        3.90 cm 1.84 cm/m   RA Area:     9.95 cm LA Vol (A2C):   44.3 ml 20.88 ml/m  RA Volume:   19.90 ml 9.38 ml/m LA Vol (A4C):    41.1 ml 19.37 ml/m LA Biplane Vol: 44.4 ml 20.93 ml/m  AORTIC VALVE                    PULMONIC VALVE AV Area (Vmax):    2.57 cm     PV Vmax:          1.33 m/s AV Area (Vmean):   2.27 cm     PV Peak grad:     7.1 mmHg AV Area (VTI):     2.61 cm     PR End Diast Vel: 2.81 msec AV Vmax:           129.00 cm/s AV Vmean:          76.800 cm/s AV VTI:            0.236 m AV Peak Grad:      6.7 mmHg AV Mean Grad:      3.0 mmHg LVOT Vmax:         117.00 cm/s LVOT Vmean:        61.600 cm/s LVOT VTI:          0.217 m LVOT/AV VTI ratio: 0.92  AORTA Ao Root diam: 2.60 cm Ao Asc diam:  3.40 cm MITRAL VALVE                TRICUSPID VALVE MV Area (PHT): 4.99 cm     TR Peak grad:   32.7 mmHg MV Area VTI:   2.04 cm     TR Vmax:        286.00 cm/s MV Peak grad:  8.3 mmHg MV Mean grad:  4.0 mmHg     SHUNTS MV Vmax:       1.44 m/s     Systemic VTI:  0.22 m MV Vmean:      89.4 cm/s    Systemic Diam: 1.90 cm MV Decel Time: 152 msec MV E velocity: 85.00 cm/s MV A velocity: 149.00 cm/s MV E/A ratio:  0.57 Oneil Parchment MD Electronically signed by Oneil Parchment MD Signature Date/Time: 03/29/2024/3:50:25 PM    Final    NM Pulmonary Perfusion Result Date: 03/29/2024 CLINICAL DATA:  Pulmonary embolism (PE) suspected, low to intermediate prob, positive D-dimer History of pulmonary embolism. EXAM: NUCLEAR MEDICINE PERFUSION LUNG SCAN TECHNIQUE: Perfusion images were obtained in multiple projections after intravenous injection of radiopharmaceutical. No ventilation imaging performed. RADIOPHARMACEUTICALS:  3.2 mCi Tc-66m MAA IV COMPARISON:  Chest radiographs 03/28/2024.  Chest CTA 04/30/2021. FINDINGS: Low lung volumes with elevation of the right hemidiaphragm. Lateral and oblique views are limited by body habitus. There are no wedge-shaped perfusion defects to suggest acute pulmonary embolism. IMPRESSION: No evidence of acute pulmonary embolism on perfusion scintigraphy by PISAPED criteria. Electronically Signed   By: Elsie Perone M.D.    On: 03/29/2024 14:27   DG Chest Port 1 View Result Date: 03/28/2024 CLINICAL DATA:  Lower extremity swelling.  Fell 5 weeks ago. EXAM: PORTABLE CHEST 1 VIEW COMPARISON:  12/28/2021 FINDINGS: Shallow inspiration with atelectasis in the lung bases. Cardiac enlargement with mild vascular congestion. No obvious edema or consolidation. No pleural effusion or pneumothorax. Mediastinal contours appear intact. Calcified and tortuous aorta. Degenerative changes in the spine and shoulders.  IMPRESSION: Shallow inspiration with atelectasis in the lung bases. Cardiac enlargement with pulmonary vascular congestion. No edema. Electronically Signed   By: Elsie Gravely M.D.   On: 03/28/2024 17:17     Subjective: No complaints  Discharge Exam: Vitals:   04/16/24 0533 04/16/24 0849  BP: (!) 147/54 (!) 146/52  Pulse: 66 70  Resp:    Temp: 97.9 F (36.6 C)   SpO2: 95%    Vitals:   04/15/24 1225 04/15/24 2024 04/16/24 0533 04/16/24 0849  BP: 128/69 (!) 109/49 (!) 147/54 (!) 146/52  Pulse: 70 67 66 70  Resp: 16     Temp: 98.1 F (36.7 C) 98.1 F (36.7 C) 97.9 F (36.6 C)   TempSrc: Oral Oral Oral   SpO2: 99% 95% 95%   Weight:      Height:        General: Pt is alert, awake, not in acute distress Cardiovascular: RRR, S1/S2 +, no rubs, no gallops Respiratory: CTA bilaterally, no wheezing, no rhonchi Abdominal: Soft, NT, ND, bowel sounds + Extremities: no edema, no cyanosis    The results of significant diagnostics from this hospitalization (including imaging, microbiology, ancillary and laboratory) are listed below for reference.     Microbiology: No results found for this or any previous visit (from the past 240 hours).   Labs: BNP (last 3 results) No results for input(s): BNP in the last 8760 hours. Basic Metabolic Panel: Recent Labs  Lab 04/10/24 1520 04/11/24 0452 04/14/24 0432  NA 140 142 141  K 3.9 3.7 3.6  CL 103 103 101  CO2 27 30 32  GLUCOSE 86 90 95  BUN 36* 32*  32*  CREATININE 1.82* 1.81* 1.96*  CALCIUM  9.1 9.0 8.7*  MG 2.4  --   --    Liver Function Tests: Recent Labs  Lab 04/10/24 1520  AST 19  ALT 14  ALKPHOS 65  BILITOT 0.6  PROT 6.2*  ALBUMIN  3.5   No results for input(s): LIPASE, AMYLASE in the last 168 hours. No results for input(s): AMMONIA in the last 168 hours. CBC: Recent Labs  Lab 04/10/24 1520 04/11/24 0452  WBC 12.8* 12.8*  NEUTROABS 9.9*  --   HGB 9.7* 10.1*  HCT 31.8* 32.3*  MCV 98.5 98.2  PLT 263 256   Cardiac Enzymes: No results for input(s): CKTOTAL, CKMB, CKMBINDEX, TROPONINI in the last 168 hours. BNP: Invalid input(s): POCBNP CBG: Recent Labs  Lab 04/15/24 0759 04/15/24 1213 04/15/24 1712 04/15/24 2150 04/16/24 0802  GLUCAP 110* 109* 128* 121* 102*   D-Dimer No results for input(s): DDIMER in the last 72 hours. Hgb A1c No results for input(s): HGBA1C in the last 72 hours. Lipid Profile No results for input(s): CHOL, HDL, LDLCALC, TRIG, CHOLHDL, LDLDIRECT in the last 72 hours. Thyroid  function studies No results for input(s): TSH, T4TOTAL, T3FREE, THYROIDAB in the last 72 hours.  Invalid input(s): FREET3 Anemia work up No results for input(s): VITAMINB12, FOLATE, FERRITIN, TIBC, IRON, RETICCTPCT in the last 72 hours. Urinalysis    Component Value Date/Time   COLORURINE STRAW (A) 04/10/2024 2315   APPEARANCEUR CLEAR 04/10/2024 2315   LABSPEC 1.005 04/10/2024 2315   PHURINE 7.0 04/10/2024 2315   GLUCOSEU 50 (A) 04/10/2024 2315   HGBUR NEGATIVE 04/10/2024 2315   BILIRUBINUR NEGATIVE 04/10/2024 2315   KETONESUR NEGATIVE 04/10/2024 2315   PROTEINUR NEGATIVE 04/10/2024 2315   NITRITE NEGATIVE 04/10/2024 2315   LEUKOCYTESUR TRACE (A) 04/10/2024 2315   Sepsis Labs Recent Labs  Lab  04/10/24 1520 04/11/24 0452  WBC 12.8* 12.8*   Microbiology No results found for this or any previous visit (from the past 240 hours).   Time  coordinating discharge: Over 35 minutes  SIGNED:   Erle Odell Castor, MD  [facility] 04/16/2024, 10:33 AM Pager   If 7PM-7AM, please contact night-coverage [email or web address] Password [email or web address]

## 2024-04-16 NOTE — TOC Progression Note (Signed)
 Transition of Care [facility]) - Progression Note    Patient Details  Name: Pamela [name] MRN: [id number] Date of Birth: 20-Dec-1934  Transition of Care [facility]) CM/SW Contact  Pamela DELENA Saltness, LCSW Phone Number: 04/16/2024, 11:25 AM  Clinical Narrative:    CSW met with pt and husband, Pamela [name], at bedside to discuss SNF bed placement and obtain pt's bed choice. CSW provided pt with list of facilities with available beds, including name of facility, location, and Medicare Star-Ratings. Pt and husband report they are leaning towards choosing [facility], however would like the day to review other bed offers prior to making final decision. TOC will follow up tomorrow.   Medicare Star-Ratings  [facility] [address] [place], [place] [address] [phone] Overall rating ?? Below average  [facility] and [facility] [address] [place], [place] [address] [phone] Overall rating ?? Much below average  [facility] and [facility] [address] [place], [place] [address] [phone] Overall rating ?? Much below average  [facility] [address] [place], [place] [address] [phone] Overall rating ??? Much above average  [facility] and [facility] [address] [place], [place] [address] [phone] Overall rating ????? Much above average  [facility] [address] [place], [place] [address] [phone] Overall rating ? Much below average   Expected Discharge Plan: Skilled Nursing Facility Barriers to Discharge: Continued Medical Work up, SNF Pending bed offer  Expected Discharge Plan and Services In-house Referral: Clinical Social Work Discharge Planning Services: NA Post Acute Care Choice: Skilled Nursing Facility Living arrangements for the past 2 months: Single Family Home                 DME Arranged:  N/A DME Agency: NA       HH Arranged: NA HH Agency: NA         Social Drivers of Health (SDOH) Interventions SDOH Screenings   Food Insecurity: No Food Insecurity (04/11/2024)  Housing: Low Risk  (04/11/2024)  Transportation Needs: No Transportation Needs (04/11/2024)  Utilities: Not At Risk (04/11/2024)  Social Connections: Moderately Isolated (04/11/2024)  Tobacco Use: Medium Risk (04/10/2024)    Readmission Risk Interventions    04/04/2024    9:24 AM  Readmission Risk Prevention Plan  Transportation Screening Complete  HRI or Home Care Consult Complete  Social Work Consult for Recovery Care Planning/Counseling Complete  Palliative Care Screening Not Applicable  Medication Review Oceanographer) Complete    Signed: Heather [name], MSW, LCSW Clinical Social Worker Inpatient Care Management 04/16/2024 1:34 PM

## 2024-04-17 DIAGNOSIS — R7989 Other specified abnormal findings of blood chemistry: Secondary | ICD-10-CM

## 2024-04-17 DIAGNOSIS — E1169 Type 2 diabetes mellitus with other specified complication: Secondary | ICD-10-CM | POA: Diagnosis not present

## 2024-04-17 DIAGNOSIS — I10 Essential (primary) hypertension: Secondary | ICD-10-CM

## 2024-04-17 DIAGNOSIS — I2699 Other pulmonary embolism without acute cor pulmonale: Secondary | ICD-10-CM | POA: Diagnosis not present

## 2024-04-17 DIAGNOSIS — N184 Chronic kidney disease, stage 4 (severe): Secondary | ICD-10-CM

## 2024-04-17 DIAGNOSIS — R531 Weakness: Secondary | ICD-10-CM | POA: Diagnosis not present

## 2024-04-17 DIAGNOSIS — I480 Paroxysmal atrial fibrillation: Secondary | ICD-10-CM | POA: Diagnosis not present

## 2024-04-17 DIAGNOSIS — E66813 Obesity, class 3: Secondary | ICD-10-CM | POA: Diagnosis not present

## 2024-04-17 LAB — CBC WITH DIFFERENTIAL/PLATELET
Abs Immature Granulocytes: 0.05 K/uL (ref 0.00–0.07)
Basophils Absolute: 0.1 K/uL (ref 0.0–0.1)
Basophils Relative: 1 %
Eosinophils Absolute: 0.4 K/uL (ref 0.0–0.5)
Eosinophils Relative: 4 %
HCT: 36.3 % (ref 36.0–46.0)
Hemoglobin: 11.1 g/dL — ABNORMAL LOW (ref 12.0–15.0)
Immature Granulocytes: 1 %
Lymphocytes Relative: 10 %
Lymphs Abs: 0.9 K/uL (ref 0.7–4.0)
MCH: 30.3 pg (ref 26.0–34.0)
MCHC: 30.6 g/dL (ref 30.0–36.0)
MCV: 99.2 fL (ref 80.0–100.0)
Monocytes Absolute: 0.6 K/uL (ref 0.1–1.0)
Monocytes Relative: 7 %
Neutro Abs: 7.3 K/uL (ref 1.7–7.7)
Neutrophils Relative %: 77 %
Platelets: 227 K/uL (ref 150–400)
RBC: 3.66 MIL/uL — ABNORMAL LOW (ref 3.87–5.11)
RDW: 14.6 % (ref 11.5–15.5)
WBC: 9.3 K/uL (ref 4.0–10.5)
nRBC: 0 % (ref 0.0–0.2)

## 2024-04-17 LAB — COMPREHENSIVE METABOLIC PANEL WITH GFR
ALT: 9 U/L (ref 0–44)
AST: 14 U/L — ABNORMAL LOW (ref 15–41)
Albumin: 3 g/dL — ABNORMAL LOW (ref 3.5–5.0)
Alkaline Phosphatase: 78 U/L (ref 38–126)
Anion gap: 10 (ref 5–15)
BUN: 37 mg/dL — ABNORMAL HIGH (ref 8–23)
CO2: 29 mmol/L (ref 22–32)
Calcium: 9.2 mg/dL (ref 8.9–10.3)
Chloride: 101 mmol/L (ref 98–111)
Creatinine, Ser: 2.35 mg/dL — ABNORMAL HIGH (ref 0.44–1.00)
GFR, Estimated: 19 mL/min — ABNORMAL LOW (ref >60)
Glucose, Bld: 107 mg/dL — ABNORMAL HIGH (ref 70–99)
Potassium: 3.9 mmol/L (ref 3.5–5.1)
Sodium: 140 mmol/L (ref 135–145)
Total Bilirubin: 0.4 mg/dL (ref 0.0–1.2)
Total Protein: 6.2 g/dL — ABNORMAL LOW (ref 6.5–8.1)

## 2024-04-17 LAB — GLUCOSE, CAPILLARY
Glucose-Capillary: 98 mg/dL (ref 70–99)
Glucose-Capillary: 158 mg/dL — ABNORMAL HIGH (ref 70–99)
Glucose-Capillary: 111 mg/dL — ABNORMAL HIGH (ref 70–99)
Glucose-Capillary: 108 mg/dL — ABNORMAL HIGH (ref 70–99)

## 2024-04-17 LAB — MAGNESIUM: Magnesium: 2.2 mg/dL (ref 1.7–2.4)

## 2024-04-17 LAB — PHOSPHORUS: Phosphorus: 2.5 mg/dL (ref 2.5–4.6)

## 2024-04-17 NOTE — Progress Notes (Signed)
 Mobility Specialist - Progress Note   04/17/24 1039  Mobility  Activity Pivoted/transferred from bed to chair  Level of Assistance +2 (takes two people)  Press photographer wheel walker  Range of Motion/Exercises Active  Activity Response Tolerated well  Mobility Referral Yes  Mobility visit 1 Mobility  Mobility Specialist Start Time (ACUTE ONLY) 1029  Mobility Specialist Stop Time (ACUTE ONLY) 1039  Mobility Specialist Time Calculation (min) (ACUTE ONLY) 10 min   Pt found in bed and agreeable to mobilize. RN in room and assisted with transfer. At EOS was left on recliner chair with all needs met. Call bell in reach and husband in room.   Erminio Leos,  Mobility Specialist Can be reached via Secure Chat

## 2024-04-17 NOTE — Plan of Care (Signed)

## 2024-04-17 NOTE — Plan of Care (Signed)
  Problem: Education: Goal: Ability to describe self-care measures that may prevent or decrease complications (Diabetes Survival Skills Education) will improve Outcome: Progressing   Problem: Coping: Goal: Ability to adjust to condition or change in health will improve Outcome: Progressing   Problem: Nutritional: Goal: Maintenance of adequate nutrition will improve Outcome: Progressing   Problem: Education: Goal: Knowledge of General Education information will improve Description: Including pain rating scale, medication(s)/side effects and non-pharmacologic comfort measures Outcome: Progressing   Problem: Health Behavior/Discharge Planning: Goal: Ability to manage health-related needs will improve Outcome: Progressing   Problem: Activity: Goal: Risk for activity intolerance will decrease Outcome: Progressing   Problem: Coping: Goal: Level of anxiety will decrease Outcome: Progressing   Problem: Elimination: Goal: Will not experience complications related to bowel motility Outcome: Progressing

## 2024-04-17 NOTE — Progress Notes (Signed)
 PROGRESS NOTE    Pamela [name]  FMW:[id number] DOB: 01/10/1935 DOA: 04/10/2024 PCP: [name], MD   Brief Narrative:  The patient is an elderly 89 y.o. female past medical history of diabetes mellitus type 2, chronic kidney stage IV, anemia of chronic disease, A-fib PE on Eliquis , chronic diastolic heart failure with a last EF of 60%, she was recently discharged on 04/04/2024 for acute decompensated heart failure, comes into the ED with leg weakness and difficulty getting up for 2 weeks. Worked up and now stabilized for SNF and awaiting placement.   Assessment and Plan:  Ambulatory dysfunction Bilateral Lower Extremity weakness  Physical Deconditioning and Debility History of gout Severe arthritis compartmental osteoarthritis b/l knees: Since her fall in August 23 she has has not walked but able to stand only and not able to stand up today- likley multifactorial but  ruling out other etiology- she is able to bend her knees b/l intact sensation and no incontinence. Underwent workup w/ B12, RPR, TSH UA, MRI brain along with C,L,T Canceled after Dr. Odell Castor discussed with the patient and after patient declined since she had no facial droop or asymmetric weakness.  -Neuro Consulted and recommending Thiamine 100 mg po Daily and Rehab -She does have osteoarthritis, history of gout, recently treated for gout flare-with Prednisone  and colchicine-and started on allopurinol.  She gets knee injections for arthritis as well. -PT/OT recommending SNF -Head CT showed now acute Abnormalities -C/w muscle rub, resume home norco.  Combined systolic and diastolic CHF: BNP slight elevated from baseline however x-ray clear.  Not hypoxic. C/w Dapagliflozin  10 mg po Daily, Furosemide  20 mg po Daily, Losartan  100 mg po Daily, and Metoprolol  Succinate 25 mg po Daily. Titrate Medications as needed  Essential HTN: C/w Furosemide  20 mg po Daily, Losartan  100 mg po Daily, and Metoprolol  Succinate 25 mg po Daily.  CTM BP per Protocol. Last BP reading was   T2DM: C/w Sensitive Novolog  SSI AC/HS and Dapagliflozin  10 mg po Daily. CBG Trend:  Recent Labs  Lab 04/15/24 2150 04/16/24 0802 04/16/24 1156 04/16/24 1646 04/16/24 2111 04/17/24 0742 04/17/24 1252  GLUCAP 121* 102* 130* 131* 106* 98 158*   CKD stage IV: Renal function appears to be at baseline. BUN/Cr Trend: Recent Labs  Lab 04/02/24 0602 04/03/24 0502 04/04/24 0509 04/10/24 1520 04/11/24 0452 04/14/24 0432 04/17/24 1101  BUN 85* 89* 87* 36* 32* 32* 37*  CREATININE 2.02* 1.90* 1.77* 1.82* 1.81* 1.96* 2.35*  -C/w Home Furosemide  20 mg po Daily AND C/w Calcium  Carbonate 1250 mg po Daily  -Avoid Nephrotoxic Medications, Contrast Dyes, Hypotension and Dehydration to Ensure Adequate Renal Perfusion and will need to Renally Adjust Meds -Continue to Monitor and Trend Renal Function carefully and repeat CMP w/in 1 week  Anemia of Chronic Renal Disease: Hemoglobin stable. Hgb/Hct Trend:  Recent Labs  Lab 04/01/24 0510 04/02/24 0602 04/03/24 0502 04/04/24 0509 04/10/24 1520 04/11/24 0452 04/17/24 1101  HGB 9.6* 9.7* 9.1* 9.7* 9.7* 10.1* 11.1*  HCT 29.7* 30.2* 28.5* 30.8* 31.8* 32.3* 36.3  MCV  --  96.5 96.3 97.8 98.5 98.2 99.2  -C/w Ferrous Sulfate 325 mg po Daily  -Check Anemia Panel in the outpatient setting. CTM for S/Sx of Bleeding; No overt bleeding noted. Repeat CBC w/in 1 week and follow up w/ PCP w/in 1 week  Paroxysmal A-fib / History of PE: C/w Apixaban  2.5 mg po BID.  Venous stasis dermatitis: Continue leg/wound care  PAD / CAD/calcification on CT chest / Positive troponin 35:  susepct demand ischemia - was present on last admit: Patient was evaluated by cardiology and vascular last admission-no further intervention. No chest pain currently. CTM   Hx of GI bleed on last admission needing EGD: C/w Pantoprazole  40 mg po Daily  HLD: C/w Pravastatin  10 mg po qHS  Hypoalbuminemia: Patient's Albumin  Trend:  Recent Labs   Lab 03/29/24 0111 03/30/24 0529 03/31/24 0500 04/01/24 0007 04/02/24 0602 04/10/24 1520 04/17/24 1101  ALBUMIN  3.6 3.4* 3.2* 3.0* 3.0* 3.5 3.0*  -CTM and Trend and repeat CMP in the outpatient setting  Class III (Morbid) Obesity -Complicates overall prognosis and care -Estimated body mass index is 42.96 kg/m as calculated from the following:   Height as of this encounter: 5' 2 (1.575 m).   Weight as of this encounter: 106.5 kg.  -Weight Loss and Dietary Counseling given  DVT prophylaxis: apixaban  (ELIQUIS ) tablet 2.5 mg Start: 04/10/24 2200 apixaban  (ELIQUIS ) tablet 2.5 mg    Code Status: Limited: Do not attempt resuscitation (DNR) -DNR-LIMITED -Do Not Intubate/DNI  Family Communication: D/w w/ Husband @ bedside   Disposition Plan:  Level of care: Med-Surg Status is: Observation The patient remains OBS appropriate and will d/c before 2 midnights.   Consultants:  Neurology   Procedures:  As delineated as above  Antimicrobials:  Anti-infectives (From admission, onward)    None       Subjective: Seen and examined at bedside and she is sitting in the chair and felt okay.  Awaiting to go to SNF and has chosen a place and awaiting for short authorization.  No nausea or vomiting.  No other concerns or complaints at this time.  Objective: Vitals:   04/17/24 0433 04/17/24 0434 04/17/24 0908 04/17/24 1158  BP:   112/73 136/80  Pulse: 75 66 (!) 105 (!) 103  Resp:    16  Temp:    97.8 F (36.6 C)  TempSrc:      SpO2: 96% 95%  95%  Weight:      Height:        Intake/Output Summary (Last 24 hours) at 04/17/2024 1513 Last data filed at 04/17/2024 0800 Gross per 24 hour  Intake 240 ml  Output 150 ml  Net 90 ml   Filed Weights   04/14/24 0930  Weight: 106.5 kg   Examination: Physical Exam:  Constitutional: WN/WD morbidly obese elderly chronically ill-appearing Caucasian female in NAD Respiratory: Diminished to auscultation bilaterally, no wheezing, rales,  rhonchi or crackles. Normal respiratory effort and patient is not tachypenic. No accessory muscle use. Unlabored breathing  Cardiovascular: RRR, no murmurs / rubs / gallops. S1 and S2 auscultated. Mild Extremity edema Abdomen: Soft, non-tender, Distended 2/2 body habitus. Bowel sounds positive.  GU: Deferred. Musculoskeletal: No clubbing / cyanosis of digits/nails. No joint deformity upper and lower extremities.  Skin: No rashes, lesions, ulcers on a limited skin evaluation. No induration; Warm and dry.  Neurologic: CN 2-12 grossly intact with no focal deficits. Romberg sign and cerebellar reflexes not assessed.  Psychiatric: Normal judgment and insight. Alert and oriented x 3. Normal mood and appropriate affect.   Data Reviewed: I have personally reviewed following labs and imaging studies  CBC: Recent Labs  Lab 04/10/24 1520 04/11/24 0452 04/17/24 1101  WBC 12.8* 12.8* 9.3  NEUTROABS 9.9*  --  7.3  HGB 9.7* 10.1* 11.1*  HCT 31.8* 32.3* 36.3  MCV 98.5 98.2 99.2  PLT 263 256 227   Basic Metabolic Panel: Recent Labs  Lab 04/10/24 1520 04/11/24 0452 04/14/24 0432  04/17/24 1101  NA 140 142 141 140  K 3.9 3.7 3.6 3.9  CL 103 103 101 101  CO2 27 30 32 29  GLUCOSE 86 90 95 107*  BUN 36* 32* 32* 37*  CREATININE 1.82* 1.81* 1.96* 2.35*  CALCIUM  9.1 9.0 8.7* 9.2  MG 2.4  --   --  2.2  PHOS  --   --   --  2.5   GFR: Estimated Creatinine Clearance: 19.4 mL/min (A) (by C-G formula based on SCr of 2.35 mg/dL (H)). Liver Function Tests: Recent Labs  Lab 04/10/24 1520 04/17/24 1101  AST 19 14*  ALT 14 9  ALKPHOS 65 78  BILITOT 0.6 0.4  PROT 6.2* 6.2*  ALBUMIN  3.5 3.0*   No results for input(s): LIPASE, AMYLASE in the last 168 hours. No results for input(s): AMMONIA in the last 168 hours. Coagulation Profile: No results for input(s): INR, PROTIME in the last 168 hours. Cardiac Enzymes: No results for input(s): CKTOTAL, CKMB, CKMBINDEX, TROPONINI in the  last 168 hours. BNP (last 3 results) Recent Labs    03/28/24 1753 04/10/24 1520  PROBNP 698.0* 953.0*   HbA1C: No results for input(s): HGBA1C in the last 72 hours. CBG: Recent Labs  Lab 04/16/24 1156 04/16/24 1646 04/16/24 2111 04/17/24 0742 04/17/24 1252  GLUCAP 130* 131* 106* 98 158*   Lipid Profile: No results for input(s): CHOL, HDL, LDLCALC, TRIG, CHOLHDL, LDLDIRECT in the last 72 hours. Thyroid  Function Tests: No results for input(s): TSH, T4TOTAL, FREET4, T3FREE, THYROIDAB in the last 72 hours. Anemia Panel: No results for input(s): VITAMINB12, FOLATE, FERRITIN, TIBC, IRON, RETICCTPCT in the last 72 hours. Sepsis Labs: No results for input(s): PROCALCITON, LATICACIDVEN in the last 168 hours.  No results found for this or any previous visit (from the past 240 hours).   Radiology Studies: No results found.  Scheduled Meds:  acidophilus  1 capsule Oral Daily   allopurinol  100 mg Oral Daily   apixaban   2.5 mg Oral BID   calcium  carbonate  1,250 mg Oral Q breakfast   dapagliflozin  propanediol  10 mg Oral Daily   ferrous sulfate  325 mg Oral Q breakfast   furosemide   20 mg Oral Daily   insulin  aspart  0-5 Units Subcutaneous QHS   insulin  aspart  0-9 Units Subcutaneous TID WC   LORazepam   1 mg Intravenous Once   losartan   100 mg Oral Daily   metoprolol  succinate  25 mg Oral Daily   pantoprazole   40 mg Oral Daily   pravastatin   10 mg Oral QHS   thiamine  100 mg Oral Daily   Continuous Infusions:   LOS: 0 days   Alejandro Marker, DO [facility] Available via Epic secure chat 7am-7pm After these hours, please refer to coverage provider listed on [email or web address] 04/17/2024, 3:13 PM

## 2024-04-17 NOTE — TOC Progression Note (Addendum)
 Transition of Care [facility]) - Progression Note    Patient Details  Name: Pamela [name] MRN: [id number] Date of Birth: 1935-04-30  Transition of Care [facility]) CM/SW Contact  Doneta Glenys DASEN, RN Phone Number: 04/17/2024, 11:11 AM  Clinical Narrative:     CM spoke with Ryan (spouse) to confirm choice of [facility]. CM confirmed accepted bed at [facility] with [place]. Insurance auth started. 3:41 PM Auth still pending  Expected Discharge Plan: Skilled Nursing Facility Barriers to Discharge: Continued Medical Work up, SNF Pending bed offer               Expected Discharge Plan and Services In-house Referral: Clinical Social Work Discharge Planning Services: NA Post Acute Care Choice: Skilled Nursing Facility Living arrangements for the past 2 months: Single Family Home                 DME Arranged: N/A DME Agency: NA       HH Arranged: NA HH Agency: NA         Social Drivers of Health (SDOH) Interventions SDOH Screenings   Food Insecurity: No Food Insecurity (04/11/2024)  Housing: Low Risk  (04/11/2024)  Transportation Needs: No Transportation Needs (04/11/2024)  Utilities: Not At Risk (04/11/2024)  Social Connections: Moderately Isolated (04/11/2024)  Tobacco Use: Medium Risk (04/10/2024)    Readmission Risk Interventions    04/04/2024    9:24 AM  Readmission Risk Prevention Plan  Transportation Screening Complete  HRI or Home Care Consult Complete  Social Work Consult for Recovery Care Planning/Counseling Complete  Palliative Care Screening Not Applicable  Medication Review Oceanographer) Complete

## 2024-04-18 DIAGNOSIS — I10 Essential (primary) hypertension: Secondary | ICD-10-CM | POA: Diagnosis not present

## 2024-04-18 DIAGNOSIS — I739 Peripheral vascular disease, unspecified: Secondary | ICD-10-CM | POA: Diagnosis not present

## 2024-04-18 DIAGNOSIS — R569 Unspecified convulsions: Secondary | ICD-10-CM | POA: Diagnosis not present

## 2024-04-18 DIAGNOSIS — G4700 Insomnia, unspecified: Secondary | ICD-10-CM | POA: Diagnosis not present

## 2024-04-18 DIAGNOSIS — K219 Gastro-esophageal reflux disease without esophagitis: Secondary | ICD-10-CM | POA: Diagnosis not present

## 2024-04-18 DIAGNOSIS — E1122 Type 2 diabetes mellitus with diabetic chronic kidney disease: Secondary | ICD-10-CM | POA: Diagnosis not present

## 2024-04-18 DIAGNOSIS — M6281 Muscle weakness (generalized): Secondary | ICD-10-CM | POA: Diagnosis not present

## 2024-04-18 DIAGNOSIS — D631 Anemia in chronic kidney disease: Secondary | ICD-10-CM | POA: Diagnosis not present

## 2024-04-18 DIAGNOSIS — E1169 Type 2 diabetes mellitus with other specified complication: Secondary | ICD-10-CM | POA: Diagnosis not present

## 2024-04-18 DIAGNOSIS — I129 Hypertensive chronic kidney disease with stage 1 through stage 4 chronic kidney disease, or unspecified chronic kidney disease: Secondary | ICD-10-CM | POA: Diagnosis not present

## 2024-04-18 DIAGNOSIS — R2689 Other abnormalities of gait and mobility: Secondary | ICD-10-CM | POA: Diagnosis not present

## 2024-04-18 DIAGNOSIS — I872 Venous insufficiency (chronic) (peripheral): Secondary | ICD-10-CM | POA: Diagnosis not present

## 2024-04-18 DIAGNOSIS — R41841 Cognitive communication deficit: Secondary | ICD-10-CM | POA: Diagnosis not present

## 2024-04-18 DIAGNOSIS — I2699 Other pulmonary embolism without acute cor pulmonale: Secondary | ICD-10-CM | POA: Diagnosis not present

## 2024-04-18 DIAGNOSIS — I2510 Atherosclerotic heart disease of native coronary artery without angina pectoris: Secondary | ICD-10-CM | POA: Diagnosis not present

## 2024-04-18 DIAGNOSIS — M170 Bilateral primary osteoarthritis of knee: Secondary | ICD-10-CM | POA: Diagnosis not present

## 2024-04-18 DIAGNOSIS — M8580 Other specified disorders of bone density and structure, unspecified site: Secondary | ICD-10-CM | POA: Diagnosis not present

## 2024-04-18 DIAGNOSIS — E66813 Obesity, class 3: Secondary | ICD-10-CM | POA: Diagnosis not present

## 2024-04-18 DIAGNOSIS — E8809 Other disorders of plasma-protein metabolism, not elsewhere classified: Secondary | ICD-10-CM | POA: Diagnosis not present

## 2024-04-18 DIAGNOSIS — R29898 Other symptoms and signs involving the musculoskeletal system: Secondary | ICD-10-CM | POA: Diagnosis not present

## 2024-04-18 DIAGNOSIS — E785 Hyperlipidemia, unspecified: Secondary | ICD-10-CM | POA: Diagnosis not present

## 2024-04-18 DIAGNOSIS — M109 Gout, unspecified: Secondary | ICD-10-CM | POA: Diagnosis not present

## 2024-04-18 DIAGNOSIS — R531 Weakness: Secondary | ICD-10-CM | POA: Diagnosis not present

## 2024-04-18 DIAGNOSIS — I5043 Acute on chronic combined systolic (congestive) and diastolic (congestive) heart failure: Secondary | ICD-10-CM | POA: Diagnosis not present

## 2024-04-18 DIAGNOSIS — I480 Paroxysmal atrial fibrillation: Secondary | ICD-10-CM | POA: Diagnosis not present

## 2024-04-18 DIAGNOSIS — N184 Chronic kidney disease, stage 4 (severe): Secondary | ICD-10-CM | POA: Diagnosis not present

## 2024-04-18 LAB — GLUCOSE, CAPILLARY
Glucose-Capillary: 109 mg/dL — ABNORMAL HIGH (ref 70–99)
Glucose-Capillary: 124 mg/dL — ABNORMAL HIGH (ref 70–99)

## 2024-04-18 MED ORDER — SENNOSIDES-DOCUSATE SODIUM 8.6-50 MG PO TABS: 1.0000 | ORAL_TABLET | Freq: Every evening | ORAL | Status: AC | PRN

## 2024-04-18 MED ORDER — CALCIUM CARBONATE 1250 (500 CA) MG PO TABS
1250.0000 mg | ORAL_TABLET | Freq: Every day | ORAL | Status: AC
Start: 2024-04-19 — End: ?

## 2024-04-18 MED ORDER — APIXABAN 2.5 MG PO TABS: 2.5000 mg | ORAL_TABLET | Freq: Two times a day (BID) | ORAL | Status: DC

## 2024-04-18 MED ORDER — VITAMIN B-1 100 MG PO TABS: 100.0000 mg | ORAL_TABLET | Freq: Every day | ORAL | Status: AC

## 2024-04-18 MED ORDER — FERROUS SULFATE 325 (65 FE) MG PO TABS: 325.0000 mg | ORAL_TABLET | Freq: Every day | ORAL | Status: AC

## 2024-04-18 NOTE — Progress Notes (Signed)
 Physical Therapy Treatment Patient Details Name: Pamela [name] MRN: [id number] DOB: 30-Jun-1935 Today's Date: 04/18/2024   History of Present Illness 89 yo female presents to ED 04/10/24, just DC'd 10/2, with LE weakness, inability to walk. H/O fall several weeks prior. PMH : HF, arthritis, HTN, DM II, CKD IIIb, HLD and PE, GIB, gout, PAF, venous stasis dermatitis, anemia    PT Comments  Pt reports anxiousness at beginning of session despite premedicating. Maximum relaxation cues throughout session with little improvement. Pt with increased shakiness with static standing, appears to be whole body involuntary shaking, denies dizziness/lightheadedness or pain, reports strong fear of falling. Attempted rapid sit to stand reps with RW, min A +2 for safety to power up but pt only able to complete 3 reps back to back before needing seated rest break then able to perform additional. Pt able to maintain static standing for up to 25 sec max this session with RW for UE support. Pt takes a couple sidesteps at bedside up to [facility], unable to leave bedside, increased shakiness and pt returns to sitting at bedside. Pt in supine at EOS with all needs in reach. Pt reports d/c to SNF later today.   If plan is discharge home, recommend the following: Two people to help with walking and/or transfers;A lot of help with bathing/dressing/bathroom;Assistance with cooking/housework;Assist for transportation;Help with stairs or ramp for entrance   Can travel by private vehicle     No  Equipment Recommendations  None recommended by PT    Recommendations for Other Services       Precautions / Restrictions Precautions Precautions: Fall Recall of Precautions/Restrictions: Intact Restrictions Weight Bearing Restrictions Per Provider Order: No     Mobility  Bed Mobility Overal bed mobility: Needs Assistance Bed Mobility: Supine to Sit     Supine to sit: HOB elevated, Used rails, Min assist Sit to supine: Mod  assist   General bed mobility comments: pt using bedrails and elevated HOB to mobilize to bedside, min A with bedpad to scoot hips forward to place feet on floor; mod A to lift BLE back into bed and +2 to scoot up in bed with bedpad and trendelenburg    Transfers Overall transfer level: Needs assistance Equipment used: Rolling walker (2 wheels) Transfers: Sit to/from Stand Sit to Stand: Min assist, +2 safety/equipment           General transfer comment: min A +2 for safety as pt with significant fear of falling and anxiousness, cues for hand placement to power up and LE engagement to assist in rising    Ambulation/Gait Ambulation/Gait assistance: Mod assist   Assistive device: Rolling walker (2 wheels)         General Gait Details: pt takes 2 sidesteps up to [facility] wtih mod A+2 for safety, assist for RW management, increased shakiness requiring return to sitting and unable to take steps away from bedside   Stairs             Wheelchair Mobility     Tilt Bed    Modified Rankin (Stroke Patients Only)       Balance Overall balance assessment: Needs assistance Sitting-balance support: Feet unsupported, No upper extremity supported Sitting balance-Leahy Scale: Fair     Standing balance support: Bilateral upper extremity supported, During functional activity, Reliant on assistive device for balance Standing balance-Leahy Scale: Poor  Communication Communication Communication: No apparent difficulties  Cognition Arousal: Alert Behavior During Therapy: Anxious   PT - Cognitive impairments: No apparent impairments                       PT - Cognition Comments: pt verbalizing fear of falling, constant relaxation cues and pre-education to improve anxiousness with poor carryover Following commands: Intact      Cueing Cueing Techniques: Verbal cues  Exercises General Exercises - Lower Extremity Ankle  Circles/Pumps: AROM, Both, 10 reps, Supine Heel Slides: AROM, Both, 10 reps, Supine Other Exercises Other Exercises: STS 10 reps with RW    General Comments        Pertinent Vitals/Pain Pain Assessment Pain Assessment: Faces Faces Pain Scale: Hurts a little bit Pain Location: bil knees Pain Descriptors / Indicators:  (stiff) Pain Intervention(s): Limited activity within patient's tolerance, Monitored during session, Premedicated before session, Repositioned, Relaxation    Home Living                          Prior Function            PT Goals (current goals can now be found in the care plan section) Acute Rehab PT Goals Patient Stated Goal: go home, stand up PT Goal Formulation: With patient/family Time For Goal Achievement: 04/25/24 Potential to Achieve Goals: Fair Progress towards PT goals: Progressing toward goals    Frequency    Min 2X/week      PT Plan      Co-evaluation              AM-PAC PT 6 Clicks Mobility   Outcome Measure  Help needed turning from your back to your side while in a flat bed without using bedrails?: A Little Help needed moving from lying on your back to sitting on the side of a flat bed without using bedrails?: A Lot Help needed moving to and from a bed to a chair (including a wheelchair)?: A Lot Help needed standing up from a chair using your arms (e.g., wheelchair or bedside chair)?: A Lot Help needed to walk in hospital room?: Total Help needed climbing 3-5 steps with a railing? : Total 6 Click Score: 11    End of Session Equipment Utilized During Treatment: Gait belt;Oxygen  Activity Tolerance: Patient tolerated treatment well;Other (comment) (limited by anxiousness) Patient left: in bed;with call bell/phone within reach Nurse Communication: Mobility status PT Visit Diagnosis: Unsteadiness on feet (R26.81);Other abnormalities of gait and mobility (R26.89);Repeated falls (R29.6);Muscle weakness (generalized)  (M62.81);Pain Pain - Right/Left: Right Pain - part of body: Knee     Time: 1146-1209 PT Time Calculation (min) (ACUTE ONLY): 23 min  Charges:    $Therapeutic Exercise: 8-22 mins $Therapeutic Activity: 8-22 mins PT General Charges $$ ACUTE PT VISIT: 1 Visit                     Tori Jameer Storie PT, DPT 04/18/24, 12:26 PM

## 2024-04-18 NOTE — Discharge Summary (Signed)
 Physician Discharge Summary   Patient: Pamela Pamela [name] MRN: [id number] DOB: 1935-04-09  Admit date:     04/10/2024  Discharge date: 04/18/24  Discharge Physician: Pamela Marker, DO   PCP: Pamela Senior, MD   Recommendations at discharge:   Follow-up with PCP within 1 to 2 weeks repeat CBC, CMP, mag, Phos within 1 week Follow-up with Nephrology in outpatient setting  Discharge Diagnoses: Principal Problem:   Weakness Active Problems:   Primary hypertension   CKD (chronic kidney disease) stage 3, GFR 30-59 ml/min (HCC)   Obesity, Class III, BMI 40-49.9 (morbid obesity) (HCC)   Type 2 diabetes mellitus, without long-term current use of insulin  (HCC)   Bilateral pulmonary embolism (HCC)   PAF (paroxysmal atrial fibrillation) (HCC)   Elevated troponin   Generalized weakness  Resolved Problems:   * No resolved hospital problems. [facility] Course: The patient is Pamela [name] elderly 89 y.o. female past medical history of diabetes mellitus type 2, chronic kidney stage IV, anemia of chronic disease, A-fib PE on Eliquis , chronic diastolic heart failure with a last EF of 60%, she was recently discharged on 04/04/2024 for acute decompensated heart failure, comes into the ED with leg weakness and difficulty getting up for 2 weeks. Worked up and now stabilized for SNF and now has Pamela [name] Engineer, [employer].  Assessment and Plan:  Ambulatory Dysfunction Bilateral Lower Extremity weakness  Physical Deconditioning and Debility History of gout Severe arthritis compartmental osteoarthritis b/l knees: Since her fall in August 23 she has has not walked but able to stand only and not able to stand up today- likley multifactorial but  ruling out other etiology- she is able to bend her knees b/l intact sensation and no incontinence. Underwent workup w/ B12, RPR, TSH UA, MRI brain along with C,L,T Canceled after Dr. Odell Pamela [name] discussed with the patient and after patient declined since she had  no facial droop or asymmetric weakness.  -Neuro Consulted and recommending Thiamine 100 mg po Daily and Rehab -She does have osteoarthritis, history of gout, recently treated for gout flare-with Prednisone  and colchicine-and started on allopurinol.  She gets knee injections for arthritis as well. -PT/OT recommending SNF -Head CT showed now acute Abnormalities -C/w muscle rub, resume home norco.  Combined systolic and diastolic CHF: BNP slight elevated from baseline however x-ray clear.  Not hypoxic. C/w Dapagliflozin  10 mg po Daily, Furosemide  20 mg po Daily, and Metoprolol  Succinate 25 mg po Daily.  Holding her Losartan  25 mg p.o. daily given her renal dysfunction.  Titrate Medications as needed  Essential HTN: C/w Furosemide  20 mg po Daily, and Metoprolol  Succinate 25 mg po Daily.  Hold her Losartan  25 mg p.o. daily given her renal dysfunction CTM BP per Protocol. Last BP reading was 127/52  T2DM: C/w Sensitive Novolog  SSI AC/HS and Dapagliflozin  10 mg po Daily. CBG Trend:  Recent Labs  Lab 04/16/24 1646 04/16/24 2111 04/17/24 0742 04/17/24 1252 04/17/24 1543 04/17/24 2114 04/18/24 0804  GLUCAP 131* 106* 98 158* 111* 108* 109*   CKD stage IV: Renal function appears to be at baseline. BUN/Cr Trend: Recent Labs  Lab 04/02/24 0602 04/03/24 0502 04/04/24 0509 04/10/24 1520 04/11/24 0452 04/14/24 0432 04/17/24 1101  BUN 85* 89* 87* 36* 32* 32* 37*  CREATININE 2.02* 1.90* 1.77* 1.82* 1.81* 1.96* 2.35*  -C/w Home Furosemide  20 mg po Daily AND C/w Calcium  Carbonate 1250 mg po Daily; hold your Losartan  25 mg p.o. daily -Avoid Nephrotoxic Medications, Contrast Dyes, Hypotension and Dehydration to  Ensure Adequate Renal Perfusion and will need to Renally Adjust Meds -Continue to Monitor and Trend Renal Function carefully and repeat CMP w/in 1 week  Anemia of Chronic Renal Disease: Hemoglobin stable. Hgb/Hct Trend:  Recent Labs  Lab 04/01/24 0510 04/02/24 0602 04/03/24 0502  04/04/24 0509 04/10/24 1520 04/11/24 0452 04/17/24 1101  HGB 9.6* 9.7* 9.1* 9.7* 9.7* 10.1* 11.1*  HCT 29.7* 30.2* 28.5* 30.8* 31.8* 32.3* 36.3  MCV  --  96.5 96.3 97.8 98.5 98.2 99.2  -C/w Ferrous Sulfate 325 mg po Daily  -Check Anemia Panel in the outpatient setting. CTM for S/Sx of Bleeding; No overt bleeding noted. Repeat CBC w/in 1 week and follow up w/ PCP w/in 1 week  Paroxysmal A-fib / History of PE: C/w Apixaban  2.5 mg po BID.  Venous stasis dermatitis: Continue leg/wound care  PAD / CAD/calcification on CT chest / Positive troponin 35: susepct demand ischemia - was present on last admit: Patient was evaluated by cardiology and vascular last admission-no further intervention. No chest pain currently. CTM   Hx of GI bleed on last admission needing EGD: C/w Pantoprazole  40 mg po Daily  HLD: C/w Pravastatin  10 mg po qHS  Hypoalbuminemia: Patient's Albumin  Trend:  Recent Labs  Lab 03/29/24 0111 03/30/24 0529 03/31/24 0500 04/01/24 0007 04/02/24 0602 04/10/24 1520 04/17/24 1101  ALBUMIN  3.6 3.4* 3.2* 3.0* 3.0* 3.5 3.0*  -CTM and Trend and repeat CMP in the outpatient setting  Class III (Morbid) Obesity: Complicates overall prognosis and care. Estimated body mass index is 42.96 kg/m as calculated from the following:   Height as of this encounter: 5' 2 (1.575 m).   Weight as of this encounter: 106.5 kg. Weight Loss and Dietary Counseling givenConsultants: Neurology  Procedures performed: As delineated as above   Disposition: Skilled nursing facility  Diet recommendation:  Cardiac and Carb modified diet DISCHARGE MEDICATION: Allergies as of 04/18/2024       Reactions   Levofloxacin Nausea And Vomiting, Other (See Comments)   Abdominal pain, also   Lisinopril Cough   Oxycodone  Nausea Only        Medication List     STOP taking these medications    calcium  carbonate 1500 (600 Ca) MG Tabs tablet Commonly known as: OSCAL   ferrous sulfate 325 (65 FE) MG  EC tablet       TAKE these medications    Align 4 MG Caps Take 4 mg by mouth daily.   allopurinol 100 MG tablet Commonly known as: ZYLOPRIM Take 1 tablet (100 mg total) by mouth daily.   ALPRAZolam  0.25 MG tablet Commonly known as: XANAX  Take 1 tablet (0.25 mg total) by mouth 2 (two) times daily as needed for anxiety.   apixaban  2.5 MG Tabs tablet Commonly known as: ELIQUIS  Take 1 tablet (2.5 mg total) by mouth 2 (two) times daily. What changed: Another medication with the same name was removed. Continue taking this medication, and follow the directions you see here.   dapagliflozin  propanediol 10 MG Tabs tablet Commonly known as: FARXIGA  Take 10 mg by mouth daily.   diclofenac  Sodium 1 % Gel Commonly known as: VOLTAREN  Apply 2 g topically 4 (four) times daily. What changed:  when to take this reasons to take this   furosemide  20 MG tablet Commonly known as: LASIX  Take 1 tablet (20 mg total) by mouth daily.   HYDROcodone -acetaminophen  5-325 MG tablet Commonly known as: NORCO/VICODIN Take 1 tablet by mouth every 6 (six) hours as needed for moderate pain (pain  score 4-6) or severe pain (pain score 7-10).   losartan  25 MG tablet Commonly known as: COZAAR  Take 25 mg by mouth daily.   lovastatin 20 MG tablet Commonly known as: MEVACOR Take 20 mg by mouth at bedtime.   metoprolol  succinate 25 MG 24 hr tablet Commonly known as: TOPROL -XL Take 1 tablet (25 mg total) by mouth daily. Take with or immediately following a meal.   multivitamin with minerals Tabs tablet Take 1 tablet by mouth daily.   pantoprazole  40 MG tablet Commonly known as: Protonix  Take 1 tablet (40 mg total) by mouth 2 (two) times daily.   traZODone  50 MG tablet Commonly known as: DESYREL  Take 100 mg by mouth at bedtime as needed for sleep.   Tylenol  8 Hour Arthritis Pain 650 MG CR tablet Generic drug: acetaminophen  Take 1,300 mg by mouth in the morning and at bedtime.        Contact  information for after-discharge care     Destination     [facility] .   Service: Skilled Nursing Contact information: [address] [place]  [address] [phone]                    Discharge Exam: Fredricka Weights   04/14/24 0930  Weight: 106.5 kg   Vitals:   04/18/24 0616 04/18/24 1041  BP: (!) 127/52 (!) 127/52  Pulse: 67 67  Resp:    Temp:    SpO2:     Examination: Physical Exam:  Constitutional: WN/WD, morbidly obese elderly chronically ill-appearing Caucasian female who is Anxious  Respiratory: Diminished to auscultation bilaterally, no wheezing, rales, rhonchi or crackles. Normal respiratory effort and patient is not tachypenic. No accessory muscle use. Unlabored breathing  Cardiovascular: RRR, no murmurs / rubs / gallops. S1 and S2 auscultated. Mild LE edema Abdomen: Soft, non-tender, Distended 2/2 body habitus. Bowel sounds positive.  GU: Deferred. Musculoskeletal: No clubbing / cyanosis of digits/nails. No joint deformity upper and lower extremities.  Skin: No rashes, lesions, ulcers on a limited skin evaluation. No induration; Warm and dry.  Neurologic: CN 2-12 grossly intact with no focal deficits. Romberg sign cerebellar reflexes not assessed.  Psychiatric: Normal judgment and insight. Alert and oriented x 3. Anxious mood and affect   Condition at discharge: stable  The results of significant diagnostics from this hospitalization (including imaging, microbiology, ancillary and laboratory) are listed below for reference.   Imaging Studies: CT Head Wo Contrast Result Date: 04/10/2024 CLINICAL DATA:  Neuro deficit, acute, stroke suspected Extremity weakness, Neuro deficit, acute, stroke suspected EXAM: CT HEAD WITHOUT CONTRAST TECHNIQUE: Contiguous axial images were obtained from the base of the skull through the vertex without intravenous contrast. RADIATION DOSE REDUCTION: This exam was performed according to the departmental  dose-optimization program which includes automated exposure control, adjustment of the mA and/or kV according to patient size and/or use of iterative reconstruction technique. COMPARISON:  None Available. FINDINGS: Brain: Patchy and confluent areas of decreased attenuation are noted throughout the deep and periventricular white matter of the cerebral hemispheres bilaterally, compatible with chronic microvascular ischemic disease. No evidence of large-territorial acute infarction. No parenchymal hemorrhage. No mass lesion. No extra-axial collection. No mass effect or midline shift. No hydrocephalus. Basilar cisterns are patent. Vascular: No hyperdense vessel. Atherosclerotic calcifications are present within the cavernous internal carotid arteries. Skull: No acute fracture or focal lesion. Sinuses/Orbits: Paranasal sinuses and mastoid air cells are clear. Bilateral lens replacement. Otherwise the orbits are unremarkable. Other: None. IMPRESSION:  No acute intracranial abnormality. Electronically Signed   By: Morgane  Naveau M.D.   On: 04/10/2024 19:42   DG Chest Portable 1 View Result Date: 04/10/2024 CLINICAL DATA:  Leg weakness. Difficulty getting up and down for 2 weeks. EXAM: PORTABLE CHEST 1 VIEW COMPARISON:  03/28/2024 FINDINGS: Shallow inspiration. Cardiac enlargement with pulmonary vascular congestion. Atelectasis in the lung bases. No edema or consolidation. Similar appearance to previous study. No pleural effusion or pneumothorax. Calcified and tortuous aorta. Old right rib fractures. Degenerative changes in the spine and shoulders. IMPRESSION: Shallow inspiration with atelectasis in the lung bases. Cardiac enlargement with mild vascular congestion. No edema or consolidation. Similar appearance to previous study. Electronically Signed   By: Elsie Gravely M.D.   On: 04/10/2024 17:21   VAS US  LOWER EXTREMITY VENOUS (DVT) Result Date: 03/31/2024  Lower Venous DVT Study Patient Name:  Pamela Pamela [name]   Date of Exam:   03/31/2024 Medical Rec #: [id number]       Accession #:    [id number] Date of Birth: 1935/01/22        Patient Gender: F Patient Age:   58 years Exam Location:  [facility] Procedure:      VAS US  LOWER EXTREMITY VENOUS (DVT) Referring Phys: BLEASE DOUTOVA --------------------------------------------------------------------------------  Indications: Swelling, Pain, and pulmonary embolism.  Risk Factors: Confirmed PE obesity. Anticoagulation: Eliquis . Comparison Study: 02/16/22 -Negative left Lower ext                   01/01/22 - Negative right lwer ext Performing Technologist: Ricka Sturdivant-Jones RDMS, RVT  Examination Guidelines: A complete evaluation includes B-mode imaging, spectral Doppler, color Doppler, and power Doppler as needed of all accessible portions of each vessel. Bilateral testing is considered Pamela [name] integral part of a complete examination. Limited examinations for reoccurring indications may be performed as noted. The reflux portion of the exam is performed with the patient in reverse Trendelenburg.  +---------+---------------+---------+-----------+----------+--------------+ RIGHT    CompressibilityPhasicitySpontaneityPropertiesThrombus Aging +---------+---------------+---------+-----------+----------+--------------+ CFV      Full           Yes      Yes                                 +---------+---------------+---------+-----------+----------+--------------+ SFJ      Full                                                        +---------+---------------+---------+-----------+----------+--------------+ FV Prox  Full                                                        +---------+---------------+---------+-----------+----------+--------------+ FV Mid   Full           Yes      Yes                                 +---------+---------------+---------+-----------+----------+--------------+ FV DistalFull                                                         +---------+---------------+---------+-----------+----------+--------------+  PFV      Full                                                        +---------+---------------+---------+-----------+----------+--------------+ POP      Full           Yes      Yes                                 +---------+---------------+---------+-----------+----------+--------------+ PTV      Full                                                        +---------+---------------+---------+-----------+----------+--------------+ PERO     Full                                                        +---------+---------------+---------+-----------+----------+--------------+   +---------+---------------+---------+-----------+----------+--------------+ LEFT     CompressibilityPhasicitySpontaneityPropertiesThrombus Aging +---------+---------------+---------+-----------+----------+--------------+ CFV      Full           Yes      Yes                                 +---------+---------------+---------+-----------+----------+--------------+ SFJ      Full                                                        +---------+---------------+---------+-----------+----------+--------------+ FV Prox  Full                                                        +---------+---------------+---------+-----------+----------+--------------+ FV Mid   Full           Yes      Yes                                 +---------+---------------+---------+-----------+----------+--------------+ FV DistalFull                                                        +---------+---------------+---------+-----------+----------+--------------+ PFV      Full                                                        +---------+---------------+---------+-----------+----------+--------------+  POP      Full           Yes      Yes                                  +---------+---------------+---------+-----------+----------+--------------+ PTV      Full                                                        +---------+---------------+---------+-----------+----------+--------------+ PERO     Full                                                        +---------+---------------+---------+-----------+----------+--------------+     Summary: BILATERAL: - No evidence of deep vein thrombosis seen in the lower extremities, bilaterally. -No evidence of popliteal cyst, bilaterally.   *See table(s) above for measurements and observations. Electronically signed by Lonni Gaskins MD on 03/31/2024 at 2:39:33 PM.    Final    DG Knee Right Port Result Date: 03/30/2024 EXAM: 1 or 2 VIEW(S) XRAY OF THE KNEE 03/30/2024 05:56:00 PM COMPARISON: None available. CLINICAL HISTORY: Pain 144615. Fall and pain. FINDINGS: BONES AND JOINTS: No acute fracture. No focal osseous lesion. No joint dislocation. Small joint effusion. Moderate to severe tricompartmental degenerative changes with joint space narrowing and osteophyte formation. SOFT TISSUES: Vascular calcifications. IMPRESSION: 1. Moderate to severe tricompartmental osteoarthritis with joint space narrowing and osteophyte formation. 2. Small knee joint effusion. Electronically signed by: Norman Gatlin MD 03/30/2024 07:23 PM EDT RP Workstation: [id number]   DG Ankle Right Port Result Date: 03/30/2024 EXAM: 1 or 2 VIEW(S) XRAY OF THE ANKLE 03/30/2024 05:56:00 PM CLINICAL HISTORY: Pain 855384. Fall and pain. COMPARISON: None available. FINDINGS: BONES AND JOINTS: No acute fracture. No focal osseous lesion. No joint dislocation. Plantar calcaneal spur. Tibiotalar joint space narrowing with marginal osteophytes. SOFT TISSUES: Subcutaneous soft tissue edema. IMPRESSION: 1. No acute osseous abnormality. Electronically signed by: Norman Gatlin MD 03/30/2024 07:22 PM EDT RP Workstation: [id number]   ECHOCARDIOGRAM COMPLETE Result  Date: 03/29/2024    ECHOCARDIOGRAM REPORT   Patient Name:   LUCEE BRISSETT Date of Exam: 03/29/2024 Medical Rec #:  [id number]      Height:       62.0 in Accession #:    [id number]     Weight:       255.5 lb Date of Birth:  10-23-1934       BSA:          2.121 m Patient Age:    89 years       BP:           150/77 mmHg Patient Gender: F              HR:           96 bpm. Exam Location:  Inpatient Procedure: 2D Echo, Cardiac Doppler, Color Doppler and Strain Analysis (Both            Spectral and Color Flow Doppler were utilized during procedure). Indications:    CHF I50.21  History:  Patient has prior history of Echocardiogram examinations, most                 recent 12/29/2021. CHF, Signs/Symptoms:Edema and Shortness of                 Breath; Risk Factors:Diabetes.  Sonographer:    BERNARDA ROCKS Referring Phys: Domingo.Diones ANASTASSIA DOUTOVA IMPRESSIONS  1. Left ventricular ejection fraction, by estimation, is 60 to 65%. The left ventricle has normal function. The left ventricle has no regional wall motion abnormalities. There is mild left ventricular hypertrophy. Left ventricular diastolic parameters are consistent with Grade I diastolic dysfunction (impaired relaxation). The average left ventricular global longitudinal strain is -15.5 %. The global longitudinal strain is abnormal.  2. Right ventricular systolic function is normal. The right ventricular size is normal.  3. The mitral valve is normal in structure. No evidence of mitral valve regurgitation. No evidence of mitral stenosis.  4. The aortic valve is normal in structure. Aortic valve regurgitation is not visualized. No aortic stenosis is present.  5. The inferior vena cava is normal in size with greater than 50% respiratory variability, suggesting right atrial pressure of 3 mmHg. FINDINGS  Left Ventricle: Left ventricular ejection fraction, by estimation, is 60 to 65%. The left ventricle has normal function. The left ventricle has no regional wall motion  abnormalities. The average left ventricular global longitudinal strain is -15.5 %. Strain was performed and the global longitudinal strain is abnormal. The left ventricular internal cavity size was normal in size. There is mild left ventricular hypertrophy. Left ventricular diastolic parameters are consistent with Grade I diastolic dysfunction (impaired relaxation). Right Ventricle: The right ventricular size is normal. No increase in right ventricular wall thickness. Right ventricular systolic function is normal. Left Atrium: Left atrial size was normal in size. Right Atrium: Right atrial size was normal in size. Pericardium: There is no evidence of pericardial effusion. Mitral Valve: The mitral valve is normal in structure. Mild mitral annular calcification. No evidence of mitral valve regurgitation. No evidence of mitral valve stenosis. MV peak gradient, 8.3 mmHg. The mean mitral valve gradient is 4.0 mmHg. Tricuspid Valve: The tricuspid valve is normal in structure. Tricuspid valve regurgitation is not demonstrated. No evidence of tricuspid stenosis. Aortic Valve: The aortic valve is normal in structure. Aortic valve regurgitation is not visualized. No aortic stenosis is present. Aortic valve mean gradient measures 3.0 mmHg. Aortic valve peak gradient measures 6.7 mmHg. Aortic valve area, by VTI measures 2.61 cm. Pulmonic Valve: The pulmonic valve was normal in structure. Pulmonic valve regurgitation is trivial. No evidence of pulmonic stenosis. Aorta: The aortic root is normal in size and structure. Venous: The inferior vena cava is normal in size with greater than 50% respiratory variability, suggesting right atrial pressure of 3 mmHg. IAS/Shunts: No atrial level shunt detected by color flow Doppler.  LEFT VENTRICLE PLAX 2D LVIDd:         4.00 cm      Diastology LVIDs:         3.00 cm      LV e' medial:    5.97 cm/s LV PW:         1.20 cm      LV E/e' medial:  14.2 LV IVS:        1.30 cm      LV e' lateral:    8.39 cm/s LVOT diam:     1.90 cm      LV E/e' lateral: 10.1 LV SV:  62 LV SV Index:   29           2D Longitudinal Strain LVOT Area:     2.84 cm     2D Strain GLS Avg:     -15.5 %  LV Volumes (MOD) LV vol d, MOD A2C: 183.0 ml LV vol d, MOD A4C: 146.0 ml LV vol s, MOD A2C: 59.3 ml LV vol s, MOD A4C: 50.8 ml LV SV MOD A2C:     123.7 ml LV SV MOD A4C:     146.0 ml LV SV MOD BP:      107.6 ml RIGHT VENTRICLE             IVC RV Basal diam:  3.40 cm     IVC diam: 1.50 cm RV S prime:     24.40 cm/s TAPSE (M-mode): 2.8 cm LEFT ATRIUM             Index        RIGHT ATRIUM          Index LA diam:        3.90 cm 1.84 cm/m   RA Area:     9.95 cm LA Vol (A2C):   44.3 ml 20.88 ml/m  RA Volume:   19.90 ml 9.38 ml/m LA Vol (A4C):   41.1 ml 19.37 ml/m LA Biplane Vol: 44.4 ml 20.93 ml/m  AORTIC VALVE                    PULMONIC VALVE AV Area (Vmax):    2.57 cm     PV Vmax:          1.33 m/s AV Area (Vmean):   2.27 cm     PV Peak grad:     7.1 mmHg AV Area (VTI):     2.61 cm     PR End Diast Vel: 2.81 msec AV Vmax:           129.00 cm/s AV Vmean:          76.800 cm/s AV VTI:            0.236 m AV Peak Grad:      6.7 mmHg AV Mean Grad:      3.0 mmHg LVOT Vmax:         117.00 cm/s LVOT Vmean:        61.600 cm/s LVOT VTI:          0.217 m LVOT/AV VTI ratio: 0.92  AORTA Ao Root diam: 2.60 cm Ao Asc diam:  3.40 cm MITRAL VALVE                TRICUSPID VALVE MV Area (PHT): 4.99 cm     TR Peak grad:   32.7 mmHg MV Area VTI:   2.04 cm     TR Vmax:        286.00 cm/s MV Peak grad:  8.3 mmHg MV Mean grad:  4.0 mmHg     SHUNTS MV Vmax:       1.44 m/s     Systemic VTI:  0.22 m MV Vmean:      89.4 cm/s    Systemic Diam: 1.90 cm MV Decel Time: 152 msec MV E velocity: 85.00 cm/s MV A velocity: 149.00 cm/s MV E/A ratio:  0.57 Oneil Parchment MD Electronically signed by Oneil Parchment MD Signature Date/Time: 03/29/2024/3:50:25 PM    Final    NM Pulmonary Perfusion Result Date: 03/29/2024 CLINICAL DATA:  Pulmonary  embolism (PE) suspected,  low to intermediate prob, positive D-dimer History of pulmonary embolism. EXAM: NUCLEAR MEDICINE PERFUSION LUNG SCAN TECHNIQUE: Perfusion images were obtained in multiple projections after intravenous injection of radiopharmaceutical. No ventilation imaging performed. RADIOPHARMACEUTICALS:  3.2 mCi Tc-69m MAA IV COMPARISON:  Chest radiographs 03/28/2024.  Chest CTA 04/30/2021. FINDINGS: Low lung volumes with elevation of the right hemidiaphragm. Lateral and oblique views are limited by body habitus. There are no wedge-shaped perfusion defects to suggest acute pulmonary embolism. IMPRESSION: No evidence of acute pulmonary embolism on perfusion scintigraphy by PISAPED criteria. Electronically Signed   By: Elsie Perone M.D.   On: 03/29/2024 14:27   DG Chest Port 1 View Result Date: 03/28/2024 CLINICAL DATA:  Lower extremity swelling.  Fell 5 weeks ago. EXAM: PORTABLE CHEST 1 VIEW COMPARISON:  12/28/2021 FINDINGS: Shallow inspiration with atelectasis in the lung bases. Cardiac enlargement with mild vascular congestion. No obvious edema or consolidation. No pleural effusion or pneumothorax. Mediastinal contours appear intact. Calcified and tortuous aorta. Degenerative changes in the spine and shoulders. IMPRESSION: Shallow inspiration with atelectasis in the lung bases. Cardiac enlargement with pulmonary vascular congestion. No edema. Electronically Signed   By: Elsie Gravely M.D.   On: 03/28/2024 17:17   Microbiology: Results for orders placed or performed during the hospital encounter of 03/28/24  Respiratory (~20 pathogens) panel by PCR     Status: None   Collection Time: 03/29/24 12:30 AM   Specimen: Nasopharyngeal Swab; Respiratory  Result Value Ref Range Status   Adenovirus NOT DETECTED NOT DETECTED Final   Coronavirus 229E NOT DETECTED NOT DETECTED Final    Comment: (NOTE) The Coronavirus on the Respiratory Panel, DOES NOT test for the novel  Coronavirus (2019 nCoV)    Coronavirus HKU1 NOT  DETECTED NOT DETECTED Final   Coronavirus NL63 NOT DETECTED NOT DETECTED Final   Coronavirus OC43 NOT DETECTED NOT DETECTED Final   Metapneumovirus NOT DETECTED NOT DETECTED Final   Rhinovirus / Enterovirus NOT DETECTED NOT DETECTED Final   Influenza A NOT DETECTED NOT DETECTED Final   Influenza B NOT DETECTED NOT DETECTED Final   Parainfluenza Virus 1 NOT DETECTED NOT DETECTED Final   Parainfluenza Virus 2 NOT DETECTED NOT DETECTED Final   Parainfluenza Virus 3 NOT DETECTED NOT DETECTED Final   Parainfluenza Virus 4 NOT DETECTED NOT DETECTED Final   Respiratory Syncytial Virus NOT DETECTED NOT DETECTED Final   Bordetella pertussis NOT DETECTED NOT DETECTED Final   Bordetella Parapertussis NOT DETECTED NOT DETECTED Final   Chlamydophila pneumoniae NOT DETECTED NOT DETECTED Final   Mycoplasma pneumoniae NOT DETECTED NOT DETECTED Final    Comment: Performed at [facility], [address], [place], [place] [address]  SARS Coronavirus 2 by RT PCR (hospital order, performed in [facility] hospital lab) *cepheid single result test* Nasopharyngeal Swab     Status: None   Collection Time: 03/29/24 12:30 AM   Specimen: Nasopharyngeal Swab; Nasal Swab  Result Value Ref Range Status   SARS Coronavirus 2 by RT PCR NEGATIVE NEGATIVE Final    Comment: (NOTE) SARS-CoV-2 target nucleic acids are NOT DETECTED.  The SARS-CoV-2 RNA is generally detectable in upper and lower respiratory specimens during the acute phase of infection. The lowest concentration of SARS-CoV-2 viral copies this assay can detect is 250 copies / mL. A negative result does not preclude SARS-CoV-2 infection and should not be used as the sole basis for treatment or other patient management decisions.  A negative result may occur with improper  specimen collection / handling, submission of specimen other than nasopharyngeal swab, presence of viral mutation(s) within the areas targeted by this assay, and inadequate number of  viral copies (<250 copies / mL). A negative result must be combined with clinical observations, patient history, and epidemiological information.  Fact Sheet for Patients:   RoadLapTop.co.za  Fact Sheet for Healthcare Providers: http://kim-miller.com/  This test is not yet approved or  cleared by the [place]  FDA and has been authorized for detection and/or diagnosis of SARS-CoV-2 by FDA under Pamela [name] Emergency Use Authorization (EUA).  This EUA will remain in effect (meaning this test can be used) for the duration of the COVID-19 declaration under Section 564(b)(1) of the Act, 21 U.S.C. section 360bbb-3(b)(1), unless the authorization is terminated or revoked sooner.  Performed at [facility], [address], [place], [place] [address]    Labs: CBC: Recent Labs  Lab 04/17/24 1101  WBC 9.3  NEUTROABS 7.3  HGB 11.1*  HCT 36.3  MCV 99.2  PLT 227   Basic Metabolic Panel: Recent Labs  Lab 04/14/24 0432 04/17/24 1101  NA 141 140  K 3.6 3.9  CL 101 101  CO2 32 29  GLUCOSE 95 107*  BUN 32* 37*  CREATININE 1.96* 2.35*  CALCIUM  8.7* 9.2  MG  --  2.2  PHOS  --  2.5   Liver Function Tests: Recent Labs  Lab 04/17/24 1101  AST 14*  ALT 9  ALKPHOS 78  BILITOT 0.4  PROT 6.2*  ALBUMIN  3.0*   CBG: Recent Labs  Lab 04/17/24 0742 04/17/24 1252 04/17/24 1543 04/17/24 2114 04/18/24 0804  GLUCAP 98 158* 111* 108* 109*   Discharge time spent: greater than 30 minutes.  Signed: Alejandro Marker, DO [facility] 04/18/2024

## 2024-04-18 NOTE — Plan of Care (Incomplete)

## 2024-04-18 NOTE — Progress Notes (Signed)
 Writer called [employer] to give report to the nurse that's going to receive patient at the facility and no answer. Phone rang several time. Will attempt to call again.

## 2024-04-18 NOTE — Progress Notes (Addendum)
   04/18/24 0834  SNF Authorization Status  Date SNF Authorization Complete 04/17/24 (Ref# [id number] to 10/21)  SNF Insurance Authorization Status Approved   8:46 AM MD made aware that patient has received approval for SNF [facility]).

## 2024-04-18 NOTE — Progress Notes (Signed)
 Writer recalled facility and spoke with nurse Ami to give report on this patient.

## 2024-04-18 NOTE — TOC Transition Note (Signed)
 Transition of Care [facility] - Discharge Note   Patient Details  Name: Pamela [name] MRN: [id number] Date of Birth: 15-Jun-1935  [facility] CM/SW Contact:  Doneta Glenys DASEN, RN Phone Number: 04/18/2024, 12:23 PM  Clinical Narrative:    Geronimo MD patient ready for DC to [facility] . RN to call report prior to discharge [facility] [phone] Rm 107.). RN, patient, patient's family, and facility notified of DC. Discharge Summary and FL2 sent to facility via HUB. DC packet on chart includes face sheet, medical necessity, signed DNR and 2 prescriptions. Ambulance [facility] transport requested for patient.   CM will sign off Please consult us  again if new needs arise.     Final next level of care: Skilled Nursing Facility Barriers to Discharge: Barriers Resolved   Patient Goals and CMS Choice Patient states their goals for this hospitalization and ongoing recovery are:: To go to SNF for rehab CMS Medicare.gov Compare Post Acute Care list provided to:: Patient Choice offered to / list presented to : Patient, Spouse Nichols ownership interest in [facility].provided to:: Patient    Discharge Placement PASRR number recieved: 04/18/24            Patient chooses bed at: [facility] Patient to be transferred to facility by: [facility] Name of family member notified: Ryan Patient and family notified of of transfer: 04/18/24  Discharge Plan and Services Additional resources added to the After Visit Summary for   In-house Referral: Clinical Social Work Discharge Planning Services: NA Post Acute Care Choice: Skilled Nursing Facility          DME Arranged: N/A DME Agency: NA       HH Arranged: NA HH Agency: NA        Social Drivers of Health (SDOH) Interventions SDOH Screenings   Food Insecurity: No Food Insecurity (04/11/2024)  Housing: Low Risk  (04/11/2024)  Transportation Needs: No Transportation Needs (04/11/2024)  Utilities: Not At Risk  (04/11/2024)  Social Connections: Moderately Isolated (04/11/2024)  Tobacco Use: Medium Risk (04/10/2024)     Readmission Risk Interventions    04/04/2024    9:24 AM  Readmission Risk Prevention Plan  Transportation Screening Complete  HRI or Home Care Consult Complete  Social Work Consult for Recovery Care Planning/Counseling Complete  Palliative Care Screening Not Applicable  Medication Review Oceanographer) Complete

## 2024-04-22 ENCOUNTER — Ambulatory Visit: Admitting: Cardiovascular Disease

## 2024-04-29 ENCOUNTER — Telehealth: Payer: Self-pay | Admitting: Cardiovascular Disease

## 2024-04-29 NOTE — Telephone Encounter (Signed)
 Pt c/o medication issue:  1. Name of Medication:   apixaban  (ELIQUIS ) 2.5 MG TABS tablet   2. How are you currently taking this medication (dosage and times per day)?   3. Are you having a reaction (difficulty breathing--STAT)?   4. What is your medication issue?   Husband Artie) is following-up on patient's application to get this medication at a reduced rate,    Husband also noted he has a coupon from [employer] and will need a prescription sent to [id number] - [place], [place] - [address]  Husband noted patient only has 3 days left of this medication and wants to get samples if possible.

## 2024-05-01 DIAGNOSIS — R531 Weakness: Secondary | ICD-10-CM | POA: Diagnosis not present

## 2024-05-02 ENCOUNTER — Other Ambulatory Visit (HOSPITAL_COMMUNITY): Payer: Self-pay

## 2024-05-02 MED ORDER — APIXABAN 2.5 MG PO TABS: 2.5000 mg | ORAL_TABLET | Freq: Two times a day (BID) | ORAL | 5 refills | Status: AC

## 2024-05-02 NOTE — Telephone Encounter (Signed)
 Per separate encounter patient was approved for a healthwell grant to cover Eliquis . Lorrene info was provided to patient and pharmacy.

## 2024-05-17 ENCOUNTER — Ambulatory Visit: Admitting: Physician Assistant

## 2024-05-17 DIAGNOSIS — N184 Chronic kidney disease, stage 4 (severe): Secondary | ICD-10-CM | POA: Diagnosis not present

## 2024-05-17 DIAGNOSIS — E113291 Type 2 diabetes mellitus with mild nonproliferative diabetic retinopathy without macular edema, right eye: Secondary | ICD-10-CM | POA: Diagnosis not present

## 2024-05-17 DIAGNOSIS — M109 Gout, unspecified: Secondary | ICD-10-CM | POA: Diagnosis not present

## 2024-05-17 DIAGNOSIS — D6869 Other thrombophilia: Secondary | ICD-10-CM | POA: Diagnosis not present

## 2024-05-17 DIAGNOSIS — I130 Hypertensive heart and chronic kidney disease with heart failure and stage 1 through stage 4 chronic kidney disease, or unspecified chronic kidney disease: Secondary | ICD-10-CM | POA: Diagnosis not present

## 2024-05-17 DIAGNOSIS — F322 Major depressive disorder, single episode, severe without psychotic features: Secondary | ICD-10-CM | POA: Diagnosis not present

## 2024-05-17 DIAGNOSIS — M8580 Other specified disorders of bone density and structure, unspecified site: Secondary | ICD-10-CM | POA: Diagnosis not present

## 2024-05-17 DIAGNOSIS — M170 Bilateral primary osteoarthritis of knee: Secondary | ICD-10-CM | POA: Diagnosis not present

## 2024-05-17 DIAGNOSIS — D631 Anemia in chronic kidney disease: Secondary | ICD-10-CM | POA: Diagnosis not present

## 2024-05-17 DIAGNOSIS — I480 Paroxysmal atrial fibrillation: Secondary | ICD-10-CM | POA: Diagnosis not present

## 2024-05-17 DIAGNOSIS — I700 Atherosclerosis of aorta: Secondary | ICD-10-CM | POA: Diagnosis not present

## 2024-05-17 DIAGNOSIS — I2510 Atherosclerotic heart disease of native coronary artery without angina pectoris: Secondary | ICD-10-CM | POA: Diagnosis not present

## 2024-05-17 DIAGNOSIS — E1151 Type 2 diabetes mellitus with diabetic peripheral angiopathy without gangrene: Secondary | ICD-10-CM | POA: Diagnosis not present

## 2024-05-17 DIAGNOSIS — E782 Mixed hyperlipidemia: Secondary | ICD-10-CM | POA: Diagnosis not present

## 2024-05-17 DIAGNOSIS — F411 Generalized anxiety disorder: Secondary | ICD-10-CM | POA: Diagnosis not present

## 2024-05-17 DIAGNOSIS — J9611 Chronic respiratory failure with hypoxia: Secondary | ICD-10-CM | POA: Diagnosis not present

## 2024-05-17 DIAGNOSIS — E66813 Obesity, class 3: Secondary | ICD-10-CM | POA: Diagnosis not present

## 2024-05-17 DIAGNOSIS — K219 Gastro-esophageal reflux disease without esophagitis: Secondary | ICD-10-CM | POA: Diagnosis not present

## 2024-05-17 DIAGNOSIS — N2581 Secondary hyperparathyroidism of renal origin: Secondary | ICD-10-CM | POA: Diagnosis not present

## 2024-05-17 DIAGNOSIS — E1122 Type 2 diabetes mellitus with diabetic chronic kidney disease: Secondary | ICD-10-CM | POA: Diagnosis not present

## 2024-05-17 DIAGNOSIS — I872 Venous insufficiency (chronic) (peripheral): Secondary | ICD-10-CM | POA: Diagnosis not present

## 2024-05-17 DIAGNOSIS — I5042 Chronic combined systolic (congestive) and diastolic (congestive) heart failure: Secondary | ICD-10-CM | POA: Diagnosis not present

## 2024-05-17 DIAGNOSIS — K5730 Diverticulosis of large intestine without perforation or abscess without bleeding: Secondary | ICD-10-CM | POA: Diagnosis not present

## 2024-05-21 DIAGNOSIS — R2243 Localized swelling, mass and lump, lower limb, bilateral: Secondary | ICD-10-CM | POA: Diagnosis not present

## 2024-05-21 DIAGNOSIS — G4700 Insomnia, unspecified: Secondary | ICD-10-CM | POA: Diagnosis not present

## 2024-05-24 DIAGNOSIS — E113291 Type 2 diabetes mellitus with mild nonproliferative diabetic retinopathy without macular edema, right eye: Secondary | ICD-10-CM | POA: Diagnosis not present

## 2024-05-24 DIAGNOSIS — K5730 Diverticulosis of large intestine without perforation or abscess without bleeding: Secondary | ICD-10-CM | POA: Diagnosis not present

## 2024-05-24 DIAGNOSIS — M109 Gout, unspecified: Secondary | ICD-10-CM | POA: Diagnosis not present

## 2024-05-24 DIAGNOSIS — I5042 Chronic combined systolic (congestive) and diastolic (congestive) heart failure: Secondary | ICD-10-CM | POA: Diagnosis not present

## 2024-05-24 DIAGNOSIS — I130 Hypertensive heart and chronic kidney disease with heart failure and stage 1 through stage 4 chronic kidney disease, or unspecified chronic kidney disease: Secondary | ICD-10-CM | POA: Diagnosis not present

## 2024-05-24 DIAGNOSIS — E782 Mixed hyperlipidemia: Secondary | ICD-10-CM | POA: Diagnosis not present

## 2024-05-24 DIAGNOSIS — M170 Bilateral primary osteoarthritis of knee: Secondary | ICD-10-CM | POA: Diagnosis not present

## 2024-05-24 DIAGNOSIS — I2510 Atherosclerotic heart disease of native coronary artery without angina pectoris: Secondary | ICD-10-CM | POA: Diagnosis not present

## 2024-05-24 DIAGNOSIS — F411 Generalized anxiety disorder: Secondary | ICD-10-CM | POA: Diagnosis not present

## 2024-05-24 DIAGNOSIS — N2581 Secondary hyperparathyroidism of renal origin: Secondary | ICD-10-CM | POA: Diagnosis not present

## 2024-05-24 DIAGNOSIS — K219 Gastro-esophageal reflux disease without esophagitis: Secondary | ICD-10-CM | POA: Diagnosis not present

## 2024-05-24 DIAGNOSIS — E1151 Type 2 diabetes mellitus with diabetic peripheral angiopathy without gangrene: Secondary | ICD-10-CM | POA: Diagnosis not present

## 2024-05-24 DIAGNOSIS — I480 Paroxysmal atrial fibrillation: Secondary | ICD-10-CM | POA: Diagnosis not present

## 2024-05-24 DIAGNOSIS — F322 Major depressive disorder, single episode, severe without psychotic features: Secondary | ICD-10-CM | POA: Diagnosis not present

## 2024-05-24 DIAGNOSIS — E1122 Type 2 diabetes mellitus with diabetic chronic kidney disease: Secondary | ICD-10-CM | POA: Diagnosis not present

## 2024-05-24 DIAGNOSIS — N184 Chronic kidney disease, stage 4 (severe): Secondary | ICD-10-CM | POA: Diagnosis not present

## 2024-05-24 DIAGNOSIS — D631 Anemia in chronic kidney disease: Secondary | ICD-10-CM | POA: Diagnosis not present

## 2024-05-24 DIAGNOSIS — M8580 Other specified disorders of bone density and structure, unspecified site: Secondary | ICD-10-CM | POA: Diagnosis not present

## 2024-05-24 DIAGNOSIS — I872 Venous insufficiency (chronic) (peripheral): Secondary | ICD-10-CM | POA: Diagnosis not present

## 2024-05-24 DIAGNOSIS — I700 Atherosclerosis of aorta: Secondary | ICD-10-CM | POA: Diagnosis not present

## 2024-05-24 DIAGNOSIS — J9611 Chronic respiratory failure with hypoxia: Secondary | ICD-10-CM | POA: Diagnosis not present

## 2024-05-24 DIAGNOSIS — D6869 Other thrombophilia: Secondary | ICD-10-CM | POA: Diagnosis not present

## 2024-05-24 DIAGNOSIS — E66813 Obesity, class 3: Secondary | ICD-10-CM | POA: Diagnosis not present

## 2024-05-27 DIAGNOSIS — N2581 Secondary hyperparathyroidism of renal origin: Secondary | ICD-10-CM | POA: Diagnosis not present

## 2024-05-27 DIAGNOSIS — K219 Gastro-esophageal reflux disease without esophagitis: Secondary | ICD-10-CM | POA: Diagnosis not present

## 2024-05-27 DIAGNOSIS — I872 Venous insufficiency (chronic) (peripheral): Secondary | ICD-10-CM | POA: Diagnosis not present

## 2024-05-27 DIAGNOSIS — I5042 Chronic combined systolic (congestive) and diastolic (congestive) heart failure: Secondary | ICD-10-CM | POA: Diagnosis not present

## 2024-05-27 DIAGNOSIS — D631 Anemia in chronic kidney disease: Secondary | ICD-10-CM | POA: Diagnosis not present

## 2024-05-27 DIAGNOSIS — F322 Major depressive disorder, single episode, severe without psychotic features: Secondary | ICD-10-CM | POA: Diagnosis not present

## 2024-05-27 DIAGNOSIS — N184 Chronic kidney disease, stage 4 (severe): Secondary | ICD-10-CM | POA: Diagnosis not present

## 2024-05-27 DIAGNOSIS — D6869 Other thrombophilia: Secondary | ICD-10-CM | POA: Diagnosis not present

## 2024-05-27 DIAGNOSIS — I700 Atherosclerosis of aorta: Secondary | ICD-10-CM | POA: Diagnosis not present

## 2024-05-27 DIAGNOSIS — K5730 Diverticulosis of large intestine without perforation or abscess without bleeding: Secondary | ICD-10-CM | POA: Diagnosis not present

## 2024-05-27 DIAGNOSIS — J9611 Chronic respiratory failure with hypoxia: Secondary | ICD-10-CM | POA: Diagnosis not present

## 2024-05-27 DIAGNOSIS — I130 Hypertensive heart and chronic kidney disease with heart failure and stage 1 through stage 4 chronic kidney disease, or unspecified chronic kidney disease: Secondary | ICD-10-CM | POA: Diagnosis not present

## 2024-05-27 DIAGNOSIS — M109 Gout, unspecified: Secondary | ICD-10-CM | POA: Diagnosis not present

## 2024-05-27 DIAGNOSIS — E782 Mixed hyperlipidemia: Secondary | ICD-10-CM | POA: Diagnosis not present

## 2024-05-27 DIAGNOSIS — M8580 Other specified disorders of bone density and structure, unspecified site: Secondary | ICD-10-CM | POA: Diagnosis not present

## 2024-05-27 DIAGNOSIS — M170 Bilateral primary osteoarthritis of knee: Secondary | ICD-10-CM | POA: Diagnosis not present

## 2024-05-27 DIAGNOSIS — E1151 Type 2 diabetes mellitus with diabetic peripheral angiopathy without gangrene: Secondary | ICD-10-CM | POA: Diagnosis not present

## 2024-05-27 DIAGNOSIS — I480 Paroxysmal atrial fibrillation: Secondary | ICD-10-CM | POA: Diagnosis not present

## 2024-05-27 DIAGNOSIS — E1122 Type 2 diabetes mellitus with diabetic chronic kidney disease: Secondary | ICD-10-CM | POA: Diagnosis not present

## 2024-05-27 DIAGNOSIS — F411 Generalized anxiety disorder: Secondary | ICD-10-CM | POA: Diagnosis not present

## 2024-05-27 DIAGNOSIS — E113291 Type 2 diabetes mellitus with mild nonproliferative diabetic retinopathy without macular edema, right eye: Secondary | ICD-10-CM | POA: Diagnosis not present

## 2024-05-27 DIAGNOSIS — I2510 Atherosclerotic heart disease of native coronary artery without angina pectoris: Secondary | ICD-10-CM | POA: Diagnosis not present

## 2024-05-27 DIAGNOSIS — E66813 Obesity, class 3: Secondary | ICD-10-CM | POA: Diagnosis not present

## 2024-06-03 DIAGNOSIS — K219 Gastro-esophageal reflux disease without esophagitis: Secondary | ICD-10-CM | POA: Diagnosis not present

## 2024-06-03 DIAGNOSIS — F322 Major depressive disorder, single episode, severe without psychotic features: Secondary | ICD-10-CM | POA: Diagnosis not present

## 2024-06-03 DIAGNOSIS — N184 Chronic kidney disease, stage 4 (severe): Secondary | ICD-10-CM | POA: Diagnosis not present

## 2024-06-03 DIAGNOSIS — I700 Atherosclerosis of aorta: Secondary | ICD-10-CM | POA: Diagnosis not present

## 2024-06-03 DIAGNOSIS — E782 Mixed hyperlipidemia: Secondary | ICD-10-CM | POA: Diagnosis not present

## 2024-06-03 DIAGNOSIS — I872 Venous insufficiency (chronic) (peripheral): Secondary | ICD-10-CM | POA: Diagnosis not present

## 2024-06-03 DIAGNOSIS — J9611 Chronic respiratory failure with hypoxia: Secondary | ICD-10-CM | POA: Diagnosis not present

## 2024-06-03 DIAGNOSIS — E1151 Type 2 diabetes mellitus with diabetic peripheral angiopathy without gangrene: Secondary | ICD-10-CM | POA: Diagnosis not present

## 2024-06-03 DIAGNOSIS — F411 Generalized anxiety disorder: Secondary | ICD-10-CM | POA: Diagnosis not present

## 2024-06-03 DIAGNOSIS — I5042 Chronic combined systolic (congestive) and diastolic (congestive) heart failure: Secondary | ICD-10-CM | POA: Diagnosis not present

## 2024-06-03 DIAGNOSIS — M109 Gout, unspecified: Secondary | ICD-10-CM | POA: Diagnosis not present

## 2024-06-03 DIAGNOSIS — I130 Hypertensive heart and chronic kidney disease with heart failure and stage 1 through stage 4 chronic kidney disease, or unspecified chronic kidney disease: Secondary | ICD-10-CM | POA: Diagnosis not present

## 2024-06-03 DIAGNOSIS — N2581 Secondary hyperparathyroidism of renal origin: Secondary | ICD-10-CM | POA: Diagnosis not present

## 2024-06-03 DIAGNOSIS — I480 Paroxysmal atrial fibrillation: Secondary | ICD-10-CM | POA: Diagnosis not present

## 2024-06-03 DIAGNOSIS — E113291 Type 2 diabetes mellitus with mild nonproliferative diabetic retinopathy without macular edema, right eye: Secondary | ICD-10-CM | POA: Diagnosis not present

## 2024-06-03 DIAGNOSIS — D631 Anemia in chronic kidney disease: Secondary | ICD-10-CM | POA: Diagnosis not present

## 2024-06-03 DIAGNOSIS — D6869 Other thrombophilia: Secondary | ICD-10-CM | POA: Diagnosis not present

## 2024-06-03 DIAGNOSIS — K5730 Diverticulosis of large intestine without perforation or abscess without bleeding: Secondary | ICD-10-CM | POA: Diagnosis not present

## 2024-06-03 DIAGNOSIS — M170 Bilateral primary osteoarthritis of knee: Secondary | ICD-10-CM | POA: Diagnosis not present

## 2024-06-03 DIAGNOSIS — M8580 Other specified disorders of bone density and structure, unspecified site: Secondary | ICD-10-CM | POA: Diagnosis not present

## 2024-06-03 DIAGNOSIS — E1122 Type 2 diabetes mellitus with diabetic chronic kidney disease: Secondary | ICD-10-CM | POA: Diagnosis not present

## 2024-06-03 DIAGNOSIS — E66813 Obesity, class 3: Secondary | ICD-10-CM | POA: Diagnosis not present

## 2024-06-03 DIAGNOSIS — I2510 Atherosclerotic heart disease of native coronary artery without angina pectoris: Secondary | ICD-10-CM | POA: Diagnosis not present

## 2024-06-04 DIAGNOSIS — M8580 Other specified disorders of bone density and structure, unspecified site: Secondary | ICD-10-CM | POA: Diagnosis not present

## 2024-06-04 DIAGNOSIS — F411 Generalized anxiety disorder: Secondary | ICD-10-CM | POA: Diagnosis not present

## 2024-06-04 DIAGNOSIS — N184 Chronic kidney disease, stage 4 (severe): Secondary | ICD-10-CM | POA: Diagnosis not present

## 2024-06-04 DIAGNOSIS — M109 Gout, unspecified: Secondary | ICD-10-CM | POA: Diagnosis not present

## 2024-06-04 DIAGNOSIS — K5730 Diverticulosis of large intestine without perforation or abscess without bleeding: Secondary | ICD-10-CM | POA: Diagnosis not present

## 2024-06-04 DIAGNOSIS — I2510 Atherosclerotic heart disease of native coronary artery without angina pectoris: Secondary | ICD-10-CM | POA: Diagnosis not present

## 2024-06-04 DIAGNOSIS — I130 Hypertensive heart and chronic kidney disease with heart failure and stage 1 through stage 4 chronic kidney disease, or unspecified chronic kidney disease: Secondary | ICD-10-CM | POA: Diagnosis not present

## 2024-06-04 DIAGNOSIS — N2581 Secondary hyperparathyroidism of renal origin: Secondary | ICD-10-CM | POA: Diagnosis not present

## 2024-06-04 DIAGNOSIS — I480 Paroxysmal atrial fibrillation: Secondary | ICD-10-CM | POA: Diagnosis not present

## 2024-06-04 DIAGNOSIS — E66813 Obesity, class 3: Secondary | ICD-10-CM | POA: Diagnosis not present

## 2024-06-04 DIAGNOSIS — I700 Atherosclerosis of aorta: Secondary | ICD-10-CM | POA: Diagnosis not present

## 2024-06-04 DIAGNOSIS — E1122 Type 2 diabetes mellitus with diabetic chronic kidney disease: Secondary | ICD-10-CM | POA: Diagnosis not present

## 2024-06-04 DIAGNOSIS — D6869 Other thrombophilia: Secondary | ICD-10-CM | POA: Diagnosis not present

## 2024-06-04 DIAGNOSIS — E782 Mixed hyperlipidemia: Secondary | ICD-10-CM | POA: Diagnosis not present

## 2024-06-04 DIAGNOSIS — J9611 Chronic respiratory failure with hypoxia: Secondary | ICD-10-CM | POA: Diagnosis not present

## 2024-06-04 DIAGNOSIS — F322 Major depressive disorder, single episode, severe without psychotic features: Secondary | ICD-10-CM | POA: Diagnosis not present

## 2024-06-04 DIAGNOSIS — D631 Anemia in chronic kidney disease: Secondary | ICD-10-CM | POA: Diagnosis not present

## 2024-06-04 DIAGNOSIS — K219 Gastro-esophageal reflux disease without esophagitis: Secondary | ICD-10-CM | POA: Diagnosis not present

## 2024-06-04 DIAGNOSIS — E1151 Type 2 diabetes mellitus with diabetic peripheral angiopathy without gangrene: Secondary | ICD-10-CM | POA: Diagnosis not present

## 2024-06-04 DIAGNOSIS — M170 Bilateral primary osteoarthritis of knee: Secondary | ICD-10-CM | POA: Diagnosis not present

## 2024-06-04 DIAGNOSIS — I872 Venous insufficiency (chronic) (peripheral): Secondary | ICD-10-CM | POA: Diagnosis not present

## 2024-06-04 DIAGNOSIS — I5042 Chronic combined systolic (congestive) and diastolic (congestive) heart failure: Secondary | ICD-10-CM | POA: Diagnosis not present

## 2024-06-04 DIAGNOSIS — E113291 Type 2 diabetes mellitus with mild nonproliferative diabetic retinopathy without macular edema, right eye: Secondary | ICD-10-CM | POA: Diagnosis not present

## 2024-06-12 DIAGNOSIS — D6869 Other thrombophilia: Secondary | ICD-10-CM | POA: Diagnosis not present

## 2024-06-12 DIAGNOSIS — I872 Venous insufficiency (chronic) (peripheral): Secondary | ICD-10-CM | POA: Diagnosis not present

## 2024-06-12 DIAGNOSIS — D631 Anemia in chronic kidney disease: Secondary | ICD-10-CM | POA: Diagnosis not present

## 2024-06-12 DIAGNOSIS — E782 Mixed hyperlipidemia: Secondary | ICD-10-CM | POA: Diagnosis not present

## 2024-06-12 DIAGNOSIS — E113291 Type 2 diabetes mellitus with mild nonproliferative diabetic retinopathy without macular edema, right eye: Secondary | ICD-10-CM | POA: Diagnosis not present

## 2024-06-12 DIAGNOSIS — N184 Chronic kidney disease, stage 4 (severe): Secondary | ICD-10-CM | POA: Diagnosis not present

## 2024-06-12 DIAGNOSIS — E66813 Obesity, class 3: Secondary | ICD-10-CM | POA: Diagnosis not present

## 2024-06-12 DIAGNOSIS — F411 Generalized anxiety disorder: Secondary | ICD-10-CM | POA: Diagnosis not present

## 2024-06-12 DIAGNOSIS — I2510 Atherosclerotic heart disease of native coronary artery without angina pectoris: Secondary | ICD-10-CM | POA: Diagnosis not present

## 2024-06-12 DIAGNOSIS — I480 Paroxysmal atrial fibrillation: Secondary | ICD-10-CM | POA: Diagnosis not present

## 2024-06-12 DIAGNOSIS — J9611 Chronic respiratory failure with hypoxia: Secondary | ICD-10-CM | POA: Diagnosis not present

## 2024-06-12 DIAGNOSIS — E1151 Type 2 diabetes mellitus with diabetic peripheral angiopathy without gangrene: Secondary | ICD-10-CM | POA: Diagnosis not present

## 2024-06-12 DIAGNOSIS — F322 Major depressive disorder, single episode, severe without psychotic features: Secondary | ICD-10-CM | POA: Diagnosis not present

## 2024-06-12 DIAGNOSIS — I700 Atherosclerosis of aorta: Secondary | ICD-10-CM | POA: Diagnosis not present

## 2024-06-12 DIAGNOSIS — M109 Gout, unspecified: Secondary | ICD-10-CM | POA: Diagnosis not present

## 2024-06-12 DIAGNOSIS — N2581 Secondary hyperparathyroidism of renal origin: Secondary | ICD-10-CM | POA: Diagnosis not present

## 2024-06-12 DIAGNOSIS — I5042 Chronic combined systolic (congestive) and diastolic (congestive) heart failure: Secondary | ICD-10-CM | POA: Diagnosis not present

## 2024-06-12 DIAGNOSIS — M8580 Other specified disorders of bone density and structure, unspecified site: Secondary | ICD-10-CM | POA: Diagnosis not present

## 2024-06-12 DIAGNOSIS — K5730 Diverticulosis of large intestine without perforation or abscess without bleeding: Secondary | ICD-10-CM | POA: Diagnosis not present

## 2024-06-12 DIAGNOSIS — M170 Bilateral primary osteoarthritis of knee: Secondary | ICD-10-CM | POA: Diagnosis not present

## 2024-06-12 DIAGNOSIS — K219 Gastro-esophageal reflux disease without esophagitis: Secondary | ICD-10-CM | POA: Diagnosis not present

## 2024-06-12 DIAGNOSIS — E1122 Type 2 diabetes mellitus with diabetic chronic kidney disease: Secondary | ICD-10-CM | POA: Diagnosis not present

## 2024-06-12 DIAGNOSIS — I130 Hypertensive heart and chronic kidney disease with heart failure and stage 1 through stage 4 chronic kidney disease, or unspecified chronic kidney disease: Secondary | ICD-10-CM | POA: Diagnosis not present

## 2024-06-13 DIAGNOSIS — E66813 Obesity, class 3: Secondary | ICD-10-CM | POA: Diagnosis not present

## 2024-06-13 DIAGNOSIS — I130 Hypertensive heart and chronic kidney disease with heart failure and stage 1 through stage 4 chronic kidney disease, or unspecified chronic kidney disease: Secondary | ICD-10-CM | POA: Diagnosis not present

## 2024-06-13 DIAGNOSIS — M8580 Other specified disorders of bone density and structure, unspecified site: Secondary | ICD-10-CM | POA: Diagnosis not present

## 2024-06-13 DIAGNOSIS — N2581 Secondary hyperparathyroidism of renal origin: Secondary | ICD-10-CM | POA: Diagnosis not present

## 2024-06-13 DIAGNOSIS — D6869 Other thrombophilia: Secondary | ICD-10-CM | POA: Diagnosis not present

## 2024-06-13 DIAGNOSIS — E113291 Type 2 diabetes mellitus with mild nonproliferative diabetic retinopathy without macular edema, right eye: Secondary | ICD-10-CM | POA: Diagnosis not present

## 2024-06-13 DIAGNOSIS — I700 Atherosclerosis of aorta: Secondary | ICD-10-CM | POA: Diagnosis not present

## 2024-06-13 DIAGNOSIS — E1122 Type 2 diabetes mellitus with diabetic chronic kidney disease: Secondary | ICD-10-CM | POA: Diagnosis not present

## 2024-06-13 DIAGNOSIS — D631 Anemia in chronic kidney disease: Secondary | ICD-10-CM | POA: Diagnosis not present

## 2024-06-13 DIAGNOSIS — M109 Gout, unspecified: Secondary | ICD-10-CM | POA: Diagnosis not present

## 2024-06-13 DIAGNOSIS — I5042 Chronic combined systolic (congestive) and diastolic (congestive) heart failure: Secondary | ICD-10-CM | POA: Diagnosis not present

## 2024-06-13 DIAGNOSIS — F322 Major depressive disorder, single episode, severe without psychotic features: Secondary | ICD-10-CM | POA: Diagnosis not present

## 2024-06-13 DIAGNOSIS — I480 Paroxysmal atrial fibrillation: Secondary | ICD-10-CM | POA: Diagnosis not present

## 2024-06-13 DIAGNOSIS — M170 Bilateral primary osteoarthritis of knee: Secondary | ICD-10-CM | POA: Diagnosis not present

## 2024-06-13 DIAGNOSIS — J9611 Chronic respiratory failure with hypoxia: Secondary | ICD-10-CM | POA: Diagnosis not present

## 2024-06-13 DIAGNOSIS — I872 Venous insufficiency (chronic) (peripheral): Secondary | ICD-10-CM | POA: Diagnosis not present

## 2024-06-13 DIAGNOSIS — K5730 Diverticulosis of large intestine without perforation or abscess without bleeding: Secondary | ICD-10-CM | POA: Diagnosis not present

## 2024-06-13 DIAGNOSIS — F411 Generalized anxiety disorder: Secondary | ICD-10-CM | POA: Diagnosis not present

## 2024-06-13 DIAGNOSIS — K219 Gastro-esophageal reflux disease without esophagitis: Secondary | ICD-10-CM | POA: Diagnosis not present

## 2024-06-13 DIAGNOSIS — E782 Mixed hyperlipidemia: Secondary | ICD-10-CM | POA: Diagnosis not present

## 2024-06-13 DIAGNOSIS — E1151 Type 2 diabetes mellitus with diabetic peripheral angiopathy without gangrene: Secondary | ICD-10-CM | POA: Diagnosis not present

## 2024-06-13 DIAGNOSIS — I2510 Atherosclerotic heart disease of native coronary artery without angina pectoris: Secondary | ICD-10-CM | POA: Diagnosis not present

## 2024-06-13 DIAGNOSIS — N184 Chronic kidney disease, stage 4 (severe): Secondary | ICD-10-CM | POA: Diagnosis not present

## 2024-08-07 ENCOUNTER — Other Ambulatory Visit (HOSPITAL_BASED_OUTPATIENT_CLINIC_OR_DEPARTMENT_OTHER): Payer: Self-pay

## 2024-08-07 DIAGNOSIS — R197 Diarrhea, unspecified: Secondary | ICD-10-CM

## 2024-08-12 ENCOUNTER — Ambulatory Visit (HOSPITAL_BASED_OUTPATIENT_CLINIC_OR_DEPARTMENT_OTHER)
